# Patient Record
Sex: Female | Born: 1955 | Race: Black or African American | Hispanic: No | Marital: Single | Smoking: Former smoker
Health system: Southern US, Community
[De-identification: ages and names within clinical notes are randomized; demographics above are authoritative.]

## PROBLEM LIST (undated history)

## (undated) DIAGNOSIS — Z21 Asymptomatic human immunodeficiency virus [HIV] infection status: Secondary | ICD-10-CM

## (undated) DIAGNOSIS — F1911 Other psychoactive substance abuse, in remission: Secondary | ICD-10-CM

## (undated) DIAGNOSIS — F32A Depression, unspecified: Secondary | ICD-10-CM

## (undated) DIAGNOSIS — K759 Inflammatory liver disease, unspecified: Secondary | ICD-10-CM

## (undated) DIAGNOSIS — F419 Anxiety disorder, unspecified: Secondary | ICD-10-CM

## (undated) DIAGNOSIS — A599 Trichomoniasis, unspecified: Secondary | ICD-10-CM

## (undated) DIAGNOSIS — G629 Polyneuropathy, unspecified: Secondary | ICD-10-CM

## (undated) DIAGNOSIS — Z8659 Personal history of other mental and behavioral disorders: Secondary | ICD-10-CM

## (undated) DIAGNOSIS — H6123 Impacted cerumen, bilateral: Secondary | ICD-10-CM

## (undated) DIAGNOSIS — F329 Major depressive disorder, single episode, unspecified: Secondary | ICD-10-CM

## (undated) DIAGNOSIS — J40 Bronchitis, not specified as acute or chronic: Secondary | ICD-10-CM

## (undated) DIAGNOSIS — G479 Sleep disorder, unspecified: Secondary | ICD-10-CM

## (undated) DIAGNOSIS — I10 Essential (primary) hypertension: Secondary | ICD-10-CM

## (undated) DIAGNOSIS — B20 Human immunodeficiency virus [HIV] disease: Secondary | ICD-10-CM

## (undated) HISTORY — DX: Human immunodeficiency virus (HIV) disease: B20

## (undated) HISTORY — DX: Asymptomatic human immunodeficiency virus (hiv) infection status: Z21

## (undated) HISTORY — DX: Depression, unspecified: F32.A

## (undated) HISTORY — DX: Bronchitis, not specified as acute or chronic: J40

## (undated) HISTORY — DX: Sleep disorder, unspecified: G47.9

## (undated) HISTORY — DX: Essential (primary) hypertension: I10

## (undated) HISTORY — DX: Anxiety disorder, unspecified: F41.9

## (undated) HISTORY — DX: Other psychoactive substance abuse, in remission: F19.11

## (undated) HISTORY — DX: Major depressive disorder, single episode, unspecified: F32.9

## (undated) HISTORY — DX: Personal history of other mental and behavioral disorders: Z86.59

## (undated) HISTORY — DX: Trichomoniasis, unspecified: A59.9

## (undated) HISTORY — DX: Polyneuropathy, unspecified: G62.9

---
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
## 2001-02-14 ENCOUNTER — Emergency Department (HOSPITAL_COMMUNITY): Admission: EM | Admit: 2001-02-14 | Discharge: 2001-02-14 | Payer: Self-pay | Admitting: Emergency Medicine

## 2003-08-11 ENCOUNTER — Inpatient Hospital Stay (HOSPITAL_COMMUNITY): Admission: AD | Admit: 2003-08-11 | Discharge: 2003-08-15 | Payer: Self-pay | Admitting: *Deleted

## 2003-08-23 ENCOUNTER — Encounter: Admission: RE | Admit: 2003-08-23 | Discharge: 2003-08-23 | Payer: Self-pay | Admitting: Internal Medicine

## 2003-08-24 ENCOUNTER — Encounter: Admission: RE | Admit: 2003-08-24 | Discharge: 2003-08-24 | Payer: Self-pay | Admitting: Internal Medicine

## 2003-10-24 ENCOUNTER — Ambulatory Visit: Payer: Self-pay

## 2003-11-16 ENCOUNTER — Ambulatory Visit: Payer: Self-pay | Admitting: Infectious Diseases

## 2003-12-27 ENCOUNTER — Ambulatory Visit (HOSPITAL_COMMUNITY): Admission: RE | Admit: 2003-12-27 | Discharge: 2003-12-27 | Payer: Self-pay | Admitting: Infectious Diseases

## 2003-12-27 ENCOUNTER — Ambulatory Visit: Payer: Self-pay | Admitting: Infectious Diseases

## 2004-05-31 ENCOUNTER — Emergency Department (HOSPITAL_COMMUNITY): Admission: EM | Admit: 2004-05-31 | Discharge: 2004-05-31 | Payer: Self-pay | Admitting: Emergency Medicine

## 2004-06-23 ENCOUNTER — Ambulatory Visit (HOSPITAL_COMMUNITY): Admission: RE | Admit: 2004-06-23 | Discharge: 2004-06-23 | Payer: Self-pay | Admitting: Infectious Diseases

## 2004-06-23 ENCOUNTER — Encounter (INDEPENDENT_AMBULATORY_CARE_PROVIDER_SITE_OTHER): Payer: Self-pay | Admitting: *Deleted

## 2004-06-23 ENCOUNTER — Ambulatory Visit: Payer: Self-pay | Admitting: Infectious Diseases

## 2004-06-23 LAB — CONVERTED CEMR LAB
CD4 Count: 70 microliters
CD4 T Cell Abs: 70

## 2004-07-17 ENCOUNTER — Emergency Department (HOSPITAL_COMMUNITY): Admission: EM | Admit: 2004-07-17 | Discharge: 2004-07-17 | Payer: Self-pay | Admitting: Family Medicine

## 2004-09-20 ENCOUNTER — Emergency Department (HOSPITAL_COMMUNITY): Admission: EM | Admit: 2004-09-20 | Discharge: 2004-09-20 | Payer: Self-pay | Admitting: Family Medicine

## 2005-01-12 ENCOUNTER — Ambulatory Visit: Payer: Self-pay | Admitting: Infectious Diseases

## 2005-01-12 ENCOUNTER — Encounter (INDEPENDENT_AMBULATORY_CARE_PROVIDER_SITE_OTHER): Payer: Self-pay | Admitting: *Deleted

## 2005-01-12 ENCOUNTER — Ambulatory Visit (HOSPITAL_COMMUNITY): Admission: RE | Admit: 2005-01-12 | Discharge: 2005-01-12 | Payer: Self-pay | Admitting: Infectious Diseases

## 2005-01-12 LAB — CONVERTED CEMR LAB
CD4 Count: 170 microliters
HIV 1 RNA Quant: 399 copies/mL

## 2005-10-09 ENCOUNTER — Emergency Department (HOSPITAL_COMMUNITY): Admission: EM | Admit: 2005-10-09 | Discharge: 2005-10-09 | Payer: Self-pay | Admitting: Family Medicine

## 2005-12-23 DIAGNOSIS — I10 Essential (primary) hypertension: Secondary | ICD-10-CM

## 2005-12-23 DIAGNOSIS — Z8709 Personal history of other diseases of the respiratory system: Secondary | ICD-10-CM | POA: Insufficient documentation

## 2005-12-23 DIAGNOSIS — B20 Human immunodeficiency virus [HIV] disease: Secondary | ICD-10-CM | POA: Insufficient documentation

## 2005-12-23 DIAGNOSIS — B182 Chronic viral hepatitis C: Secondary | ICD-10-CM | POA: Insufficient documentation

## 2005-12-28 ENCOUNTER — Emergency Department (HOSPITAL_COMMUNITY): Admission: EM | Admit: 2005-12-28 | Discharge: 2005-12-29 | Payer: Self-pay | Admitting: Emergency Medicine

## 2006-04-12 ENCOUNTER — Encounter (INDEPENDENT_AMBULATORY_CARE_PROVIDER_SITE_OTHER): Payer: Self-pay | Admitting: *Deleted

## 2006-04-12 LAB — CONVERTED CEMR LAB

## 2006-04-25 ENCOUNTER — Encounter (INDEPENDENT_AMBULATORY_CARE_PROVIDER_SITE_OTHER): Payer: Self-pay | Admitting: *Deleted

## 2006-12-31 ENCOUNTER — Ambulatory Visit: Payer: Self-pay | Admitting: Internal Medicine

## 2006-12-31 ENCOUNTER — Inpatient Hospital Stay (HOSPITAL_COMMUNITY): Admission: EM | Admit: 2006-12-31 | Discharge: 2007-01-02 | Payer: Self-pay | Admitting: Emergency Medicine

## 2006-12-31 ENCOUNTER — Ambulatory Visit: Payer: Self-pay | Admitting: Emergency Medicine

## 2007-01-03 ENCOUNTER — Ambulatory Visit: Payer: Self-pay | Admitting: Infectious Diseases

## 2007-01-03 ENCOUNTER — Encounter: Admission: RE | Admit: 2007-01-03 | Discharge: 2007-01-03 | Payer: Self-pay | Admitting: Infectious Diseases

## 2007-01-03 LAB — CONVERTED CEMR LAB
BUN: 27 mg/dL — ABNORMAL HIGH (ref 6–23)
Basophils Absolute: 0 10*3/uL (ref 0.0–0.1)
Basophils Relative: 1 % (ref 0–1)
CO2: 14 meq/L — ABNORMAL LOW (ref 19–32)
Calcium: 8.9 mg/dL (ref 8.4–10.5)
Chloride: 115 meq/L — ABNORMAL HIGH (ref 96–112)
Creatinine, Ser: 1.11 mg/dL (ref 0.40–1.20)
Eosinophils Absolute: 0.1 10*3/uL — ABNORMAL LOW (ref 0.2–0.7)
Eosinophils Relative: 3 % (ref 0–5)
Glucose, Bld: 85 mg/dL (ref 70–99)
HCT: 25.3 % — ABNORMAL LOW (ref 36.0–46.0)
HCV Ab: POSITIVE
Hemoglobin: 8.5 g/dL — ABNORMAL LOW (ref 12.0–15.0)
Lymphocytes Relative: 23 % (ref 12–46)
Lymphs Abs: 1 10*3/uL (ref 0.7–4.0)
MCHC: 33.6 g/dL (ref 30.0–36.0)
MCV: 95.5 fL (ref 78.0–100.0)
Monocytes Absolute: 0.7 10*3/uL (ref 0.1–1.0)
Monocytes Relative: 16 % — ABNORMAL HIGH (ref 3–12)
Neutro Abs: 2.4 10*3/uL (ref 1.7–7.7)
Neutrophils Relative %: 58 % (ref 43–77)
Platelets: 284 10*3/uL (ref 150–400)
Potassium: 4.3 meq/L (ref 3.5–5.3)
RBC: 2.65 M/uL — ABNORMAL LOW (ref 3.87–5.11)
RDW: 22 % — ABNORMAL HIGH (ref 11.5–15.5)
Sodium: 140 meq/L (ref 135–145)
WBC: 4.2 10*3/uL (ref 4.0–10.5)

## 2007-01-04 ENCOUNTER — Encounter (INDEPENDENT_AMBULATORY_CARE_PROVIDER_SITE_OTHER): Payer: Self-pay | Admitting: *Deleted

## 2007-01-04 ENCOUNTER — Telehealth: Payer: Self-pay | Admitting: Infectious Diseases

## 2007-01-04 DIAGNOSIS — D649 Anemia, unspecified: Secondary | ICD-10-CM | POA: Insufficient documentation

## 2007-01-31 ENCOUNTER — Encounter (INDEPENDENT_AMBULATORY_CARE_PROVIDER_SITE_OTHER): Payer: Self-pay | Admitting: *Deleted

## 2007-02-02 ENCOUNTER — Ambulatory Visit: Payer: Self-pay | Admitting: Infectious Diseases

## 2007-02-14 ENCOUNTER — Encounter (INDEPENDENT_AMBULATORY_CARE_PROVIDER_SITE_OTHER): Payer: Self-pay | Admitting: *Deleted

## 2007-02-15 ENCOUNTER — Encounter (INDEPENDENT_AMBULATORY_CARE_PROVIDER_SITE_OTHER): Payer: Self-pay | Admitting: *Deleted

## 2007-02-16 ENCOUNTER — Telehealth: Payer: Self-pay | Admitting: Infectious Diseases

## 2007-02-16 ENCOUNTER — Ambulatory Visit: Payer: Self-pay | Admitting: Internal Medicine

## 2007-02-16 DIAGNOSIS — L659 Nonscarring hair loss, unspecified: Secondary | ICD-10-CM | POA: Insufficient documentation

## 2007-02-16 DIAGNOSIS — L299 Pruritus, unspecified: Secondary | ICD-10-CM | POA: Insufficient documentation

## 2007-02-21 ENCOUNTER — Encounter: Payer: Self-pay | Admitting: Internal Medicine

## 2007-02-21 LAB — CONVERTED CEMR LAB
Basophils Absolute: 0 10*3/uL (ref 0.0–0.1)
Basophils Relative: 1 % (ref 0–1)
Eosinophils Absolute: 0.1 10*3/uL (ref 0.0–0.7)
Eosinophils Relative: 2 % (ref 0–5)
HCT: 32.9 % — ABNORMAL LOW (ref 36.0–46.0)
Hemoglobin: 11 g/dL — ABNORMAL LOW (ref 12.0–15.0)
Lymphocytes Relative: 24 % (ref 12–46)
Lymphs Abs: 1.3 10*3/uL (ref 0.7–4.0)
MCHC: 33.4 g/dL (ref 30.0–36.0)
MCV: 95.6 fL (ref 78.0–100.0)
Monocytes Absolute: 0.7 10*3/uL (ref 0.1–1.0)
Monocytes Relative: 13 % — ABNORMAL HIGH (ref 3–12)
Neutro Abs: 3.3 10*3/uL (ref 1.7–7.7)
Neutrophils Relative %: 61 % (ref 43–77)
Platelets: 202 10*3/uL (ref 150–400)
RBC: 3.44 M/uL — ABNORMAL LOW (ref 3.87–5.11)
RDW: 14.3 % (ref 11.5–15.5)
TSH: 2.103 microintl units/mL (ref 0.350–5.50)
WBC: 5.4 10*3/uL (ref 4.0–10.5)

## 2007-03-21 ENCOUNTER — Ambulatory Visit: Payer: Self-pay | Admitting: Infectious Diseases

## 2007-03-21 ENCOUNTER — Encounter: Admission: RE | Admit: 2007-03-21 | Discharge: 2007-03-21 | Payer: Self-pay | Admitting: Infectious Diseases

## 2007-03-21 LAB — CONVERTED CEMR LAB
ALT: 48 units/L — ABNORMAL HIGH (ref 0–35)
AST: 47 units/L — ABNORMAL HIGH (ref 0–37)
Albumin: 3.7 g/dL (ref 3.5–5.2)
Alkaline Phosphatase: 141 units/L — ABNORMAL HIGH (ref 39–117)
BUN: 38 mg/dL — ABNORMAL HIGH (ref 6–23)
CO2: 16 meq/L — ABNORMAL LOW (ref 19–32)
Calcium: 9.1 mg/dL (ref 8.4–10.5)
Chloride: 111 meq/L (ref 96–112)
Cholesterol: 183 mg/dL (ref 0–200)
Creatinine, Ser: 1.4 mg/dL — ABNORMAL HIGH (ref 0.40–1.20)
Glucose, Bld: 105 mg/dL — ABNORMAL HIGH (ref 70–99)
HCT: 28.9 % — ABNORMAL LOW (ref 36.0–46.0)
HDL: 61 mg/dL (ref >39)
HIV 1 RNA Quant: 430 copies/mL — ABNORMAL HIGH (ref <50)
HIV-1 RNA Quant, Log: 2.63 — ABNORMAL HIGH (ref <1.70)
Hemoglobin: 9.3 g/dL — ABNORMAL LOW (ref 12.0–15.0)
Hep A Total Ab: POSITIVE — AB
LDL Cholesterol: 75 mg/dL (ref 0–99)
MCHC: 32.2 g/dL (ref 30.0–36.0)
MCV: 92.9 fL (ref 78.0–100.0)
Platelets: 238 10*3/uL (ref 150–400)
Potassium: 4.6 meq/L (ref 3.5–5.3)
RBC: 3.11 M/uL — ABNORMAL LOW (ref 3.87–5.11)
RDW: 14.2 % (ref 11.5–15.5)
Sodium: 137 meq/L (ref 135–145)
Total Bilirubin: 0.5 mg/dL (ref 0.3–1.2)
Total CHOL/HDL Ratio: 3
Total Protein: 7.5 g/dL (ref 6.0–8.3)
Triglycerides: 234 mg/dL — ABNORMAL HIGH (ref <150)
VLDL: 47 mg/dL — ABNORMAL HIGH (ref 0–40)
WBC: 4.4 10*3/uL (ref 4.0–10.5)

## 2007-04-08 ENCOUNTER — Encounter (INDEPENDENT_AMBULATORY_CARE_PROVIDER_SITE_OTHER): Payer: Self-pay | Admitting: *Deleted

## 2007-06-06 ENCOUNTER — Ambulatory Visit: Payer: Self-pay | Admitting: Infectious Diseases

## 2007-06-06 ENCOUNTER — Encounter: Admission: RE | Admit: 2007-06-06 | Discharge: 2007-06-06 | Payer: Self-pay | Admitting: Infectious Diseases

## 2007-06-06 LAB — CONVERTED CEMR LAB
ALT: 52 units/L — ABNORMAL HIGH (ref 0–35)
AST: 61 units/L — ABNORMAL HIGH (ref 0–37)
Albumin: 3.9 g/dL (ref 3.5–5.2)
Alkaline Phosphatase: 156 units/L — ABNORMAL HIGH (ref 39–117)
BUN: 41 mg/dL — ABNORMAL HIGH (ref 6–23)
Basophils Absolute: 0 10*3/uL (ref 0.0–0.1)
Basophils Relative: 1 % (ref 0–1)
CO2: 12 meq/L — ABNORMAL LOW (ref 19–32)
Calcium: 8.7 mg/dL (ref 8.4–10.5)
Chloride: 111 meq/L (ref 96–112)
Creatinine, Ser: 1.62 mg/dL — ABNORMAL HIGH (ref 0.40–1.20)
Eosinophils Absolute: 0.1 10*3/uL (ref 0.0–0.7)
Eosinophils Relative: 1 % (ref 0–5)
Glucose, Bld: 139 mg/dL — ABNORMAL HIGH (ref 70–99)
HCT: 34.3 % — ABNORMAL LOW (ref 36.0–46.0)
HIV 1 RNA Quant: <50 copies/mL (ref <50)
HIV-1 RNA Quant, Log: <1.7 (ref <1.70)
Hemoglobin: 11.3 g/dL — ABNORMAL LOW (ref 12.0–15.0)
Lymphocytes Relative: 21 % (ref 12–46)
Lymphs Abs: 1.3 10*3/uL (ref 0.7–4.0)
MCHC: 32.9 g/dL (ref 30.0–36.0)
MCV: 93 fL (ref 78.0–100.0)
Monocytes Absolute: 0.5 10*3/uL (ref 0.1–1.0)
Monocytes Relative: 9 % (ref 3–12)
Neutro Abs: 4.1 10*3/uL (ref 1.7–7.7)
Neutrophils Relative %: 68 % (ref 43–77)
Platelets: 266 10*3/uL (ref 150–400)
Potassium: 4.3 meq/L (ref 3.5–5.3)
RBC: 3.69 M/uL — ABNORMAL LOW (ref 3.87–5.11)
RDW: 15.6 % — ABNORMAL HIGH (ref 11.5–15.5)
Sodium: 135 meq/L (ref 135–145)
Total Bilirubin: 0.4 mg/dL (ref 0.3–1.2)
Total Protein: 7.6 g/dL (ref 6.0–8.3)
WBC: 6 10*3/uL (ref 4.0–10.5)

## 2007-06-21 ENCOUNTER — Ambulatory Visit: Payer: Self-pay | Admitting: Infectious Diseases

## 2007-06-21 DIAGNOSIS — N898 Other specified noninflammatory disorders of vagina: Secondary | ICD-10-CM | POA: Insufficient documentation

## 2007-07-05 ENCOUNTER — Encounter: Payer: Self-pay | Admitting: Infectious Diseases

## 2007-07-18 ENCOUNTER — Encounter: Payer: Self-pay | Admitting: Infectious Diseases

## 2007-09-26 ENCOUNTER — Encounter: Admission: RE | Admit: 2007-09-26 | Discharge: 2007-09-26 | Payer: Self-pay | Admitting: Infectious Diseases

## 2007-09-26 ENCOUNTER — Ambulatory Visit: Payer: Self-pay | Admitting: Infectious Diseases

## 2007-09-26 LAB — CONVERTED CEMR LAB
ALT: 45 units/L — ABNORMAL HIGH (ref 0–35)
AST: 58 units/L — ABNORMAL HIGH (ref 0–37)
Albumin: 4 g/dL (ref 3.5–5.2)
Alkaline Phosphatase: 197 units/L — ABNORMAL HIGH (ref 39–117)
BUN: 19 mg/dL (ref 6–23)
CO2: 14 meq/L — ABNORMAL LOW (ref 19–32)
Calcium: 9.5 mg/dL (ref 8.4–10.5)
Chloride: 109 meq/L (ref 96–112)
Creatinine, Ser: 1.16 mg/dL (ref 0.40–1.20)
Glucose, Bld: 85 mg/dL (ref 70–99)
HCT: 38.9 % (ref 36.0–46.0)
HIV 1 RNA Quant: 151 {copies}/mL — ABNORMAL HIGH
HIV-1 RNA Quant, Log: 2.18 — ABNORMAL HIGH
Hemoglobin: 13 g/dL (ref 12.0–15.0)
MCHC: 33.4 g/dL (ref 30.0–36.0)
MCV: 100 fL (ref 78.0–100.0)
Platelets: 248 10*3/uL (ref 150–400)
Potassium: 3.9 meq/L (ref 3.5–5.3)
RBC: 3.89 M/uL (ref 3.87–5.11)
RDW: 12.7 % (ref 11.5–15.5)
Sodium: 137 meq/L (ref 135–145)
Total Bilirubin: 0.6 mg/dL (ref 0.3–1.2)
Total Protein: 8.1 g/dL (ref 6.0–8.3)
WBC: 6.8 10*3/uL (ref 4.0–10.5)

## 2007-11-28 ENCOUNTER — Telehealth: Payer: Self-pay | Admitting: Internal Medicine

## 2007-11-30 ENCOUNTER — Ambulatory Visit: Payer: Self-pay | Admitting: Infectious Diseases

## 2007-11-30 LAB — CONVERTED CEMR LAB
ALT: 40 units/L — ABNORMAL HIGH (ref 0–35)
AST: 50 units/L — ABNORMAL HIGH (ref 0–37)
Albumin: 4.2 g/dL (ref 3.5–5.2)
Alkaline Phosphatase: 177 units/L — ABNORMAL HIGH (ref 39–117)
BUN: 23 mg/dL (ref 6–23)
CO2: 16 meq/L — ABNORMAL LOW (ref 19–32)
Calcium: 9.4 mg/dL (ref 8.4–10.5)
Chloride: 106 meq/L (ref 96–112)
Creatinine, Ser: 1.14 mg/dL (ref 0.40–1.20)
Glucose, Bld: 104 mg/dL — ABNORMAL HIGH (ref 70–99)
HIV 1 RNA Quant: 182 copies/mL — ABNORMAL HIGH (ref <50)
HIV-1 RNA Quant, Log: 2.26 — ABNORMAL HIGH (ref <1.70)
Potassium: 4.3 meq/L (ref 3.5–5.3)
Sodium: 138 meq/L (ref 135–145)
Total Bilirubin: 0.5 mg/dL (ref 0.3–1.2)
Total Protein: 8 g/dL (ref 6.0–8.3)

## 2007-12-12 ENCOUNTER — Ambulatory Visit: Payer: Self-pay | Admitting: Infectious Diseases

## 2007-12-12 DIAGNOSIS — M549 Dorsalgia, unspecified: Secondary | ICD-10-CM

## 2007-12-12 DIAGNOSIS — G8929 Other chronic pain: Secondary | ICD-10-CM

## 2007-12-12 LAB — CONVERTED CEMR LAB
Bilirubin Urine: NEGATIVE
Candida species: NEGATIVE
Chlamydia, Swab/Urine, PCR: NEGATIVE
GC Probe Amp, Urine: NEGATIVE
Gardnerella vaginalis: NEGATIVE
Hemoglobin, Urine: NEGATIVE
Ketones, ur: NEGATIVE mg/dL
Nitrite: NEGATIVE
Protein, ur: 30 mg/dL — AB
RBC / HPF: NONE SEEN (ref <3)
Specific Gravity, Urine: 1.023 (ref 1.005–1.03)
Trichomonal Vaginitis: POSITIVE — AB
Urine Glucose: NEGATIVE mg/dL
Urobilinogen, UA: 1 (ref 0.0–1.0)
pH: 6 (ref 5.0–8.0)

## 2007-12-14 ENCOUNTER — Telehealth: Payer: Self-pay | Admitting: Infectious Diseases

## 2007-12-15 ENCOUNTER — Telehealth: Payer: Self-pay | Admitting: Infectious Diseases

## 2007-12-20 ENCOUNTER — Ambulatory Visit (HOSPITAL_COMMUNITY): Admission: RE | Admit: 2007-12-20 | Discharge: 2007-12-20 | Payer: Self-pay | Admitting: Infectious Diseases

## 2007-12-29 ENCOUNTER — Telehealth (INDEPENDENT_AMBULATORY_CARE_PROVIDER_SITE_OTHER): Payer: Self-pay | Admitting: *Deleted

## 2007-12-30 ENCOUNTER — Ambulatory Visit: Payer: Self-pay | Admitting: Internal Medicine

## 2007-12-30 DIAGNOSIS — J209 Acute bronchitis, unspecified: Secondary | ICD-10-CM

## 2008-01-16 ENCOUNTER — Encounter: Payer: Self-pay | Admitting: Licensed Clinical Social Worker

## 2008-01-16 ENCOUNTER — Ambulatory Visit: Payer: Self-pay | Admitting: Infectious Diseases

## 2008-02-17 HISTORY — PX: COLPOSCOPY: SHX161

## 2008-02-21 IMAGING — CT CT HEAD W/O CM
1 of 2 series · 13 of 30 positions shown, 17 images · IV contrast (agent unspecified)
Comparison: None.

CLINICAL DATA: 50-year-old with headache and nosebleeds.
 HEAD CT WITHOUT CONTRAST:
TECHNIQUE: Contiguous axial images were obtained from the base of the skull through the vertex according to standard protocol without contrast.

[Series 2: brain · axial · 0.47mm/px · z∈[+136,+258]mm · 13 of 28 slices shown, 17 images]
[im 2/28  brain]
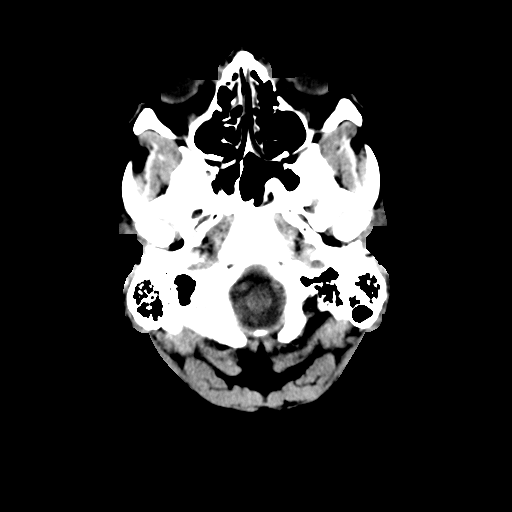
[im 2/28  bone]
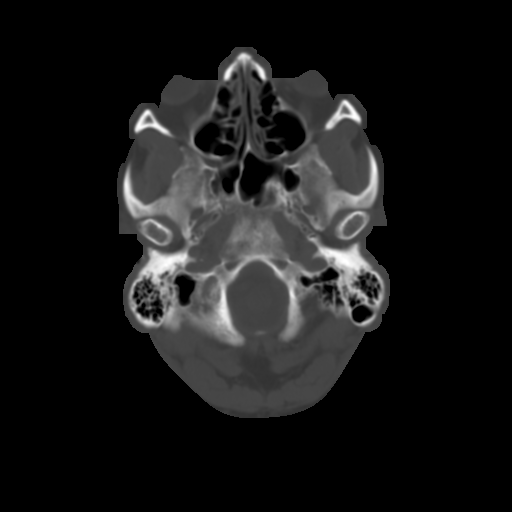
[im 4/28  brain]
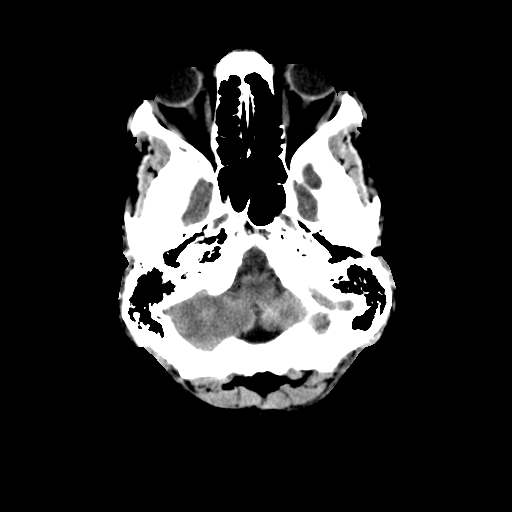
[im 6/28  brain]
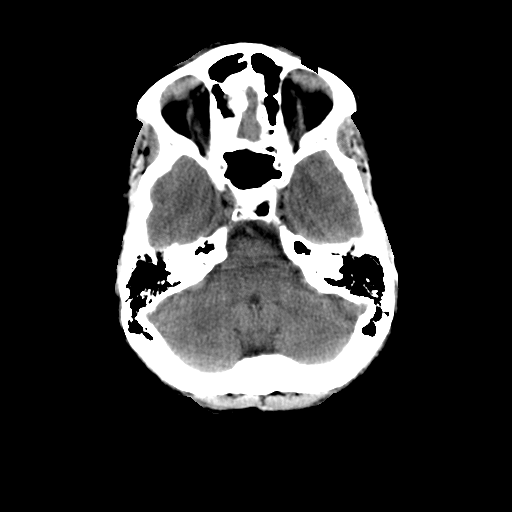
[im 8/28  brain]
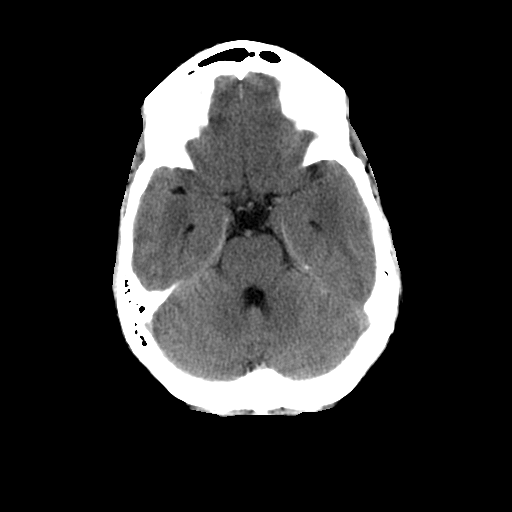
[im 10/28  brain]
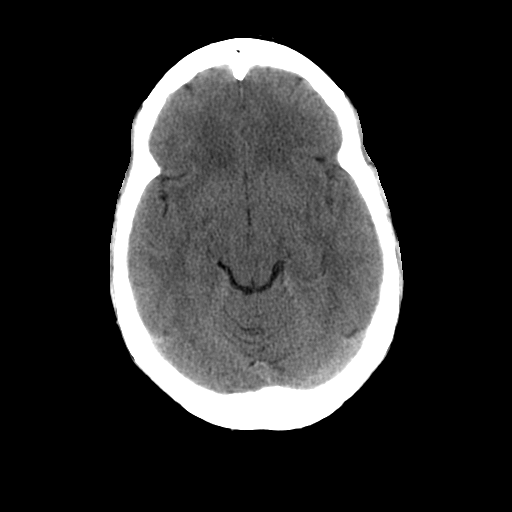
[im 10/28  bone]
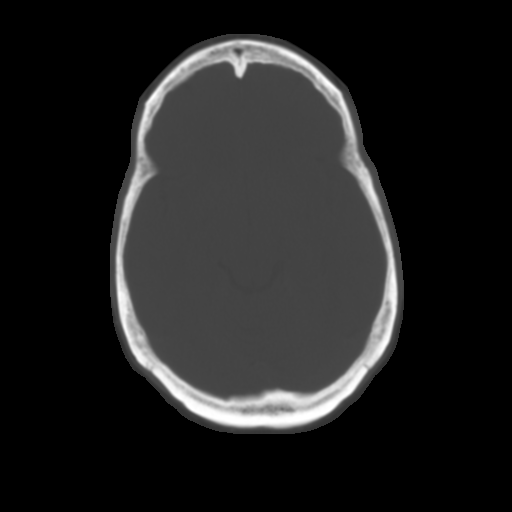
[im 12/28  brain]
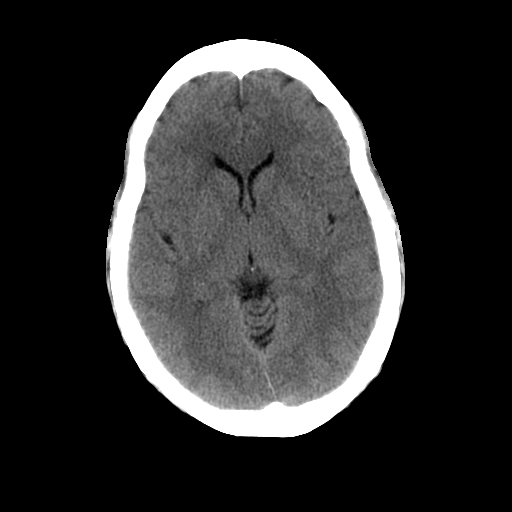
[im 14/28  brain]
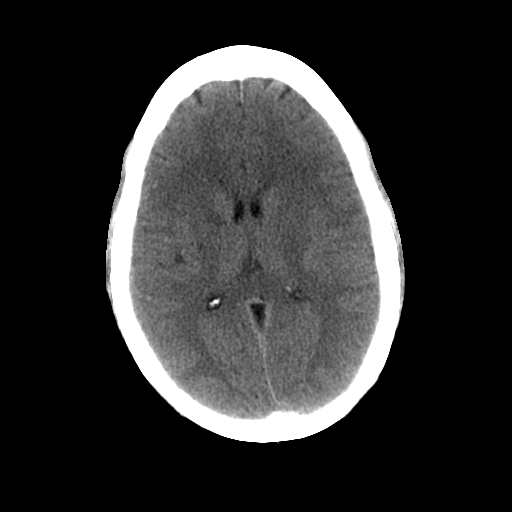
[im 16/28  brain]
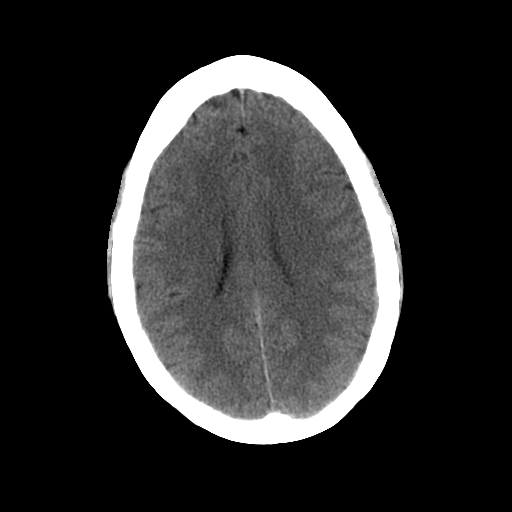
[im 18/28  brain]
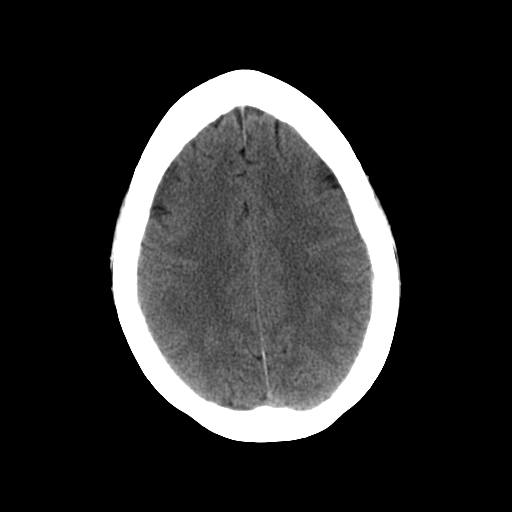
[im 18/28  bone]
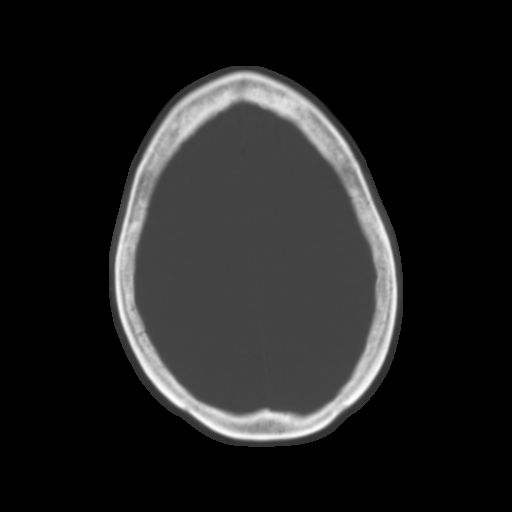
[im 20/28  brain]
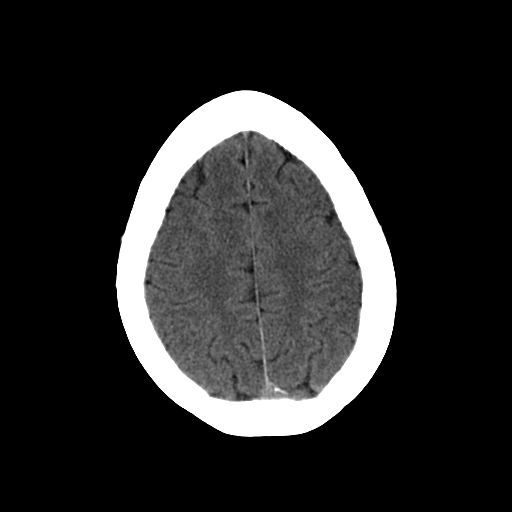
[im 22/28  brain]
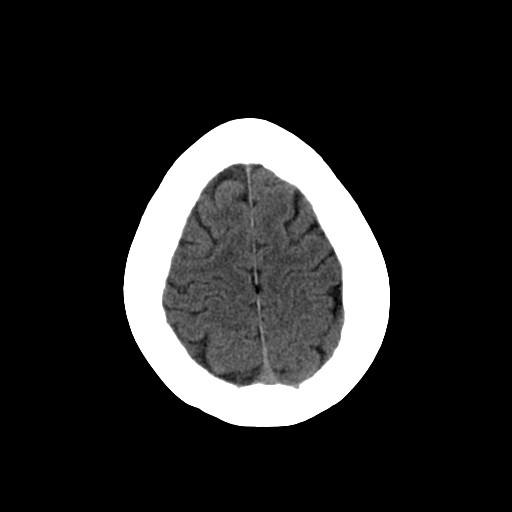
[im 24/28  brain]
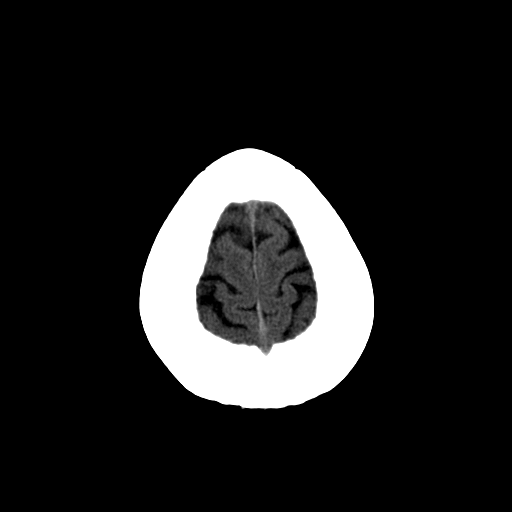
[im 26/28  brain]
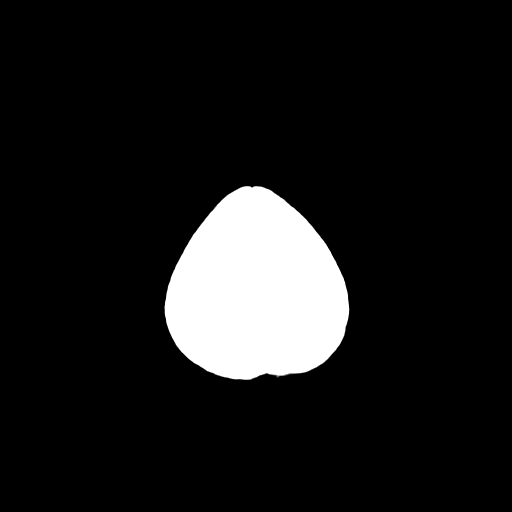
[im 26/28  bone]
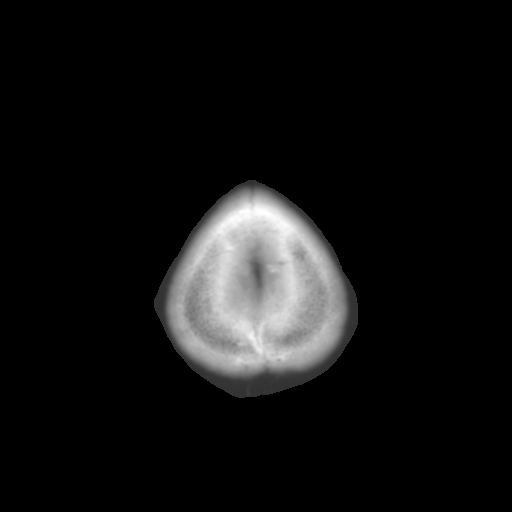

[13 of 30 positions shown; findings below may reference images not displayed]

FINDINGS: There is no evidence of intracranial hemorrhage, brain edema, acute infarct, mass lesion, or mass effect.  No other intra-axial abnormalities are seen, and the ventricles are within normal limits.  No abnormal extra-axial fluid collections or masses are identified.  No skull abnormalities are noted.
IMPRESSION: Negative non-contrast head CT.

## 2008-04-10 ENCOUNTER — Telehealth: Payer: Self-pay | Admitting: Infectious Diseases

## 2008-04-10 ENCOUNTER — Ambulatory Visit: Payer: Self-pay | Admitting: Infectious Diseases

## 2008-04-16 ENCOUNTER — Encounter (INDEPENDENT_AMBULATORY_CARE_PROVIDER_SITE_OTHER): Payer: Self-pay | Admitting: *Deleted

## 2008-04-16 ENCOUNTER — Ambulatory Visit: Payer: Self-pay | Admitting: Infectious Diseases

## 2008-04-16 DIAGNOSIS — K029 Dental caries, unspecified: Secondary | ICD-10-CM | POA: Insufficient documentation

## 2008-05-09 ENCOUNTER — Encounter (INDEPENDENT_AMBULATORY_CARE_PROVIDER_SITE_OTHER): Payer: Self-pay | Admitting: *Deleted

## 2008-11-30 ENCOUNTER — Ambulatory Visit: Payer: Self-pay | Admitting: Internal Medicine

## 2008-11-30 ENCOUNTER — Encounter: Payer: Self-pay | Admitting: Infectious Diseases

## 2008-12-10 ENCOUNTER — Telehealth: Payer: Self-pay | Admitting: Infectious Diseases

## 2008-12-10 ENCOUNTER — Ambulatory Visit: Payer: Self-pay | Admitting: Infectious Diseases

## 2008-12-10 LAB — CONVERTED CEMR LAB
ALT: 58 units/L — ABNORMAL HIGH (ref 0–35)
AST: 87 units/L — ABNORMAL HIGH (ref 0–37)
Albumin: 3.6 g/dL (ref 3.5–5.2)
Alkaline Phosphatase: 140 units/L — ABNORMAL HIGH (ref 39–117)
BUN: 24 mg/dL — ABNORMAL HIGH (ref 6–23)
Basophils Absolute: 0 10*3/uL (ref 0.0–0.1)
Basophils Relative: 1 % (ref 0–1)
CO2: 18 meq/L — ABNORMAL LOW (ref 19–32)
Calcium: 9.2 mg/dL (ref 8.4–10.5)
Chloride: 113 meq/L — ABNORMAL HIGH (ref 96–112)
Cholesterol: 156 mg/dL (ref 0–200)
Creatinine, Ser: 1.07 mg/dL (ref 0.40–1.20)
Eosinophils Absolute: 0.1 10*3/uL (ref 0.0–0.7)
Eosinophils Relative: 2 % (ref 0–5)
Glucose, Bld: 116 mg/dL — ABNORMAL HIGH (ref 70–99)
HCT: 36.8 % (ref 36.0–46.0)
HDL: 50 mg/dL (ref >39)
HIV 1 RNA Quant: 480 copies/mL — ABNORMAL HIGH (ref <48)
HIV-1 RNA Quant, Log: 2.68 — ABNORMAL HIGH (ref <1.68)
Hemoglobin: 12.2 g/dL (ref 12.0–15.0)
LDL Cholesterol: 49 mg/dL (ref 0–99)
Lymphocytes Relative: 24 % (ref 12–46)
Lymphs Abs: 1.4 10*3/uL (ref 0.7–4.0)
MCHC: 33.2 g/dL (ref 30.0–36.0)
MCV: 100.8 fL — ABNORMAL HIGH (ref >=78.0)
Monocytes Absolute: 0.6 10*3/uL (ref 0.1–1.0)
Monocytes Relative: 10 % (ref 3–12)
Neutro Abs: 3.7 10*3/uL (ref 1.7–7.7)
Neutrophils Relative %: 64 % (ref 43–77)
Platelets: 216 10*3/uL (ref 150–400)
Potassium: 4.4 meq/L (ref 3.5–5.3)
RBC: 3.65 M/uL — ABNORMAL LOW (ref 3.87–5.11)
RDW: 12.6 % (ref 11.5–15.5)
Sodium: 141 meq/L (ref 135–145)
Total Bilirubin: 0.4 mg/dL (ref 0.3–1.2)
Total CHOL/HDL Ratio: 3.1
Total Protein: 6.9 g/dL (ref 6.0–8.3)
Triglycerides: 287 mg/dL — ABNORMAL HIGH (ref <150)
VLDL: 57 mg/dL — ABNORMAL HIGH (ref 0–40)
WBC: 5.8 10*3/uL (ref 4.0–10.5)

## 2008-12-14 ENCOUNTER — Encounter: Payer: Self-pay | Admitting: Infectious Diseases

## 2009-01-03 ENCOUNTER — Telehealth (INDEPENDENT_AMBULATORY_CARE_PROVIDER_SITE_OTHER): Payer: Self-pay | Admitting: *Deleted

## 2009-01-15 ENCOUNTER — Ambulatory Visit: Payer: Self-pay | Admitting: Infectious Diseases

## 2009-01-15 DIAGNOSIS — R87619 Unspecified abnormal cytological findings in specimens from cervix uteri: Secondary | ICD-10-CM | POA: Insufficient documentation

## 2009-01-15 DIAGNOSIS — G4700 Insomnia, unspecified: Secondary | ICD-10-CM | POA: Insufficient documentation

## 2009-01-17 ENCOUNTER — Encounter (INDEPENDENT_AMBULATORY_CARE_PROVIDER_SITE_OTHER): Payer: Self-pay | Admitting: *Deleted

## 2009-01-17 ENCOUNTER — Encounter: Payer: Self-pay | Admitting: Infectious Diseases

## 2009-01-17 ENCOUNTER — Ambulatory Visit: Payer: Self-pay | Admitting: Obstetrics & Gynecology

## 2009-01-17 ENCOUNTER — Other Ambulatory Visit: Admission: RE | Admit: 2009-01-17 | Discharge: 2009-01-17 | Payer: Self-pay | Admitting: Obstetrics & Gynecology

## 2009-01-17 LAB — CONVERTED CEMR LAB

## 2009-01-24 ENCOUNTER — Encounter: Payer: Self-pay | Admitting: Infectious Diseases

## 2009-01-30 ENCOUNTER — Telehealth (INDEPENDENT_AMBULATORY_CARE_PROVIDER_SITE_OTHER): Payer: Self-pay | Admitting: *Deleted

## 2009-02-20 ENCOUNTER — Telehealth: Payer: Self-pay | Admitting: Infectious Diseases

## 2009-03-07 ENCOUNTER — Ambulatory Visit: Payer: Self-pay | Admitting: Infectious Diseases

## 2009-03-07 LAB — CONVERTED CEMR LAB
ALT: 42 units/L — ABNORMAL HIGH (ref 0–35)
AST: 53 units/L — ABNORMAL HIGH (ref 0–37)
Albumin: 4.2 g/dL (ref 3.5–5.2)
Alkaline Phosphatase: 157 units/L — ABNORMAL HIGH (ref 39–117)
BUN: 23 mg/dL (ref 6–23)
Basophils Absolute: 0.1 10*3/uL (ref 0.0–0.1)
Basophils Relative: 1 % (ref 0–1)
CO2: 18 meq/L — ABNORMAL LOW (ref 19–32)
Calcium: 9 mg/dL (ref 8.4–10.5)
Chloride: 106 meq/L (ref 96–112)
Creatinine, Ser: 1.14 mg/dL (ref 0.40–1.20)
Eosinophils Absolute: 0 10*3/uL (ref 0.0–0.7)
Eosinophils Relative: 0 % (ref 0–5)
Glucose, Bld: 117 mg/dL — ABNORMAL HIGH (ref 70–99)
HCT: 40.5 % (ref 36.0–46.0)
HIV 1 RNA Quant: 239 copies/mL — ABNORMAL HIGH (ref <48)
HIV-1 RNA Quant, Log: 2.38 — ABNORMAL HIGH (ref <1.68)
Hemoglobin: 13.7 g/dL (ref 12.0–15.0)
Lymphocytes Relative: 16 % (ref 12–46)
Lymphs Abs: 1.4 10*3/uL (ref 0.7–4.0)
MCHC: 33.8 g/dL (ref 30.0–36.0)
MCV: 94.2 fL (ref >=78.0)
Monocytes Absolute: 0.6 10*3/uL (ref 0.1–1.0)
Monocytes Relative: 7 % (ref 3–12)
Neutro Abs: 6.3 10*3/uL (ref 1.7–7.7)
Neutrophils Relative %: 76 % (ref 43–77)
Platelets: 221 10*3/uL (ref 150–400)
Potassium: 4.1 meq/L (ref 3.5–5.3)
RBC: 4.3 M/uL (ref 3.87–5.11)
RDW: 13 % (ref 11.5–15.5)
Sodium: 138 meq/L (ref 135–145)
Total Bilirubin: 0.7 mg/dL (ref 0.3–1.2)
Total Protein: 8.8 g/dL — ABNORMAL HIGH (ref 6.0–8.3)
WBC: 8.3 10*3/uL (ref 4.0–10.5)

## 2009-03-29 ENCOUNTER — Telehealth (INDEPENDENT_AMBULATORY_CARE_PROVIDER_SITE_OTHER): Payer: Self-pay | Admitting: *Deleted

## 2009-04-02 ENCOUNTER — Ambulatory Visit: Payer: Self-pay | Admitting: Infectious Diseases

## 2009-04-02 DIAGNOSIS — G629 Polyneuropathy, unspecified: Secondary | ICD-10-CM

## 2009-04-02 LAB — CONVERTED CEMR LAB
HCT: 37.1 % (ref 36.0–46.0)
Hemoglobin: 12.8 g/dL (ref 12.0–15.0)
MCHC: 34.5 g/dL (ref 30.0–36.0)
MCV: 96.1 fL (ref >=78.0)
Platelets: 188 10*3/uL (ref 150–400)
RBC Folate: 311 ng/mL (ref 180–600)
RBC: 3.86 M/uL — ABNORMAL LOW (ref 3.87–5.11)
RDW: 14.5 % (ref 11.5–15.5)
TSH: 1.423 microintl units/mL (ref 0.350–4.5)
Vitamin B-12: 663 pg/mL (ref 211–911)
WBC: 6.6 10*3/uL (ref 4.0–10.5)

## 2009-04-23 ENCOUNTER — Encounter (INDEPENDENT_AMBULATORY_CARE_PROVIDER_SITE_OTHER): Payer: Self-pay | Admitting: *Deleted

## 2009-07-09 ENCOUNTER — Ambulatory Visit: Payer: Self-pay | Admitting: Infectious Diseases

## 2009-07-09 LAB — CONVERTED CEMR LAB
ALT: 78 units/L — ABNORMAL HIGH (ref 0–35)
AST: 162 units/L — ABNORMAL HIGH (ref 0–37)
Albumin: 4 g/dL (ref 3.5–5.2)
Alkaline Phosphatase: 142 units/L — ABNORMAL HIGH (ref 39–117)
BUN: 22 mg/dL (ref 6–23)
Basophils Absolute: 0 10*3/uL (ref 0.0–0.1)
Basophils Relative: 0 % (ref 0–1)
CO2: 20 meq/L (ref 19–32)
Calcium: 9 mg/dL (ref 8.4–10.5)
Chloride: 108 meq/L (ref 96–112)
Creatinine, Ser: 1.48 mg/dL — ABNORMAL HIGH (ref 0.40–1.20)
Eosinophils Absolute: 0.1 10*3/uL (ref 0.0–0.7)
Eosinophils Relative: 1 % (ref 0–5)
Glucose, Bld: 118 mg/dL — ABNORMAL HIGH (ref 70–99)
HCT: 36.4 % (ref 36.0–46.0)
HIV 1 RNA Quant: <48 copies/mL (ref <48)
HIV-1 RNA Quant, Log: <1.68 (ref <1.68)
Hemoglobin: 12.1 g/dL (ref 12.0–15.0)
Lymphocytes Relative: 24 % (ref 12–46)
Lymphs Abs: 1.8 10*3/uL (ref 0.7–4.0)
MCHC: 33.2 g/dL (ref 30.0–36.0)
MCV: 100.6 fL — ABNORMAL HIGH (ref 78.0–100.0)
Monocytes Absolute: 0.4 10*3/uL (ref 0.1–1.0)
Monocytes Relative: 5 % (ref 3–12)
Neutro Abs: 5.1 10*3/uL (ref 1.7–7.7)
Neutrophils Relative %: 69 % (ref 43–77)
Platelets: 257 10*3/uL (ref 150–400)
Potassium: 4.3 meq/L (ref 3.5–5.3)
RBC: 3.62 M/uL — ABNORMAL LOW (ref 3.87–5.11)
RDW: 14.3 % (ref 11.5–15.5)
Sodium: 140 meq/L (ref 135–145)
Total Bilirubin: 0.4 mg/dL (ref 0.3–1.2)
Total Protein: 7.2 g/dL (ref 6.0–8.3)
WBC: 7.5 10*3/uL (ref 4.0–10.5)

## 2009-07-10 ENCOUNTER — Telehealth (INDEPENDENT_AMBULATORY_CARE_PROVIDER_SITE_OTHER): Payer: Self-pay | Admitting: *Deleted

## 2009-07-30 ENCOUNTER — Encounter (INDEPENDENT_AMBULATORY_CARE_PROVIDER_SITE_OTHER): Payer: Self-pay | Admitting: *Deleted

## 2009-07-30 ENCOUNTER — Ambulatory Visit: Payer: Self-pay | Admitting: Infectious Diseases

## 2009-08-14 ENCOUNTER — Encounter (INDEPENDENT_AMBULATORY_CARE_PROVIDER_SITE_OTHER): Payer: Self-pay | Admitting: *Deleted

## 2009-10-02 ENCOUNTER — Ambulatory Visit: Payer: Self-pay | Admitting: Infectious Diseases

## 2009-10-18 ENCOUNTER — Telehealth: Payer: Self-pay | Admitting: Infectious Diseases

## 2009-10-28 ENCOUNTER — Ambulatory Visit: Payer: Self-pay | Admitting: Infectious Diseases

## 2009-10-28 DIAGNOSIS — IMO0001 Reserved for inherently not codable concepts without codable children: Secondary | ICD-10-CM | POA: Insufficient documentation

## 2009-10-28 LAB — CONVERTED CEMR LAB
ALT: 51 units/L — ABNORMAL HIGH (ref 0–35)
AST: 54 units/L — ABNORMAL HIGH (ref 0–37)
Anti Nuclear Antibody(ANA): NEGATIVE
Basophils Absolute: 0 10*3/uL (ref 0.0–0.1)
CO2: 15 meq/L — ABNORMAL LOW (ref 19–32)
CRP: 0 mg/dL (ref <0.6)
Eosinophils Absolute: 0.1 10*3/uL (ref 0.0–0.7)
HCV Quantitative: 1060000 intl units/mL — ABNORMAL HIGH (ref <43)
HIV 1 RNA Quant: <20 copies/mL (ref <20)
HIV-1 RNA Quant, Log: <1.3 (ref <1.30)
Hep B S Ab: NEGATIVE
Lymphocytes Relative: 32 % (ref 12–46)
Lymphs Abs: 2.3 10*3/uL (ref 0.7–4.0)
Neutrophils Relative %: 59 % (ref 43–77)
Platelets: 223 10*3/uL (ref 150–400)
RDW: 13.8 % (ref 11.5–15.5)
Sed Rate: 48 mm/hr — ABNORMAL HIGH (ref 0–22)
Sodium: 141 meq/L (ref 135–145)
Total Bilirubin: 0.5 mg/dL (ref 0.3–1.2)
Total CK: 96 units/L (ref 7–177)
Total Protein: 8 g/dL (ref 6.0–8.3)
WBC: 7.4 10*3/uL (ref 4.0–10.5)

## 2009-12-27 ENCOUNTER — Encounter: Payer: Self-pay | Admitting: Infectious Diseases

## 2009-12-30 ENCOUNTER — Ambulatory Visit: Payer: Self-pay | Admitting: Infectious Diseases

## 2010-01-06 ENCOUNTER — Encounter: Payer: Self-pay | Admitting: Infectious Diseases

## 2010-03-18 NOTE — Miscellaneous (Signed)
Summary: 01/17/2010 Colpo results, ASCUS   Clinical Lists Changes  Observations: Added new observation of PAP SMEAR: ASCUS biopsy (01/17/2009 16:36) Added new observation of LAST PAP DAT: 01/17/2009 (01/17/2009 16:36)

## 2010-03-18 NOTE — Assessment & Plan Note (Signed)
Summary: 51MONTH F/U/VS   CC:  2 month follow  / patient feeling lumps under skin on both arms.  History of Present Illness: 55 yo F with hx of HIV+ since 1994. Was previously using IVDA and was previously incarcerated (d/c 12-09-06).  CD4 230,  VL <48 (07-09-09).   AST 53 -> 162 ALT 42 ->78 ALk P 157 -> 142 Cr 1.14 -> 1.48 Currently taking EFV/EPZ.  Had PAP abn in November- had [facility] 01-17-09. had f/u June 2011: ASCUS. No plan for colpo that she knows of.  Here with DA form. No problems with medicine. Wants refill for sleeping med.   Preventive Screening-Counseling & Management  Alcohol-Tobacco     Alcohol drinks/day: 0     Alcohol type: occas., beer     Smoking Status: quit     Packs/Day: occ     Year Quit: 2005     Passive Smoke Exposure: yes  Caffeine-Diet-Exercise     Caffeine use/day: 0     Does Patient Exercise: no     Type of exercise: walking     Times/week: 7  Safety-Violence-Falls     Seat Belt Use: yes   Updated Prior Medication List: HYDROCHLOROTHIAZIDE 25 MG TABS (HYDROCHLOROTHIAZIDE) Take 1/2 tablet by mouth once a day CLONIDINE HCL 0.1 MG  TABS (CLONIDINE HCL) Take 1 tablet by mouth two times a day LISINOPRIL 30 MG  TABS (LISINOPRIL) Take 1 tablet by mouth once a day EPZICOM 600-300 MG  TABS (ABACAVIR SULFATE-LAMIVUDINE) Take 1 tablet by mouth once a day SUSTIVA 600 MG  TABS (EFAVIRENZ) Take 1 tablet by mouth at bedtime ZOLOFT 50 MG TABS (SERTRALINE HCL) 1 qd AMBIEN 5 MG TABS (ZOLPIDEM TARTRATE) Take 1 tablet by mouth at bedtime as needed insomnia  Current Allergies (reviewed today): No known allergies  Past History:  Past medical, surgical, family and social histories (including risk factors) reviewed, and no changes noted (except as noted below).  Past Medical History: Reviewed history from 04/02/2009 and no changes required. HIV disease Neuropathy Insomnia Hepatitis C Hypertension Pneumonia, hx of, non-PCP  Family History: Reviewed  history from 06/21/2007 and no changes required. denies  Social History: Reviewed history from 01/15/2009 and no changes required. Single Former Smoker Alcohol use-no Drug use-yes  Review of Systems       wt down 4#, teeth cracking, falling out, cont to have neuropathy.  Vital Signs:  Patient profile:   55 year old female Menstrual status:  postmenopausal Height:      66 inches (167.64 cm) Weight:      161.0 pounds (73.18 kg) BMI:     26.08 Temp:     98.1 degrees F oral Pulse rate:   76 / minute BP sitting:   106 / 75  (left arm)  Vitals Entered By: Rocky Morel) (October 02, 2009 9:14 AM) CC: 2 month follow  / patient feeling lumps under skin on both arms Pain Assessment Patient in pain? yes     Location: body joints Intensity: 6 Type: aching Onset of pain  Constant Nutritional Status BMI of 25 - 29 = overweight Nutritional Status Detail appetite is so-so per patient  Does patient need assistance? Functional Status Self care Ambulation Normal   Physical Exam  General:  well-developed, well-nourished, and well-hydrated.   Eyes:  pupils equal, pupils round, and pupils reactive to light.   Mouth:  pharynx pink and moist, poor dentition, and teeth missing.   Neck:  no masses.   Lungs:  normal respiratory effort and normal breath sounds.   Heart:  normal rate, regular rhythm, and no murmur.   Abdomen:  soft, non-tender, and normal bowel sounds.   Neurologic:  alert & oriented X3 and cranial nerves II-XII intact. strength (grip and UE resistance) is 5/5. lower extrem is 5-/5          Medication Adherence: 10/02/2009   Adherence to medications reviewed with patient. Counseling to provide adequate adherence provided   Prevention For Positives: 10/02/2009   Safe sex practices discussed with patient. Condoms offered.                             Impression & Recommendations:  Problem # 1:  HIV DISEASE (ICD-042) she is dong well with her HIV. she  needs dental f/u. will link her to [facility]. needs to start Hep B series. given condoms today. no change in meds.  return to clinic 3 months.   Problem # 2:  DENTAL CARIES (ICD-521.00) try to get into dental  Problem # 3:  ABNORMAL GLANDULAR PAPANICOLAOU SMEAR OF CERVIX (ICD-795.00) will cont to f/u with GYN  Problem # 4:  INSOMNIA (ICD-780.52) ambien refilled Her updated medication list for this problem includes:    Ambien 5 Mg Tabs (Zolpidem tartrate) .Marland Kitchen... Take 1 tablet by mouth at bedtime as needed insomnia  Problem # 5:  PERIPHERAL NEUROPATHY (ICD-356.9) will fill out her DA form.   Other Orders: Est. Patient Level IV [id number]) Future Orders: T-CD4SP ([facility]) (CD4SP) ... 12/31/2009 T-HIV Viral Load [id number]) ... 12/31/2009 T-Comprehensive Metabolic Panel ([id number]) ... 12/31/2009 T-CBC w/Diff [id number]) ... 12/31/2009  Prescriptions: AMBIEN 5 MG TABS (ZOLPIDEM TARTRATE) Take 1 tablet by mouth at bedtime as needed insomnia  #20 x 1   Entered and Authorized by:   Bobby Rumpf MD   Signed by:   Bobby Rumpf MD on 10/02/2009   Method used:   Print then Give to Patient   RxID:   [id number]

## 2010-03-18 NOTE — Progress Notes (Signed)
  04/24/09-Bridge Counselor closed client's bridge counseling file. client kept scheduled appts while working with [employer]  Bridge Counselor tried Engineer, maintenance (IT) nummerous times in order to reconnect to [employer] for case management but was unable to reach due to phone being disconnected. sign.

## 2010-03-18 NOTE — Letter (Signed)
Summary: Sharon Norris: Income Verification  Sharon Norris: Income Verification   Imported By: Bonner Puna 03/14/2009 10:55:27  _____________________________________________________________________  External Attachment:    Type:   Image     Comment:   External Document

## 2010-03-18 NOTE — Progress Notes (Signed)
Summary: Disability Determination Form completion request   Phone Note Call from Patient   Caller: Patient Summary of Call: Patient calling for  disability forms that were left 3 weeks ago.  She left the first request in May 2011. She will be going for her eligibility hearing soon and need this as part of the determaination.   Please complete and mail form ASAP.  Initial call taken by: Orland Mustard RN,  October 18, 2009 10:23 AM  Follow-up for Phone Call        done!

## 2010-03-18 NOTE — Progress Notes (Signed)
Summary: Pt. needing less expensive rx for mirtazapine  Phone Note Outgoing Call   Call placed by: Lorne Skeens RN,  February 20, 2009 12:24 PM Call placed to: Patient Action Taken: Appt scheduled Summary of Call: RN called to remind pt. of GYN appt. 02/21/09 @ 1600.  RN also called to make lab appt. for pt. Appt. made for 03/06/09 @ 1530.  Pt. wanted to let Dr. Johnnye Sima know that the rx for Mirtazapine 15 mg tablets was TOO expensive for her to purchase.  Pt. was wondering whether there was another rx that could be prescribed which would be less expensive.  MD please advise.  Lorne Skeens RN  February 20, 2009 12:27 PM   Follow-up for Phone Call        change to ambien 5mg  at bedtime as needed     New/Updated Medications: AMBIEN 5 MG TABS (ZOLPIDEM TARTRATE) Take 1 tablet by mouth at bedtime as needed insomnia Prescriptions: AMBIEN 5 MG TABS (ZOLPIDEM TARTRATE) Take 1 tablet by mouth at bedtime as needed insomnia  #20 x 1   Entered and Authorized by:   Bobby Rumpf MD   Signed by:   Bobby Rumpf MD on 02/20/2009   Method used:   Print then Give to Patient   RxID:   [id number]   Process Orders Check Orders Results:     [facility]: [id number] not required for this insurance Tests Sent for requisitioning (February 20, 2009 2:34 PM):     03/06/2009: [facility] -- T-CBC w/Diff [id number] (signed)     03/06/2009: [facility] -- T-Comprehensive Metabolic Panel [id number] (signed)     03/06/2009: [facility] -- T-HIV Viral Load [id number] (signed)

## 2010-03-18 NOTE — Assessment & Plan Note (Signed)
Summary: F/U OV/VS   CC:  follow-up visit.  History of Present Illness: 55 yo F with hx of HIV+ since 1994. Was previously using IVDA and was previously incarcerated (d/c 12-09-06).  CD4 230,  VL <48 (07-09-09).   AST 53 -> 162 ALT 42 ->78 ALk P 157 -> 142 Cr 1.14 -> 1.48 Currently taking EFV/EPZ.  Had PAP abn in November- had [facility] 01-17-09. has f/u August 10, 2009. for pap q6 months.  Cont pain in her back and numbness in her feet. Pain/swelling/numbness in her hands. Also c/o back spasms.  is trying to have disabilty form filled out.   Preventive Screening-Counseling & Management  Alcohol-Tobacco     Alcohol drinks/day: 0     Alcohol type: occas., beer     Smoking Status: quit     Packs/Day: occ     Year Quit: 2005     Passive Smoke Exposure: yes  Caffeine-Diet-Exercise     Caffeine use/day: 0     Does Patient Exercise: no     Type of exercise: walking     Times/week: 7  Safety-Violence-Falls     Seat Belt Use: yes   Updated Prior Medication List: HYDROCHLOROTHIAZIDE 25 MG TABS (HYDROCHLOROTHIAZIDE) Take 1/2 tablet by mouth once a day CLONIDINE HCL 0.1 MG  TABS (CLONIDINE HCL) Take 1 tablet by mouth two times a day LISINOPRIL 30 MG  TABS (LISINOPRIL) Take 1 tablet by mouth once a day EPZICOM 600-300 MG  TABS (ABACAVIR SULFATE-LAMIVUDINE) Take 1 tablet by mouth once a day SUSTIVA 600 MG  TABS (EFAVIRENZ) Take 1 tablet by mouth at bedtime ZOLOFT 50 MG TABS (SERTRALINE HCL) 1 qd AMBIEN 5 MG TABS (ZOLPIDEM TARTRATE) Take 1 tablet by mouth at bedtime as needed insomnia  Current Allergies (reviewed today): No known allergies  Current Medications (verified): 1)  Hydrochlorothiazide 25 Mg Tabs (Hydrochlorothiazide) .... Take 1/2 Tablet By Mouth Once A Day 2)  Clonidine Hcl 0.1 Mg  Tabs (Clonidine Hcl) .... Take 1 Tablet By Mouth Two Times A Day 3)  Lisinopril 30 Mg  Tabs (Lisinopril) .... Take 1 Tablet By Mouth Once A Day 4)  Epzicom 600-300 Mg  Tabs (Abacavir  Sulfate-Lamivudine) .... Take 1 Tablet By Mouth Once A Day 5)  Sustiva 600 Mg  Tabs (Efavirenz) .... Take 1 Tablet By Mouth At Bedtime 6)  Zoloft 50 Mg Tabs (Sertraline Hcl) .Marland Kitchen.. 1 Qd 7)  Ambien 5 Mg Tabs (Zolpidem Tartrate) .... Take 1 Tablet By Mouth At Bedtime As Needed Insomnia  Allergies (verified): No Known Drug Allergies   Review of Systems       lost 13#  Vital Signs:  Patient profile:   55 year old Norris Menstrual status:  postmenopausal Height:      66 inches (167.64 cm) Weight:      165.0 pounds (75 kg) BMI:     26.73 Temp:     98.1 degrees F (36.72 degrees C) oral Pulse rate:   91 / minute BP sitting:   120 / 88  (right arm)  Vitals Entered By: Rocky Morel) (July 30, 2009 3:43 PM) CC: follow-up visit Pain Assessment Patient in pain? yes     Location: lower back Intensity: 6 Type: aching Onset of pain  Constant Nutritional Status BMI of 25 - 29 = overweight Nutritional Status Detail appetite is up and down per patient  Have you ever been in a relationship where you felt threatened, hurt or afraid?No   Does patient need assistance?  Functional Status Self care Ambulation Normal        Medication Adherence: 07/30/2009   Adherence to medications reviewed with patient. Counseling to provide adequate adherence provided   Prevention For Positives: 07/30/2009   Safe sex practices discussed with patient. Condoms offered.                             Physical Exam  General:  well-developed, well-nourished, and well-hydrated.   Eyes:  pupils equal, pupils round, and pupils reactive to light.   Mouth:  pharynx pink and moist, no exudates, poor dentition, and teeth missing.   Neck:  no masses.   Lungs:  normal respiratory effort and normal breath sounds.   Heart:  normal rate, regular rhythm, and no murmur.   Abdomen:  soft, non-tender, and normal bowel sounds.     Impression & Recommendations:  Problem # 1:  HIV DISEASE (ICD-042)  she is  doing well. will give her condoms. refer for repeat PAP. return to clinic 1-2 months.   Orders: Neurology Referral (Neuro)  Problem # 2:  HEPATITIS C (ICD-070.Sharon) mild increase in LFTs today. will cont to watch.   Problem # 3:  DENTAL CARIES (ICD-521.00) will try to get her into dental  Problem # 4:  ABNORMAL GLANDULAR PAPANICOLAOU SMEAR OF CERVIX (ICD-795.00) will refer her for repeat PAP  Problem # 5:  PERIPHERAL NEUROPATHY (ICD-356.9)  will try to have her eval by neuro.   Orders: Neurology Referral (Neuro)  Other Orders: Est. Patient Level IV [id number])  Prescriptions: ZOLOFT 50 MG TABS (SERTRALINE HCL) 1 qd  #90 x 1   Entered and Authorized by:   Bobby Rumpf MD   Signed by:   Bobby Rumpf MD on 07/30/2009   Method used:   Electronically to        [facility] (retail)       [address]       [place], [place]  [address]       Ph: [phone]       Fax: [phone]   RxID:   [id number] LISINOPRIL 30 MG  TABS (LISINOPRIL) Take 1 tablet by mouth once a day  #30 x 6   Entered and Authorized by:   Bobby Rumpf MD   Signed by:   Bobby Rumpf MD on 07/30/2009   Method used:   Electronically to        [facility] (retail)       [address]       [place], [place]  [address]       Ph: [phone]       Fax: [phone]   RxID:   [id number] CLONIDINE HCL 0.1 MG  TABS (CLONIDINE HCL) Take 1 tablet by mouth two times a day  #60 x 6   Entered and Authorized by:   Bobby Rumpf MD   Signed by:   Bobby Rumpf MD on 07/30/2009   Method used:   Electronically to        [facility] (retail)       [address]       [place], [place]  [address]       Ph: [phone]       Fax: [phone]   RxID:   [id number]  HYDROCHLOROTHIAZIDE 25 MG TABS (HYDROCHLOROTHIAZIDE) Take 1/2 tablet by mouth once a day  #30 x 6   Entered and Authorized by:   Bobby Rumpf MD   Signed by:   Bobby Rumpf MD on 07/30/2009   Method used:   Electronically to        [facility] (retail)       [address]       [place], [place]  [address]       Ph: [phone]       Fax: [phone]   RxID:   [id number]

## 2010-03-18 NOTE — Assessment & Plan Note (Signed)
Summary: 97MONTH F/U/VS   CC:  50month fu/ numbness in both feet and hands and fatigue.  History of Present Illness: 55 yo F with hx of HIV+ since 1994. Was previously using IVDA and was previously incarcerated (d/c 12-09-06).  CD4 140,  VL 249 (1-20-1).   Currently taking EFV/EPZ.  Had PAP abn in November- had [facility] 01-17-09. has f/u August 10, 2009. for pap q6 months.  has numbness in feet and fingers. tingling sensation only and has to tap constantly. still trying to get to dentist.  feels tired all the time. never feels rested despite taking ambien.   Preventive Screening-Counseling & Management  Alcohol-Tobacco     Alcohol drinks/day: 0     Alcohol type: occas., beer     Smoking Status: quit     Packs/Day: occ     Year Quit: 2005     Passive Smoke Exposure: yes  Caffeine-Diet-Exercise     Caffeine use/day: sodas     Does Patient Exercise: no     Type of exercise: walking     Times/week: 7  Safety-Violence-Falls     Seat Belt Use: yes   Updated Prior Medication List: HYDROCHLOROTHIAZIDE 25 MG TABS (HYDROCHLOROTHIAZIDE) Take 1/2 tablet by mouth once a day CLONIDINE HCL 0.1 MG  TABS (CLONIDINE HCL) Take 1 tablet by mouth two times a day LISINOPRIL 30 MG  TABS (LISINOPRIL) Take 1 tablet by mouth once a day EPZICOM 600-300 MG  TABS (ABACAVIR SULFATE-LAMIVUDINE) Take 1 tablet by mouth once a day SUSTIVA 600 MG  TABS (EFAVIRENZ) Take 1 tablet by mouth at bedtime ZOLOFT 50 MG TABS (SERTRALINE HCL) 1 qd AMBIEN 5 MG TABS (ZOLPIDEM TARTRATE) Take 1 tablet by mouth at bedtime as needed insomnia  Current Allergies (reviewed today): No known allergies  Past History:  Past Medical History: HIV disease Neuropathy Insomnia Hepatitis C Hypertension Pneumonia, hx of, non-PCP  Vital Signs:  Patient profile:   55 year old female Menstrual status:  postmenopausal Height:      66 inches (167.64 cm) Weight:      178.6 pounds (81.18 kg) BMI:     28.93 Temp:     97.7 degrees F  (36.50 degrees C) oral Pulse rate:   90 / minute BP sitting:   150 / 96  (left arm)  Vitals Entered By: Earleen Reaper (April 02, 2009 2:40 PM) CC: 41month fu/ numbness in both feet and hands and fatigue Pain Assessment Patient in pain? yes     Location: feet and hands Intensity: 5 Type: numbness/ tingling Onset of pain  Constant Nutritional Status BMI of 25 - 29 = overweight Nutritional Status Detail appetite "so so" per pt  Does patient need assistance? Functional Status Self care Ambulation Normal   Physical Exam  General:  well-developed, well-nourished, and well-hydrated.   Eyes:  pupils equal, pupils round, and pupils reactive to light.   Mouth:  pharynx pink and moist, no exudates, and poor dentition.   Neck:  no masses.   Lungs:  normal respiratory effort and normal breath sounds.   Heart:  normal rate, regular rhythm, and no murmur.   Abdomen:  soft, non-tender, and normal bowel sounds.   Neurologic:  decreased light touch in hands        Medication Adherence: 04/02/2009   Adherence to medications reviewed with patient. Counseling to provide adequate adherence provided   Prevention For Positives: 04/02/2009   Safe sex practices discussed with patient. Condoms offered.  Impression & Recommendations:  Problem # 1:  HIV DISEASE (ICD-042)  her CD4 has dropped back to baseline. given condoms. needs hep B series.   Orders: T-CBC No Diff [id number]) T-TSH [id number]) T-Vitamin B12 (99991111) T-Folic Acid; RBC (Q000111Q) T-Syphilis Test (RPR) [id number])  Problem # 2:  INSOMNIA (ICD-780.52) will cont ambien. may need to increase dose.  Her updated medication list for this problem includes:    Ambien 5 Mg Tabs (Zolpidem tartrate) .Marland Kitchen... Take 1 tablet by mouth at bedtime as needed insomnia  Problem # 3:  ABNORMAL GLANDULAR PAPANICOLAOU SMEAR OF CERVIX (ICD-795.00) she will f/u with gyn for q 6 month  pap.  Problem # 4:  DENTAL CARIES (ICD-521.00) she is in process of f/u with dental.   Problem # 5:  PERIPHERAL NEUROPATHY (ICD-356.9)  will check tsh, lead, rpr, b12, folate. suspect HIV neuropathy or HTN. if blood tests negative will refer to neuro.   Orders: T-CBC No Diff [id number]) T-TSH [id number]) T-Vitamin B12 (99991111) T-Folic Acid; RBC (Q000111Q) T-Syphilis Test (RPR) [id number])  Other Orders: Est. Patient Level IV [id number]) Future Orders: T-CD4SP ([facility]) (CD4SP) ... 07/01/2009 T-HIV Viral Load [id number]) ... 07/01/2009 T-Comprehensive Metabolic Panel ([id number]) ... 07/01/2009 T-CBC w/Diff [id number]) ... 07/01/2009   Appended Document: Orders Update    Clinical Lists Changes  Orders: Added new Test order of T-Lead Level [id number]) - Signed

## 2010-03-18 NOTE — Miscellaneous (Signed)
Summary: clinical update/ryan white [facility] appr til 05/17/10  Clinical Lists Changes  Observations: Added new observation of AIDSDAP: Yes 2011 (04/23/2009 8:52)

## 2010-03-18 NOTE — Miscellaneous (Signed)
Summary: clinical update/ryan white  Clinical Lists Changes  Observations: Added new observation of FINASSESSDT: 07/30/2009 (07/30/2009 16:31)

## 2010-03-18 NOTE — Miscellaneous (Signed)
Summary: [place] [facility]  [facility]   Imported By: Bonner Puna 12/30/2009 15:32:34  _____________________________________________________________________  External Attachment:    Type:   Image     Comment:   External Document

## 2010-03-18 NOTE — Letter (Signed)
Summary: [facility]: Rx  [facility]: Rx   Imported By: Bonner Puna 02/27/2009 15:58:00  _____________________________________________________________________  External Attachment:    Type:   Image     Comment:   External Document

## 2010-03-18 NOTE — Assessment & Plan Note (Signed)
Summary: 87MONTH F/U/VS   CC:  3 month follow up.  History of Present Illness: 55 yo F with hx of HIV+ since 1994. Was previously using IVDA and was previously incarcerated (d/c 12-09-06).  CD4 230,  VL <48 (07-09-09).   AST 53 -> 162 ALT 42 ->78 ALk P 157 -> 142 Cr 1.14 -> 1.48 Currently taking EFV/EPZ.  Had PAP abn in November- had [facility] 01-17-09. had f/u June 2011: ASCUS.  c/o pain in feet, legs, arms, feet. having twisting, cramping. is every day. having difficulty walking. has cramping so bad that she is unable to stand then she is scared to move.   Preventive Screening-Counseling & Management  Alcohol-Tobacco     Alcohol drinks/day: 0     Alcohol type: occas., beer     Smoking Status: quit     Packs/Day: occ     Year Quit: 2005     Passive Smoke Exposure: yes  Caffeine-Diet-Exercise     Caffeine use/day: 0     Does Patient Exercise: no  Safety-Violence-Falls     Seat Belt Use: yes  Current Medications (verified): 1)  Hydrochlorothiazide 25 Mg Tabs (Hydrochlorothiazide) .... Take 1/2 Tablet By Mouth Once A Day 2)  Clonidine Hcl 0.1 Mg  Tabs (Clonidine Hcl) .... Take 1 Tablet By Mouth Two Times A Day 3)  Lisinopril 30 Mg  Tabs (Lisinopril) .... Take 1 Tablet By Mouth Once A Day 4)  Epzicom 600-300 Mg  Tabs (Abacavir Sulfate-Lamivudine) .... Take 1 Tablet By Mouth Once A Day 5)  Sustiva 600 Mg  Tabs (Efavirenz) .... Take 1 Tablet By Mouth At Bedtime 6)  Zoloft 50 Mg Tabs (Sertraline Hcl) .Marland Kitchen.. 1 Qd 7)  Ambien 5 Mg Tabs (Zolpidem Tartrate) .... Take 1 Tablet By Mouth At Bedtime As Needed Insomnia  Allergies (verified): No Known Drug Allergies    Updated Prior Medication List: HYDROCHLOROTHIAZIDE 25 MG TABS (HYDROCHLOROTHIAZIDE) Take 1/2 tablet by mouth once a day CLONIDINE HCL 0.1 MG  TABS (CLONIDINE HCL) Take 1 tablet by mouth two times a day LISINOPRIL 30 MG  TABS (LISINOPRIL) Take 1 tablet by mouth once a day EPZICOM 600-300 MG  TABS (ABACAVIR SULFATE-LAMIVUDINE)  Take 1 tablet by mouth once a day SUSTIVA 600 MG  TABS (EFAVIRENZ) Take 1 tablet by mouth at bedtime ZOLOFT 50 MG TABS (SERTRALINE HCL) 1 qd AMBIEN 5 MG TABS (ZOLPIDEM TARTRATE) Take 1 tablet by mouth at bedtime as needed insomnia  Current Allergies (reviewed today): No known allergies  Past History:  Past medical, surgical, family and social histories (including risk factors) reviewed, and no changes noted (except as noted below).  Past Medical History: Reviewed history from 04/02/2009 and no changes required. HIV disease Neuropathy Insomnia Hepatitis C Hypertension Pneumonia, hx of, non-PCP  Family History: Reviewed history from 06/21/2007 and no changes required. denies  Social History: Reviewed history from 01/15/2009 and no changes required. Single Former Smoker Alcohol use-no Drug use-yes  Review of Systems       wt steady, last PAP June 2011.   Vital Signs:  Patient profile:   55 year old female Menstrual status:  postmenopausal Height:      66 inches (167.64 cm) Weight:      160.4 pounds (72.91 kg) BMI:     25.98 Temp:     98.0 degrees F (36.67 degrees C) oral Pulse rate:   89 / minute BP sitting:   143 / 84  (left arm)  Vitals Entered By: Sherrie George CMA(AAMA) (  October 28, 2009 9:11 AM) CC: 3 month follow up Pain Assessment Patient in pain? yes     Location: body aches Intensity: 6 Type: aching Onset of pain  Constant Nutritional Status BMI of 25 - 29 = overweight Nutritional Status Detail appetite is so-so per patient  Does patient need assistance? Functional Status Self care Ambulation Normal        Medication Adherence: 10/28/2009   Adherence to medications reviewed with patient. Counseling to provide adequate adherence provided   Prevention For Positives: 10/28/2009   Safe sex practices discussed with patient. Condoms offered.                             Physical Exam  General:  well-developed, well-nourished, and  well-hydrated.   Eyes:  pupils equal, pupils round, and pupils reactive to light.   Mouth:  pharynx pink and moist, no exudates, poor dentition, and teeth missing.   Neck:  no masses.   Lungs:  normal respiratory effort and normal breath sounds.   Heart:  normal rate, regular rhythm, and no murmur.   Abdomen:  soft, non-tender, and normal bowel sounds.   Extremities:  mild edema in hands.  Neurologic:  alert & oriented X3 and cranial nerves II-XII intact.  mild decrease in strength LE.    Impression & Recommendations:  Problem # 1:  HIV DISEASE (ICD-042) will check her labs, offered condons. will work on Hydrographic surveyor out her DA form. return to clinic 2-3 months.  Orders: T-Hepatitis C Viral Load [id number]) T-CK Total [id number]) T-Antinuclear Antib (ANA) [id number]) T-Hepatitis B Surface Antigen [id number]) T-Hepatitis B Surface Antibody [id number]) T-C-Reactive Protein [id number]) T-Sed Rate (Automated) [id number]) T-TSH [id number]) T- * Misc. Laboratory test [id number])  Problem # 2:  HYPERTENSION (ICD-401.9) fairly controlled today.  Her updated medication list for this problem includes:    Hydrochlorothiazide 25 Mg Tabs (Hydrochlorothiazide) .Marland Kitchen... Take 1/2 tablet by mouth once a day    Clonidine Hcl 0.1 Mg Tabs (Clonidine hcl) .Marland Kitchen... Take 1 tablet by mouth two times a day    Lisinopril 30 Mg Tabs (Lisinopril) .Marland Kitchen... Take 1 tablet by mouth once a day  Problem # 3:  DENTAL CARIES (ICD-521.00) will try to get her into dental.   Problem # 4:  ABNORMAL GLANDULAR PAPANICOLAOU SMEAR OF CERVIX (ICD-795.00) she will f/u with GYN  Problem # 5:  HEPATITIS C (ICD-070.51) check VL and genotype  Problem # 6:  MYALGIA (ICD-729.1)  will check Ca, ESR, CRP, ANA, ANCA and TSH. she may need rheum eval.   Orders: T- * Misc. Laboratory test [id number])  Other Orders: Est. Patient Level IV [id number]) T-CD4SP [facility]) (CD4SP) T-HIV Viral Load [id number]) T-Comprehensive Metabolic  Panel ([id number]) T-CBC w/Diff [phone])  Appended Document: Orders Update    Clinical Lists Changes  Orders: Added new Service order of Influenza Vaccine NON MCR [id number]) - Signed Observations: Added new observation of FLU VAX#1VIS: 09/10/09 version given October 28, 2009. (10/28/2009 14:26) Added new observation of FLU VAXLOT: [id number] (10/28/2009 14:26) Added new observation of FLU VAX EXP: 05/18/2010 (10/28/2009 14:26) Added new observation of FLU VAXBY: Sherrie George [facility]) (10/28/2009 14:26) Added new observation of FLU VAXRTE: IM (10/28/2009 14:26) Added new observation of FLU VAX DSE: 0.5 ml (10/28/2009 14:26) Added new observation of FLU VAXMFR: Novartis (10/28/2009 14:26) Added new observation of FLU VAX SITE: right deltoid (10/28/2009 14:26) Added new observation of FLU VAX: Fluvax Non-MCR (10/28/2009 14:26)  Influenza Vaccine    Vaccine Type: Fluvax Non-MCR    Site: right deltoid    Mfr: Novartis    Dose: 0.5 ml    Route: IM    Given by: Sherrie George CMA(AAMA)    Exp. Date: 05/18/2010    Lot #: [id number]    VIS given: 09/10/09 version given October 28, 2009.  Flu Vaccine Consent Questions    Do you have a history of severe allergic reactions to this vaccine? no    Any prior history of allergic reactions to egg and/or gelatin? no    Do you have a sensitivity to the preservative Thimersol? no    Do you have a past history of Guillan-Barre Syndrome? no    Do you currently have an acute febrile illness? no    Have you ever had a severe reaction to latex? no    Vaccine information given and explained to patient? yes    Are you currently pregnant? no

## 2010-03-18 NOTE — Progress Notes (Signed)
Summary: Restaurant manager, fast food Note Call from Patient   Summary of Call: 01/02/09-connected with [employer] 01/03/09-Bridge Counselor transported client to medical appt 01/15/09-Bridge Counselor transported client to medical appt 01/17/09-Bridge Counselor transported client to medical appt .sign Initial call taken by: Everlene Other,  Jul 10, 2009 2:24 PM

## 2010-03-18 NOTE — Assessment & Plan Note (Signed)
Summary: 75MONTH F/U/VS   CC:  numbness in feet and cramping in legs and hands.  History of Present Illness: 55 yo F with hx of HIV+ since 1994 and Hep C (1a VL 1,060,000). Was previously using IVDA and was previously incarcerated (d/c 12-09-06).  CD4 270,  VL <20 (10-28-09).   AST 53 -> 162 -> 54 ALT 42 ->78 -> 51 Cr 1.14 -> 1.48 -> 1.37 Currently taking EFV/EPZ.  Had PAP abn in November- had [facility] 01-17-09. had f/u June 2011: ASCUS.    Today c/o cramps in feet and legs, and back pain. At last visit had CPK negative, ANA and ANCA negative.  Wsa given Lorrin Mais previously but continues to have difficulty sleeping.   Preventive Screening-Counseling & Management  Alcohol-Tobacco     Alcohol drinks/day: 0     Alcohol type: occas., beer     Smoking Status: quit     Packs/Day: occ     Year Quit: 2005     Passive Smoke Exposure: yes   Updated Prior Medication List: HYDROCHLOROTHIAZIDE 25 MG TABS (HYDROCHLOROTHIAZIDE) Take 1/2 tablet by mouth once a day CLONIDINE HCL 0.1 MG  TABS (CLONIDINE HCL) Take 1 tablet by mouth two times a day LISINOPRIL 30 MG  TABS (LISINOPRIL) Take 1 tablet by mouth once a day EPZICOM 600-300 MG  TABS (ABACAVIR SULFATE-LAMIVUDINE) Take 1 tablet by mouth once a day SUSTIVA 600 MG  TABS (EFAVIRENZ) Take 1 tablet by mouth at bedtime ZOLOFT 50 MG TABS (SERTRALINE HCL) 1 qd AMBIEN 5 MG TABS (ZOLPIDEM TARTRATE) Take 1-2  tablet by mouth at bedtime as needed insomnia  Current Allergies (reviewed today): No known allergies  Past History:  Past medical, surgical, family and social histories (including risk factors) reviewed, and no changes noted (except as noted below).  Past Medical History: Reviewed history from 04/02/2009 and no changes required. HIV disease Neuropathy Insomnia Hepatitis C Hypertension Pneumonia, hx of, non-PCP  Family History: Reviewed history from 06/21/2007 and no changes required. denies  Social History: Reviewed history from  01/15/2009 and no changes required. Single Former Smoker Alcohol use-no Drug use-yes  Review of Systems       c/o neuropathy in feet. went [facility] and was unable to be seen. c/o pain and fears she has mandibular abscess.   Vital Signs:  Patient profile:   55 year old female Menstrual status:  postmenopausal Height:      66 inches (167.64 cm) Weight:      161.25 pounds (73.30 kg) BMI:     26.12 Temp:     97.7 degrees F (36.50 degrees C) oral Pulse rate:   77 / minute BP sitting:   136 / 86  (left arm)  Vitals Entered By: Jarrett Ables CMA (December 30, 2009 8:55 AM) CC: numbness in feet, cramping in legs and hands Is Patient Diabetic? No Pain Assessment Patient in pain? yes     Location: back Intensity: 6 Type: sharp Nutritional Status BMI of 25 - 29 = overweight  Does patient need assistance? Functional Status Self care Ambulation Normal   Physical Exam  General:  well-developed, well-nourished, and well-hydrated.   Eyes:  pupils equal, pupils round, and pupils reactive to light.   Ears:  impacted B Mouth:  pharynx pink and moist, poor dentition, excessive plaque, and halitosis.   Neck:  no masses.   Lungs:  normal respiratory effort and normal breath sounds.   Heart:  normal rate, regular rhythm, and no murmur.  Abdomen:  soft, non-tender, and normal bowel sounds.          Medication Adherence: 12/30/2009   Adherence to medications reviewed with patient. Counseling to provide adequate adherence provided   Prevention For Positives: 12/30/2009   Safe sex practices discussed with patient. Condoms offered.                             Impression & Recommendations:  Problem # 1:  HIV DISEASE (ICD-042) CD4 is stable and VL undetectable.  her Cr has been relatively stable. she needs dental eval. offered condoms. return to clinic 3-4 months.   Orders: Rheumatology Referral (Rheumatology)  Diagnostics Reviewed:  HIV: CDC-defined  AIDS (04/16/2008)   CD4: 270 (10/29/2009)   WBC: 7.4 (10/28/2009)   Hgb: 13.0 (10/28/2009)   HCT: 39.7 (10/28/2009)   Platelets: 223 (10/28/2009) HIV-1 RNA: <20 copies/mL (10/28/2009)   HBSAg: NEG (10/28/2009)  Problem # 2:  HEPATITIS C (ICD-070.51) will cont to watch her  her LFTs. start Hep B series.   Problem # 3:  MYALGIA (ICD-729.1)  considering Rheum eval.   Orders: Rheumatology Referral (Rheumatology)  Problem # 4:  INSOMNIA (ICD-780.52) increase ambien to 10mg  once daily  Her updated medication list for this problem includes:    Ambien 5 Mg Tabs (Zolpidem tartrate) .Marland Kitchen... Take 1-2  tablet by mouth at bedtime as needed insomnia  Problem # 5:  DENTAL CARIES (ICD-521.00)  Medications Added to Medication List This Visit: 1)  Ambien 5 Mg Tabs (Zolpidem tartrate) .... Take 1-2  tablet by mouth at bedtime as needed insomnia    Appended Document: Orders Update    Clinical Lists Changes  Orders: Added new Service order of Cerumen Impaction Removal [id number]) - Signed

## 2010-03-20 NOTE — Miscellaneous (Signed)
Summary: [facility]  [facility]   Imported By: Garlan Fillers 01/13/2010 09:41:24  _____________________________________________________________________  External Attachment:    Type:   Image     Comment:   External Document

## 2010-04-03 ENCOUNTER — Encounter (INDEPENDENT_AMBULATORY_CARE_PROVIDER_SITE_OTHER): Payer: Self-pay | Admitting: *Deleted

## 2010-04-05 ENCOUNTER — Encounter (INDEPENDENT_AMBULATORY_CARE_PROVIDER_SITE_OTHER): Payer: Self-pay | Admitting: *Deleted

## 2010-04-09 ENCOUNTER — Other Ambulatory Visit: Payer: Self-pay

## 2010-04-09 NOTE — Miscellaneous (Signed)
  Clinical Lists Changes 

## 2010-04-09 NOTE — Miscellaneous (Signed)
  Clinical Lists Changes  Observations: Added new observation of INCOMESOURCE: UNEMPLOYED (04/03/2010 16:18)

## 2010-04-23 ENCOUNTER — Ambulatory Visit: Payer: Self-pay | Admitting: Infectious Diseases

## 2010-05-01 LAB — T-HELPER CELL (CD4) - (RCID CLINIC ONLY)
CD4 % Helper T Cell: 12 % — ABNORMAL LOW (ref 33–55)
CD4 T Cell Abs: 270 uL — ABNORMAL LOW (ref 400–2700)

## 2010-05-04 LAB — T-HELPER CELL (CD4) - (RCID CLINIC ONLY): CD4 T Cell Abs: 140 uL — ABNORMAL LOW (ref 400–2700)

## 2010-05-05 LAB — T-HELPER CELL (CD4) - (RCID CLINIC ONLY)
CD4 % Helper T Cell: 13 % — ABNORMAL LOW (ref 33–55)
CD4 T Cell Abs: 230 uL — ABNORMAL LOW (ref 400–2700)

## 2010-05-21 ENCOUNTER — Other Ambulatory Visit: Payer: Self-pay

## 2010-05-21 DIAGNOSIS — Z113 Encounter for screening for infections with a predominantly sexual mode of transmission: Secondary | ICD-10-CM

## 2010-05-21 DIAGNOSIS — B20 Human immunodeficiency virus [HIV] disease: Secondary | ICD-10-CM

## 2010-05-22 LAB — T-HELPER CELL (CD4) - (RCID CLINIC ONLY): CD4 % Helper T Cell: 16 % — ABNORMAL LOW (ref 33–55)

## 2010-06-03 LAB — DIFFERENTIAL
Basophils Relative: 1 % (ref 0–1)
Eosinophils Absolute: 0 10*3/uL (ref 0.0–0.7)
Monocytes Absolute: 0.3 10*3/uL (ref 0.1–1.0)
Monocytes Relative: 5 % (ref 3–12)
Neutrophils Relative %: 73 % (ref 43–77)

## 2010-06-03 LAB — CBC
Platelets: 183 10*3/uL (ref 150–400)
RDW: 13.4 % (ref 11.5–15.5)
WBC: 6.1 10*3/uL (ref 4.0–10.5)

## 2010-06-04 ENCOUNTER — Ambulatory Visit: Payer: Self-pay | Admitting: Infectious Diseases

## 2010-07-01 NOTE — Discharge Summary (Signed)
NAMESHIZUYE, Sharon Norris              ACCOUNT NO.:  [id number]   MEDICAL RECORD NO.:  [id number]          PATIENT TYPE:  INP   LOCATION:  6709                         FACILITY:  [facility]   PHYSICIAN:  C. Milta Deiters, M.D.DATE OF BIRTH:  May 07, 1955   DATE OF ADMISSION:  12/31/2006  DATE OF DISCHARGE:  01/01/2007                               DISCHARGE SUMMARY   DISCHARGE DIAGNOSES:  1. Profound anemia, likely secondary to AZT usage.  2. Human immunodeficiency virus.  3. Hepatitis C.  4. Hypertension.  5. History of polysubstance abuse.  6. Pyuria.   DISCHARGE MEDICATIONS:  1. Zithromax 1200 mg p.o. twice a week.  2. Dapsone 100 mg p.o. daily.  3. Hydrochlorothiazide 12.5 mg p.o. daily.  4. Clonidine 0.1 mg p.o. b.i.d.  5. Lisinopril 30 mg p.o. daily.  6. Omeprazole 20 mg b.i.d. daily.  7. Hydroxyzine 50 mg p.o. q.6 h p.r.n. itching.  8. Platinol 1 drop in each eye b.i.d.  9. Multivitamin daily.  10.Artificial tears 1 drop in each eye four times daily.  11.The patient is to stop taking the Epivir, Retrovir, and Sustiva      until followup appointment with Infectious Disease.   DISPOSITION AND FOLLOWUP:  The patient is to go to the outpatient clinic  on Monday, November 17, per recommendations from Dr. Stann Mainland to speak  with an infectious disease doctor about restarting her HAART regimen  secondary to stopping all medications because of the profound anemia  which likely was secondary to AZT.  The patient will need a new regimen  to go on.  The patient should probably also have a CBC done on Monday to  make sure her hemoglobin is stabilized.  The patient has no active signs  of bleeding currently.   PROCEDURES:  The patient had a chest x-ray done on December 31, 2006,  which showed impression of slightly more basilar lung markings than seen  in 2005.  This could represent progressive scarring or it could indicate  mild atelectasis and/or pneumonia.   There were no consultations  made.  A brief H&P is as follows.   CHIEF COMPLAINT:  Shortness of breath, dizziness, cough, congestion.   PRIMARY CARE PHYSICIAN:  Dr. Orene Desanctis in the outpatient clinic.   HISTORY OF PRESENT ILLNESS:  The patient is a 55 year old African-  American female with past medical history significant for 042 with a  last CD-4 count of 170 in late 2006, history of IV drug abuse,  hypertension, hepatitis C who presented to the ED secondary to 3-4 days  history of increased shortness of breath on mild exertion and associated  dizziness.  The patient denies chest pain, but endorses rapid heart  rate.  She was released from prison on December 09, 2006, after 6 months  incarceration.  Said she felt normal until early this week when she  noted shortness of breath on moderate exertion that severely limited her  activity.  She denies cough, sputum production, hemoptysis, hematemesis,  hematochezia, melena, diarrhea, or constipation.  No recent trauma,  hematuria, headache. No vision changes aside from the dizziness.  She  denies urinary symptoms but endorsed chronic itching which she  attributes to HCV.  No diaphoresis or syncope.   ALLERGIES, PAST MEDICAL HISTORY, PAST SURGICAL HISTORY, MEDICATIONS,  SUBSTANCE HISTORY, SOCIAL HISTORY, FAMILY HISTORY:  Please see hospital  chart.   PHYSICAL EXAMINATION:  VITAL SIGNS:  Temperature equals 97.9, blood  pressure equals 93/59, pulse equals 99, respiratory rate equals 80, O2  saturation equals 95% on 2 liters.  GENERAL:  She is awake, alert and oriented x3, lying in bed, scratching  secondary to itching, in no acute distress.  EYES:  PERRLA.  EOMI.  Mild scattered melanocytosis without frank  icterus.  Early arcus senilis.  ENT: Oropharynx is dry.  Mucosa is pale, no plaque to suggest thrush.  NECK:  Supple without lymphadenopathy or thyromegaly.  RESPIRATORY:  Clear to auscultation bilaterally.  Good air movement.  CARDIOVASCULAR:  Tachycardic, normal  S1-S2, no audible murmurs, rubs or  gallops.  GI:  Soft, nontender, nondistended. Positive bowel sounds.  EXTREMITIES:  Bilateral xerosis of the hands.  Similar skin changes on  the bilateral legs.  LYMPHADENOPATHY:  No lymphadenopathy.  MUSCULOSKELETAL:  The patient moves all four extremities spontaneously.  NEUROLOGIC:  Cranial nerves II-XII grossly intact.  Motor 5/5 in all  groups.  Sensation intact in bilateral upper extremities and lower  extremities.  Appropriate and cooperative.   ADMISSION LABORATORY DATA:  WBC equals 3.5. Hemoglobin equals 5.6.  Hematocrit equals 16.5. Platelets were clumped. ANC is 2.4, MCV 115.5,  RDW 23.3.  BNP of 79.   Chest x-ray showed increased basilar markings versus 2005 x-ray,  questionable progressive scarring, atelectasis or early pneumonia.   HOSPITAL COURSE BY PROBLEM:  #1.  PROFOUND ANEMIA, LIKELY SECONDARY TO THE PATIENT'S AZT USAGE:  Anemia panel was drawn but was after the transfusions, so results were  not going to be helpful.  The patient was admitted to telemetry bed and  was given 2 units of packed red blood cells her profound anemia.  Her  AZT will be discontinued at this time.  Prior to the patient's  discharge, she will have a CBC checked post transfusion to make sure  hemoglobin is stable.  Upon discussing the case with the attending  physician, Dr. Stann Mainland, it was decided if her hemoglobin is stable, she  does not need to stay in the hospital.  The patient will come into the  outpatient clinic on Monday to speak with an infectious disease  physician to see about reinitiating her HAART treatment.  At this time,  we will hold all three of her medications and not start any prior to her  speaking with an infectious disease doctor.  Of note, the patient was  fecal occult blood test positive which may be secondary to hemorrhoids.  It hemoglobin does decrease again, the patient would likely benefit from  a GI workup which would include a  colonoscopy secondary to the patient  never having a colonoscopy done in the past.   #2.  HUMAN IMMUNODEFICIENCY VIRUS:  CD-4 and viral load were pending.  We will also order genotype at this time.  As mentioned above, all three  of her HIV medications will be held until she sees infectious disease in  the outpatient clinic.   #3.  HEPATITIS C VIRUS:  Hepatitis C virus and the hepatitis C viral  load is pending. This will need to be followed up in the outpatient  setting as well.   #4.  HYPERTENSION:  The patient is on clonidine.  May be able to restart  her home medications as a later time when her hemoglobin is more stable.   #5.  HISTORY OF POLYSUBSTANCE ABUSE:  The patient's urine drug screen  was negative.   #6.  PYURIA:  The patient is asymptomatic, but, given her  immunocompromised status, we will treat with ciprofloxacin.  A urine C&S  is pending at this time.   DISCHARGE LABORATORY AND VITAL SIGNS:  Include anemia panel which is  probably useless secondary to the patient receiving the blood and shows  RBC 0.64 and low, reticulocytes absolute 2.6 and low, iron 361. TIBC and  percent saturation were not calculated secondary to UIBC less than 55.  Vitamin B12 was 368.  The patient may want to have a methylmalonic acid  checked at outpatient followup to make sure she is not B12 deficient.  Serum folate was normal at 11.1.  Ferritin was elevated at 3285.  Sodium  136, potassium 3.8, chloride 108, bicarb 19, glucose 118, BUN 28,  creatinine 1.26. Calcium equals 8.74. Total bilirubin equals 1.9.  Alkaline phosphatase equals 179. AST equals 63. ALT equals 51. Total  protein equals 7.1. Albumin equals 3.1. WBC equals 4.7. Hemoglobin after  1 unit is 8.4, hematocrit 24.0, platelets 323.  The patient had an LDH  which was 251, mildly elevated.  Urine drug screen was negative as  mentioned earlier.   Discharge vital signs included temperature of 99.9, pulse 97,  respiratory rate  20, blood pressure 124/77, O2 saturation 96% on 2  liters.      Katha Cabal, M.D.  Electronically Signed      C. Milta Deiters, M.D.  Electronically Signed    JY/MEDQ  D:  01/01/2007  T:  01/02/2007  Job:  [id number]

## 2010-07-04 NOTE — Discharge Summary (Signed)
NAMEMarland Kitchen  JOHNETTE, ORLOSKY NO.:  [id number]   MEDICAL RECORD NO.:  [id number]                   PATIENT TYPE:  INP   LOCATION:  5509                                 FACILITY:  [place]   PHYSICIAN:  Adella Hare, M.D.                   DATE OF BIRTH:  Nov 12, 1955   DATE OF ADMISSION:  08/11/2003  DATE OF DISCHARGE:  08/15/2003                                 DISCHARGE SUMMARY   PRIMARY CARE PHYSICIAN:  [facility].   DISCHARGE DIAGNOSES:  1. Bronchitis/pneumonia.  2. Human immunodeficiency virus diagnosed in 1994, with a CD4 count of less     than 10 on August 11, 2003.  3. Hepatitis C.  4. Hypertension.   DISCHARGE MEDICATIONS:  1. Levaquin 750 mg p.o. every day for seven days.  2. Bactrim DS p.o. every day.  3. Hydrochlorothiazide 12.5 mg p.o. every day.  4. Clotrimazole topical cream apply on the neck b.i.d.  5. Humibid long-acting one tablet b.i.d. for two days with plenty of fluids.   DISPOSITION AND FOLLOWUP:  1. The patient is to follow up at [facility] on August 16, 2003 at 8 a.m.  2. She is also to follow up with Dr. Sharlet Salina on August 23, 2003 at 2 p.m. at     the outpatient clinic, during that visit the following will be done:  1.     Check for blood pressure and titrate medicines if needed.  2. Check for     the fungal infection on the neck.  The patient already using topical     clotrimazole.  3. Set up the patient with Baldo Ash for the ADAP ([place]) since the patient will need medication     assistance.  4. Set up a followup appointment with Dr. Orene Desanctis in one month.     5.  Set up the patient with the hepatitis C Clinic.  3. The patient will be given prescriptions for Truvada and Sustiva on     hospitalization visit.   PROCEDURES DONE:  Chest x-ray performed, on August 11, 2003 showed  peribronchial thickening without focal air space disease, and then chest x-  ray done on August 13, 2003, showed a more acute component of  infiltrate  suspected in the right middle lobe/right lower lobe, also possible that  infiltrate is present the left lower lobe.   ADMISSION HISTORY AND PHYSICAL:  Ms. Royann Goth is a 55 year old black  female with HIV that was diagnosed, in 1994, who came to the ED with  complaints of fever, cough productive of white thick sputum, shortness of  breath, and headaches.  She also described some neck stiffness, myalgias,  arthralgias, also patient was diagnosed with HIV, in 1994, and lived a very  rough lifestyle with heavy use of IV drugs.  She is unaware of her CD4 count  and did not follow  up with any doctor until now.  Her daughter is not aware  of her HIV status.   ALLERGIES:  No known drug allergies.   SUBSTANCE HISTORY:  The patient smokes half a pack per day for 30 years.  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  The  patient was a heavy cocaine user and other IV drugs like heroin and quit  four years ago.   SOCIAL HISTORY:  The patient is single and has finished 1-1/2 years of  college and works in [employer].  She is self-pay and lives with her  daughter and moved from [place] in 2001, with her daughter.   FAMILY MEDICAL HISTORY:  Mother is healthy at 25 years of age.  Father died  at age 62 from liver cirrhosis.   PHYSICAL EXAMINATION:  VITAL SIGNS:  On admission, pulse 130, blood pressure  226/120, temperature 103.9, respirations 20, O2 sat is 97% on room air.  HEENT:  No thrush.  RESPIRATORY:  Indicated bibasilar crackles but good air movement.  CARDIOVASCULAR:  The patient was tachycardic but there were no murmurs.  GI:  Abdomen was soft but diffusely tender to palpation.  SKIN:  Hyperkeratotic multiple papular lesions observed on the neck and the  left forearm.  And, the patient has several scars from IV drugs all over.   ADMISSION LABORATORY DATA:  Sodium 133, potassium 3.8, chloride 110, bicarb  25, BUN 16, creatinine 1.2, glucose  103.  Hemoglobin 12.1 with MCV 92.3,  white blood count 5.9 with ANC 4.6, platelets 236.  Bilirubin less than 0.1.  Alk phos 112.  SGOT 54, SGPT 24.  Protein 6.2.  Albumin 1.8.  Calcium 7.8.  Urinalysis showed many bacteria and Trichomonas.  UDS was negative.   Chest x-ray with peribronchial thickening as indicated above.   HOSPITAL COURSE:  1. Bronchitis/pneumonia.  The patient was started on Avelox for presumed     pneumonia.  She did not receive any IV antibiotics because IV access     could not be established on the patient.  An attempt was also made to put     in a PICC line, which was placed by radiology, but went up only to the     axillary vein and thus had to be discontinued.  Sputum cultures were sent     and grew normal oral flora.  Two sets of blood cultures were obtained and     were negative.  Influenza A and B titers were obtained and the titers     were negative for ITA, but EP workup for influenza B IgG.  Also, all     cryptococcal antigen tests was obtained since the patient did complain of     neck stiffness and were negative.  Finally, sputum was also sent for AFB,     and the AFB smear was negative x2 and the tuberculin test was also     negative.  Finally, blood cultures were also negative for MAC.  The     patient received four days of Avelox and is to continue treatment at home     with seven days of Levaquin for a total of 11 days of antibiotics.  2. HIV.  The patient did not have any prior CD4 count.  CD4 count obtained     during this hospitalization was 10, and was repeated and came back to be     less than 10.  Viral  load is pending.  Because of her low CD4 count, the     patient was started on prophylactic treatment with Bactrim for PCP and     toxoplasmosis and azithromycin for MAC.  Basic screening tests were     obtained since the patient has not had any workup in the past and were as    follows:  RPR for syphilis is negative.  Hepatitis panel was positive  for     hepatitis C.  Toxoplasmosis IgM and IgG were negative.  AFB smear was     negative x 2 and tuberculin test was placed and was negative since the     patient did not have any induration, and finally, cryptococcal antigen     test was negative.  The patient currently does not have any resources for     medication.  She has been given information about the [place] and is going to follow up with that.  She will also receive     prescriptions of  Truvada and Sustiva at hospital follow up and will be     setup with Idelle Crouch into the AIDs Drug Assistance Program from     where she can get her HIV medications.  The patient is very motivated to     stay compliant with the HIV medications.  She will also, during the     hospital followup visit, she will also be every set up to be followed up     by Dr. Orene Desanctis in the Infectious Disease Clinic at [facility].  3. Hepatitis C.  The patient was diagnosed with hepatitis C during this     hospitalization, and she will be setup at the Hepatitis C Clinic during     hospital followup.  4. Hypertension.  Blood pressure was found to be consistently elevated     during her hospitalization and thus the patient was started on 12.5 mg of     hydrochlorothiazide and blood pressure will be monitored as an outpatient     and medicines will be titrated as needed.   DISCHARGE LABORATORY DATA:  Sodium 136, potassium 3.7, chloride 112, bicarb  15, glucose 115, BUN 11, creatinine 1.4, calcium 8.1.  Hemoglobin 11.1 with  MCV 92.3, white blood cell count 5.5, and platelet count 296.   DISCHARGE VITAL SIGNS:  Temperature 98.4, pulse 87, respirations 20, blood  pressure 147/87, and O2 sats were 91% on room air.                                                Adella Hare, M.D.    PB/MEDQ  D:  08/17/2003  T:  08/18/2003  Job:  [id number]   cc:   [facility]

## 2010-08-14 ENCOUNTER — Other Ambulatory Visit (INDEPENDENT_AMBULATORY_CARE_PROVIDER_SITE_OTHER): Payer: Self-pay

## 2010-08-14 ENCOUNTER — Other Ambulatory Visit: Payer: Self-pay | Admitting: Infectious Diseases

## 2010-08-14 ENCOUNTER — Other Ambulatory Visit: Payer: Self-pay

## 2010-08-14 DIAGNOSIS — Z79899 Other long term (current) drug therapy: Secondary | ICD-10-CM

## 2010-08-14 DIAGNOSIS — Z113 Encounter for screening for infections with a predominantly sexual mode of transmission: Secondary | ICD-10-CM

## 2010-08-14 DIAGNOSIS — B20 Human immunodeficiency virus [HIV] disease: Secondary | ICD-10-CM

## 2010-08-14 LAB — CBC WITH DIFFERENTIAL/PLATELET
Basophils Absolute: 0 K/uL (ref 0.0–0.1)
Basophils Relative: 1 % (ref 0–1)
Eosinophils Absolute: 0.1 K/uL (ref 0.0–0.7)
Eosinophils Relative: 1 % (ref 0–5)
HCT: 36.3 % (ref 36.0–46.0)
Hemoglobin: 12.1 g/dL (ref 12.0–15.0)
Lymphocytes Relative: 23 % (ref 12–46)
Lymphs Abs: 1.3 K/uL (ref 0.7–4.0)
MCH: 33.6 pg (ref 26.0–34.0)
MCHC: 33.3 g/dL (ref 30.0–36.0)
MCV: 100.8 fL — ABNORMAL HIGH (ref 78.0–100.0)
Monocytes Absolute: 0.4 K/uL (ref 0.1–1.0)
Monocytes Relative: 8 % (ref 3–12)
Neutro Abs: 3.7 K/uL (ref 1.7–7.7)
Neutrophils Relative %: 67 % (ref 43–77)
Platelets: 232 K/uL (ref 150–400)
RBC: 3.6 MIL/uL — ABNORMAL LOW (ref 3.87–5.11)
RDW: 13.4 % (ref 11.5–15.5)
WBC: 5.6 K/uL (ref 4.0–10.5)

## 2010-08-14 LAB — LIPID PANEL
LDL Cholesterol: 80 mg/dL (ref 0–99)
Triglycerides: 108 mg/dL (ref <150)

## 2010-08-15 LAB — URINALYSIS, ROUTINE W REFLEX MICROSCOPIC
Ketones, ur: NEGATIVE mg/dL
Nitrite: NEGATIVE
Specific Gravity, Urine: 1.019 (ref 1.005–1.030)
Urobilinogen, UA: 0.2 mg/dL (ref 0.0–1.0)

## 2010-08-15 LAB — COMPLETE METABOLIC PANEL WITH GFR
ALT: 70 U/L — ABNORMAL HIGH (ref 0–35)
AST: 87 U/L — ABNORMAL HIGH (ref 0–37)
Alkaline Phosphatase: 129 U/L — ABNORMAL HIGH (ref 39–117)
Creat: 1.29 mg/dL — ABNORMAL HIGH (ref 0.50–1.10)
GFR, Est African American: 52 mL/min — ABNORMAL LOW (ref >60)
Total Bilirubin: 0.5 mg/dL (ref 0.3–1.2)

## 2010-08-15 LAB — RPR

## 2010-08-15 LAB — URINALYSIS, MICROSCOPIC ONLY
Casts: NONE SEEN
Crystals: NONE SEEN

## 2010-08-15 LAB — T-HELPER CELL (CD4) - (RCID CLINIC ONLY)
CD4 % Helper T Cell: 16 % — ABNORMAL LOW (ref 33–55)
CD4 T Cell Abs: 220 uL — ABNORMAL LOW (ref 400–2700)

## 2010-08-15 LAB — HIV-1 RNA QUANT-NO REFLEX-BLD: HIV-1 RNA Quant, Log: <1.3 {Log} (ref <1.30)

## 2010-08-18 ENCOUNTER — Other Ambulatory Visit: Payer: Self-pay | Admitting: Infectious Diseases

## 2010-08-18 DIAGNOSIS — B20 Human immunodeficiency virus [HIV] disease: Secondary | ICD-10-CM

## 2010-08-19 ENCOUNTER — Other Ambulatory Visit (INDEPENDENT_AMBULATORY_CARE_PROVIDER_SITE_OTHER): Payer: Self-pay

## 2010-08-19 DIAGNOSIS — B20 Human immunodeficiency virus [HIV] disease: Secondary | ICD-10-CM

## 2010-08-19 LAB — BASIC METABOLIC PANEL
BUN: 30 mg/dL — ABNORMAL HIGH (ref 6–23)
Calcium: 9.2 mg/dL (ref 8.4–10.5)
Glucose, Bld: 95 mg/dL (ref 70–99)

## 2010-08-29 ENCOUNTER — Other Ambulatory Visit: Payer: Self-pay | Admitting: Infectious Diseases

## 2010-08-29 DIAGNOSIS — G4700 Insomnia, unspecified: Secondary | ICD-10-CM

## 2010-08-29 DIAGNOSIS — I10 Essential (primary) hypertension: Secondary | ICD-10-CM

## 2010-09-17 ENCOUNTER — Emergency Department (HOSPITAL_COMMUNITY)
Admission: EM | Admit: 2010-09-17 | Discharge: 2010-09-17 | Discharge status: Against Medical Advice | Payer: Self-pay | Attending: Cardiology | Admitting: Cardiology

## 2010-09-17 ENCOUNTER — Encounter: Payer: Self-pay | Admitting: Infectious Diseases

## 2010-09-17 ENCOUNTER — Telehealth: Payer: Self-pay | Admitting: *Deleted

## 2010-09-17 ENCOUNTER — Ambulatory Visit: Payer: Self-pay

## 2010-09-17 ENCOUNTER — Ambulatory Visit (INDEPENDENT_AMBULATORY_CARE_PROVIDER_SITE_OTHER): Payer: Self-pay | Admitting: Infectious Diseases

## 2010-09-17 VITALS — BP 164/89 | HR 71 | Temp 98.2°F | Ht 63.0 in | Wt 169.0 lb

## 2010-09-17 DIAGNOSIS — B20 Human immunodeficiency virus [HIV] disease: Secondary | ICD-10-CM

## 2010-09-17 DIAGNOSIS — Z23 Encounter for immunization: Secondary | ICD-10-CM

## 2010-09-17 DIAGNOSIS — K029 Dental caries, unspecified: Secondary | ICD-10-CM

## 2010-09-17 DIAGNOSIS — F329 Major depressive disorder, single episode, unspecified: Secondary | ICD-10-CM

## 2010-09-17 DIAGNOSIS — M549 Dorsalgia, unspecified: Secondary | ICD-10-CM

## 2010-09-17 DIAGNOSIS — B1710 Acute hepatitis C without hepatic coma: Secondary | ICD-10-CM

## 2010-09-17 DIAGNOSIS — F99 Mental disorder, not otherwise specified: Secondary | ICD-10-CM | POA: Insufficient documentation

## 2010-09-17 DIAGNOSIS — R87619 Unspecified abnormal cytological findings in specimens from cervix uteri: Secondary | ICD-10-CM

## 2010-09-17 NOTE — Telephone Encounter (Signed)
Patient transferred by [facility] to [facility] for psych evaluation.  Once she is evaluated if they feel a pain RX is appropriate Dr. Johnnye Sima has agreed to prescribe Robaxin.  [name] was notified to call the clinic if they feel this is appropriate. [name] CMA

## 2010-09-17 NOTE — Assessment & Plan Note (Signed)
Will have her seen by ACT team today. Alyssa is not in.

## 2010-09-17 NOTE — Assessment & Plan Note (Signed)
Re-refer to GYN

## 2010-09-17 NOTE — Assessment & Plan Note (Signed)
Will get her into dental clinic.  

## 2010-09-17 NOTE — Assessment & Plan Note (Signed)
Her LFTs are stable at last blood draw, Hep A immune

## 2010-09-17 NOTE — Assessment & Plan Note (Signed)
She appears to be doing well. Will have her back in 3-4 months (other issues predominate now).

## 2010-09-17 NOTE — Assessment & Plan Note (Signed)
Will write her a Rx for robaxin if ok with act team

## 2010-09-17 NOTE — Progress Notes (Signed)
  Subjective:    Patient ID: Sharon Norris, female    DOB: [birth year], 55 y.o.   MRN: [id number]  HPI 55 yo F with hx of HIV+ since 1994 and Hep C (1a with prev VL 1,060,000). Was previously using IVDA and was previously incarcerated (d/c 12-09-06).  CD4 220, VL <20 (08-19-10).  AST 87, ALT 70, Cr  1.30 Currently taking EFV/EPZ.  Had PAP abn in November- had [facility] Bx 01-17-09. had f/u June 2011: ASCUS. Has not had f/u since.  Today c/o back spasms. Has not had any rx for this. Have had intermittent episodes.  States zoloft is not working, Microbiologist either (has been taking 3 at hs). Does not feel safe by herself anymore. Has had "some crazy thoughts" "regularly".    Review of Systems     Objective:   Physical Exam  Constitutional: She appears well-developed and well-nourished.  Eyes: EOM are normal. Pupils are equal, round, and reactive to light.  Neck: Neck supple.  Cardiovascular: Normal rate, regular rhythm and normal heart sounds.   Pulmonary/Chest: Effort normal and breath sounds normal.  Abdominal: Soft. Bowel sounds are normal. There is tenderness.  Musculoskeletal:       Arms: Lymphadenopathy:    She has no cervical adenopathy.  Psychiatric: Her mood appears anxious. She is agitated. She expresses suicidal ideation.          Assessment & Plan:

## 2010-09-25 ENCOUNTER — Telehealth: Payer: Self-pay | Admitting: *Deleted

## 2010-09-25 NOTE — Telephone Encounter (Signed)
Called patient to notify of appointment at [facility] clinic for 10/29/10 at 1:30 pm.  Left patient voicemail to call us back and verify she got this appointment information. [name] CMA

## 2010-10-29 ENCOUNTER — Encounter: Payer: Self-pay | Admitting: Advanced Practice Midwife

## 2010-10-29 ENCOUNTER — Telehealth: Payer: Self-pay | Admitting: *Deleted

## 2010-10-29 NOTE — Telephone Encounter (Signed)
Called patient to let her know she missed the appointment at [facility] for 10/29/10 and left the phone number [phone] for her to call and reschedule this appointment. [name] CMA

## 2010-11-07 LAB — T-HELPER CELL (CD4) - (RCID CLINIC ONLY): CD4 T Cell Abs: 80 — ABNORMAL LOW

## 2010-11-14 LAB — T-HELPER CELL (CD4) - (RCID CLINIC ONLY)
CD4 % Helper T Cell: 10 — ABNORMAL LOW
CD4 T Cell Abs: 140 — ABNORMAL LOW

## 2010-11-17 LAB — DIFFERENTIAL
Basophils Absolute: 0
Eosinophils Absolute: 0.1
Eosinophils Relative: 1
Lymphocytes Relative: 22
Lymphs Abs: 1.4
Neutrophils Relative %: 68

## 2010-11-17 LAB — CBC
Hemoglobin: 13.4
MCHC: 34
RBC: 3.98

## 2010-11-17 LAB — T-HELPER CELL (CD4) - (RCID CLINIC ONLY)
CD4 % Helper T Cell: 10 — ABNORMAL LOW
CD4 T Cell Abs: 130 — ABNORMAL LOW

## 2010-11-25 LAB — APTT: aPTT: 22 — ABNORMAL LOW

## 2010-11-25 LAB — I-STAT EC8
Acid-base deficit: 5 — ABNORMAL HIGH
Chloride: 112
HCT: 14 — ABNORMAL LOW
Hemoglobin: 4.8 — CL
Operator id: 272551
Potassium: 4.2
Sodium: 135
TCO2: 16
pH, Arterial: 7.651

## 2010-11-25 LAB — DIFFERENTIAL
Basophils Absolute: 0
Basophils Absolute: 0
Basophils Relative: 1
Eosinophils Absolute: 0 — ABNORMAL LOW
Eosinophils Absolute: 0 — ABNORMAL LOW
Lymphocytes Relative: 17
Lymphocytes Relative: 22
Monocytes Relative: 8
Neutro Abs: 3.4
Neutro Abs: 2.4
Neutrophils Relative %: 68

## 2010-11-25 LAB — CROSSMATCH: ABO/RH(D): A POS

## 2010-11-25 LAB — RAPID URINE DRUG SCREEN, HOSP PERFORMED
Barbiturates: NOT DETECTED
Cocaine: NOT DETECTED
Opiates: NOT DETECTED

## 2010-11-25 LAB — URINALYSIS, ROUTINE W REFLEX MICROSCOPIC
Bilirubin Urine: NEGATIVE
Ketones, ur: NEGATIVE
Specific Gravity, Urine: 1.008
pH: 6.5

## 2010-11-25 LAB — BASIC METABOLIC PANEL
BUN: 24 — ABNORMAL HIGH
Creatinine, Ser: 1.1
GFR calc non Af Amer: 52 — ABNORMAL LOW

## 2010-11-25 LAB — CULTURE, BLOOD (ROUTINE X 2): Culture: NO GROWTH

## 2010-11-25 LAB — CBC
HCT: 24 — ABNORMAL LOW
Hemoglobin: 8.4 — ABNORMAL LOW
MCHC: 34.9
MCV: 100.9 — ABNORMAL HIGH
MCV: 101.3 — ABNORMAL HIGH
MCV: 115.5 — ABNORMAL HIGH
Platelets: 280
Platelets: 291
Platelets: 323
Platelets: UNDETERMINED
RBC: 2.37 — ABNORMAL LOW
WBC: 2.6 — ABNORMAL LOW
WBC: 2.6 — ABNORMAL LOW
WBC: 4.7
WBC: 3.5 — ABNORMAL LOW

## 2010-11-25 LAB — IRON AND TIBC

## 2010-11-25 LAB — PROTIME-INR: Prothrombin Time: 13.1

## 2010-11-25 LAB — URINE MICROSCOPIC-ADD ON

## 2010-11-25 LAB — TSH: TSH: 2.339

## 2010-11-25 LAB — COMPREHENSIVE METABOLIC PANEL
Albumin: 3.1 — ABNORMAL LOW
Albumin: 3.2 — ABNORMAL LOW
Alkaline Phosphatase: 179 — ABNORMAL HIGH
Alkaline Phosphatase: 176 — ABNORMAL HIGH
BUN: 28 — ABNORMAL HIGH
BUN: 28 — ABNORMAL HIGH
CO2: 19
Chloride: 108
Creatinine, Ser: 1.17
GFR calc non Af Amer: 44 — ABNORMAL LOW
Glucose, Bld: 118 — ABNORMAL HIGH
Glucose, Bld: 124 — ABNORMAL HIGH
Potassium: 3.8
Potassium: 3.9
Total Bilirubin: 1.9 — ABNORMAL HIGH
Total Protein: 6.7

## 2010-11-25 LAB — RETICULOCYTES
RBC.: 0.64 — ABNORMAL LOW
Retic Ct Pct: 0.4

## 2010-11-25 LAB — VITAMIN B12: Vitamin B-12: 368 (ref 211–911)

## 2010-11-25 LAB — POCT I-STAT CREATININE
Creatinine, Ser: 1.4 — ABNORMAL HIGH
Operator id: 272551

## 2010-11-25 LAB — URINE CULTURE: Special Requests: NEGATIVE

## 2010-11-25 LAB — ABO/RH: ABO/RH(D): A POS

## 2010-11-25 LAB — B-NATRIURETIC PEPTIDE (CONVERTED LAB): Pro B Natriuretic peptide (BNP): 79

## 2010-11-25 LAB — T-HELPER CELLS (CD4) COUNT (NOT AT ARMC)
CD4 % Helper T Cell: 8 — ABNORMAL LOW
CD4 T Cell Abs: 60 — ABNORMAL LOW

## 2010-11-25 LAB — T-HELPER CELL (CD4) - (RCID CLINIC ONLY): CD4 T Cell Abs: 60 — ABNORMAL LOW

## 2010-11-25 LAB — LACTATE DEHYDROGENASE: LDH: 251 — ABNORMAL HIGH

## 2010-11-25 LAB — HIV-1 GENOTYPR PLUS

## 2010-11-25 LAB — PREPARE RBC (CROSSMATCH)

## 2010-11-25 LAB — FOLATE: Folate: 11.1

## 2011-01-05 ENCOUNTER — Other Ambulatory Visit: Payer: Self-pay | Admitting: *Deleted

## 2011-01-05 DIAGNOSIS — B20 Human immunodeficiency virus [HIV] disease: Secondary | ICD-10-CM

## 2011-01-05 MED ORDER — EFAVIRENZ 600 MG PO TABS: 600.0000 mg | ORAL_TABLET | Freq: Every day | ORAL | Status: DC

## 2011-01-05 MED ORDER — ABACAVIR SULFATE-LAMIVUDINE 600-300 MG PO TABS: 1.0000 | ORAL_TABLET | Freq: Every day | ORAL | Status: DC

## 2011-01-06 ENCOUNTER — Other Ambulatory Visit: Payer: Self-pay

## 2011-01-06 ENCOUNTER — Telehealth: Payer: Self-pay | Admitting: *Deleted

## 2011-01-06 NOTE — Telephone Encounter (Signed)
I left a message asking her to call so we can schedule appts. Needs labs-missed today's & needs a pap

## 2011-01-21 ENCOUNTER — Ambulatory Visit: Payer: Self-pay | Admitting: Infectious Diseases

## 2011-01-21 ENCOUNTER — Telehealth: Payer: Self-pay | Admitting: *Deleted

## 2011-01-21 NOTE — Telephone Encounter (Signed)
I left her a message asking her to call back & reschedule as she had missed her appt

## 2011-02-25 ENCOUNTER — Other Ambulatory Visit: Payer: Self-pay | Admitting: Infectious Diseases

## 2011-02-25 ENCOUNTER — Other Ambulatory Visit (INDEPENDENT_AMBULATORY_CARE_PROVIDER_SITE_OTHER): Payer: Self-pay

## 2011-02-25 DIAGNOSIS — B20 Human immunodeficiency virus [HIV] disease: Secondary | ICD-10-CM

## 2011-02-25 DIAGNOSIS — Z79899 Other long term (current) drug therapy: Secondary | ICD-10-CM

## 2011-02-25 DIAGNOSIS — Z113 Encounter for screening for infections with a predominantly sexual mode of transmission: Secondary | ICD-10-CM

## 2011-02-26 LAB — GC/CHLAMYDIA PROBE AMP, URINE
Chlamydia, Swab/Urine, PCR: NEGATIVE
GC Probe Amp, Urine: NEGATIVE

## 2011-02-26 LAB — COMPLETE METABOLIC PANEL WITH GFR
BUN: 31 mg/dL — ABNORMAL HIGH (ref 6–23)
CO2: 14 mEq/L — ABNORMAL LOW (ref 19–32)
Calcium: 9.3 mg/dL (ref 8.4–10.5)
Chloride: 109 mEq/L (ref 96–112)
Creat: 1.23 mg/dL — ABNORMAL HIGH (ref 0.50–1.10)
GFR, Est African American: 56 mL/min — ABNORMAL LOW
GFR, Est Non African American: 49 mL/min — ABNORMAL LOW
Glucose, Bld: 97 mg/dL (ref 70–99)

## 2011-02-26 LAB — CBC WITH DIFFERENTIAL/PLATELET
Eosinophils Relative: 1 % (ref 0–5)
HCT: 37.8 % (ref 36.0–46.0)
Lymphocytes Relative: 22 % (ref 12–46)
Lymphs Abs: 1.6 10*3/uL (ref 0.7–4.0)
MCH: 34.1 pg — ABNORMAL HIGH (ref 26.0–34.0)
MCV: 100.8 fL — ABNORMAL HIGH (ref 78.0–100.0)
Monocytes Absolute: 0.7 10*3/uL (ref 0.1–1.0)
RBC: 3.75 MIL/uL — ABNORMAL LOW (ref 3.87–5.11)
WBC: 7.4 10*3/uL (ref 4.0–10.5)

## 2011-02-26 LAB — URINALYSIS, ROUTINE W REFLEX MICROSCOPIC
Bilirubin Urine: NEGATIVE
Glucose, UA: NEGATIVE mg/dL
Ketones, ur: NEGATIVE mg/dL
Protein, ur: NEGATIVE mg/dL
Specific Gravity, Urine: 1.019 (ref 1.005–1.030)
Urobilinogen, UA: 1 mg/dL (ref 0.0–1.0)

## 2011-02-26 LAB — T-HELPER CELL (CD4) - (RCID CLINIC ONLY): CD4 % Helper T Cell: 15 % — ABNORMAL LOW (ref 33–55)

## 2011-02-26 LAB — LIPID PANEL: HDL: 78 mg/dL (ref >39)

## 2011-02-26 LAB — URINALYSIS, MICROSCOPIC ONLY
Crystals: NONE SEEN
Squamous Epithelial / LPF: NONE SEEN

## 2011-02-27 LAB — HIV-1 RNA QUANT-NO REFLEX-BLD
HIV 1 RNA Quant: 37 copies/mL — ABNORMAL HIGH (ref <20)
HIV-1 RNA Quant, Log: 1.57 {Log} — ABNORMAL HIGH (ref <1.30)

## 2011-03-11 ENCOUNTER — Ambulatory Visit: Payer: Self-pay

## 2011-03-11 ENCOUNTER — Encounter: Payer: Self-pay | Admitting: Infectious Diseases

## 2011-03-11 ENCOUNTER — Ambulatory Visit (INDEPENDENT_AMBULATORY_CARE_PROVIDER_SITE_OTHER): Payer: Self-pay | Admitting: Infectious Diseases

## 2011-03-11 VITALS — BP 142/91 | HR 67 | Temp 98.0°F | Ht 63.0 in | Wt 179.8 lb

## 2011-03-11 DIAGNOSIS — M549 Dorsalgia, unspecified: Secondary | ICD-10-CM

## 2011-03-11 DIAGNOSIS — N289 Disorder of kidney and ureter, unspecified: Secondary | ICD-10-CM | POA: Insufficient documentation

## 2011-03-11 DIAGNOSIS — B1710 Acute hepatitis C without hepatic coma: Secondary | ICD-10-CM

## 2011-03-11 DIAGNOSIS — F329 Major depressive disorder, single episode, unspecified: Secondary | ICD-10-CM

## 2011-03-11 DIAGNOSIS — R87619 Unspecified abnormal cytological findings in specimens from cervix uteri: Secondary | ICD-10-CM

## 2011-03-11 DIAGNOSIS — B20 Human immunodeficiency virus [HIV] disease: Secondary | ICD-10-CM

## 2011-03-11 DIAGNOSIS — Z0000 Encounter for general adult medical examination without abnormal findings: Secondary | ICD-10-CM

## 2011-03-11 DIAGNOSIS — Z23 Encounter for immunization: Secondary | ICD-10-CM

## 2011-03-11 DIAGNOSIS — K029 Dental caries, unspecified: Secondary | ICD-10-CM

## 2011-03-11 MED ORDER — DIVALPROEX SODIUM ER 500 MG PO TB24: 500.0000 mg | ORAL_TABLET | Freq: Every day | ORAL | Status: DC

## 2011-03-11 MED ORDER — GABAPENTIN 100 MG PO CAPS: 100.0000 mg | ORAL_CAPSULE | Freq: Every day | ORAL | Status: DC

## 2011-03-11 MED ORDER — DIVALPROEX SODIUM 250 MG PO DR TAB: 250.0000 mg | DELAYED_RELEASE_TABLET | Freq: Every day | ORAL | Status: DC

## 2011-03-11 MED ORDER — TRAZODONE HCL 100 MG PO TABS: 100.0000 mg | ORAL_TABLET | Freq: Every day | ORAL | Status: DC

## 2011-03-11 MED ORDER — GABAPENTIN 300 MG PO CAPS: 300.0000 mg | ORAL_CAPSULE | Freq: Every day | ORAL | Status: DC

## 2011-03-11 NOTE — Progress Notes (Signed)
  Subjective:    Patient ID: Sharon Norris, female    DOB: 06/01/1955, 56 y.o.   MRN: [id number]  HPI 56 yo F with hx of HIV+ since 1994 and Hep C (1a with prev VL 1,060,000). Was previously using IVDA and was previously incarcerated (d/c 12-09-06).  Last CD4 260, VL 37 (02-25-11).  AST 73, ALT 66, Cr 1.23 CO2 14. Currently taking EFV/EPZ.  Had PAP abn previously- had [facility] Bx 01-17-09. had f/u June 2011: ASCUS. Has not had repeat.  Since last visit has been seeing mental health weekly, started on depakote, trazadone, off zoloft. Has some overwhelming, anxious feelings still. Still on ambien. Has been having excruciating back pain. Using a neighbors cane. Having tingling feeling in feet, as well as hands now. MRI LS spine (2009): Ordinary lower lumbar degenerative facet disease and minor disc bulges. The findings could certainly relate to back pain, but there is no sign of neural compression. Her teeth are cracking and falling out.     Review of Systems See HPI    Objective:   Physical Exam  Constitutional: She appears well-developed and well-nourished.  HENT:  Head: Normocephalic.  Mouth/Throat: Abnormal dentition. Dental caries present. No oropharyngeal exudate.  Eyes: EOM are normal. Pupils are equal, round, and reactive to light.  Neck: Neck supple.  Cardiovascular: Normal rate, regular rhythm and normal heart sounds.   Pulmonary/Chest: Effort normal and breath sounds normal.  Abdominal: Soft. Bowel sounds are normal. There is no tenderness.  Lymphadenopathy:    She has no cervical adenopathy.  Neurological:       Decreased light touch BLE. Decreased extensor strength (quads), BLE.   Psychiatric: She has a normal mood and affect.          Assessment & Plan:

## 2011-03-11 NOTE — Assessment & Plan Note (Signed)
She is Hep A immune. Will recheck her Hep B S Ab.

## 2011-03-11 NOTE — Assessment & Plan Note (Signed)
Not sure why she is acidotic. Will ask renal to help.

## 2011-03-11 NOTE — Assessment & Plan Note (Signed)
I am not sure how much of this is her neuropathy or possible spinal disease. Will repeat her MRI of LS spine.

## 2011-03-11 NOTE — Assessment & Plan Note (Signed)
She is doing well. Will get her into dental clinic. She is given condoms, not sexually active. She has no problems with her ART. Will see her back in 6 months with labs.

## 2011-03-11 NOTE — Assessment & Plan Note (Signed)
Have her seen by dental.

## 2011-03-11 NOTE — Assessment & Plan Note (Signed)
Has been seen by mental health . Appreciate their partnering with [place].

## 2011-03-11 NOTE — Assessment & Plan Note (Addendum)
Will have her seen by GYN. Also needs mammo

## 2011-03-12 ENCOUNTER — Telehealth: Payer: Self-pay | Admitting: *Deleted

## 2011-03-12 ENCOUNTER — Ambulatory Visit: Payer: Self-pay

## 2011-03-12 DIAGNOSIS — F329 Major depressive disorder, single episode, unspecified: Secondary | ICD-10-CM

## 2011-03-12 DIAGNOSIS — G629 Polyneuropathy, unspecified: Secondary | ICD-10-CM

## 2011-03-12 MED ORDER — GABAPENTIN 300 MG PO CAPS: 300.0000 mg | ORAL_CAPSULE | Freq: Every day | ORAL | Status: DC

## 2011-03-12 MED ORDER — GABAPENTIN 100 MG PO CAPS: 100.0000 mg | ORAL_CAPSULE | Freq: Every day | ORAL | Status: DC

## 2011-03-12 MED ORDER — DIVALPROEX SODIUM 250 MG PO DR TAB: 250.0000 mg | DELAYED_RELEASE_TABLET | Freq: Every day | ORAL | Status: DC

## 2011-03-12 MED ORDER — DIVALPROEX SODIUM ER 500 MG PO TB24: 500.0000 mg | ORAL_TABLET | Freq: Every day | ORAL | Status: DC

## 2011-03-12 NOTE — Telephone Encounter (Signed)
I gave her the appts in writing & told her when to expect call from the different places she has been referred to. States she has found a ride to [employer]. I will start the referral process to get her into a nephrologist office & call her when I have a date & time.

## 2011-03-12 NOTE — Telephone Encounter (Signed)
Mailed application to [employer] to Care for Gabapentin today.

## 2011-03-12 NOTE — Telephone Encounter (Signed)
I called the trazadone in to the health dept/MAP

## 2011-03-12 NOTE — Telephone Encounter (Signed)
She completed the mammogram scholarship form & it was mailed to [facility] She completed the dental form & it was given to the people who handle appts. I called [facility] about an appt for f/u on PAP. The order is in the system & I was told they will call the pt when they make the appt. There is nothing before March. I discussed the nephrology appt with the pt. She does not have any insurance or money. No ride to [facility] to go to [facility]. Discussed with md.  Sent email to Susette Racer with internal medicine to make an appt for this pt   She has an appt today with Pamala Hurry to see if she qualifies for an orange card She will see Jasmine December after that to get the PAP forms done for depakote,trazadone & neurontin. Forms are printed & on Pam's desk  Her appt for the mri is next wed 03/18/11 at [facility] at 1:45pm. Pt is aware of this.  I will speak with her today about the progress on the above referrals

## 2011-03-12 NOTE — Telephone Encounter (Signed)
rxs printed for pt assistance programs & given to Dr. Tommy Medal to sign

## 2011-03-18 ENCOUNTER — Ambulatory Visit (HOSPITAL_COMMUNITY)
Admission: RE | Admit: 2011-03-18 | Discharge: 2011-03-18 | Disposition: A | Discharge status: Home / Self Care | Payer: Self-pay | Source: Ambulatory Visit | Attending: Infectious Diseases | Admitting: Infectious Diseases

## 2011-03-18 DIAGNOSIS — M5137 Other intervertebral disc degeneration, lumbosacral region: Secondary | ICD-10-CM | POA: Insufficient documentation

## 2011-03-18 DIAGNOSIS — M51379 Other intervertebral disc degeneration, lumbosacral region without mention of lumbar back pain or lower extremity pain: Secondary | ICD-10-CM | POA: Insufficient documentation

## 2011-03-18 DIAGNOSIS — M549 Dorsalgia, unspecified: Secondary | ICD-10-CM

## 2011-03-19 ENCOUNTER — Telehealth: Payer: Self-pay | Admitting: *Deleted

## 2011-03-19 NOTE — Telephone Encounter (Signed)
Pt lost her phone.  Please send appt information to her home.  Thinks that she has upcoming appts for mammogram and "kidney" MD.

## 2011-03-25 ENCOUNTER — Other Ambulatory Visit: Payer: Self-pay | Admitting: *Deleted

## 2011-03-25 DIAGNOSIS — F329 Major depressive disorder, single episode, unspecified: Secondary | ICD-10-CM

## 2011-03-25 DIAGNOSIS — B20 Human immunodeficiency virus [HIV] disease: Secondary | ICD-10-CM

## 2011-03-25 MED ORDER — DIVALPROEX SODIUM ER 500 MG PO TB24: 500.0000 mg | ORAL_TABLET | Freq: Every day | ORAL | Status: DC

## 2011-03-25 MED ORDER — DIVALPROEX SODIUM 250 MG PO DR TAB: 250.0000 mg | DELAYED_RELEASE_TABLET | Freq: Every day | ORAL | Status: DC

## 2011-03-25 NOTE — Telephone Encounter (Signed)
Lm for pt to plz call us. When she does, will tell her meds are in

## 2011-03-26 ENCOUNTER — Encounter: Payer: Self-pay | Admitting: *Deleted

## 2011-03-26 ENCOUNTER — Telehealth: Payer: Self-pay | Admitting: *Deleted

## 2011-03-26 NOTE — Telephone Encounter (Signed)
I rec'd an email; back from Adria Dill at internal medicine that the pt's initial appt will be on 04/08/11 at 3:15. Her cell has been d/c. The work number would not put me thru or leave her a message. I sent a letter to the pt

## 2011-04-01 ENCOUNTER — Other Ambulatory Visit: Payer: Self-pay | Admitting: Infectious Diseases

## 2011-04-01 ENCOUNTER — Telehealth: Payer: Self-pay | Admitting: *Deleted

## 2011-04-01 DIAGNOSIS — R87619 Unspecified abnormal cytological findings in specimens from cervix uteri: Secondary | ICD-10-CM

## 2011-04-01 NOTE — Telephone Encounter (Signed)
Spoke with [facility] [phone]) to find out if pt has appointment. They had not made appointment due to the referral notes stating she did not have a ride to [place] spoke with patient on 03/12/11 and patient stated she had a ride to [facility]. I notified [facility] of this and they stated they would call back with an appointment. I gave them my number [phone]. If I do not hear back today will call tomorrow. Eliezer Champagne RN

## 2011-04-08 ENCOUNTER — Telehealth: Payer: Self-pay | Admitting: *Deleted

## 2011-04-08 ENCOUNTER — Encounter: Payer: Self-pay | Admitting: Internal Medicine

## 2011-04-15 ENCOUNTER — Encounter: Payer: Self-pay | Admitting: *Deleted

## 2011-04-15 ENCOUNTER — Other Ambulatory Visit: Payer: Self-pay | Admitting: *Deleted

## 2011-04-15 DIAGNOSIS — G629 Polyneuropathy, unspecified: Secondary | ICD-10-CM

## 2011-04-15 MED ORDER — GABAPENTIN 300 MG PO CAPS: 300.0000 mg | ORAL_CAPSULE | Freq: Every day | ORAL | Status: DC

## 2011-04-15 MED ORDER — GABAPENTIN 100 MG PO CAPS: 100.0000 mg | ORAL_CAPSULE | Freq: Every day | ORAL | Status: DC

## 2011-04-20 ENCOUNTER — Encounter: Payer: Self-pay | Admitting: *Deleted

## 2011-04-20 NOTE — Progress Notes (Signed)
Patient ID: Sharon Norris, female   DOB: Dec 10, 1955, 56 y.o.   MRN: [id number]  Pt picked up Depakote and Neurontin Patient Assistance medications.

## 2011-04-29 ENCOUNTER — Other Ambulatory Visit: Payer: Self-pay | Admitting: Infectious Diseases

## 2011-04-29 DIAGNOSIS — Z1231 Encounter for screening mammogram for malignant neoplasm of breast: Secondary | ICD-10-CM

## 2011-05-04 ENCOUNTER — Encounter: Payer: Self-pay | Admitting: Physician Assistant

## 2011-05-05 ENCOUNTER — Telehealth: Payer: Self-pay | Admitting: *Deleted

## 2011-05-05 ENCOUNTER — Ambulatory Visit (HOSPITAL_COMMUNITY): Payer: Self-pay

## 2011-05-05 ENCOUNTER — Encounter: Payer: Self-pay | Admitting: *Deleted

## 2011-05-05 NOTE — Telephone Encounter (Signed)
States she will be here Friday & wants a copy of her med list. I told her it will be at the front

## 2011-05-07 ENCOUNTER — Encounter: Payer: Self-pay | Admitting: Obstetrics & Gynecology

## 2011-05-18 ENCOUNTER — Other Ambulatory Visit: Payer: Self-pay | Admitting: *Deleted

## 2011-05-18 DIAGNOSIS — B20 Human immunodeficiency virus [HIV] disease: Secondary | ICD-10-CM

## 2011-05-18 MED ORDER — ABACAVIR SULFATE-LAMIVUDINE 600-300 MG PO TABS: 1.0000 | ORAL_TABLET | Freq: Every day | ORAL | Status: DC

## 2011-05-18 MED ORDER — EFAVIRENZ 600 MG PO TABS: 600.0000 mg | ORAL_TABLET | Freq: Every day | ORAL | Status: DC

## 2011-05-27 ENCOUNTER — Other Ambulatory Visit: Payer: Self-pay | Admitting: Licensed Clinical Social Worker

## 2011-05-27 DIAGNOSIS — I10 Essential (primary) hypertension: Secondary | ICD-10-CM

## 2011-05-27 MED ORDER — LISINOPRIL 30 MG PO TABS: 30.0000 mg | ORAL_TABLET | Freq: Every day | ORAL | Status: DC

## 2011-05-27 MED ORDER — CLONIDINE HCL 0.1 MG PO TABS: 0.1000 mg | ORAL_TABLET | Freq: Two times a day (BID) | ORAL | Status: DC

## 2011-06-11 ENCOUNTER — Ambulatory Visit (INDEPENDENT_AMBULATORY_CARE_PROVIDER_SITE_OTHER): Payer: Self-pay | Admitting: Physician Assistant

## 2011-06-11 ENCOUNTER — Ambulatory Visit (HOSPITAL_COMMUNITY)
Admission: RE | Admit: 2011-06-11 | Discharge: 2011-06-11 | Disposition: A | Discharge status: Home / Self Care | Payer: Self-pay | Source: Ambulatory Visit | Attending: Infectious Diseases | Admitting: Infectious Diseases

## 2011-06-11 ENCOUNTER — Encounter: Payer: Self-pay | Admitting: Physician Assistant

## 2011-06-11 VITALS — BP 169/99 | HR 83 | Temp 97.1°F | Ht 64.0 in | Wt 180.9 lb

## 2011-06-11 DIAGNOSIS — Z8742 Personal history of other diseases of the female genital tract: Secondary | ICD-10-CM

## 2011-06-11 DIAGNOSIS — Z1231 Encounter for screening mammogram for malignant neoplasm of breast: Secondary | ICD-10-CM | POA: Insufficient documentation

## 2011-06-11 DIAGNOSIS — Z87898 Personal history of other specified conditions: Secondary | ICD-10-CM

## 2011-06-11 DIAGNOSIS — F1991 Other psychoactive substance use, unspecified, in remission: Secondary | ICD-10-CM | POA: Insufficient documentation

## 2011-06-11 DIAGNOSIS — Z01419 Encounter for gynecological examination (general) (routine) without abnormal findings: Secondary | ICD-10-CM

## 2011-06-11 DIAGNOSIS — N951 Menopausal and female climacteric states: Secondary | ICD-10-CM

## 2011-06-11 NOTE — Progress Notes (Signed)
Referred here for history of abnormal pap smear in 2010 , had colposcopy 2010. Discussed patient elevated bp today, patient states has missed a few doses, needs to get her refill. We discussed importance of taking bp meds and risks of not taking.

## 2011-06-11 NOTE — Progress Notes (Signed)
Chief Complaint:  Abnormal Pap Smear and Vaginal dryness   Sharon Norris is  56 y.o. G3P2010.  No LMP recorded. Patient is postmenopausal..  She presents complaining of Abnormal Pap Smear and Vaginal dryness  Pt presents for pap smear after hx of abnormal in 2010. PCP: Dr. Johnnye Sima in [place]. Next appt 1 week. Reports vaginal dryness, hot flashes, and mood swings. Desires s/s management. Pt reports she has not take BP meds in several days due to being out. States current Rx at pharmacy, needs to refill and pick up.  Obstetrical/Gynecological History: OB History    Grav Para Term Preterm Abortions TAB SAB Ect Mult Living   3 2 2  1 1           Past Medical History: Past Medical History  Diagnosis Date  . HIV infection   . Hypertension   . Neuropathy   . Anxiety   . Depression   . Sleep difficulties   . H/O drug abuse     IV drugs, drug free 12 years    Past Surgical History: Past Surgical History  Procedure Date  . Colposcopy 2010    Family History: Family History  Problem Relation Age of Onset  . Alcohol abuse Father   . Cancer Maternal Aunt     endometrial  . Cancer Sister     endometrial  . Cancer Sister     breast    Social History: History  Substance Use Topics  . Smoking status: Former Smoker    Quit date: 02/17/2000  . Smokeless tobacco: Never Used  . Alcohol Use: 0.5 oz/week    1 drink(s) per week     beer    Allergies: No Known Allergies   (Not in a hospital admission)  Review of Systems - Negative except what has been reviewed in the HPI  Physical Exam   Blood pressure 169/99, pulse 83, temperature 97.1 F (36.2 C), temperature source Oral, height 5\' 4"  (1.626 m), weight 180 lb 14.4 oz (82.056 kg).  General: General appearance - alert, well appearing, and in no distress and oriented to person, place, and time Mental status - alert, oriented to person, place, and time, normal mood, behavior, speech, dress, motor activity, and thought processes,  affect appropriate to mood Abdomen - soft, nontender, nondistended, no masses or organomegaly Breasts - breasts appear normal, no suspicious masses, no skin or nipple changes or axillary nodes Extremities - peripheral pulses normal, no pedal edema, no clubbing or cyanosis Focused Gynecological Exam: VULVA: normal appearing vulva with no masses, tenderness or lesions, ?urethral diverticulum, slight fullness of urethra with bimanual exam, non tender VAGINA: normal appearing vagina with normal color and discharge, no lesions, CERVIX: normal appearing cervix without discharge or lesions, UTERUS: uterus is normal size, shape, consistency and nontender, ADNEXA: normal adnexa in size, nontender and no masses.  Assessment: 1. Hx of abnormal cells from cervix  Cytology - PAP  2. Menopausal symptoms    Hypertension    Plan: Pap obtained and sent per routine Mammogram today Reviewed treatment options for menopausal s/s. Given hypertension today, HRT therapy not option. Reviewed comfort measures and recommend Replense OTC q 2-3 days for vaginal dryness as this is main concern. Pt encouraged to fill Rx for BP meds and take as directed.  RTC in 8 weeks for re-check of s/s and BP. Will consider HRT with good BP control. Pt verbalizes understanding.  Roland. 06/11/2011,1:54 PM

## 2011-06-11 NOTE — Patient Instructions (Signed)
Replense: use 2-3 times wkly as directed on the box instructions    Hormone Therapy At menopause, your body begins making less estrogen and progesterone hormones. This causes the body to stop having menstrual periods. This is because estrogen and progesterone hormones control your periods and menstrual cycle. A lack of estrogen may cause symptoms such as:  Hot flushes (or hot flashes).   Vaginal dryness.   Dry skin.   Loss of sex drive.   Risk of bone loss (osteoporosis).  When this happens, you may choose to take hormone therapy to get back the estrogen lost during menopause. When the hormone estrogen is given alone, it is usually referred to as ET (Estrogen Therapy). When the hormone progestin is combined with estrogen, it is generally called HT (Hormone Therapy). This was formerly known as hormone replacement therapy (HRT). Your caregiver can help you make a decision on what will be best for you. The decision to use HT seems to change often as new studies are done. Many studies do not agree on the benefits of hormone replacement therapy. LIKELY BENEFITS OF HT INCLUDE PROTECTION FROM:  Hot Flushes (also called hot flashes) - A hot flush is a sudden feeling of heat that spreads over the face and body. The skin may redden like a blush. It is connected with sweats and sleep disturbance. Women going through menopause may have hot flushes a few times a month or several times per day depending on the woman.   Osteoporosis (bone loss)- Estrogen helps guard against bone loss. After menopause, a woman's bones slowly lose calcium and become weak and brittle. As a result, bones are more likely to break. The hip, wrist, and spine are affected most often. Hormone therapy can help slow bone loss after menopause. Weight bearing exercise and taking calcium with vitamin D also can help prevent bone loss. There are also medications that your caregiver can prescribe that can help prevent osteoporosis.    Vaginal Dryness - Loss of estrogen causes changes in the vagina. Its lining may become thin and dry. These changes can cause pain and bleeding during sexual intercourse. Dryness can also lead to infections. This can cause burning and itching. (Vaginal estrogen treatment can help relieve pain, itching, and dryness.)   Urinary Tract Infections are more common after menopause because of lack of estrogen. Some women also develop urinary incontinence because of low estrogen levels in the vagina and bladder.   Possible other benefits of estrogen include a positive effect on mood and short-term memory in women.  RISKS AND COMPLICATIONS  Using estrogen alone without progesterone causes the lining of the uterus to grow. This increases the risk of lining of the uterus (endometrial) cancer. Your caregiver should give another hormone called progestin if you have a uterus.   Women who take combined (estrogen and progestin) HT appear to have an increased risk of breast cancer. The risk appears to be small, but increases throughout the time that HT is taken.   Combined therapy also makes the breast tissue slightly denser which makes it harder to read mammograms (breast X-rays).   Combined, estrogen and progesterone therapy can be taken together every day, in which case there may be spotting of blood. HT therapy can be taken cyclically in which case you will have menstrual periods. Cyclically means HT is taken for a set amount of days, then not taken, then this process is repeated.   HT may increase the risk of stroke, heart attack, breast cancer and  forming blood clots in your leg.   Transdermal estrogen (estrogen that is absorbed through the skin with a patch or a cream) may have more positive results with:   Cholesterol.   Blood pressure.   Blood clots.  Having the following conditions may indicate you should not have HT:  Endometrial cancer.   Liver disease.   Breast cancer.   Heart disease.    History of blood clots.   Stroke.  TREATMENT   If you choose to take HT and have a uterus, usually estrogen and progestin are prescribed.   Your caregiver will help you decide the best way to take the medications.   Possible ways to take estrogen include:   Pills.   Patches.   Gels.   Sprays.   Vaginal estrogen cream, rings and tablets.   It is best to take the lowest dose possible that will help your symptoms and take them for the shortest period of time that you can.   Hormone therapy can help relieve some of the problems (symptoms) that affect women at menopause. Before making a decision about HT, talk to your caregiver about what is best for you. Be well informed and comfortable with your decisions.  HOME CARE INSTRUCTIONS   Follow your caregivers advice when taking the medications.   A Pap test is done to screen for cervical cancer.   The first Pap test should be done at age 11.   Between ages 84 and 99, Pap tests are repeated every 2 years.   Beginning at age 48, you are advised to have a Pap test every 3 years as long as your past 3 Pap tests have been normal.   Some women have medical problems that increase the chance of getting cervical cancer. Talk to your caregiver about these problems. It is especially important to talk to your caregiver if a new problem develops soon after your last Pap test. In these cases, your caregiver may recommend more frequent screening and Pap tests.   The above recommendations are the same for women who have or have not gotten the vaccine for HPV (Human Papillomavirus).   If you had a hysterectomy for a problem that was not a cancer or a condition that could lead to cancer, then you no longer need Pap tests. However, even if you no longer need a Pap test, a regular exam is a good idea to make sure no other problems are starting.    If you are between ages 36 and 36, and you have had normal Pap tests going back 10 years, you no  longer need Pap tests. However, even if you no longer need a Pap test, a regular exam is a good idea to make sure no other problems are starting.    If you have had past treatment for cervical cancer or a condition that could lead to cancer, you need Pap tests and screening for cancer for at least 20 years after your treatment.   If Pap tests have been discontinued, risk factors (such as a new sexual partner) need to be re-assessed to determine if screening should be resumed.   Some women may need screenings more often if they are at high risk for cervical cancer.   Get mammograms done as per the advice of your caregiver.  SEEK IMMEDIATE MEDICAL CARE IF:  You develop abnormal vaginal bleeding.   You have pain or swelling in your legs, shortness of breath, or chest pain.   You develop  dizziness or headaches.   You have lumps or changes in your breasts or armpits.   You have slurred speech.   You develop weakness or numbness of your arms or legs.   You have pain, burning, or bleeding when urinating.   You develop abdominal pain.  Document Released: 11/01/2002 Document Revised: 01/22/2011 Document Reviewed: 02/19/2010 [facility] Patient Information 2012 [place].Pap Test A Pap test is a sampling of cells from a woman's cervix. The cervix is the opening between the vagina (birth canal) and the uterus (the bottom part of the womb). The cells are scraped from the cervix during a pelvic exam. These cells are then looked at under a microscope to see if the cells are normal or to see if a cancer is developing or there are changes that suggest a cancer will develop. Cervical dysplasia is a condition in which a woman has abnormal changes in the top layer of cells of her cervix. These changes are an early sign that cervical cancer may develop. Pap tests also look for the human papilloma virus (HPV) because it has 4 types that are responsible for 70% of cervical cancer. Infections can also be  found during a Pap test such as bacteria, fungus, protozoa and viruses.  Cervical cancer is harder to treat and less likely to have a good outcome if left untreated. Catching the disease at an early stage leads to a better outcome. Since the Pap test was introduced 60 years ago, deaths from cervical cancer have decreased by 70%. Every woman should keep up to date with Pap tests. RISK FACTORS FOR CERVICAL CANCER INCLUDE:   Becoming sexually active before age 101.   Being the daughter of a woman who took diethylstilbestrol (DES) during pregnancy.   Having a sexual partner who has or has had cancer of the penis.   Having a sexual partner whose past partner had cervical cancer or cervical dysplasia (early cell changes which suggest a cancer may develop).   Having a weakened immune system. An example would be HIV or other immunodeficiency disorder.   Having had a sexually transmitted infection such as chlamydia, gonorrhea or HPV.   Having had an abnormal Pap or cancer of the vagina or vulva.   Having had more than one sexual partner.   A history of cervical cancer in a woman's sister or mother.   Not using condoms with new sexual partners.   Smoking.  WHO SHOULD HAVE PAP TESTS  A Pap test is done to screen for cervical cancer.   The first Pap test should be done at age 38.   Between ages 28 and 67, Pap tests are repeated every 2 years.   Beginning at age 18, you are advised to have a Pap test every 3 years as long as your past 3 Pap tests have been normal.   Some women have medical problems that increase the chance of getting cervical cancer. Talk to your caregiver about these problems. It is especially important to talk to your caregiver if a new problem develops soon after your last Pap test. In these cases, your caregiver may recommend more frequent screening and Pap tests.   The above recommendations are the same for women who have or have not gotten the vaccine for HPV (Human  Papillomavirus).   If you had a hysterectomy for a problem that was not a cancer or a condition that could lead to cancer, then you no longer need Pap tests. However, even if you no  longer need a Pap test, a regular exam is a good idea to make sure no other problems are starting.    If you are between ages 33 and 17, and you have had normal Pap tests going back 10 years, you no longer need Pap tests. However, even if you no longer need a Pap test, a regular exam is a good idea to make sure no other problems are starting.    If you have had past treatment for cervical cancer or a condition that could lead to cancer, you need Pap tests and screening for cancer for at least 20 years after your treatment.   If Pap tests have been discontinued, risk factors (such as a new sexual partner) need to be re-assessed to determine if screening should be resumed.   Some women may need screenings more often if they are at high risk for cervical cancer.  PREPARATION FOR A PAP TEST A Pap test should be performed during the weeks before the start of menstruation. Women should not douche or have sexual intercourse for 24 hours before the test. No vaginal creams, diaphragms, or tampons should be used for 24 hours before the test. To minimize discomfort, a woman should empty her bladder just before the exam. TAKING THE PAP TEST The caregiver will perform a pelvic exam. A metal or plastic instrument (speculum) is placed in the vagina. This is done before your caregiver does a bimanual exam of your internal female organs. This instrument allows your caregiver to see the inside of the vagina and look at the cervix. A small, sterile brush is used to take a sample of cells from the internal opening of the cervix. A small wooden spatula is used to scrape the outside of the cervix. Neither of these two methods to collect cells will cause you pain. These two scrapings are placed on a glass slide or in a small bottle filled  with a special liquid. The cells are looked at later under a microscope in a lab. A specialist will look at these cells and determine if the cells are normal. RESULTS OF YOUR PAP TEST  A healthy Pap test shows no abnormal cells or evidence of inflammation.   The presence of abnormally growing cells on the surface of the cervix may be reported as an abnormal Pap test. Different categories of findings are used to describe your Pap test. Your caregiver will go over the importance of these findings with you. The caregiver will then determine what follow-up is needed or when you should have your next pap test.   If you have had two or more abnormal Pap tests:   You may be asked to have a colposcopy. This is a test in which the cervix is viewed with a special lighted microscope.   A cervical tissue sample (biopsy) may also be needed. This involves taking a small tissue sample from the cervix. The sample is looked at under a microscope to find the cause of the abnormal cells. Make sure you find out the results of the Pap test. If you have not received the results within two weeks, contact your caregiver's office for the results. Do not assume everything is normal if you have not heard from your caregiver or medical facility. It is important to follow up on all of your test results.  Document Released: 04/25/2002 Document Revised: 01/22/2011 Document Reviewed: 01/27/2011 [facility] Patient Information 2012 [place].

## 2011-07-08 ENCOUNTER — Other Ambulatory Visit: Payer: Self-pay | Admitting: *Deleted

## 2011-07-08 ENCOUNTER — Encounter: Payer: Self-pay | Admitting: *Deleted

## 2011-07-08 DIAGNOSIS — B20 Human immunodeficiency virus [HIV] disease: Secondary | ICD-10-CM

## 2011-07-08 MED ORDER — ABACAVIR SULFATE-LAMIVUDINE 600-300 MG PO TABS: 1.0000 | ORAL_TABLET | Freq: Every day | ORAL | Status: DC

## 2011-07-08 MED ORDER — EFAVIRENZ 600 MG PO TABS: 600.0000 mg | ORAL_TABLET | Freq: Every day | ORAL | Status: DC

## 2011-07-08 NOTE — Telephone Encounter (Signed)
error 

## 2011-07-23 ENCOUNTER — Encounter: Payer: Self-pay | Admitting: *Deleted

## 2011-07-23 NOTE — Progress Notes (Signed)
Patient ID: Sharon Norris, female   DOB: 1955/06/24, 56 y.o.   MRN: [id number]  Pt had appt with [facility] [phone]) on 07/14/11 but recently rescheduled for September 01, 2011. Will follow up. Eliezer Champagne RN

## 2011-08-14 ENCOUNTER — Other Ambulatory Visit: Payer: Self-pay | Admitting: Infectious Diseases

## 2011-08-14 ENCOUNTER — Other Ambulatory Visit: Payer: Self-pay | Admitting: *Deleted

## 2011-08-14 DIAGNOSIS — Z79899 Other long term (current) drug therapy: Secondary | ICD-10-CM

## 2011-08-14 DIAGNOSIS — B20 Human immunodeficiency virus [HIV] disease: Secondary | ICD-10-CM

## 2011-08-14 DIAGNOSIS — Z113 Encounter for screening for infections with a predominantly sexual mode of transmission: Secondary | ICD-10-CM

## 2011-08-14 MED ORDER — ABACAVIR SULFATE-LAMIVUDINE 600-300 MG PO TABS: 1.0000 | ORAL_TABLET | Freq: Every day | ORAL | Status: DC

## 2011-08-14 MED ORDER — EFAVIRENZ 600 MG PO TABS: 600.0000 mg | ORAL_TABLET | Freq: Every day | ORAL | Status: DC

## 2011-08-24 ENCOUNTER — Other Ambulatory Visit: Payer: Self-pay

## 2011-08-24 DIAGNOSIS — B20 Human immunodeficiency virus [HIV] disease: Secondary | ICD-10-CM

## 2011-08-24 LAB — COMPREHENSIVE METABOLIC PANEL
AST: 160 U/L — ABNORMAL HIGH (ref 0–37)
Albumin: 3.9 g/dL (ref 3.5–5.2)
BUN: 22 mg/dL (ref 6–23)
Calcium: 9.4 mg/dL (ref 8.4–10.5)
Chloride: 111 mEq/L (ref 96–112)
Glucose, Bld: 89 mg/dL (ref 70–99)
Potassium: 4.2 mEq/L (ref 3.5–5.3)

## 2011-08-24 LAB — CBC
HCT: 38.4 % (ref 36.0–46.0)
Hemoglobin: 13 g/dL (ref 12.0–15.0)
RBC: 3.85 MIL/uL — ABNORMAL LOW (ref 3.87–5.11)
WBC: 5.9 10*3/uL (ref 4.0–10.5)

## 2011-08-25 LAB — HIV-1 RNA QUANT-NO REFLEX-BLD: HIV-1 RNA Quant, Log: <1.3 {Log} (ref <1.30)

## 2011-08-25 LAB — T-HELPER CELL (CD4) - (RCID CLINIC ONLY): CD4 % Helper T Cell: 17 % — ABNORMAL LOW (ref 33–55)

## 2011-09-09 ENCOUNTER — Encounter (HOSPITAL_COMMUNITY): Payer: Self-pay | Admitting: *Deleted

## 2011-09-09 ENCOUNTER — Emergency Department (HOSPITAL_COMMUNITY)
Admission: EM | Admit: 2011-09-09 | Discharge: 2011-09-09 | Disposition: A | Discharge status: Home / Self Care | Payer: Self-pay | Attending: Emergency Medicine | Admitting: Emergency Medicine

## 2011-09-09 ENCOUNTER — Emergency Department (HOSPITAL_COMMUNITY): Payer: Self-pay

## 2011-09-09 DIAGNOSIS — M25552 Pain in left hip: Secondary | ICD-10-CM

## 2011-09-09 DIAGNOSIS — S8992XA Unspecified injury of left lower leg, initial encounter: Secondary | ICD-10-CM

## 2011-09-09 DIAGNOSIS — M25559 Pain in unspecified hip: Secondary | ICD-10-CM | POA: Insufficient documentation

## 2011-09-09 DIAGNOSIS — S93409A Sprain of unspecified ligament of unspecified ankle, initial encounter: Secondary | ICD-10-CM | POA: Insufficient documentation

## 2011-09-09 DIAGNOSIS — W108XXA Fall (on) (from) other stairs and steps, initial encounter: Secondary | ICD-10-CM | POA: Insufficient documentation

## 2011-09-09 DIAGNOSIS — Z21 Asymptomatic human immunodeficiency virus [HIV] infection status: Secondary | ICD-10-CM | POA: Insufficient documentation

## 2011-09-09 DIAGNOSIS — S39012A Strain of muscle, fascia and tendon of lower back, initial encounter: Secondary | ICD-10-CM

## 2011-09-09 DIAGNOSIS — I10 Essential (primary) hypertension: Secondary | ICD-10-CM | POA: Insufficient documentation

## 2011-09-09 DIAGNOSIS — IMO0002 Reserved for concepts with insufficient information to code with codable children: Secondary | ICD-10-CM | POA: Insufficient documentation

## 2011-09-09 DIAGNOSIS — S8990XA Unspecified injury of unspecified lower leg, initial encounter: Secondary | ICD-10-CM | POA: Insufficient documentation

## 2011-09-09 MED ORDER — HYDROCODONE-ACETAMINOPHEN 5-325 MG PO TABS: 2.0000 | ORAL_TABLET | ORAL | Status: AC | PRN

## 2011-09-09 MED ORDER — HYDROMORPHONE HCL PF 1 MG/ML IJ SOLN
1.0000 mg | Freq: Once | INTRAMUSCULAR | Status: AC
  Administered 2011-09-09: 1 mg via INTRAMUSCULAR
  Filled 2011-09-09: qty 1

## 2011-09-09 NOTE — ED Notes (Signed)
Pt reports left ankle "numb and painful". Hx of neuropathy. Pedal pulses present

## 2011-09-09 NOTE — Progress Notes (Signed)
CM spoke with pt who confirms self pay [facility] resident with no have a pcp county. Discussed the importance of a pcp for f/u. Reviewed Health connect number to assist with finding self pay provider close to pt's residence. Reviewed resources for [employer], general medial clinics, medications-needymeds.com, housing, DSS, health Department and other resources in [employer] including crisis programs Pt voiced understanding and appreciation of resources provided [place] liaison spoke with her Pt offered [facility] services to assist with finding a [place] self pay provider Pt has an active orange card from [facility] infectious disease program

## 2011-09-09 NOTE — ED Provider Notes (Signed)
History     CSN: [id number]  Arrival date & time 09/09/11  1414   First MD Initiated Contact with Patient 09/09/11 1427      Chief Complaint  Patient presents with  . Fall  . Knee Injury    (Consider location/radiation/quality/duration/timing/severity/associated sxs/prior treatment) HPI Comments: Sharon Norris 56 y.o. female   The chief complaint is: Patient presents with:   Fall   Knee Injury   The patient has medical history significant for:   Past Medical History:   HIV infection                                                Hypertension                                                 Neuropathy                                                   Anxiety                                                      Depression                                                   Sleep difficulties                                           H/O drug abuse                                                 Comment:IV drugs, 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     Sharon Norris is a 56 y.o. Female who presents to the emergency room after a fall. She was at her granddaughter's house walking down the stairs in sock feet when she slipped and fell. She fell down approximately 7 stairs and landed on her left knee.  She states she did not hit her head and did not lose consciousness. She denies pain in her neck or upper back and in her upper extremities. She denies pain in her right hip and lower extremity.      Patient is a 56 y.o. female presenting with fall. The  history is provided by the patient and medical records.  Fall The accident occurred 1 to 2 hours ago. The fall occurred while walking  (walking down the stairs). Distance fallen: down 7 stairs. She landed on a hard floor. The point of impact was the left knee. The pain is present in the left hip and left knee. The pain is at a severity of 10/10. The pain is moderate. She was not ambulatory at the scene. There was no entrapment after the fall. Pertinent negatives include no fever, no abdominal pain and no headaches.    Past Medical History  Diagnosis Date  . HIV infection   . Hypertension   . Neuropathy   . Anxiety   . Depression   . Sleep difficulties   . H/O drug abuse     IV drugs, drug free 12 years    Past Surgical History  Procedure Date  . Colposcopy 2010    Family History  Problem Relation Age of Onset  . Alcohol abuse Father   . Cancer Maternal Aunt     endometrial  . Cancer Sister     endometrial  . Cancer Sister     breast    History  Substance Use Topics  . Smoking status: Former Smoker    Quit date: 02/17/2000  . Smokeless tobacco: Never Used  . Alcohol Use: 0.5 oz/week    1 drink(s) per week     beer    OB History    Grav Para Term Preterm Abortions TAB SAB Ect Mult Living   3 2 2  1 1           Review of Systems  Constitutional: Negative for fever and fatigue.  HENT: Negative for facial swelling, neck pain, neck stiffness and tinnitus.   Respiratory: Negative for shortness of breath.   Cardiovascular: Negative for chest pain.  Gastrointestinal: Negative for abdominal pain.  Musculoskeletal: Positive for back pain and joint swelling.  Skin: Negative for color change, rash and wound.  Neurological: Negative for syncope, light-headedness and headaches.    Allergies  Review of patient's allergies indicates no known allergies.  Home Medications   Current Outpatient Rx  Name Route Sig Dispense Refill  . ABACAVIR SULFATE-LAMIVUDINE 600-300 MG PO TABS Oral Take 1 tablet by mouth at bedtime.    Marland Kitchen CLONIDINE HCL 0.1 MG PO TABS Oral Take 1 tablet (0.1 mg total) by mouth 2 (two)  times daily. 60 tablet 6  . DIVALPROEX SODIUM ER 500 MG PO TB24 Oral Take 1 tablet (500 mg total) by mouth daily. In am. Per mental heath 100 tablet 3    rec'd 1 bottle of 100 lot [id number] & exp 11/01/12  . DIVALPROEX SODIUM 250 MG PO TBEC Oral Take 1 tablet (250 mg total) by mouth at bedtime. 100 tablet 3    rec'd 1 bottle of 100 lot [id number] & exp 10/30/13  . EFAVIRENZ 600 MG PO TABS Oral Take 1 tablet (600 mg total) by mouth at bedtime. 30 tablet 6    Pt needs to keep upcoming appointments.  Marland Kitchen GABAPENTIN 100 MG PO CAPS Oral Take 1 capsule (100 mg total) by mouth at bedtime. 100 capsule 3    rec'd a bottle of 100 04/15/11. Lot [id number] & exp 1 ...  . GABAPENTIN 300 MG PO CAPS Oral Take 1 capsule (300 mg total) by mouth daily. From mental health 100 capsule 3    04/15/11 rec'd a bottle of #100 300mg . Exp 09/2013.  ...  Marland Kitchen  HYDROCHLOROTHIAZIDE 25 MG PO TABS Oral Take 25 mg by mouth daily.      Marland Kitchen LISINOPRIL 30 MG PO TABS Oral Take 1 tablet (30 mg total) by mouth daily. 30 tablet 6  . TRAZODONE HCL 100 MG PO TABS Oral Take 1 tablet (100 mg total) by mouth at bedtime. Originally rx's by mental health 90 tablet 3  . ZOLPIDEM TARTRATE 5 MG PO TABS  TAKE ONE TABLET BY MOUTH AT BEDTIME AS NEEDED FOR  INSOMNIA 30 tablet 1    BP 193/73  Pulse 72  Temp 98.3 F (36.8 C) (Oral)  Resp 18  SpO2 99%  Physical Exam  Nursing note and vitals reviewed. Constitutional: She appears well-developed and well-nourished. No distress.  HENT:  Head: Normocephalic and atraumatic.  Mouth/Throat: Oropharynx is clear and moist. No oropharyngeal exudate.  Eyes: Conjunctivae are normal. No scleral icterus.  Neck: Normal range of motion. Neck supple.       Full ROM Neg pain with ROM Neg pain with palpation  Cardiovascular: Normal rate, regular rhythm, normal heart sounds and intact distal pulses.  Exam reveals no decreased pulses.   Pulses:      Popliteal pulses are 2+ on the right side, and 2+ on the left side.        Dorsalis pedis pulses are 2+ on the right side, and 2+ on the left side.       Posterior tibial pulses are 2+ on the right side, and 2+ on the left side.       Capillary refill <2 sec  Pulmonary/Chest: Effort normal and breath sounds normal. No respiratory distress. She has no wheezes.  Abdominal: Soft. Bowel sounds are normal. She exhibits no mass. There is no tenderness. There is no rebound and no guarding.  Musculoskeletal: Normal range of motion. She exhibits no edema.       Limited ROM on LLE 2/2 pain Swelling of L ankle and L knee, mild No deformity or ecchymosis of LLE Unwilling to move L hip for evaluation 2/2 pain in leg  Pain to palpation of paraspinal muscles on L in L-spine region Full ROM in upper extremities bilaterally without pain  Neurological: She is alert. No cranial nerve deficit. She exhibits normal muscle tone. Coordination normal.       Speech is clear and goal oriented Moves extremities without ataxia Normal strength and sensation at baseline bilaterally in upper extremities Normal strength and sensation at baseline in RLE Strength testing in L limited by pain, sensation at baseline in LLE  Skin: Skin is warm and dry. She is not diaphoretic.       No open wounds noted, no ecchymosis   Psychiatric: She has a normal mood and affect.    ED Course  Procedures (including critical care time)  Labs Reviewed - No data to display  I have personally reviewed the images and reports.   Dg Lumbar Spine Complete  09/09/2011  *RADIOLOGY REPORT*  Clinical Data: Fall  LUMBAR SPINE - COMPLETE 4+ VIEW  Comparison: None Correlation:  MRI lumbar spine 03/18/2011  Findings: Osseous demineralization. Five non-rib bearing lumbar vertebrae. Facet degenerative changes L4-L5 and L5-S1. Vertebral body heights maintained without fracture or subluxation. No gross evidence of bone destruction or spondylolysis. SI joints symmetric.  IMPRESSION: Osseous demineralization. Facet degenerative  changes lower lumbar spine. No acute abnormalities.  Original Report Authenticated By: Burnetta Sabin, M.D.   Dg Hip Complete Left  09/09/2011  *RADIOLOGY REPORT*  Clinical Data: Fall down steps  landing on left side, hip pain  LEFT HIP - COMPLETE 2+ VIEW  Comparison: None.  Findings: Osseous demineralization. Symmetric hip and SI joints. Few pelvic phleboliths. No acute fracture, dislocation or bone destruction.  IMPRESSION: No acute osseous abnormalities.  Original Report Authenticated By: Burnetta Sabin, M.D.   Dg Ankle Complete Left  09/09/2011  *RADIOLOGY REPORT*  Clinical Data: Fall  LEFT ANKLE COMPLETE - 3+ VIEW  Comparison: None.  Findings: Osseous demineralization. Minimal soft tissue swelling. Ankle mortise intact. No acute fracture, dislocation, or bone destruction. Small plantar calcaneal spur.  IMPRESSION: Plantar calcaneal spur. Osseous demineralization. No acute abnormalities.  Original Report Authenticated By: Burnetta Sabin, M.D.   Dg Knee Complete 4 Views Left  09/09/2011  *RADIOLOGY REPORT*  Clinical Data: Fall, knee injury  LEFT KNEE - COMPLETE 4+ VIEW  Comparison: None  Findings: Osseous demineralization. Joint spaces preserved. No acute fracture, dislocation, or bone destruction. Question minimal knee joint effusion.  IMPRESSION: Question minimal knee joint effusion. Osseous demineralization without acute bony abnormality.  Original Report Authenticated By: Burnetta Sabin, M.D.   Re-evaluation after x-rays and pain management:  Pt has better ROM in L knee, ankle and hip.  Though the exam continues to be some limited by pain, she has good strength in the L ankle, knee and hip.  Her patella tendon is intact and she has good quad strength.   1. Ankle sprain   2. Left knee injury   3. Back strain   4. Left hip pain       MDM  Jari Pigg presented with pain down the LLE after a fall this afternoon.  She has a Hx of neuropathy, but her strength and sensation are intact to  baseline.  Her x-rays are negative for any acute bony injury.  On re-exam she has better ROM and movement.  I will support her knee and ankle with an ASO and knee immobilizer and get her a set of crutches.  She is to f/u with an orthopedic is pain persists.  I have discusses  1. Medications: usual home medications, norco PRN pain 2. Treatment: knee immobilizer, ankle brace and crutches.  RICE for knee and ankle.   3. Follow Up: with PCP or orthopedics if pain persists         Abigail Butts, PA-C 09/09/11 1728

## 2011-09-09 NOTE — ED Notes (Signed)
Per EMS: Pt at granddaughter's house, fell down 7 steps. Reports pain on left knee and left ankle. Swelling noted on knee and ankle. Denies loc

## 2011-09-09 NOTE — ED Notes (Signed)
[id number] Expected date:09/09/11<BR> Expected time: 1:57 PM<BR> Means of arrival:Ambulance<BR> Comments:<BR> EMS

## 2011-09-11 ENCOUNTER — Ambulatory Visit: Payer: Self-pay | Admitting: Infectious Diseases

## 2011-09-11 ENCOUNTER — Ambulatory Visit: Payer: Self-pay

## 2011-09-12 NOTE — ED Provider Notes (Signed)
Medical screening examination/treatment/procedure(s) were performed by non-physician practitioner and as supervising physician I was immediately available for consultation/collaboration.  Varney Biles, MD 09/12/11 1446

## 2011-09-30 ENCOUNTER — Ambulatory Visit (INDEPENDENT_AMBULATORY_CARE_PROVIDER_SITE_OTHER): Payer: Self-pay | Admitting: Infectious Diseases

## 2011-09-30 ENCOUNTER — Ambulatory Visit: Payer: Self-pay

## 2011-09-30 ENCOUNTER — Encounter: Payer: Self-pay | Admitting: Infectious Diseases

## 2011-09-30 VITALS — BP 138/87 | HR 78 | Temp 97.8°F | Ht 63.0 in | Wt 187.0 lb

## 2011-09-30 DIAGNOSIS — Z113 Encounter for screening for infections with a predominantly sexual mode of transmission: Secondary | ICD-10-CM

## 2011-09-30 DIAGNOSIS — G4700 Insomnia, unspecified: Secondary | ICD-10-CM

## 2011-09-30 DIAGNOSIS — Z79899 Other long term (current) drug therapy: Secondary | ICD-10-CM

## 2011-09-30 DIAGNOSIS — K029 Dental caries, unspecified: Secondary | ICD-10-CM

## 2011-09-30 DIAGNOSIS — B20 Human immunodeficiency virus [HIV] disease: Secondary | ICD-10-CM

## 2011-09-30 DIAGNOSIS — N289 Disorder of kidney and ureter, unspecified: Secondary | ICD-10-CM

## 2011-09-30 MED ORDER — ZOLPIDEM TARTRATE 10 MG PO TABS: 10.0000 mg | ORAL_TABLET | Freq: Every evening | ORAL | Status: DC | PRN

## 2011-09-30 NOTE — Assessment & Plan Note (Signed)
Has worsened slightly since last visit (0.1). She has nephrology appt September 2013 at [facility].

## 2011-09-30 NOTE — Assessment & Plan Note (Signed)
Will try to get her back into dental clinic.

## 2011-09-30 NOTE — Assessment & Plan Note (Signed)
She is doing very well. Not sexually active but given condoms (for my grandkids.Marland Kitchen). Will continue her current rx until [facility] is available. vax up to date. Repeat Mammo/pap next year.  Will see her back in 6 months.

## 2011-09-30 NOTE — Assessment & Plan Note (Signed)
Will increase her ambien to 10mg  qhs prn

## 2011-09-30 NOTE — Progress Notes (Signed)
  Subjective:    Patient ID: Sharon Norris, female    DOB: 1955-09-21, 56 y.o.   MRN: [id number]  HPI 56 yo F with hx of HIV+ since 1994 and Hep C (1a with prev VL 1,060,000). Was previously using IVDA and was previously incarcerated (d/c 12-09-06).  Currently taking EFV/EPZ. No problems with this.  Had PAP abn previously- had [facility] 01-17-09. had f/u June 2011: ASCUS. Had repeat at GYN 05-2011- negative. Also had normal Mammo.    Since last visit has been seeing mental health weekly, started on depakote, trazadone.  Currently, has been taking 2x ambien and running out, trazadone not working.   Has hx of back pain- repeat MRI- Negative for central canal or foraminal stenosis with only mild, scattered disc bulging as described above.  Facet degenerative disease L4-5 and L5-S1 without marked interval change. Fell down the stairs 1 week ago, has been feeling better slowly.   Taking neurontin bid but still having numbness in her R foot and somewhat in her hand.   Her teeth are cracking and falling out. Has not gotten into dentist- had appt but missed as her grandchild broke their leg.   HIV 1 RNA Quant (copies/mL)  Date Value  08/24/2011 <20   02/25/2011 37*  08/14/2010 <20      CD4 T Cell Abs (cmm)  Date Value  08/24/2011 260*  02/25/2011 260*  08/14/2010 220*      Review of Systems  Constitutional: Negative for appetite change and unexpected weight change.  HENT: Positive for dental problem.   Gastrointestinal: Negative for diarrhea and constipation.  Genitourinary: Negative for dysuria.       Objective:   Physical Exam  Constitutional: She appears well-developed and well-nourished.  HENT:  Mouth/Throat: Abnormal dentition. Dental caries present. No oropharyngeal exudate.  Eyes: EOM are normal. Pupils are equal, round, and reactive to light.  Neck: Neck supple.  Cardiovascular: Normal rate, regular rhythm and normal heart sounds.   Pulmonary/Chest: Effort normal and breath  sounds normal.  Abdominal: Soft. Bowel sounds are normal. She exhibits no distension. There is no tenderness.  Lymphadenopathy:    She has no cervical adenopathy.          Assessment & Plan:

## 2012-02-04 ENCOUNTER — Telehealth: Payer: Self-pay | Admitting: Licensed Clinical Social Worker

## 2012-02-04 NOTE — Telephone Encounter (Signed)
Patient wants a copy of her medication list faxed to [facility] to attention Huey Bienenstock. Fax number is [phone]. I called and left Huey Bienenstock a voicemail to fax [place] a copy of release that the patient signed then I can fax her medication list.

## 2012-03-02 ENCOUNTER — Other Ambulatory Visit: Payer: Self-pay | Admitting: Infectious Diseases

## 2012-03-21 ENCOUNTER — Other Ambulatory Visit: Payer: Self-pay

## 2012-04-04 ENCOUNTER — Telehealth: Payer: Self-pay | Admitting: *Deleted

## 2012-04-04 ENCOUNTER — Ambulatory Visit: Payer: Self-pay | Admitting: Infectious Diseases

## 2012-04-04 NOTE — Telephone Encounter (Signed)
Called patient and rescheduled for lab appointment. Rescheduled her lab appointment and advised we will schedule with Dr. Johnnye Sima when she comes for her labs. Sharon Norris

## 2012-04-11 ENCOUNTER — Other Ambulatory Visit: Payer: Self-pay | Admitting: *Deleted

## 2012-04-11 ENCOUNTER — Other Ambulatory Visit: Payer: Self-pay

## 2012-04-11 DIAGNOSIS — F329 Major depressive disorder, single episode, unspecified: Secondary | ICD-10-CM

## 2012-04-11 DIAGNOSIS — I10 Essential (primary) hypertension: Secondary | ICD-10-CM

## 2012-04-11 DIAGNOSIS — G629 Polyneuropathy, unspecified: Secondary | ICD-10-CM

## 2012-04-11 MED ORDER — LISINOPRIL 30 MG PO TABS: ORAL_TABLET | ORAL | Status: DC

## 2012-04-11 MED ORDER — DIVALPROEX SODIUM 250 MG PO DR TAB: 250.0000 mg | DELAYED_RELEASE_TABLET | Freq: Every day | ORAL | Status: DC

## 2012-04-11 MED ORDER — CLONIDINE HCL 0.1 MG PO TABS: ORAL_TABLET | ORAL | Status: DC

## 2012-04-11 MED ORDER — TRAZODONE HCL 100 MG PO TABS: 100.0000 mg | ORAL_TABLET | Freq: Every day | ORAL | Status: DC

## 2012-04-11 MED ORDER — DIVALPROEX SODIUM ER 500 MG PO TB24: 500.0000 mg | ORAL_TABLET | Freq: Every day | ORAL | Status: DC

## 2012-04-11 MED ORDER — GABAPENTIN 300 MG PO CAPS: 300.0000 mg | ORAL_CAPSULE | Freq: Every day | ORAL | Status: DC

## 2012-04-11 MED ORDER — GABAPENTIN 100 MG PO CAPS: 100.0000 mg | ORAL_CAPSULE | Freq: Every day | ORAL | Status: DC

## 2012-04-12 ENCOUNTER — Other Ambulatory Visit (INDEPENDENT_AMBULATORY_CARE_PROVIDER_SITE_OTHER): Payer: Self-pay

## 2012-04-12 ENCOUNTER — Other Ambulatory Visit: Payer: Self-pay | Admitting: Infectious Diseases

## 2012-04-12 DIAGNOSIS — B20 Human immunodeficiency virus [HIV] disease: Secondary | ICD-10-CM

## 2012-04-12 DIAGNOSIS — Z113 Encounter for screening for infections with a predominantly sexual mode of transmission: Secondary | ICD-10-CM

## 2012-04-12 DIAGNOSIS — Z79899 Other long term (current) drug therapy: Secondary | ICD-10-CM

## 2012-04-13 ENCOUNTER — Other Ambulatory Visit: Payer: Self-pay | Admitting: *Deleted

## 2012-04-13 ENCOUNTER — Telehealth: Payer: Self-pay | Admitting: *Deleted

## 2012-04-13 DIAGNOSIS — G629 Polyneuropathy, unspecified: Secondary | ICD-10-CM

## 2012-04-13 LAB — LIPID PANEL
HDL: 58 mg/dL (ref >39)
LDL Cholesterol: 57 mg/dL (ref 0–99)
Total CHOL/HDL Ratio: 3.1 Ratio
Triglycerides: 331 mg/dL — ABNORMAL HIGH (ref <150)

## 2012-04-13 LAB — CBC
HCT: 34.7 % — ABNORMAL LOW (ref 36.0–46.0)
MCH: 33.9 pg (ref 26.0–34.0)
MCV: 96.4 fL (ref 78.0–100.0)
RBC: 3.6 MIL/uL — ABNORMAL LOW (ref 3.87–5.11)
WBC: 6.1 10*3/uL (ref 4.0–10.5)

## 2012-04-13 LAB — COMPREHENSIVE METABOLIC PANEL
Alkaline Phosphatase: 152 U/L — ABNORMAL HIGH (ref 39–117)
CO2: 20 mEq/L (ref 19–32)
Creat: 1.24 mg/dL — ABNORMAL HIGH (ref 0.50–1.10)
Glucose, Bld: 151 mg/dL — ABNORMAL HIGH (ref 70–99)
Total Bilirubin: 0.5 mg/dL (ref 0.3–1.2)

## 2012-04-13 LAB — RPR

## 2012-04-13 MED ORDER — GABAPENTIN 300 MG PO CAPS: 300.0000 mg | ORAL_CAPSULE | Freq: Every day | ORAL | Status: DC

## 2012-04-13 NOTE — Telephone Encounter (Signed)
Pharmacy request for Depakote denied as per Dr. Algis Downs note from 09/2011 it was noted that she was having this filled by mental health. Called patient and requested she return my call. [name]

## 2012-04-14 ENCOUNTER — Other Ambulatory Visit: Payer: Self-pay | Admitting: Infectious Diseases

## 2012-04-14 LAB — T-HELPER CELL (CD4) - (RCID CLINIC ONLY)
CD4 % Helper T Cell: 21 % — ABNORMAL LOW (ref 33–55)
CD4 T Cell Abs: 310 uL — ABNORMAL LOW (ref 400–2700)

## 2012-04-27 ENCOUNTER — Ambulatory Visit: Payer: Self-pay

## 2012-04-27 ENCOUNTER — Ambulatory Visit: Payer: Self-pay | Admitting: Infectious Diseases

## 2012-05-09 ENCOUNTER — Other Ambulatory Visit: Payer: Self-pay

## 2012-05-09 ENCOUNTER — Encounter (HOSPITAL_COMMUNITY): Payer: Self-pay | Admitting: *Deleted

## 2012-05-09 ENCOUNTER — Emergency Department (HOSPITAL_COMMUNITY)
Admission: EM | Admit: 2012-05-09 | Discharge: 2012-05-09 | Disposition: A | Discharge status: Home / Self Care | Payer: Self-pay | Attending: Emergency Medicine | Admitting: Emergency Medicine

## 2012-05-09 ENCOUNTER — Ambulatory Visit (INDEPENDENT_AMBULATORY_CARE_PROVIDER_SITE_OTHER): Payer: Self-pay | Admitting: Infectious Diseases

## 2012-05-09 ENCOUNTER — Ambulatory Visit: Payer: Self-pay

## 2012-05-09 ENCOUNTER — Encounter: Payer: Self-pay | Admitting: Infectious Diseases

## 2012-05-09 ENCOUNTER — Emergency Department (HOSPITAL_COMMUNITY): Payer: Self-pay

## 2012-05-09 VITALS — BP 182/108 | HR 74 | Temp 97.2°F | Ht 63.0 in | Wt 181.0 lb

## 2012-05-09 DIAGNOSIS — I10 Essential (primary) hypertension: Secondary | ICD-10-CM | POA: Insufficient documentation

## 2012-05-09 DIAGNOSIS — S20222A Contusion of left back wall of thorax, initial encounter: Secondary | ICD-10-CM

## 2012-05-09 DIAGNOSIS — Y92009 Unspecified place in unspecified non-institutional (private) residence as the place of occurrence of the external cause: Secondary | ICD-10-CM | POA: Insufficient documentation

## 2012-05-09 DIAGNOSIS — K029 Dental caries, unspecified: Secondary | ICD-10-CM

## 2012-05-09 DIAGNOSIS — W19XXXA Unspecified fall, initial encounter: Secondary | ICD-10-CM | POA: Insufficient documentation

## 2012-05-09 DIAGNOSIS — Y9301 Activity, walking, marching and hiking: Secondary | ICD-10-CM | POA: Insufficient documentation

## 2012-05-09 DIAGNOSIS — F411 Generalized anxiety disorder: Secondary | ICD-10-CM | POA: Insufficient documentation

## 2012-05-09 DIAGNOSIS — Z79899 Other long term (current) drug therapy: Secondary | ICD-10-CM | POA: Insufficient documentation

## 2012-05-09 DIAGNOSIS — F3289 Other specified depressive episodes: Secondary | ICD-10-CM | POA: Insufficient documentation

## 2012-05-09 DIAGNOSIS — F329 Major depressive disorder, single episode, unspecified: Secondary | ICD-10-CM | POA: Insufficient documentation

## 2012-05-09 DIAGNOSIS — S20229A Contusion of unspecified back wall of thorax, initial encounter: Secondary | ICD-10-CM | POA: Insufficient documentation

## 2012-05-09 DIAGNOSIS — B20 Human immunodeficiency virus [HIV] disease: Secondary | ICD-10-CM

## 2012-05-09 DIAGNOSIS — Z87891 Personal history of nicotine dependence: Secondary | ICD-10-CM | POA: Insufficient documentation

## 2012-05-09 DIAGNOSIS — F1910 Other psychoactive substance abuse, uncomplicated: Secondary | ICD-10-CM | POA: Insufficient documentation

## 2012-05-09 DIAGNOSIS — R079 Chest pain, unspecified: Secondary | ICD-10-CM

## 2012-05-09 DIAGNOSIS — Z8669 Personal history of other diseases of the nervous system and sense organs: Secondary | ICD-10-CM | POA: Insufficient documentation

## 2012-05-09 DIAGNOSIS — R0789 Other chest pain: Secondary | ICD-10-CM | POA: Insufficient documentation

## 2012-05-09 LAB — COMPREHENSIVE METABOLIC PANEL
CO2: 15 mEq/L — ABNORMAL LOW (ref 19–32)
Calcium: 9.4 mg/dL (ref 8.4–10.5)
Creatinine, Ser: 1.06 mg/dL (ref 0.50–1.10)
GFR calc Af Amer: 66 mL/min — ABNORMAL LOW (ref >90)
GFR calc non Af Amer: 57 mL/min — ABNORMAL LOW (ref >90)
Glucose, Bld: 99 mg/dL (ref 70–99)
Sodium: 137 mEq/L (ref 135–145)
Total Protein: 7.9 g/dL (ref 6.0–8.3)

## 2012-05-09 LAB — CBC WITH DIFFERENTIAL/PLATELET
Eosinophils Absolute: 0.1 10*3/uL (ref 0.0–0.7)
Eosinophils Relative: 1 % (ref 0–5)
HCT: 36.6 % (ref 36.0–46.0)
Lymphocytes Relative: 18 % (ref 12–46)
Lymphs Abs: 1.5 10*3/uL (ref 0.7–4.0)
MCH: 35 pg — ABNORMAL HIGH (ref 26.0–34.0)
MCV: 100 fL (ref 78.0–100.0)
Monocytes Absolute: 0.4 10*3/uL (ref 0.1–1.0)
Platelets: 212 10*3/uL (ref 150–400)
RDW: 13.1 % (ref 11.5–15.5)
WBC: 8.4 10*3/uL (ref 4.0–10.5)

## 2012-05-09 LAB — TROPONIN I: Troponin I: <0.3 ng/mL (ref <0.30)

## 2012-05-09 MED ORDER — HYDROMORPHONE HCL PF 1 MG/ML IJ SOLN
1.0000 mg | Freq: Once | INTRAMUSCULAR | Status: AC
  Administered 2012-05-09: 1 mg via INTRAMUSCULAR
  Filled 2012-05-09: qty 1

## 2012-05-09 MED ORDER — OXYCODONE-ACETAMINOPHEN 5-325 MG PO TABS: 2.0000 | ORAL_TABLET | ORAL | Status: DC | PRN

## 2012-05-09 NOTE — Assessment & Plan Note (Signed)
Appears to be doing ok. Will continue to monitor.  See CP and HTN

## 2012-05-09 NOTE — Assessment & Plan Note (Signed)
Will have her seen in ED See CP

## 2012-05-09 NOTE — Assessment & Plan Note (Addendum)
Will have her eval in ED for admit, stabilization of BP and CP. Her ECG is NSR 72, no ST-T. Will get her into PCP

## 2012-05-09 NOTE — ED Notes (Signed)
Pt here complains of back pain since this am, chest pain and left arm pain x 1 week.  Pt advises she tripped and fell down her steps this am and hit her back.  Pt was seen at her PCP and sent here after advising them of having chest pain x 1 week.

## 2012-05-09 NOTE — ED Notes (Signed)
Pt resting quietly at this time.  St's pain has subsided.  No complaints voiced.

## 2012-05-09 NOTE — Assessment & Plan Note (Signed)
She's on list and waiting. Will f/u.

## 2012-05-09 NOTE — Progress Notes (Signed)
  Subjective:    Patient ID: Sharon Norris, female    DOB: 1955-08-17, 57 y.o.   MRN: [id number]  HPI 57 yo F with hx of HIV+ since 1994 and Hep C (1a with prev VL 1,060,000). Was previous IVDA and was previously incarcerated. Currently taking EFV/EPZ. No problems with this.   Had PAP abn previously- had [facility] 01-17-09. had f/u June 2011: ASCUS. Had repeat at GYN 06-11-2011- negative. Also had normal Mammo.   Since last visit has been seeing mental health weekly  C/o pains in her L arm. Lumps in her L arm also.  Fell down the stairs after falling on one of her grand-kids toys. Bumped her head and thinks she was knocked out for less than a second. Now back is sore and swollen.   BP stays high, all the time. Has BP cuff at home. Takes BP medicine, goes to sleep. When she wakes up, she still sees double. Has been having pain down her L arm, across her chest for the last month. Occas SOB. Gets diaphoresis. No nausea. Relieved with rest, relaxation. Episodes last 2-3 minutes.   HIV 1 RNA Quant (copies/mL)  Date Value  04/12/2012 31*  08/24/2011 <20   02/25/2011 37*     CD4 T Cell Abs (cmm)  Date Value  04/12/2012 310*  08/24/2011 260*  02/25/2011 260*      Review of Systems  Constitutional: Negative for appetite change and unexpected weight change.  Gastrointestinal: Positive for constipation. Negative for diarrhea.  Genitourinary: Negative for difficulty urinating.       Objective:   Physical Exam  Constitutional: She appears well-developed and well-nourished.  HENT:  Mouth/Throat: No oropharyngeal exudate.  Eyes: EOM are normal. Pupils are equal, round, and reactive to light.  Neck: Neck supple.  Cardiovascular: Normal rate, regular rhythm and normal heart sounds.   Pulses:      Radial pulses are 3+ on the right side, and 3+ on the left side.  Pulmonary/Chest: Effort normal and breath sounds normal.  Abdominal: Soft. Bowel sounds are normal. There is no tenderness.    Musculoskeletal:       Arms: Lymphadenopathy:    She has no cervical adenopathy.          Assessment & Plan:

## 2012-05-09 NOTE — ED Provider Notes (Signed)
  Medical screening examination/treatment/procedure(s) were performed by non-physician practitioner and as supervising physician I was immediately available for consultation/collaboration.  On my exam the patient was uncomfortable appearing, but in no distress.  The patient was neurologically intact.  Given her history of chronic chest pain, there is low suspicion for acute coronary syndrome.     Carmin Muskrat, MD 05/09/12 [phone]

## 2012-05-09 NOTE — ED Provider Notes (Signed)
History     CSN: [id number]  Arrival date & time 05/09/12  1112   None     Chief Complaint  Patient presents with  . Fall  . Chest Pain    (Consider location/radiation/quality/duration/timing/severity/associated sxs/prior treatment) Patient is a 57 y.o. female presenting with chest pain. The history is provided by the patient. No language interpreter was used.  Chest Pain Pain location:  L chest (back  ) Pain quality: aching   Pain radiates to:  Upper back and mid back Pain radiates to the back: no   Pain severity:  Severe Onset quality:  Gradual Timing:  Constant Progression:  Worsening Relieved by:  Nothing Worsened by:  Nothing tried Ineffective treatments:  None tried Associated symptoms: back pain   Risk factors: hypertension   Pt complains of pain in her chest for the past week.   Pt reports she fell this am while walking out of her house and hit her back.  Pt went to see her MD who told her to come here because of chest pain and fall  Past Medical History  Diagnosis Date  . HIV infection   . Hypertension   . Neuropathy   . Anxiety   . Depression   . Sleep difficulties   . H/O drug abuse     IV drugs, drug free 12 years    Past Surgical History  Procedure Laterality Date  . Colposcopy  2010    Family History  Problem Relation Age of Onset  . Alcohol abuse Father   . Cancer Maternal Aunt     endometrial  . Cancer Sister     endometrial  . Cancer Sister     breast    History  Substance Use Topics  . Smoking status: Former Smoker    Quit date: 02/17/2000  . Smokeless tobacco: Never Used  . Alcohol Use: 0.5 oz/week    1 drink(s) per week     Comment: beer    OB History   Grav Para Term Preterm Abortions TAB SAB Ect Mult Living   3 2 2  1 1           Review of Systems  Cardiovascular: Positive for chest pain.  Musculoskeletal: Positive for myalgias and back pain.  All other systems reviewed and are negative.    Allergies  Review of  patient's allergies indicates no known allergies.  Home Medications   Current Outpatient Rx  Name  Route  Sig  Dispense  Refill  . abacavir-lamiVUDine (EPZICOM) 600-300 MG per tablet   Oral   Take 1 tablet by mouth at bedtime.          . cloNIDine (CATAPRES) 0.1 MG tablet      TAKE 1 TABLET BY MOUTH TWICE DAILY   60 tablet   0     Last refill. Patient needs to make/keep appointmen ...   . divalproex (DEPAKOTE ER) 500 MG 24 hr tablet   Oral   Take 1 tablet (500 mg total) by mouth daily. In am. Per mental heath   30 tablet   0     Last refill. Patient needs to make/keep appointmen ...   . divalproex (DEPAKOTE) 250 MG DR tablet   Oral   Take 1 tablet (250 mg total) by mouth at bedtime.   30 tablet   0     Last refill. Patient needs to make/keep appointmen ...   . efavirenz (SUSTIVA) 600 MG tablet   Oral   Take  1 tablet (600 mg total) by mouth at bedtime.   30 tablet   6     Pt needs to keep upcoming appointments.   . gabapentin (NEURONTIN) 100 MG capsule   Oral   Take 1 capsule (100 mg total) by mouth at bedtime.   30 capsule   0     Last refill. Patient needs to make/keep appointmen ...   . gabapentin (NEURONTIN) 300 MG capsule   Oral   Take 1 capsule (300 mg total) by mouth daily. From mental health   90 capsule   3   . hydrochlorothiazide 25 MG tablet   Oral   Take 25 mg by mouth daily.           Marland Kitchen lisinopril (PRINIVIL,ZESTRIL) 30 MG tablet      TAKE 1 TABLET BY MOUTH DAILY   30 tablet   0     Last refill. Patient needs to make/keep appointmen ...   . traZODone (DESYREL) 100 MG tablet   Oral   Take 1 tablet (100 mg total) by mouth at bedtime. Originally rx's by mental health   30 tablet   0     Last refill. Patient needs to make/keep appointmen ...   . EXPIRED: zolpidem (AMBIEN) 10 MG tablet   Oral   Take 1 tablet (10 mg total) by mouth at bedtime as needed for sleep.   30 tablet   0     There were no vitals taken for this  visit.  Physical Exam  Nursing note and vitals reviewed. Constitutional: She is oriented to person, place, and time. She appears well-developed and well-nourished.  HENT:  Head: Normocephalic.  Right Ear: External ear normal.  Left Ear: External ear normal.  Nose: Nose normal.  Mouth/Throat: Oropharynx is clear and moist.  Eyes: Conjunctivae are normal. Pupils are equal, round, and reactive to light.  Neck: Normal range of motion. Neck supple.  Cardiovascular: Normal rate and normal heart sounds.   Pulmonary/Chest: Effort normal.  Abdominal: Soft.  Musculoskeletal:  Tender left thoracic and left lumbar spine,     Neurological: She is alert and oriented to person, place, and time. She has normal reflexes.  Skin: Skin is warm.  Psychiatric: She has a normal mood and affect.    ED Course  Procedures (including critical care time)  Labs Reviewed - No data to display No results found.   No diagnosis found.    MDM   xrays returned,  No fracture.  Pt reports relief with pain medications.       [place], PA-C 05/09/12 1513

## 2012-05-31 ENCOUNTER — Ambulatory Visit: Payer: Self-pay | Admitting: Infectious Diseases

## 2012-05-31 ENCOUNTER — Telehealth: Payer: Self-pay | Admitting: *Deleted

## 2012-05-31 NOTE — Telephone Encounter (Signed)
Left message on patient's home  Phone notifying her of her missed appointment today. Landis Gandy, RN

## 2012-06-01 ENCOUNTER — Other Ambulatory Visit: Payer: Self-pay | Admitting: *Deleted

## 2012-06-01 DIAGNOSIS — B20 Human immunodeficiency virus [HIV] disease: Secondary | ICD-10-CM

## 2012-06-01 DIAGNOSIS — G629 Polyneuropathy, unspecified: Secondary | ICD-10-CM

## 2012-06-01 DIAGNOSIS — I10 Essential (primary) hypertension: Secondary | ICD-10-CM

## 2012-06-01 MED ORDER — GABAPENTIN 100 MG PO CAPS: 100.0000 mg | ORAL_CAPSULE | Freq: Every day | ORAL | Status: DC

## 2012-06-01 MED ORDER — LISINOPRIL 30 MG PO TABS: 30.0000 mg | ORAL_TABLET | Freq: Every day | ORAL | Status: DC

## 2012-06-01 MED ORDER — EFAVIRENZ 600 MG PO TABS: 600.0000 mg | ORAL_TABLET | Freq: Every day | ORAL | Status: DC

## 2012-06-01 MED ORDER — ABACAVIR SULFATE-LAMIVUDINE 600-300 MG PO TABS: 1.0000 | ORAL_TABLET | Freq: Every day | ORAL | Status: DC

## 2012-06-01 MED ORDER — CLONIDINE HCL 0.1 MG PO TABS
0.1000 mg | ORAL_TABLET | Freq: Two times a day (BID) | ORAL | Status: DC
Start: 2012-06-01 — End: 2012-12-26

## 2012-06-01 NOTE — Telephone Encounter (Signed)
error 

## 2012-06-08 ENCOUNTER — Other Ambulatory Visit: Payer: Self-pay | Admitting: *Deleted

## 2012-06-08 DIAGNOSIS — F329 Major depressive disorder, single episode, unspecified: Secondary | ICD-10-CM

## 2012-06-08 MED ORDER — TRAZODONE HCL 100 MG PO TABS: 100.0000 mg | ORAL_TABLET | Freq: Every day | ORAL | Status: DC

## 2012-06-09 ENCOUNTER — Other Ambulatory Visit: Payer: Self-pay | Admitting: *Deleted

## 2012-06-09 DIAGNOSIS — F329 Major depressive disorder, single episode, unspecified: Secondary | ICD-10-CM

## 2012-06-09 MED ORDER — DIVALPROEX SODIUM 250 MG PO DR TAB: 250.0000 mg | DELAYED_RELEASE_TABLET | Freq: Every day | ORAL | Status: DC

## 2012-06-09 MED ORDER — DIVALPROEX SODIUM ER 500 MG PO TB24: 500.0000 mg | ORAL_TABLET | Freq: Every day | ORAL | Status: DC

## 2012-06-15 ENCOUNTER — Telehealth: Payer: Self-pay | Admitting: *Deleted

## 2012-06-15 DIAGNOSIS — F329 Major depressive disorder, single episode, unspecified: Secondary | ICD-10-CM

## 2012-06-15 MED ORDER — DIVALPROEX SODIUM ER 500 MG PO TB24: 500.0000 mg | ORAL_TABLET | Freq: Every day | ORAL | Status: DC

## 2012-06-15 NOTE — Telephone Encounter (Signed)
Pt needs to sign Release of Information for Mental Health provider at next [place].  Depakote is covered by [employer].  Pt does not have other insurance.  Noted on next appt information to have the pt sign a ROI for her Mental Health Provider.

## 2012-06-17 ENCOUNTER — Other Ambulatory Visit: Payer: Self-pay | Admitting: Infectious Diseases

## 2012-06-17 DIAGNOSIS — F329 Major depressive disorder, single episode, unspecified: Secondary | ICD-10-CM

## 2012-06-17 DIAGNOSIS — B20 Human immunodeficiency virus [HIV] disease: Secondary | ICD-10-CM

## 2012-06-17 MED ORDER — DIVALPROEX SODIUM 250 MG PO DR TAB: 250.0000 mg | DELAYED_RELEASE_TABLET | Freq: Every day | ORAL | Status: DC

## 2012-06-17 MED ORDER — DIVALPROEX SODIUM ER 500 MG PO TB24: 500.0000 mg | ORAL_TABLET | Freq: Every day | ORAL | Status: DC

## 2012-06-17 NOTE — Telephone Encounter (Signed)
rxs signed

## 2012-07-06 ENCOUNTER — Other Ambulatory Visit: Payer: Self-pay | Admitting: *Deleted

## 2012-07-06 DIAGNOSIS — B20 Human immunodeficiency virus [HIV] disease: Secondary | ICD-10-CM

## 2012-07-06 DIAGNOSIS — F329 Major depressive disorder, single episode, unspecified: Secondary | ICD-10-CM

## 2012-07-06 MED ORDER — EFAVIRENZ 600 MG PO TABS: 600.0000 mg | ORAL_TABLET | Freq: Every day | ORAL | Status: DC

## 2012-07-06 MED ORDER — ABACAVIR SULFATE-LAMIVUDINE 600-300 MG PO TABS: 1.0000 | ORAL_TABLET | Freq: Every day | ORAL | Status: DC

## 2012-07-06 MED ORDER — TRAZODONE HCL 100 MG PO TABS: 100.0000 mg | ORAL_TABLET | Freq: Every day | ORAL | Status: DC

## 2012-07-19 ENCOUNTER — Ambulatory Visit: Payer: Self-pay | Admitting: Infectious Diseases

## 2012-07-27 ENCOUNTER — Encounter: Payer: Self-pay | Admitting: Infectious Diseases

## 2012-07-27 ENCOUNTER — Ambulatory Visit: Payer: Self-pay

## 2012-07-27 ENCOUNTER — Ambulatory Visit (INDEPENDENT_AMBULATORY_CARE_PROVIDER_SITE_OTHER): Payer: No Typology Code available for payment source | Admitting: Infectious Diseases

## 2012-07-27 ENCOUNTER — Telehealth: Payer: Self-pay | Admitting: *Deleted

## 2012-07-27 VITALS — BP 152/84 | HR 81 | Temp 98.2°F | Ht 63.0 in | Wt 180.0 lb

## 2012-07-27 DIAGNOSIS — B20 Human immunodeficiency virus [HIV] disease: Secondary | ICD-10-CM

## 2012-07-27 DIAGNOSIS — G629 Polyneuropathy, unspecified: Secondary | ICD-10-CM

## 2012-07-27 DIAGNOSIS — I10 Essential (primary) hypertension: Secondary | ICD-10-CM

## 2012-07-27 DIAGNOSIS — G609 Hereditary and idiopathic neuropathy, unspecified: Secondary | ICD-10-CM

## 2012-07-27 DIAGNOSIS — R079 Chest pain, unspecified: Secondary | ICD-10-CM

## 2012-07-27 DIAGNOSIS — G4700 Insomnia, unspecified: Secondary | ICD-10-CM

## 2012-07-27 DIAGNOSIS — R87619 Unspecified abnormal cytological findings in specimens from cervix uteri: Secondary | ICD-10-CM

## 2012-07-27 LAB — CBC
Hemoglobin: 12.8 g/dL (ref 12.0–15.0)
Platelets: 243 10*3/uL (ref 150–400)
RBC: 3.87 MIL/uL (ref 3.87–5.11)
WBC: 7.3 10*3/uL (ref 4.0–10.5)

## 2012-07-27 LAB — COMPREHENSIVE METABOLIC PANEL
ALT: 46 U/L — ABNORMAL HIGH (ref 0–35)
Albumin: 4 g/dL (ref 3.5–5.2)
CO2: 18 mEq/L — ABNORMAL LOW (ref 19–32)
Calcium: 10.1 mg/dL (ref 8.4–10.5)
Chloride: 108 mEq/L (ref 96–112)
Glucose, Bld: 143 mg/dL — ABNORMAL HIGH (ref 70–99)
Potassium: 3.7 mEq/L (ref 3.5–5.3)
Sodium: 136 mEq/L (ref 135–145)
Total Bilirubin: 0.7 mg/dL (ref 0.3–1.2)
Total Protein: 8.2 g/dL (ref 6.0–8.3)

## 2012-07-27 MED ORDER — GABAPENTIN 300 MG PO CAPS: 300.0000 mg | ORAL_CAPSULE | Freq: Three times a day (TID) | ORAL | Status: DC

## 2012-07-27 MED ORDER — LISINOPRIL 40 MG PO TABS: 40.0000 mg | ORAL_TABLET | Freq: Every day | ORAL | Status: DC

## 2012-07-27 MED ORDER — TEMAZEPAM 15 MG PO CAPS: 15.0000 mg | ORAL_CAPSULE | Freq: Every evening | ORAL | Status: DC | PRN

## 2012-07-27 NOTE — Assessment & Plan Note (Signed)
Will increase her neurontin dose.

## 2012-07-27 NOTE — Addendum Note (Signed)
Addended by: Dolan Amen D on: 07/27/2012 02:39 PM   Modules accepted: Orders

## 2012-07-27 NOTE — Assessment & Plan Note (Signed)
Her PAP 05-2011 was NL. Will send her to [facility] for repeat.

## 2012-07-27 NOTE — Progress Notes (Signed)
  Subjective:    Patient ID: Sharon Norris, female    DOB: Jan 02, 1956, 57 y.o.   MRN: [id number]  HPI 57 yo F with hx of HIV+ since 1994 and Hep C (1a with prev VL 1,060,000). Was previous IVDA and was previously incarcerated.  Currently taking EFV/EPZ. Has had difficulty with sleep- unable to afford ambien, trazadone makes her agitated.  Has been having pain across her back. Has been taking ACE-I, clonidine but BP remains high. Continues to have headaches, dull pain down L arm and chest pain (heaviness/some one sitting on). Worse with laying flat. Has sweats, has bad taste in her mouth. Having pain and numbness in her feet and her hands. Awaiting dental eval. Awaiting renal eval.   HIV 1 RNA Quant (copies/mL)  Date Value  04/12/2012 31*  08/24/2011 <20   02/25/2011 37*     CD4 T Cell Abs (cmm)  Date Value  04/12/2012 310*  08/24/2011 260*  02/25/2011 260*    Review of Systems  Constitutional: Negative for appetite change.  Cardiovascular: Positive for chest pain.  Neurological: Positive for headaches.  Psychiatric/Behavioral: Positive for sleep disturbance.       Objective:   Physical Exam  Constitutional: She appears well-developed and well-nourished.  HENT:  Mouth/Throat: Abnormal dentition. Dental caries present. No oropharyngeal exudate.  Eyes: EOM are normal. Pupils are equal, round, and reactive to light.  Neck: Neck supple.  Cardiovascular: Normal rate, regular rhythm and normal heart sounds.   Pulmonary/Chest: Effort normal and breath sounds normal. She exhibits tenderness.  Abdominal: Soft. Bowel sounds are normal. There is no tenderness.  Lymphadenopathy:    She has no cervical adenopathy.          Assessment & Plan:

## 2012-07-27 NOTE — Assessment & Plan Note (Signed)
Will refer her for GXT

## 2012-07-27 NOTE — Telephone Encounter (Signed)
Faxed application to [facility] for Restoril today.

## 2012-07-27 NOTE — Assessment & Plan Note (Signed)
Will increase her ACE-I. See her back in 3-4 months.

## 2012-07-27 NOTE — Assessment & Plan Note (Signed)
She appears to be doing well. Will send her back to lab today. Offered/refused condoms. rtc 3-4 months.

## 2012-07-28 ENCOUNTER — Ambulatory Visit: Payer: Self-pay | Admitting: *Deleted

## 2012-07-28 LAB — T-HELPER CELL (CD4) - (RCID CLINIC ONLY)
CD4 % Helper T Cell: 16 % — ABNORMAL LOW (ref 33–55)
CD4 T Cell Abs: 300 uL — ABNORMAL LOW (ref 400–2700)

## 2012-07-28 LAB — TROPONIN I: Troponin I: 0.01 ng/mL (ref <0.06)

## 2012-07-29 ENCOUNTER — Other Ambulatory Visit: Payer: Self-pay | Admitting: Licensed Clinical Social Worker

## 2012-07-29 DIAGNOSIS — F329 Major depressive disorder, single episode, unspecified: Secondary | ICD-10-CM

## 2012-08-01 ENCOUNTER — Telehealth: Payer: Self-pay | Admitting: *Deleted

## 2012-08-01 NOTE — Telephone Encounter (Signed)
Called [name] to check status of Ms Kolek's application.  It has not been received.  Re-faxed.

## 2012-08-02 ENCOUNTER — Other Ambulatory Visit: Payer: Self-pay | Admitting: *Deleted

## 2012-08-02 ENCOUNTER — Ambulatory Visit: Payer: Self-pay | Admitting: *Deleted

## 2012-08-02 ENCOUNTER — Telehealth: Payer: Self-pay | Admitting: *Deleted

## 2012-08-02 DIAGNOSIS — F329 Major depressive disorder, single episode, unspecified: Secondary | ICD-10-CM

## 2012-08-02 DIAGNOSIS — R079 Chest pain, unspecified: Secondary | ICD-10-CM

## 2012-08-02 MED ORDER — TRAZODONE HCL 100 MG PO TABS: 100.0000 mg | ORAL_TABLET | Freq: Every day | ORAL | Status: DC

## 2012-08-02 NOTE — Telephone Encounter (Signed)
Received call from [facility] in Infectious Disease at [facility]. Dr. Johnnye Sima would like pt to have GXT. Pt has been having headaches, dull pain in left arm and chest tightness when lying flat. I checked with Emeline Darling, RN (office nurse manager) and this can be done in our office by NP or PA. I will place order and send to schedulers to contact pt with appt time. I left message with this information on triage voicemail in ID.

## 2012-08-02 NOTE — Telephone Encounter (Signed)
Referral request made to [facility] at [facility].  Per Fraser Din, order is in the system, [place] scheduler will contact patient and schedule an appointment. Landis Gandy, RN

## 2012-08-04 ENCOUNTER — Telehealth: Payer: Self-pay | Admitting: *Deleted

## 2012-08-04 NOTE — Telephone Encounter (Signed)
Signed Release of Information forms with incomplete information.  Pt agreed to come to [facility] to complete.  Forms placed at front desk for pt to complete.

## 2012-08-16 ENCOUNTER — Encounter: Payer: No Typology Code available for payment source | Admitting: Nurse Practitioner

## 2012-08-22 ENCOUNTER — Ambulatory Visit: Payer: Self-pay

## 2012-08-24 ENCOUNTER — Encounter: Payer: No Typology Code available for payment source | Admitting: Physician Assistant

## 2012-08-29 ENCOUNTER — Ambulatory Visit: Payer: Self-pay

## 2012-08-29 ENCOUNTER — Telehealth: Payer: Self-pay | Admitting: *Deleted

## 2012-08-29 NOTE — Telephone Encounter (Signed)
Requested pt call for new PAP smear appt.

## 2012-09-09 ENCOUNTER — Encounter: Payer: No Typology Code available for payment source | Admitting: Physician Assistant

## 2012-09-14 ENCOUNTER — Telehealth: Payer: Self-pay | Admitting: *Deleted

## 2012-09-14 NOTE — Telephone Encounter (Signed)
Received a call from Sharon Norris wanting to know about her Restoril.  Said she had returned the form from [place], but had not heard from them.  I called [name].  She has been approved.  She should receive her pharmacy card in the mail within the next 7 days.  She says she does not have transportation to go to the pharmacy with the card to get her medication.  I will call [name] back and see if there is any way the medication can be sent directly to Sharon Norris.

## 2012-09-15 ENCOUNTER — Telehealth: Payer: Self-pay | Admitting: *Deleted

## 2012-09-15 NOTE — Telephone Encounter (Signed)
Called [name] to see if the medication can be sent to [name].  There is only a pharmacy card. There is also a $20.00 per month co-pay.  I called [name] and informed her of what [name] said.  She still wants the medication.  Card should arrive within the next 7 days.

## 2012-10-12 ENCOUNTER — Other Ambulatory Visit: Payer: Self-pay | Admitting: *Deleted

## 2012-10-12 DIAGNOSIS — F329 Major depressive disorder, single episode, unspecified: Secondary | ICD-10-CM

## 2012-10-12 MED ORDER — DIVALPROEX SODIUM 250 MG PO DR TAB: 250.0000 mg | DELAYED_RELEASE_TABLET | Freq: Every day | ORAL | Status: DC

## 2012-10-12 MED ORDER — DIVALPROEX SODIUM ER 500 MG PO TB24: 500.0000 mg | ORAL_TABLET | Freq: Every day | ORAL | Status: DC

## 2012-10-26 ENCOUNTER — Other Ambulatory Visit: Payer: Self-pay

## 2012-10-27 ENCOUNTER — Other Ambulatory Visit (INDEPENDENT_AMBULATORY_CARE_PROVIDER_SITE_OTHER): Payer: No Typology Code available for payment source

## 2012-10-27 DIAGNOSIS — B20 Human immunodeficiency virus [HIV] disease: Secondary | ICD-10-CM

## 2012-10-27 LAB — CBC
Hemoglobin: 12.4 g/dL (ref 12.0–15.0)
MCV: 97.8 fL (ref 78.0–100.0)
Platelets: 264 10*3/uL (ref 150–400)
RBC: 3.65 MIL/uL — ABNORMAL LOW (ref 3.87–5.11)
WBC: 7 10*3/uL (ref 4.0–10.5)

## 2012-10-27 LAB — COMPREHENSIVE METABOLIC PANEL
ALT: 37 U/L — ABNORMAL HIGH (ref 0–35)
CO2: 19 mEq/L (ref 19–32)
Calcium: 9.1 mg/dL (ref 8.4–10.5)
Chloride: 111 mEq/L (ref 96–112)
Creat: 1.15 mg/dL — ABNORMAL HIGH (ref 0.50–1.10)
Glucose, Bld: 115 mg/dL — ABNORMAL HIGH (ref 70–99)
Total Protein: 7.5 g/dL (ref 6.0–8.3)

## 2012-10-28 LAB — HEPATITIS B SURFACE ANTIBODY,QUALITATIVE: Hep B S Ab: NEGATIVE

## 2012-11-09 ENCOUNTER — Ambulatory Visit: Payer: Self-pay | Admitting: Infectious Diseases

## 2012-11-21 ENCOUNTER — Encounter: Payer: Self-pay | Admitting: Infectious Diseases

## 2012-11-21 ENCOUNTER — Ambulatory Visit (INDEPENDENT_AMBULATORY_CARE_PROVIDER_SITE_OTHER): Payer: No Typology Code available for payment source | Admitting: Infectious Diseases

## 2012-11-21 VITALS — BP 152/87 | HR 58 | Temp 98.1°F | Ht 63.0 in | Wt 173.0 lb

## 2012-11-21 DIAGNOSIS — G4700 Insomnia, unspecified: Secondary | ICD-10-CM

## 2012-11-21 DIAGNOSIS — B1710 Acute hepatitis C without hepatic coma: Secondary | ICD-10-CM

## 2012-11-21 DIAGNOSIS — Z23 Encounter for immunization: Secondary | ICD-10-CM

## 2012-11-21 DIAGNOSIS — B20 Human immunodeficiency virus [HIV] disease: Secondary | ICD-10-CM

## 2012-11-21 MED ORDER — TEMAZEPAM 15 MG PO CAPS: 15.0000 mg | ORAL_CAPSULE | Freq: Every evening | ORAL | Status: DC | PRN

## 2012-11-21 NOTE — Assessment & Plan Note (Signed)
Will refill restoril

## 2012-11-21 NOTE — Assessment & Plan Note (Signed)
She will restart Hep B series. She is hep A immune. Watch LFTs

## 2012-11-21 NOTE — Addendum Note (Signed)
Addended by: [name] A on: 11/21/2012 05:18 PM   Modules accepted: Orders

## 2012-11-21 NOTE — Assessment & Plan Note (Signed)
She is doing well. Will get her set up for dental, PAP, (she had mammo by her report), get her re-established with [facility]. Restart hep B series. She gets flu shot today. She is given condoms (states she gives them to her kids). See her back in 6 months.

## 2012-11-21 NOTE — Progress Notes (Signed)
  Subjective:    Patient ID: Sharon Norris, female    DOB: 02-Mar-1955, 57 y.o.   MRN: [id number]  HPI 57 yo F with hx of HIV+ since 1994 and Hep C (1a with prev VL 1,060,000). Was previous IVDA and was previously incarcerated.  Currently taking EFV/EPZ. No missed.  Still has had difficulty with sleep. Unable to afford restoril.  Has been feeling depressed a lot- family, trying to get disability. Tammy at [facility].  HIV 1 RNA Quant (copies/mL)  Date Value  10/27/2012 <20   07/27/2012 67*  04/12/2012 31*     CD4 T Cell Abs (/uL)  Date Value  10/27/2012 400   07/27/2012 300*  04/12/2012 310*      Review of Systems  Constitutional: Negative for appetite change and unexpected weight change.  Gastrointestinal: Positive for constipation. Negative for diarrhea.  Psychiatric/Behavioral: Positive for sleep disturbance.       Objective:   Physical Exam  Constitutional: She appears well-developed and well-nourished.  HENT:  Mouth/Throat: Dental caries present. No oropharyngeal exudate.  Eyes: EOM are normal. Pupils are equal, round, and reactive to light.  Neck: Neck supple.  Cardiovascular: Normal rate, regular rhythm and normal heart sounds.   Pulmonary/Chest: Effort normal and breath sounds normal.  Abdominal: Soft. Bowel sounds are normal. She exhibits no distension. There is no tenderness.  Lymphadenopathy:    She has no cervical adenopathy.          Assessment & Plan:

## 2012-12-26 ENCOUNTER — Other Ambulatory Visit: Payer: Self-pay | Admitting: *Deleted

## 2012-12-26 DIAGNOSIS — I10 Essential (primary) hypertension: Secondary | ICD-10-CM

## 2012-12-26 MED ORDER — CLONIDINE HCL 0.1 MG PO TABS: 0.1000 mg | ORAL_TABLET | Freq: Two times a day (BID) | ORAL | Status: DC

## 2013-01-02 ENCOUNTER — Telehealth: Payer: Self-pay | Admitting: *Deleted

## 2013-01-02 NOTE — Telephone Encounter (Signed)
Appointment scheduled for 01/30/13.  Pt requested that a bus pass be mailed to her to come to the appt.  Bus pass being mailed with note attached that it is to be used to come for PAP smear appt.  Pt informed that she will receive another bus pass to go home when she comes for her PAP appointment.  Pt verbalized understanding.

## 2013-01-20 ENCOUNTER — Other Ambulatory Visit: Payer: Self-pay | Admitting: *Deleted

## 2013-01-20 DIAGNOSIS — G629 Polyneuropathy, unspecified: Secondary | ICD-10-CM

## 2013-01-20 MED ORDER — GABAPENTIN 300 MG PO CAPS: 300.0000 mg | ORAL_CAPSULE | Freq: Three times a day (TID) | ORAL | Status: DC

## 2013-01-30 ENCOUNTER — Ambulatory Visit: Payer: Self-pay

## 2013-02-06 ENCOUNTER — Ambulatory Visit: Payer: Self-pay

## 2013-03-02 ENCOUNTER — Ambulatory Visit: Payer: Medicaid Other

## 2013-03-02 ENCOUNTER — Ambulatory Visit: Payer: Self-pay

## 2013-03-02 DIAGNOSIS — F3162 Bipolar disorder, current episode mixed, moderate: Secondary | ICD-10-CM

## 2013-03-02 DIAGNOSIS — F4310 Post-traumatic stress disorder, unspecified: Secondary | ICD-10-CM

## 2013-03-02 NOTE — Progress Notes (Signed)
I met with Sharon Norris for the first time today and she reported that she has a diagnosis of Bipolar Disorder and used to see Leeanne Mannan, both here and at [facility].  After we talked some it became clear that she has a history of trauma and meets criteria for PTSD.  She said Alyssa told her this as well.  I explained to her some basics of the trauma therapy, EMDR, and told her that I have training in this.  She seemed very encouraged by this and said she has flashbacks almost daily.  She also stays in her apartment most of the time due to her high anxiety and history of panic attacks.  She also talked about her depression.  I went over the basics of cognitive behavior therapy, explaining how thoughts affect feelings and giving her a list of Quay Burow' Cognitive Distortions.  Plan to meet in 5 weeks or sooner if a slot opens in the schedule.

## 2013-03-03 ENCOUNTER — Other Ambulatory Visit: Payer: Self-pay | Admitting: Licensed Clinical Social Worker

## 2013-03-03 DIAGNOSIS — B20 Human immunodeficiency virus [HIV] disease: Secondary | ICD-10-CM

## 2013-03-03 MED ORDER — ABACAVIR SULFATE-LAMIVUDINE 600-300 MG PO TABS: 1.0000 | ORAL_TABLET | Freq: Every day | ORAL | Status: DC

## 2013-03-03 MED ORDER — EFAVIRENZ 600 MG PO TABS: 600.0000 mg | ORAL_TABLET | Freq: Every day | ORAL | Status: DC

## 2013-03-08 ENCOUNTER — Other Ambulatory Visit: Payer: Self-pay | Admitting: *Deleted

## 2013-03-08 DIAGNOSIS — B20 Human immunodeficiency virus [HIV] disease: Secondary | ICD-10-CM

## 2013-03-08 MED ORDER — ABACAVIR SULFATE-LAMIVUDINE 600-300 MG PO TABS: 1.0000 | ORAL_TABLET | Freq: Every day | ORAL | Status: DC

## 2013-03-08 MED ORDER — EFAVIRENZ 600 MG PO TABS: 600.0000 mg | ORAL_TABLET | Freq: Every day | ORAL | Status: DC

## 2013-03-20 ENCOUNTER — Other Ambulatory Visit: Payer: Self-pay | Admitting: Licensed Clinical Social Worker

## 2013-03-20 DIAGNOSIS — B20 Human immunodeficiency virus [HIV] disease: Secondary | ICD-10-CM

## 2013-03-20 MED ORDER — EFAVIRENZ 600 MG PO TABS: 600.0000 mg | ORAL_TABLET | Freq: Every day | ORAL | Status: DC

## 2013-03-20 MED ORDER — ABACAVIR SULFATE-LAMIVUDINE 600-300 MG PO TABS: 1.0000 | ORAL_TABLET | Freq: Every day | ORAL | Status: DC

## 2013-04-10 ENCOUNTER — Ambulatory Visit: Payer: Self-pay

## 2013-04-17 ENCOUNTER — Ambulatory Visit: Payer: Self-pay

## 2013-04-24 ENCOUNTER — Ambulatory Visit: Payer: Self-pay

## 2013-05-01 ENCOUNTER — Ambulatory Visit: Payer: Self-pay

## 2013-05-02 ENCOUNTER — Other Ambulatory Visit: Payer: Self-pay | Admitting: Infectious Diseases

## 2013-05-03 NOTE — Telephone Encounter (Signed)
Discuss at follow up appt with Dr. Johnnye Sima

## 2013-05-22 ENCOUNTER — Other Ambulatory Visit: Payer: Self-pay

## 2013-05-25 ENCOUNTER — Observation Stay (HOSPITAL_COMMUNITY)
Admission: EM | Admit: 2013-05-25 | Discharge: 2013-05-27 | Disposition: A | Discharge status: Home / Self Care | Payer: Medicaid Other | Attending: Internal Medicine | Admitting: Internal Medicine

## 2013-05-25 ENCOUNTER — Emergency Department (HOSPITAL_COMMUNITY): Payer: Medicaid Other

## 2013-05-25 ENCOUNTER — Encounter (HOSPITAL_COMMUNITY): Payer: Self-pay | Admitting: Emergency Medicine

## 2013-05-25 DIAGNOSIS — G609 Hereditary and idiopathic neuropathy, unspecified: Secondary | ICD-10-CM

## 2013-05-25 DIAGNOSIS — I129 Hypertensive chronic kidney disease with stage 1 through stage 4 chronic kidney disease, or unspecified chronic kidney disease: Secondary | ICD-10-CM | POA: Insufficient documentation

## 2013-05-25 DIAGNOSIS — B1920 Unspecified viral hepatitis C without hepatic coma: Secondary | ICD-10-CM | POA: Insufficient documentation

## 2013-05-25 DIAGNOSIS — N289 Disorder of kidney and ureter, unspecified: Secondary | ICD-10-CM

## 2013-05-25 DIAGNOSIS — R42 Dizziness and giddiness: Secondary | ICD-10-CM | POA: Insufficient documentation

## 2013-05-25 DIAGNOSIS — N183 Chronic kidney disease, stage 3 unspecified: Secondary | ICD-10-CM

## 2013-05-25 DIAGNOSIS — F3289 Other specified depressive episodes: Secondary | ICD-10-CM | POA: Insufficient documentation

## 2013-05-25 DIAGNOSIS — I10 Essential (primary) hypertension: Secondary | ICD-10-CM

## 2013-05-25 DIAGNOSIS — Z21 Asymptomatic human immunodeficiency virus [HIV] infection status: Secondary | ICD-10-CM | POA: Insufficient documentation

## 2013-05-25 DIAGNOSIS — N182 Chronic kidney disease, stage 2 (mild): Secondary | ICD-10-CM | POA: Diagnosis present

## 2013-05-25 DIAGNOSIS — I160 Hypertensive urgency: Secondary | ICD-10-CM | POA: Diagnosis present

## 2013-05-25 DIAGNOSIS — G629 Polyneuropathy, unspecified: Secondary | ICD-10-CM | POA: Diagnosis present

## 2013-05-25 DIAGNOSIS — R079 Chest pain, unspecified: Principal | ICD-10-CM | POA: Diagnosis present

## 2013-05-25 DIAGNOSIS — Z87891 Personal history of nicotine dependence: Secondary | ICD-10-CM | POA: Insufficient documentation

## 2013-05-25 DIAGNOSIS — B20 Human immunodeficiency virus [HIV] disease: Secondary | ICD-10-CM | POA: Diagnosis present

## 2013-05-25 DIAGNOSIS — F329 Major depressive disorder, single episode, unspecified: Secondary | ICD-10-CM

## 2013-05-25 DIAGNOSIS — H532 Diplopia: Secondary | ICD-10-CM | POA: Insufficient documentation

## 2013-05-25 DIAGNOSIS — F411 Generalized anxiety disorder: Secondary | ICD-10-CM | POA: Insufficient documentation

## 2013-05-25 DIAGNOSIS — B182 Chronic viral hepatitis C: Secondary | ICD-10-CM | POA: Diagnosis present

## 2013-05-25 HISTORY — DX: Inflammatory liver disease, unspecified: K75.9

## 2013-05-25 LAB — URINE MICROSCOPIC-ADD ON

## 2013-05-25 LAB — COMPREHENSIVE METABOLIC PANEL
ALBUMIN: 3.4 g/dL — AB (ref 3.5–5.2)
ALT: 59 U/L — ABNORMAL HIGH (ref 0–35)
AST: 71 U/L — AB (ref 0–37)
Alkaline Phosphatase: 165 U/L — ABNORMAL HIGH (ref 39–117)
BUN: 18 mg/dL (ref 6–23)
CO2: 18 mEq/L — ABNORMAL LOW (ref 19–32)
CREATININE: 1.03 mg/dL (ref 0.50–1.10)
Calcium: 9.7 mg/dL (ref 8.4–10.5)
Chloride: 105 mEq/L (ref 96–112)
GFR calc Af Amer: 68 mL/min — ABNORMAL LOW (ref >90)
GFR calc non Af Amer: 58 mL/min — ABNORMAL LOW (ref >90)
Glucose, Bld: 100 mg/dL — ABNORMAL HIGH (ref 70–99)
Potassium: 4.9 mEq/L (ref 3.7–5.3)
Sodium: 140 mEq/L (ref 137–147)
TOTAL PROTEIN: 8.4 g/dL — AB (ref 6.0–8.3)
Total Bilirubin: 0.4 mg/dL (ref 0.3–1.2)

## 2013-05-25 LAB — URINALYSIS, ROUTINE W REFLEX MICROSCOPIC
Bilirubin Urine: NEGATIVE
Glucose, UA: NEGATIVE mg/dL
Hgb urine dipstick: NEGATIVE
Ketones, ur: NEGATIVE mg/dL
NITRITE: NEGATIVE
Protein, ur: 30 mg/dL — AB
SPECIFIC GRAVITY, URINE: 1.012 (ref 1.005–1.030)
UROBILINOGEN UA: 1 mg/dL (ref 0.0–1.0)
pH: 6.5 (ref 5.0–8.0)

## 2013-05-25 LAB — I-STAT TROPONIN, ED
TROPONIN I, POC: 0.01 ng/mL (ref 0.00–0.08)
Troponin i, poc: 0.01 ng/mL (ref 0.00–0.08)

## 2013-05-25 LAB — CBC
HEMATOCRIT: 35.8 % — AB (ref 36.0–46.0)
Hemoglobin: 12.4 g/dL (ref 12.0–15.0)
MCH: 35.2 pg — ABNORMAL HIGH (ref 26.0–34.0)
MCHC: 34.6 g/dL (ref 30.0–36.0)
MCV: 101.7 fL — ABNORMAL HIGH (ref 78.0–100.0)
PLATELETS: 210 10*3/uL (ref 150–400)
RBC: 3.52 MIL/uL — ABNORMAL LOW (ref 3.87–5.11)
RDW: 13.2 % (ref 11.5–15.5)
WBC: 8.1 10*3/uL (ref 4.0–10.5)

## 2013-05-25 MED ORDER — NITROGLYCERIN IN D5W 200-5 MCG/ML-% IV SOLN: 5.0000 ug/min | INTRAVENOUS | Status: DC

## 2013-05-25 MED ORDER — CLONIDINE HCL 0.1 MG PO TABS
0.1000 mg | ORAL_TABLET | Freq: Once | ORAL | Status: AC
  Administered 2013-05-25: 0.1 mg via ORAL
  Filled 2013-05-25: qty 1

## 2013-05-25 MED ORDER — AMLODIPINE BESYLATE 5 MG PO TABS
5.0000 mg | ORAL_TABLET | Freq: Once | ORAL | Status: DC
  Filled 2013-05-25: qty 1

## 2013-05-25 NOTE — ED Notes (Signed)
Unsuccessful IV attempt x2, Dr. Wyvonnia Dusky tried to assess for possible EJ with no success. IV team paged.

## 2013-05-25 NOTE — ED Notes (Signed)
Pt arrives from home for eval of cp that started last night, pt reports nausea last night but denies any nausea today. EMS reports that pt has been having intermittent pressure in her chest since last night, and has reported left arm numbness that started today as well. Pt given 324 mg of aspirin and 1 nitro with relief. Pt denies sob, EMS reports unremarkable EKG. Axo, nad noted, skin warm and dry.

## 2013-05-25 NOTE — ED Provider Notes (Signed)
CSN: [id number]     Arrival date & time 05/25/13  1836 History   First MD Initiated Contact with Patient 05/25/13 1837     Chief Complaint  Patient presents with  . Chest Pain     (Consider location/radiation/quality/duration/timing/severity/associated sxs/prior Treatment) Patient is a 58 y.o. female presenting with chest pain. The history is provided by the patient and the EMS personnel. No language interpreter was used.  Chest Pain Pain location:  Substernal area Pain quality: pressure   Pain radiates to the back: no   Pain severity:  Mild (after nitro/asa by EMS) Onset quality:  Gradual Duration:  18 hours Progression:  Worsening Chronicity:  New Context: breathing and lifting   Relieved by:  Aspirin and nitroglycerin Worsened by:  Exertion Associated symptoms: nausea and numbness   Associated symptoms: no shortness of breath and not vomiting   Patient states she developed a pressure like substernal chest pain last evening that was preceded by left lateral neck pain and left arm numbness originally onset after lifting her 50 lb grandchild. Mother with late adult onset cardiac history, living, age 65.  Past Medical History  Diagnosis Date  . HIV infection   . Hypertension   . Neuropathy   . Anxiety   . Depression   . Sleep difficulties   . H/O drug abuse     IV drugs, drug free 12 years   Past Surgical History  Procedure Laterality Date  . Colposcopy  2010   Family History  Problem Relation Age of Onset  . Alcohol abuse Father   . Cancer Maternal Aunt     endometrial  . Cancer Sister     endometrial  . Cancer Sister     breast   History  Substance Use Topics  . Smoking status: Former Smoker    Quit date: 02/17/2000  . Smokeless tobacco: Never Used  . Alcohol Use: No     Comment: beer   OB History   Grav Para Term Preterm Abortions TAB SAB Ect Mult Living   3 2 2  1 1          Review of Systems  Respiratory: Negative for shortness of breath.     Cardiovascular: Positive for chest pain.  Gastrointestinal: Positive for nausea. Negative for vomiting.  Musculoskeletal: Positive for neck pain.  Neurological: Positive for numbness.  All other systems reviewed and are negative.     Allergies  Review of patient's allergies indicates no known allergies.  Home Medications   Current Outpatient Rx  Name  Route  Sig  Dispense  Refill  . abacavir-lamiVUDine (EPZICOM) 600-300 MG per tablet   Oral   Take 1 tablet by mouth at bedtime.   30 tablet   5   . cloNIDine (CATAPRES) 0.1 MG tablet   Oral   Take 1 tablet (0.1 mg total) by mouth 2 (two) times daily.   60 tablet   5   . divalproex (DEPAKOTE ER) 500 MG 24 hr tablet      TAKE 1 TABLET BY MOUTH EVERY MORNING   30 tablet   0   . divalproex (DEPAKOTE) 250 MG DR tablet      TAKE 1 TABLET BY MOUTH EVERY NIGHT AT BEDTIME   30 tablet   0   . efavirenz (SUSTIVA) 600 MG tablet   Oral   Take 1 tablet (600 mg total) by mouth at bedtime.   30 tablet   5   . gabapentin (NEURONTIN) 300 MG capsule  Oral   Take 1 capsule (300 mg total) by mouth 3 (three) times daily.   90 capsule   3   . hydrochlorothiazide 25 MG tablet   Oral   Take 25 mg by mouth daily.           Marland Kitchen lisinopril (PRINIVIL,ZESTRIL) 40 MG tablet   Oral   Take 1 tablet (40 mg total) by mouth daily.   90 tablet   5   . temazepam (RESTORIL) 15 MG capsule   Oral   Take 1 capsule (15 mg total) by mouth at bedtime as needed for sleep.   30 capsule   3    SpO2 100% Physical Exam  Nursing note and vitals reviewed. Constitutional: She is oriented to person, place, and time. She appears well-developed and well-nourished.  HENT:  Head: Normocephalic and atraumatic.  Eyes: Pupils are equal, round, and reactive to light.  Neck: Normal range of motion. Tracheal tenderness present.    Cardiovascular: Normal rate and regular rhythm.   Pulmonary/Chest: Effort normal and breath sounds normal. No respiratory  distress. She has no wheezes. She exhibits tenderness.  Abdominal: Soft. Bowel sounds are normal.  Musculoskeletal: She exhibits no edema.  Lymphadenopathy:    She has no cervical adenopathy.  Neurological: She is alert and oriented to person, place, and time.  Skin: Skin is warm and dry.  Psychiatric: She has a normal mood and affect. Her behavior is normal. Judgment and thought content normal.    ED Course  Procedures (including critical care time) Labs Review Labs Reviewed - No data to display Imaging Review No results found.   EKG Interpretation None      Date: 05/25/2013  Rate: 60  Rhythm: normal sinus rhythm  QRS Axis: normal  Intervals: normal  ST/T Wave abnormalities: normal  Conduction Disutrbances:none  Narrative Interpretation: NSR, LVH  Old EKG Reviewed: unchanged   Highly suspicious history in this 58 year old female with HIV, HTN, heart score of 4.  Will request admission for ACS work-up. MDM   Final diagnoses:  None    Chest pain.    Norman Herrlich, NP 05/25/13 [phone]

## 2013-05-25 NOTE — ED Notes (Signed)
Dr. Patel at bedside 

## 2013-05-25 NOTE — ED Notes (Signed)
IV team returned page, states they will come try to start IV.

## 2013-05-26 ENCOUNTER — Encounter (HOSPITAL_COMMUNITY): Payer: Self-pay | Admitting: *Deleted

## 2013-05-26 ENCOUNTER — Observation Stay (HOSPITAL_COMMUNITY): Payer: Medicaid Other

## 2013-05-26 DIAGNOSIS — R072 Precordial pain: Secondary | ICD-10-CM

## 2013-05-26 DIAGNOSIS — R079 Chest pain, unspecified: Secondary | ICD-10-CM

## 2013-05-26 DIAGNOSIS — B20 Human immunodeficiency virus [HIV] disease: Secondary | ICD-10-CM

## 2013-05-26 DIAGNOSIS — I10 Essential (primary) hypertension: Secondary | ICD-10-CM

## 2013-05-26 DIAGNOSIS — N182 Chronic kidney disease, stage 2 (mild): Secondary | ICD-10-CM | POA: Diagnosis present

## 2013-05-26 DIAGNOSIS — I160 Hypertensive urgency: Secondary | ICD-10-CM | POA: Diagnosis present

## 2013-05-26 DIAGNOSIS — F329 Major depressive disorder, single episode, unspecified: Secondary | ICD-10-CM

## 2013-05-26 LAB — CBC WITH DIFFERENTIAL/PLATELET
BASOS ABS: 0 10*3/uL (ref 0.0–0.1)
Basophils Relative: 0 % (ref 0–1)
Eosinophils Absolute: 0.1 10*3/uL (ref 0.0–0.7)
Eosinophils Relative: 1 % (ref 0–5)
HEMATOCRIT: 31.9 % — AB (ref 36.0–46.0)
HEMOGLOBIN: 11 g/dL — AB (ref 12.0–15.0)
LYMPHS ABS: 2 10*3/uL (ref 0.7–4.0)
Lymphocytes Relative: 29 % (ref 12–46)
MCH: 35.3 pg — ABNORMAL HIGH (ref 26.0–34.0)
MCHC: 34.5 g/dL (ref 30.0–36.0)
MCV: 102.2 fL — AB (ref 78.0–100.0)
MONO ABS: 0.6 10*3/uL (ref 0.1–1.0)
MONOS PCT: 8 % (ref 3–12)
NEUTROS ABS: 4.2 10*3/uL (ref 1.7–7.7)
Neutrophils Relative %: 62 % (ref 43–77)
Platelets: 200 10*3/uL (ref 150–400)
RBC: 3.12 MIL/uL — AB (ref 3.87–5.11)
RDW: 13.2 % (ref 11.5–15.5)
WBC: 6.9 10*3/uL (ref 4.0–10.5)

## 2013-05-26 LAB — COMPREHENSIVE METABOLIC PANEL
ALT: 48 U/L — ABNORMAL HIGH (ref 0–35)
AST: 60 U/L — AB (ref 0–37)
Albumin: 2.9 g/dL — ABNORMAL LOW (ref 3.5–5.2)
Alkaline Phosphatase: 146 U/L — ABNORMAL HIGH (ref 39–117)
BILIRUBIN TOTAL: 0.5 mg/dL (ref 0.3–1.2)
BUN: 18 mg/dL (ref 6–23)
CHLORIDE: 107 meq/L (ref 96–112)
CO2: 16 mEq/L — ABNORMAL LOW (ref 19–32)
Calcium: 9.2 mg/dL (ref 8.4–10.5)
Creatinine, Ser: 1.07 mg/dL (ref 0.50–1.10)
GFR, EST AFRICAN AMERICAN: 65 mL/min — AB (ref >90)
GFR, EST NON AFRICAN AMERICAN: 56 mL/min — AB (ref >90)
Glucose, Bld: 86 mg/dL (ref 70–99)
Potassium: 4.2 mEq/L (ref 3.7–5.3)
Sodium: 139 mEq/L (ref 137–147)
Total Protein: 7.3 g/dL (ref 6.0–8.3)

## 2013-05-26 LAB — TROPONIN I
Troponin I: <0.3 ng/mL (ref <0.30)
Troponin I: <0.3 ng/mL (ref <0.30)

## 2013-05-26 LAB — MRSA PCR SCREENING: MRSA BY PCR: NEGATIVE

## 2013-05-26 MED ORDER — LISINOPRIL 40 MG PO TABS
40.0000 mg | ORAL_TABLET | Freq: Every day | ORAL | Status: DC
  Administered 2013-05-26 – 2013-05-27 (×2): 40 mg via ORAL
  Filled 2013-05-26 (×2): qty 1

## 2013-05-26 MED ORDER — KETOROLAC TROMETHAMINE 15 MG/ML IJ SOLN
15.0000 mg | Freq: Once | INTRAMUSCULAR | Status: AC
  Administered 2013-05-26: 15 mg via INTRAVENOUS
  Filled 2013-05-26: qty 1

## 2013-05-26 MED ORDER — DIVALPROEX SODIUM ER 500 MG PO TB24
500.0000 mg | ORAL_TABLET | Freq: Every morning | ORAL | Status: DC
  Administered 2013-05-26 – 2013-05-27 (×2): 500 mg via ORAL
  Filled 2013-05-26 (×2): qty 1

## 2013-05-26 MED ORDER — ENOXAPARIN SODIUM 40 MG/0.4ML ~~LOC~~ SOLN
40.0000 mg | SUBCUTANEOUS | Status: DC
  Administered 2013-05-26 – 2013-05-27 (×2): 40 mg via SUBCUTANEOUS
  Filled 2013-05-26 (×2): qty 0.4

## 2013-05-26 MED ORDER — MORPHINE SULFATE 2 MG/ML IJ SOLN
2.0000 mg | INTRAMUSCULAR | Status: DC | PRN
  Administered 2013-05-26 (×2): 2 mg via INTRAVENOUS
  Filled 2013-05-26: qty 1

## 2013-05-26 MED ORDER — DIPHENHYDRAMINE HCL 25 MG PO CAPS: 25.0000 mg | ORAL_CAPSULE | Freq: Every evening | ORAL | Status: DC | PRN

## 2013-05-26 MED ORDER — ACETAMINOPHEN 500 MG PO TABS: 500.0000 mg | ORAL_TABLET | Freq: Every evening | ORAL | Status: DC | PRN

## 2013-05-26 MED ORDER — LAMIVUDINE 150 MG PO TABS
300.0000 mg | ORAL_TABLET | Freq: Every day | ORAL | Status: DC
  Administered 2013-05-26 (×2): 300 mg via ORAL
  Filled 2013-05-26 (×3): qty 2

## 2013-05-26 MED ORDER — ABACAVIR SULFATE-LAMIVUDINE 600-300 MG PO TABS
1.0000 | ORAL_TABLET | Freq: Every day | ORAL | Status: DC
  Filled 2013-05-26: qty 1

## 2013-05-26 MED ORDER — GABAPENTIN 300 MG PO CAPS
300.0000 mg | ORAL_CAPSULE | Freq: Three times a day (TID) | ORAL | Status: DC
  Administered 2013-05-26 – 2013-05-27 (×4): 300 mg via ORAL
  Filled 2013-05-26 (×6): qty 1

## 2013-05-26 MED ORDER — ABACAVIR SULFATE 300 MG PO TABS
600.0000 mg | ORAL_TABLET | Freq: Every day | ORAL | Status: DC
  Administered 2013-05-26 (×2): 600 mg via ORAL
  Filled 2013-05-26 (×3): qty 2

## 2013-05-26 MED ORDER — EFAVIRENZ 600 MG PO TABS
600.0000 mg | ORAL_TABLET | Freq: Every day | ORAL | Status: DC
  Administered 2013-05-26 (×2): 600 mg via ORAL
  Filled 2013-05-26 (×3): qty 1

## 2013-05-26 MED ORDER — HYDROCHLOROTHIAZIDE 25 MG PO TABS
25.0000 mg | ORAL_TABLET | Freq: Every day | ORAL | Status: DC
  Administered 2013-05-26 – 2013-05-27 (×2): 25 mg via ORAL
  Filled 2013-05-26 (×2): qty 1

## 2013-05-26 MED ORDER — GI COCKTAIL ~~LOC~~: 30.0000 mL | Freq: Four times a day (QID) | ORAL | Status: DC | PRN

## 2013-05-26 MED ORDER — DIPHENHYDRAMINE-APAP (SLEEP) 25-500 MG PO TABS: 1.0000 | ORAL_TABLET | Freq: Every evening | ORAL | Status: DC | PRN

## 2013-05-26 MED ORDER — ASPIRIN EC 81 MG PO TBEC
81.0000 mg | DELAYED_RELEASE_TABLET | Freq: Every day | ORAL | Status: DC
  Administered 2013-05-26 – 2013-05-27 (×2): 81 mg via ORAL
  Filled 2013-05-26 (×2): qty 1

## 2013-05-26 MED ORDER — IBUPROFEN 200 MG PO CAPS: 2.0000 | ORAL_CAPSULE | Freq: Three times a day (TID) | ORAL | Status: DC | PRN

## 2013-05-26 MED ORDER — CLONIDINE HCL 0.1 MG PO TABS
0.1000 mg | ORAL_TABLET | Freq: Two times a day (BID) | ORAL | Status: DC
  Administered 2013-05-26 – 2013-05-27 (×3): 0.1 mg via ORAL
  Filled 2013-05-26 (×4): qty 1

## 2013-05-26 MED ORDER — DIVALPROEX SODIUM 250 MG PO DR TAB
250.0000 mg | DELAYED_RELEASE_TABLET | Freq: Every evening | ORAL | Status: DC
  Administered 2013-05-26: 250 mg via ORAL
  Filled 2013-05-26 (×2): qty 1

## 2013-05-26 MED ORDER — NITROGLYCERIN 2 % TD OINT
0.5000 [in_us] | TOPICAL_OINTMENT | Freq: Four times a day (QID) | TRANSDERMAL | Status: DC
  Administered 2013-05-26 – 2013-05-27 (×7): 0.5 [in_us] via TOPICAL
  Filled 2013-05-26: qty 30

## 2013-05-26 NOTE — Progress Notes (Signed)
Pt. Chest pain was relieved, pt. Stated she's feeling better.

## 2013-05-26 NOTE — ED Provider Notes (Signed)
Medical screening examination/treatment/procedure(s) were conducted as a shared visit with non-physician practitioner(s) and myself.  I personally evaluated the patient during the encounter.  Hx HIV and previous IVDA with L sided chest pain radiating to L arm intermittent over past week. Improved with NTG.  LVH on EKG without acute changes. Hypertensive to 200 initially.  Chest pain free on arrival. Moderate suspicion for ACS.  PE and dissection less likely.  BP 128/71  Pulse 66  Temp(Src) 98.6 F (37 C) (Oral)  Resp 16  Ht 5\' 3"  (1.6 m)  Wt 167 lb 5.3 oz (75.9 kg)  BMI 29.65 kg/m2  SpO2 100%   EKG Interpretation   Date/Time:  Thursday May 25 2013 18:42:25 EDT Ventricular Rate:  60 PR Interval:  168 QRS Duration: 78 QT Interval:  476 QTC Calculation: 476 R Axis:   52 Text Interpretation:  Sinus rhythm Probable left atrial enlargement  Probable left ventricular hypertrophy No significant change was found  Confirmed by Wyvonnia Dusky  MD, Shaquana Buel [phone]) on 05/25/2013 6:46:56 PM       Ezequiel Essex, MD 05/26/13 1051

## 2013-05-26 NOTE — H&P (Signed)
[facility] History and Physical  Patient: Sharon Norris  [id number]  DOB: [birth year]  DOS: the patient was seen and examined on 05/25/2013 PCP: Pcp Not In System  Chief Complaint: Chest pain  HPI: Sharon Norris is a 58 y.o. female with Past medical history of HIV, hypertension, hepatitis C, drug abuse, anxiety, neuropathy. The patient is presenting with complaints of chest pain and chest tightness. She mentions that since last 2 days she has been having on and off chest tightness located substernally. This is also associated with left arm numbness. It has been at present throughout the day. She has bilateral lower extremity numbness secondary to her neuropathy this is associated with paresthesia tingling and numbness. She denies any acute change in that. She denies any recent fall, injury headache. She complains of horizontal diplopia. She complains of vertigo which is positional. At the time of my evaluation Pt denies any fever, chills, headache, cough, chest pain, palpitation, shortness of breath, orthopnea, PND, nausea, vomiting, abdominal pain, diarrhea, constipation, active bleeding, burning urination, dizziness, pedal edema,  focal neurological deficit.   The patient is coming from home. And at her baseline Independent for most of her  ADL.  Review of Systems: as mentioned in the history of present illness.  A Comprehensive review of the other systems is negative.  Past Medical History  Diagnosis Date  . HIV infection   . Hypertension   . Neuropathy   . Anxiety   . Depression   . Sleep difficulties   . H/O drug abuse     IV drugs, drug free 12 years   Past Surgical History  Procedure Laterality Date  . Colposcopy  2010   Social History:  reports that she quit smoking about 13 years ago. She has never used smokeless tobacco. She reports that she does not drink alcohol or use illicit drugs.  No Known Allergies  Family History  Problem Relation Age of Onset  .  Alcohol abuse Father   . Cancer Maternal Aunt     endometrial  . Cancer Sister     endometrial  . Cancer Sister     breast    Prior to Admission medications   Medication Sig Start Date End Date Taking? Authorizing Provider  abacavir-lamiVUDine (EPZICOM) 600-300 MG per tablet Take 1 tablet by mouth at bedtime. 03/20/13  Yes Campbell Riches, MD  cloNIDine (CATAPRES) 0.1 MG tablet Take 0.2 mg by mouth daily. 12/26/12  Yes Campbell Riches, MD  diphenhydramine-acetaminophen (TYLENOL PM EXTRA STRENGTH) 25-500 MG TABS Take 1 tablet by mouth at bedtime as needed.   Yes Historical Provider, MD  divalproex (DEPAKOTE ER) 500 MG 24 hr tablet TAKE 1 TABLET BY MOUTH EVERY MORNING   Yes Campbell Riches, MD  divalproex (DEPAKOTE) 250 MG DR tablet TAKE 1 TABLET BY MOUTH EVERY NIGHT AT BEDTIME   Yes Campbell Riches, MD  efavirenz (SUSTIVA) 600 MG tablet Take 1 tablet (600 mg total) by mouth at bedtime. 03/20/13  Yes Campbell Riches, MD  gabapentin (NEURONTIN) 300 MG capsule Take 1 capsule (300 mg total) by mouth 3 (three) times daily. 01/20/13  Yes Campbell Riches, MD  hydrochlorothiazide 25 MG tablet Take 25 mg by mouth daily.     Yes Historical Provider, MD  lisinopril (PRINIVIL,ZESTRIL) 40 MG tablet Take 1 tablet (40 mg total) by mouth daily. 07/27/12  Yes Campbell Riches, MD    Physical Exam: Filed Vitals:   05/25/13 2315 05/25/13 2330 05/25/13 2345 05/26/13  0000  BP: 230/95 192/74 173/71 159/67  Pulse: 63 57 61 57  Temp:      TempSrc:      Resp: 15 16 19 13   Height:      Weight:      SpO2: 99% 100% 99% 99%    General: Alert, Awake and Oriented to Time, Place and Person. Appear in mild distress Eyes: PERRL ENT: Oral Mucosa clear moist. Neck: no JVD Cardiovascular: S1 and S2 Present, no Murmur, Peripheral Pulses Present Respiratory: Bilateral Air entry equal and Decreased, Clear to Auscultation,  no Crackles,no wheezes Abdomen: Bowel Sound Present, Soft and Non tender Skin: no  Rash Extremities: no Pedal edema,  calf tenderness Neurologic: Grossly Unremarkable, bilateral lower extremity numbness.  Labs on Admission:  CBC:  Recent Labs Lab 05/25/13 1854  WBC 8.1  HGB 12.4  HCT 35.8*  MCV 101.7*  PLT 210    CMP     Component Value Date/Time   NA 140 05/25/2013 1854   K 4.9 05/25/2013 1854   CL 105 05/25/2013 1854   CO2 18* 05/25/2013 1854   GLUCOSE 100* 05/25/2013 1854   BUN 18 05/25/2013 1854   CREATININE 1.03 05/25/2013 1854   CREATININE 1.15* 10/27/2012 1701   CALCIUM 9.7 05/25/2013 1854   PROT 8.4* 05/25/2013 1854   ALBUMIN 3.4* 05/25/2013 1854   AST 71* 05/25/2013 1854   ALT 59* 05/25/2013 1854   ALKPHOS 165* 05/25/2013 1854   BILITOT 0.4 05/25/2013 1854   GFRNONAA 58* 05/25/2013 1854   GFRNONAA 49* 02/25/2011 1429   GFRAA 68* 05/25/2013 1854   GFRAA 56* 02/25/2011 1429    No results found for this basename: LIPASE, AMYLASE,  in the last 168 hours No results found for this basename: AMMONIA,  in the last 168 hours  No results found for this basename: CKTOTAL, CKMB, CKMBINDEX, TROPONINI,  in the last 168 hours BNP (last 3 results) No results found for this basename: PROBNP,  in the last 8760 hours  Radiological Exams on Admission: Dg Chest 2 View  05/25/2013   CLINICAL DATA:  Chest pain  EXAM: CHEST  2 VIEW  COMPARISON:  May 09, 2012  FINDINGS: There is no edema or consolidation. The heart size and pulmonary vascularity are normal. No adenopathy. No pneumothorax. No bone lesions.  IMPRESSION: No edema or consolidation.   Electronically Signed   By: Lowella Grip M.D.   On: 05/25/2013 21:04    EKG: Independently reviewed. normal EKG, normal sinus rhythm, unchanged from previous tracings.  Assessment/Plan Principal Problem:   Hypertensive urgency Active Problems:   HIV DISEASE   HEPATITIS C   PERIPHERAL NEUROPATHY   HYPERTENSION   Chest pain   1. Hypertensive urgency The patient presented with complaints of chest pain, diplopia, vertigo present since last  night more than 24 hours. At present her symptoms to resolve. Initially when she presented to the ED she was hypertensive with blood pressure about [id number] systolic [id number] diastolic. She was given one dose of clonidine. She claims she is compliant with her medication and has not missed any doses. She doesn't have flank pain, hematuria, EKG changes. Her troponins are negative. Her serum creatinine is stable. This the patient will be admitted for observation under stepdown unit. Serial troponin, telemetry monitoring, echocardiogram in the morning and nitroglycerin ointment for chest pain. Serial neuro checks, CT scan of the head, and PTOT consultation for diplopia and left-sided weakness. Continue clonidine, hydrochlorothiazide, lisinopril for blood pressure Added nitroglycerin ointment, and add  Norvasc but her blood pressure has dropped from 200-150 after one dose of clonidine therefore holding admission of further medications to avoid rapid fluctuations.  2. HIV disease Continue current medications. Patient follows up with Dr. Johnnye Sima and is in the process of finding a PCP for her.  3. Hepatitis C Elevated LFTs Patient has elevated LFTs but she has history of hepatitis C, further management outpatient per infectious disease monitor LFT  4. Peripheral neuropathy Stable continue gabapentin  DVT Prophylaxis: subcutaneous Heparin Nutrition: N.p.o. after midnight  Code Status: Full  Disposition: Admitted to observation in stepdown unit.  Author: Berle Mull, MD [facility] Pager: [phone] 05/26/2013, 12:14 AM    If 7PM-7AM, please contact night-coverage [email or web address] Password [email or web address]

## 2013-05-26 NOTE — Progress Notes (Signed)
Pt. Has episode of chest pain upon arrival to the floor from ED. With excruciating pain rt. Chest radiates to the left. Tender to touch rt. Chest and with slight SOB. BP is high. 12 lead EKG done, placed on O2 2l [name], morphine 2mg . IV given. Tylene Fantasia made aware with orders made. Started on nitro patch. Will cont. To assessed pain and BP.

## 2013-05-26 NOTE — Progress Notes (Signed)
Utilization Review Completed.Neoma Laming T Dowell4/11/2013

## 2013-05-26 NOTE — Discharge Summary (Signed)
Physician Discharge Summary  Sharon Norris [id number] DOB: [birth year] DOA: 05/25/2013  PCP: Pcp Not In System  Admit date: 05/25/2013 Discharge date: 05/27/2013  Time spent: 30 minutes  Recommendations for Outpatient Follow-up:  1. HIV; Follow up with [facility] as previously scheduled 2. Noncardiac chest pain; Use OTC Ibuprofen as directed and for no longer than 7 days 3. HTN; not within [facility] guidelines but fairly well-controlled will allow PCP to titrate medication to better effect. 4. CKD stage III ; stable continue to follow with PCP. Continue to avoid nephrotoxic medication     Discharge Diagnoses:  Principal Problem:   Hypertensive urgency-resolved Active Problems:   Chest pain-more c/w musculoskeletal etiology   HIV DISEASE   HYPERTENSION   CKD (chronic kidney disease), stage III   HEPATITIS C   PERIPHERAL NEUROPATHY   Discharge Condition: stable  Diet recommendation: Regular  Filed Weights   05/25/13 1939 05/26/13 0051  Weight: 78.881 kg (173 lb 14.4 oz) 75.9 kg (167 lb 5.3 oz)    History of present illness:  58 y.o. BF PMHx HIV, hypertension, hepatitis C, drug abuse, anxiety, neuropathy. She presented with complaints of chest pain and chest tightness. She mentioned that over the last 2 days she had been having on and off chest tightness located substernally. This was also associated with left arm numbness. It has been at present throughout the day. She had bilateral lower extremity numbness secondary to her neuropathy this is associated with paresthesia tingling and numbness. She denies any acute change in that.   She denied any recent fall, injury headache. She complains of horizontal diplopia. She complains of vertigo which is positional. 60 /11 patient sitting in bed eating lunch. States negative CP, negative SOB. Ready for discharge   Hospital Course:  Chest pain,atypical -exam more c/w musculoskeletal etiology: pain constant and reproducible with  palpation over right chest wall-she did endorse acute onset of the symptoms within several hours of physically picking up her grandchild prior to admission -enzymes and EKG were normal and not c/w ischemia -ECHO was -pt did endorse issues with chronic ongoing DOE and was instructed to make PCP aware if this continues - persistence of these symptoms may indicate need for OP stress testing -continue OTC Ibuprofen 400-600 (or 2-3 tabs) every 8 hrs prn for no longer than 7 days duration -no hypoxia or risk factors to suggest PE as etiology  Hypertensive urgency -patient adamantly denied non adherence to meds -discussed with her that if misses dose of Clonidine can experience rebound HTN c/w BP readings at presentation this admission -suspect pain and anxiety contributed to high BP readings  HIV -resume follow up with Johnnye Sima with ID  CKD  -Patient counseled to avoid nephrotoxic medication -Counseled to followup closely with PCP.  Peripheral Neuropathy -Continue home medication       Procedures: Echocardiogram 05/26/2013 -Left ventricle: The cavity size was normal.  -LVEF= 65% to 70%. Wall motion was normal; - there were no regional wall motion abnormalities.    Consultations:  None  Discharge Exam: Filed Vitals:   05/27/13 1230  BP: 138/74  Pulse: 54  Temp: 98.4 F (36.9 C)  Resp: 16   General: A./O. x4, NAD  Lungs: Clear to auscultation bilaterally without wheezes or crackles, RA-right anterior chest wall tenderness resolved  Cardiovascular: Regular rate and rhythm without murmur gallop or rub normal S1 and S2, no peripheral edema or JVD Abdomen: Nontender, nondistended, soft, bowel sounds positive, no rebound, no ascites, no appreciable mass  Musculoskeletal: No significant cyanosis, clubbing of bilateral lower extremities Neurological: Alert and oriented x 4, moves all extremities x 4 without focal neurological deficits, CN 2-12 intact   Discharge  Instructions You were cared for by a hospitalist during your hospital stay. If you have any questions about your discharge medications or the care you received while you were in the hospital after you are discharged, you can call the unit and asked to speak with the hospitalist on call if the hospitalist that took care of you is not available. Once you are discharged, your primary care physician will handle any further medical issues. Please note that NO REFILLS for any discharge medications will be authorized once you are discharged, as it is imperative that you return to your primary care physician (or establish a relationship with a primary care physician if you do not have one) for your aftercare needs so that they can reassess your need for medications and monitor your lab values.       Future Appointments Provider Department Dept Phone   06/05/2013 2:30 PM Merita Norton, [facility] for Infectious Disease [phone]   06/05/2013 4:15 PM Campbell Riches, MD [facility] for Infectious Disease [phone]       Medication List         abacavir-lamiVUDine 600-300 MG per tablet  Commonly known as:  EPZICOM  Take 1 tablet by mouth at bedtime.     cloNIDine 0.1 MG tablet  Commonly known as:  CATAPRES  Take 0.2 mg by mouth daily.     divalproex 500 MG 24 hr tablet  Commonly known as:  DEPAKOTE ER  TAKE 1 TABLET BY MOUTH EVERY MORNING     divalproex 250 MG DR tablet  Commonly known as:  DEPAKOTE  TAKE 1 TABLET BY MOUTH EVERY NIGHT AT BEDTIME     efavirenz 600 MG tablet  Commonly known as:  SUSTIVA  Take 1 tablet (600 mg total) by mouth at bedtime.     gabapentin 300 MG capsule  Commonly known as:  NEURONTIN  Take 1 capsule (300 mg total) by mouth 3 (three) times daily.     hydrochlorothiazide 25 MG tablet  Commonly known as:  HYDRODIURIL  Take 25 mg by mouth daily.     Ibuprofen 200 MG Caps  Take 2-3 capsules (400-600 mg total) by mouth 3  (three) times daily as needed (for chest wall pain- DO NOT TAKE FOR MORE THAN 7 DAYS IN A ROW).     lisinopril 40 MG tablet  Commonly known as:  PRINIVIL,ZESTRIL  Take 1 tablet (40 mg total) by mouth daily.     TYLENOL PM EXTRA STRENGTH 25-500 MG Tabs  Generic drug:  diphenhydramine-acetaminophen  Take 1 tablet by mouth at bedtime as needed.       No Known Allergies Follow-up Information   Follow up with Bobby Rumpf, MD. (Keep previously scheduled appointment 4/20)    Specialty:  Infectious Diseases   Contact information:   [address]  [address] [place] [address] [phone]        The results of significant diagnostics from this hospitalization (including imaging, microbiology, ancillary and laboratory) are listed below for reference.    Significant Diagnostic Studies: Dg Chest 2 View  05/25/2013   CLINICAL DATA:  Chest pain  EXAM: CHEST  2 VIEW  COMPARISON:  May 09, 2012  FINDINGS: There is no edema or consolidation. The heart size  and pulmonary vascularity are normal. No adenopathy. No pneumothorax. No bone lesions.  IMPRESSION: No edema or consolidation.   Electronically Signed   By: Lowella Grip M.D.   On: 05/25/2013 21:04   Ct Head Wo Contrast  05/26/2013   CLINICAL DATA:  Hypertension and diplopia. Intermittent chest pressure and nausea.  EXAM: CT HEAD WITHOUT CONTRAST  TECHNIQUE: Contiguous axial images were obtained from the base of the skull through the vertex without intravenous contrast.  COMPARISON:  12/29/2005  FINDINGS: Ventricles, cisterns and other CSF spaces are within normal. There is no mass, mass effect, shift of midline structures or acute hemorrhage. There is no evidence of acute infarction. Minimal left basal ganglia calcification unchanged. Remaining bones soft tissues are within normal.  IMPRESSION: No acute intracranial findings.   Electronically Signed   By: Marin Olp M.D.   On: 05/26/2013 01:21     Microbiology: Recent Results (from the past 240 hour(s))  MRSA PCR SCREENING     Status: None   Collection Time    05/26/13 12:58 AM      Result Value Ref Range Status   MRSA by PCR NEGATIVE  NEGATIVE Final   Comment:            The GeneXpert MRSA Assay (FDA     approved for NASAL specimens     only), is one component of a     comprehensive MRSA colonization     surveillance program. It is not     intended to diagnose MRSA     infection nor to guide or     monitor treatment for     MRSA infections.     Labs: Basic Metabolic Panel:  Recent Labs Lab 05/25/13 1854 05/26/13 0800  NA 140 139  K 4.9 4.2  CL 105 107  CO2 18* 16*  GLUCOSE 100* 86  BUN 18 18  CREATININE 1.03 1.07  CALCIUM 9.7 9.2   Liver Function Tests:  Recent Labs Lab 05/25/13 1854 05/26/13 0800  AST 71* 60*  ALT 59* 48*  ALKPHOS 165* 146*  BILITOT 0.4 0.5  PROT 8.4* 7.3  ALBUMIN 3.4* 2.9*   No results found for this basename: LIPASE, AMYLASE,  in the last 168 hours No results found for this basename: AMMONIA,  in the last 168 hours CBC:  Recent Labs Lab 05/25/13 1854 05/26/13 0800  WBC 8.1 6.9  NEUTROABS  --  4.2  HGB 12.4 11.0*  HCT 35.8* 31.9*  MCV 101.7* 102.2*  PLT 210 200   Cardiac Enzymes:  Recent Labs Lab 05/26/13 0325 05/26/13 0645  TROPONINI <0.30 <0.30   BNP: BNP (last 3 results) No results found for this basename: PROBNP,  in the last 8760 hours CBG: No results found for this basename: GLUCAP,  in the last 168 hours     Signed:  Allie Bossier ANP [facility] 05/27/2013, 2:25 PM

## 2013-05-26 NOTE — Discharge Instructions (Signed)
Ibuprofen tablets and capsules What is this medicine? IBUPROFEN (eye BYOO proe fen) is a non-steroidal anti-inflammatory drug (NSAID). It is used for dental pain, fever, headaches or migraines, osteoarthritis, rheumatoid arthritis, or painful monthly periods. It can also relieve minor aches and pains caused by a cold, flu, or sore throat. This medicine may be used for other purposes; ask your health care provider or pharmacist if you have questions. COMMON BRAND NAME(S): Advil Junior Strength, Advil Migraine, Advil, Genpril , Ibren , IBU, Midol Cramps and Body Aches, Midol, Motrin IB, Motrin Junior Strength, Motrin Migraine Pain, Motrin, Samson-8 What should I tell my health care provider before I take this medicine? They need to know if you have any of these conditions: -asthma -cigarette smoker -drink more than 3 alcohol containing drinks a day -heart disease or circulation problems such as heart failure or leg edema (fluid retention) -high blood pressure -kidney disease -liver disease -stomach bleeding or ulcers -an unusual or allergic reaction to ibuprofen, aspirin, other NSAIDS, other medicines, foods, dyes, or preservatives -pregnant or trying to get pregnant -breast-feeding How should I use this medicine? Take this medicine by mouth with a glass of water. Follow the directions on the prescription label. Take this medicine with food if your stomach gets upset. Try to not lie down for at least 10 minutes after you take the medicine. Take your medicine at regular intervals. Do not take your medicine more often than directed. A special MedGuide will be given to you by the pharmacist with each prescription and refill. Be sure to read this information carefully each time. Talk to your pediatrician regarding the use of this medicine in children. Special care may be needed. Overdosage: If you think you have taken too much of this medicine contact a poison control center or emergency room at  once. NOTE: This medicine is only for you. Do not share this medicine with others. What if I miss a dose? If you miss a dose, take it as soon as you can. If it is almost time for your next dose, take only that dose. Do not take double or extra doses. What may interact with this medicine? Do not take this medicine with any of the following medications: -cidofovir -ketorolac -methotrexate -pemetrexed This medicine may also interact with the following medications: -alcohol -aspirin -diuretics -lithium -other drugs for inflammation like prednisone -warfarin This list may not describe all possible interactions. Give your health care provider a list of all the medicines, herbs, non-prescription drugs, or dietary supplements you use. Also tell them if you smoke, drink alcohol, or use illegal drugs. Some items may interact with your medicine. What should I watch for while using this medicine? Tell your doctor or healthcare professional if your symptoms do not start to get better or if they get worse. This medicine does not prevent heart attack or stroke. In fact, this medicine may increase the chance of a heart attack or stroke. The chance may increase with longer use of this medicine and in people who have heart disease. If you take aspirin to prevent heart attack or stroke, talk with your doctor or health care professional. Do not take other medicines that contain aspirin, ibuprofen, or naproxen with this medicine. Side effects such as stomach upset, nausea, or ulcers may be more likely to occur. Many medicines available without a prescription should not be taken with this medicine. This medicine can cause ulcers and bleeding in the stomach and intestines at any time during treatment. Ulcers and  bleeding can happen without warning symptoms and can cause death. To reduce your risk, do not smoke cigarettes or drink alcohol while you are taking this medicine. You may get drowsy or dizzy. Do not drive,  use machinery, or do anything that needs mental alertness until you know how this medicine affects you. Do not stand or sit up quickly, especially if you are an older patient. This reduces the risk of dizzy or fainting spells. This medicine can cause you to bleed more easily. Try to avoid damage to your teeth and gums when you brush or floss your teeth. This medicine may be used to treat migraines. If you take migraine medicines for 10 or more days a month, your migraines may get worse. Keep a diary of headache days and medicine use. Contact your healthcare professional if your migraine attacks occur more frequently. What side effects may I notice from receiving this medicine? Side effects that you should report to your doctor or health care professional as soon as possible: -allergic reactions like skin rash, itching or hives, swelling of the face, lips, or tongue -severe stomach pain -signs and symptoms of bleeding such as bloody or black, tarry stools; red or dark-brown urine; spitting up blood or brown material that looks like coffee grounds; red spots on the skin; unusual bruising or bleeding from the eye, gums, or nose -signs and symptoms of a blood clot such as changes in vision; chest pain; severe, sudden headache; trouble speaking; sudden numbness or weakness of the face, arm, or leg -unexplained weight gain or swelling -unusually weak or tired -yellowing of eyes or skin  Side effects that usually do not require medical attention (report to your doctor or health care professional if they continue or are bothersome): -bruising -diarrhea -dizziness, drowsiness -headache -nausea, vomiting This list may not describe all possible side effects. Call your doctor for medical advice about side effects. You may report side effects to FDA at 1-800-FDA-1088. Where should I keep my medicine? Keep out of the reach of children. Store at room temperature between 15 and 30 degrees C (59 and 86 degrees  F). Keep container tightly closed. Throw away any unused medicine after the expiration date. NOTE: This sheet is a summary. It may not cover all possible information. If you have questions about this medicine, talk to your doctor, pharmacist, or health care provider.  2014, Elsevier/Gold Standard. (2012-10-04 10:48:02) Hypertension Hypertension is another name for high blood pressure. High blood pressure may mean that your heart needs to work harder to pump blood. Blood pressure consists of two numbers, which includes a higher number over a lower number (example: 110/72). HOME CARE   Make lifestyle changes as told by your doctor. This may include weight loss and exercise.  Take your blood pressure medicine every day.  Limit how much salt you use.  Stop smoking if you smoke.  Do not use drugs.  Talk to your doctor if you are using decongestants or birth control pills. These medicines might make blood pressure higher.  Females should not drink more than 1 alcoholic drink per day. Males should not drink more than 2 alcoholic drinks per day.  See your doctor as told. GET HELP RIGHT AWAY IF:   You have a blood pressure reading with a top number of 180 or higher.  You get a very bad headache.  You get blurred or changing vision.  You feel confused.  You feel weak, numb, or faint.  You get chest or belly (  abdominal) pain.  You throw up (vomit).  You cannot breathe very well. MAKE SURE YOU:   Understand these instructions.  Will watch your condition.  Will get help right away if you are not doing well or get worse. Document Released: 07/22/2007 Document Revised: 04/27/2011 Document Reviewed: 07/22/2007 [facility] Patient Information 2014 [place], [place]. Chest Wall Pain Chest wall pain is pain felt in or around the chest bones and muscles. It may take up to 6 weeks to get better. It may take longer if you are active. Chest wall pain can happen on its own. Other times, things  like germs, injury, coughing, or exercise can cause the pain. HOME CARE   Avoid activities that make you tired or cause pain. Try not to use your chest, belly (abdominal), or side muscles. Do not use heavy weights.  Put ice on the sore area.  Put ice in a plastic bag.  Place a towel between your skin and the bag.  Leave the ice on for 15-20 minutes for the first 2 days.  Only take medicine as told by your doctor. GET HELP RIGHT AWAY IF:   You have more pain or are very uncomfortable.  You have a fever.  Your chest pain gets worse.  You have new problems.  You feel sick to your stomach (nauseous) or throw up (vomit).  You start to sweat or feel lightheaded.  You have a cough with mucus (phlegm).  You cough up blood. MAKE SURE YOU:   Understand these instructions.  Will watch your condition.  Will get help right away if you are not doing well or get worse. Document Released: 07/22/2007 Document Revised: 04/27/2011 Document Reviewed: 09/29/2010 [facility] Patient Information 2014 [place], [place].

## 2013-05-26 NOTE — Progress Notes (Addendum)
[facility] PROGRESS NOTE  Sharon Norris [id number] DOB: 08-28-1955 DOA: 05/25/2013 PCP: Pcp Not In System  Assessment/Plan: Principal Problem:  Hypertensive urgency-resolved  Active Problems:  Chest pain-more c/w musculoskeletal etiology  HIV DISEASE  HYPERTENSION  CKD (chronic kidney disease), stage III  HEPATITIS C  PERIPHERAL NEUROPATHY  Brief summary  59 y.o. female with Past medical history of HIV, hypertension, hepatitis C, drug abuse, anxiety, neuropathy presented with chest pain and chest tightness.   Assessment   1. Chest pain,likely atypical   -exam more c/w musculoskeletal etiology: pain constant and reproducible with palpation over right chest wall-she did endorse acute onset of the symptoms within several hours of physically picking up her grandchild prior to admission  -enzymes and EKG were normal and not c/w ischemia; CXR: No edema or consolidation  -ECHO pend   -pt did endorse issues with chronic ongoing DOE and was instructed to make PCP aware if this continues - persistence of these symptoms may indicate need for OP stress testing  -continue OTC Ibuprofen 400-600 (or 2-3 tabs) every 8 hrs prn for no longer than 7 days duration  -no hypoxia or risk factors to suggest PE as etiology  2. Hypertensive urgency  -patient adamantly denied non adherence to meds  -discussed with her that if misses dose of Clonidine can experience rebound HTN c/w BP readings at presentation this admission  -suspect pain and anxiety contributed to high BP readings  3. HIV  -resume follow up with Johnnye Sima with ID  Plan awaiting echo results; possible d/c home today if echo WNL  Code Status: full Family Communication: d/w patient (indicate person spoken with, relationship, and if by phone, the number) Disposition Plan: home 24-48 hours    Consultants:  None   Procedures:  Pend echo   Antibiotics:  None  (indicate start date, and stop date if  known)  HPI/Subjective: alert  Objective: Filed Vitals:   05/26/13 1600  BP: 106/61  Pulse:   Temp: 98.4 F (36.9 C)  Resp:     Intake/Output Summary (Last 24 hours) at 05/26/13 1714 Last data filed at 05/26/13 0600  Gross per 24 hour  Intake      0 ml  Output    750 ml  Net   -750 ml   Filed Weights   05/25/13 1939 05/26/13 0051  Weight: 78.881 kg (173 lb 14.4 oz) 75.9 kg (167 lb 5.3 oz)    Exam:   General:  alert  Cardiovascular: s1,s2 rrr  Respiratory: CTA BL  Abdomen: soft,ntmnd   Musculoskeletal: no LE edema   Data Reviewed: Basic Metabolic Panel:  Recent Labs Lab 05/25/13 1854 05/26/13 0800  NA 140 139  K 4.9 4.2  CL 105 107  CO2 18* 16*  GLUCOSE 100* 86  BUN 18 18  CREATININE 1.03 1.07  CALCIUM 9.7 9.2   Liver Function Tests:  Recent Labs Lab 05/25/13 1854 05/26/13 0800  AST 71* 60*  ALT 59* 48*  ALKPHOS 165* 146*  BILITOT 0.4 0.5  PROT 8.4* 7.3  ALBUMIN 3.4* 2.9*   No results found for this basename: LIPASE, AMYLASE,  in the last 168 hours No results found for this basename: AMMONIA,  in the last 168 hours CBC:  Recent Labs Lab 05/25/13 1854 05/26/13 0800  WBC 8.1 6.9  NEUTROABS  --  4.2  HGB 12.4 11.0*  HCT 35.8* 31.9*  MCV 101.7* 102.2*  PLT 210 200   Cardiac Enzymes:  Recent Labs Lab 05/26/13 0325 05/26/13 0645  TROPONINI <0.30 <0.30   BNP (last 3 results) No results found for this basename: PROBNP,  in the last 8760 hours CBG: No results found for this basename: GLUCAP,  in the last 168 hours  Recent Results (from the past 240 hour(s))  MRSA PCR SCREENING     Status: None   Collection Time    05/26/13 12:58 AM      Result Value Ref Range Status   MRSA by PCR NEGATIVE  NEGATIVE Final   Comment:            The GeneXpert MRSA Assay (FDA     approved for NASAL specimens     only), is one component of a     comprehensive MRSA colonization     surveillance program. It is not     intended to diagnose  MRSA     infection nor to guide or     monitor treatment for     MRSA infections.     Studies: Dg Chest 2 View  05/25/2013   CLINICAL DATA:  Chest pain  EXAM: CHEST  2 VIEW  COMPARISON:  May 09, 2012  FINDINGS: There is no edema or consolidation. The heart size and pulmonary vascularity are normal. No adenopathy. No pneumothorax. No bone lesions.  IMPRESSION: No edema or consolidation.   Electronically Signed   By: Lowella Grip M.D.   On: 05/25/2013 21:04   Ct Head Wo Contrast  05/26/2013   CLINICAL DATA:  Hypertension and diplopia. Intermittent chest pressure and nausea.  EXAM: CT HEAD WITHOUT CONTRAST  TECHNIQUE: Contiguous axial images were obtained from the base of the skull through the vertex without intravenous contrast.  COMPARISON:  12/29/2005  FINDINGS: Ventricles, cisterns and other CSF spaces are within normal. There is no mass, mass effect, shift of midline structures or acute hemorrhage. There is no evidence of acute infarction. Minimal left basal ganglia calcification unchanged. Remaining bones soft tissues are within normal.  IMPRESSION: No acute intracranial findings.   Electronically Signed   By: Marin Olp M.D.   On: 05/26/2013 01:21    Scheduled Meds: . abacavir  600 mg Oral QHS   And  . lamiVUDine  300 mg Oral QHS  . aspirin EC  81 mg Oral Daily  . cloNIDine  0.1 mg Oral BID  . divalproex  500 mg Oral q morning - 10a  . divalproex  250 mg Oral QPM  . efavirenz  600 mg Oral QHS  . enoxaparin (LOVENOX) injection  40 mg Subcutaneous Q24H  . gabapentin  300 mg Oral TID  . hydrochlorothiazide  25 mg Oral Daily  . lisinopril  40 mg Oral Daily  . nitroGLYCERIN  0.5 inch Topical 4 times per day   Continuous Infusions:   Principal Problem:   Hypertensive urgency Active Problems:   HIV DISEASE   HEPATITIS C   PERIPHERAL NEUROPATHY   HYPERTENSION   Chest pain   CKD (chronic kidney disease), stage III    Time spent: >35 minutes     Kinnie Feil  [facility] Pager [phone]. If 7PM-7AM, please contact night-coverage at [email or web address], password [facility] 05/26/2013, 5:14 PM  LOS: 1 day

## 2013-05-26 NOTE — Care Management Note (Signed)
    Page 1 of 1   05/26/2013     10:18:56 AM   CARE MANAGEMENT NOTE 05/26/2013  Patient:  Sharon Norris, Sharon Norris   Account Number:  [id number]  Date Initiated:  05/26/2013  Documentation initiated by:  Elissa Hefty  Subjective/Objective Assessment:   adm w htn     Action/Plan:   pt lives w [facility]. her pcp is dr hatcher w [facility].   Anticipated DC Date:     Anticipated DC Plan:  HOME/SELF CARE      DC Planning Services  CM consult      Choice offered to / List presented to:             Status of service:   Medicare Important Message given?   (If response is "NO", the following Medicare IM given date fields will be blank) Date Medicare IM given:   Date Additional Medicare IM given:    Discharge Disposition:  HOME/SELF CARE  Per UR Regulation:  Reviewed for med. necessity/level of care/duration of stay  If discussed at [place] of Stay Meetings, dates discussed:    Comments:  4/10 1017 debbie Tevis Dunavan rn,bsn spoke w pt. she goes to dr hatcher at [facility]. that is her prim. she gets meds from [facility]. did give pt 2 prescripton discount cards and [employer].

## 2013-05-26 NOTE — Progress Notes (Signed)
  Echocardiogram 2D Echocardiogram has been performed.  Sharon Norris 05/26/2013, 5:24 PM

## 2013-05-27 DIAGNOSIS — N289 Disorder of kidney and ureter, unspecified: Secondary | ICD-10-CM

## 2013-05-27 DIAGNOSIS — G609 Hereditary and idiopathic neuropathy, unspecified: Secondary | ICD-10-CM

## 2013-05-27 DIAGNOSIS — N183 Chronic kidney disease, stage 3 unspecified: Secondary | ICD-10-CM

## 2013-05-27 NOTE — Progress Notes (Signed)
D/c instructions reviewed w/Pt . Pt voiced understanding.

## 2013-05-27 NOTE — Progress Notes (Signed)
Utilization Review Completed.  

## 2013-05-31 ENCOUNTER — Other Ambulatory Visit: Payer: Self-pay | Admitting: Infectious Diseases

## 2013-06-05 ENCOUNTER — Ambulatory Visit: Payer: Self-pay

## 2013-06-05 ENCOUNTER — Ambulatory Visit: Payer: Self-pay | Admitting: Infectious Diseases

## 2013-06-13 ENCOUNTER — Telehealth: Payer: Self-pay | Admitting: *Deleted

## 2013-06-13 NOTE — Telephone Encounter (Signed)
Patient notified of he appt at IM clinic for 06/26/13 at 10:45 AM. Advised her to please keep appt or notify the clinic at least the day before if she needs to reschedule. Sharon Norris

## 2013-06-26 ENCOUNTER — Ambulatory Visit: Payer: Self-pay | Admitting: Internal Medicine

## 2013-06-26 ENCOUNTER — Telehealth: Payer: Self-pay | Admitting: *Deleted

## 2013-06-26 NOTE — Telephone Encounter (Signed)
Notified by Tamela Oddi at Internal Medicine that patient did not keep her appt to establish with PCP  Today. Sharon Norris

## 2013-06-27 ENCOUNTER — Telehealth: Payer: Self-pay | Admitting: *Deleted

## 2013-06-27 ENCOUNTER — Other Ambulatory Visit: Payer: Self-pay | Admitting: Infectious Diseases

## 2013-06-27 NOTE — Telephone Encounter (Signed)
thanks

## 2013-06-27 NOTE — Telephone Encounter (Signed)
Attempted to call patient for an appointment, phone is not in service at this time.  Pt requesting refills, allowed 1 month, stating that she needs to be seen for additional refills. Landis Gandy, RN

## 2013-06-29 ENCOUNTER — Other Ambulatory Visit: Payer: Self-pay | Admitting: Licensed Clinical Social Worker

## 2013-06-29 DIAGNOSIS — I10 Essential (primary) hypertension: Secondary | ICD-10-CM

## 2013-06-29 MED ORDER — CLONIDINE HCL 0.1 MG PO TABS: 0.2000 mg | ORAL_TABLET | Freq: Every day | ORAL | Status: DC

## 2013-08-01 ENCOUNTER — Other Ambulatory Visit: Payer: Self-pay | Admitting: Infectious Diseases

## 2013-09-05 ENCOUNTER — Ambulatory Visit (INDEPENDENT_AMBULATORY_CARE_PROVIDER_SITE_OTHER): Payer: Medicaid Other | Admitting: Internal Medicine

## 2013-09-05 ENCOUNTER — Encounter: Payer: Self-pay | Admitting: Internal Medicine

## 2013-09-05 ENCOUNTER — Other Ambulatory Visit: Payer: Medicaid Other

## 2013-09-05 ENCOUNTER — Ambulatory Visit (HOSPITAL_COMMUNITY)
Admission: RE | Admit: 2013-09-05 | Discharge: 2013-09-05 | Disposition: A | Discharge status: Home / Self Care | Payer: Medicaid Other | Source: Ambulatory Visit | Attending: Internal Medicine | Admitting: Internal Medicine

## 2013-09-05 VITALS — BP 167/97 | HR 81 | Temp 97.1°F | Ht 63.0 in | Wt 172.6 lb

## 2013-09-05 DIAGNOSIS — F99 Mental disorder, not otherwise specified: Secondary | ICD-10-CM

## 2013-09-05 DIAGNOSIS — Z0000 Encounter for general adult medical examination without abnormal findings: Secondary | ICD-10-CM | POA: Insufficient documentation

## 2013-09-05 DIAGNOSIS — R9431 Abnormal electrocardiogram [ECG] [EKG]: Secondary | ICD-10-CM | POA: Diagnosis not present

## 2013-09-05 DIAGNOSIS — R079 Chest pain, unspecified: Secondary | ICD-10-CM

## 2013-09-05 DIAGNOSIS — G4700 Insomnia, unspecified: Secondary | ICD-10-CM

## 2013-09-05 DIAGNOSIS — K219 Gastro-esophageal reflux disease without esophagitis: Secondary | ICD-10-CM | POA: Insufficient documentation

## 2013-09-05 DIAGNOSIS — I10 Essential (primary) hypertension: Secondary | ICD-10-CM

## 2013-09-05 DIAGNOSIS — B20 Human immunodeficiency virus [HIV] disease: Secondary | ICD-10-CM

## 2013-09-05 DIAGNOSIS — G609 Hereditary and idiopathic neuropathy, unspecified: Secondary | ICD-10-CM

## 2013-09-05 DIAGNOSIS — M549 Dorsalgia, unspecified: Secondary | ICD-10-CM

## 2013-09-05 DIAGNOSIS — R0789 Other chest pain: Secondary | ICD-10-CM

## 2013-09-05 DIAGNOSIS — B1710 Acute hepatitis C without hepatic coma: Secondary | ICD-10-CM

## 2013-09-05 DIAGNOSIS — F489 Nonpsychotic mental disorder, unspecified: Secondary | ICD-10-CM

## 2013-09-05 LAB — CBC WITH DIFFERENTIAL/PLATELET
BASOS ABS: 0.1 10*3/uL (ref 0.0–0.1)
Basophils Relative: 1 % (ref 0–1)
EOS ABS: 0.2 10*3/uL (ref 0.0–0.7)
EOS PCT: 2 % (ref 0–5)
HEMATOCRIT: 38.2 % (ref 36.0–46.0)
Hemoglobin: 13.2 g/dL (ref 12.0–15.0)
LYMPHS PCT: 29 % (ref 12–46)
Lymphs Abs: 2.2 10*3/uL (ref 0.7–4.0)
MCH: 33.6 pg (ref 26.0–34.0)
MCHC: 34.6 g/dL (ref 30.0–36.0)
MCV: 97.2 fL (ref 78.0–100.0)
MONO ABS: 0.5 10*3/uL (ref 0.1–1.0)
Monocytes Relative: 7 % (ref 3–12)
Neutro Abs: 4.7 10*3/uL (ref 1.7–7.7)
Neutrophils Relative %: 61 % (ref 43–77)
PLATELETS: 255 10*3/uL (ref 150–400)
RBC: 3.93 MIL/uL (ref 3.87–5.11)
RDW: 13 % (ref 11.5–15.5)
WBC: 7.7 10*3/uL (ref 4.0–10.5)

## 2013-09-05 MED ORDER — HYDROCHLOROTHIAZIDE 25 MG PO TABS: 25.0000 mg | ORAL_TABLET | Freq: Every day | ORAL | Status: DC

## 2013-09-05 MED ORDER — TRAMADOL HCL 50 MG PO TABS: 50.0000 mg | ORAL_TABLET | Freq: Three times a day (TID) | ORAL | Status: DC | PRN

## 2013-09-05 MED ORDER — CYCLOBENZAPRINE HCL 5 MG PO TABS: 5.0000 mg | ORAL_TABLET | Freq: Three times a day (TID) | ORAL | Status: DC | PRN

## 2013-09-05 MED ORDER — CLONIDINE HCL 0.1 MG PO TABS: 0.2000 mg | ORAL_TABLET | Freq: Every day | ORAL | Status: DC

## 2013-09-05 MED ORDER — TEMAZEPAM 15 MG PO CAPS: 15.0000 mg | ORAL_CAPSULE | Freq: Every evening | ORAL | Status: DC | PRN

## 2013-09-05 MED ORDER — LISINOPRIL 40 MG PO TABS: 40.0000 mg | ORAL_TABLET | Freq: Every day | ORAL | Status: DC

## 2013-09-05 MED ORDER — OMEPRAZOLE 40 MG PO CPDR: 40.0000 mg | DELAYED_RELEASE_CAPSULE | Freq: Every day | ORAL | Status: DC

## 2013-09-05 MED ORDER — GABAPENTIN 300 MG PO CAPS: ORAL_CAPSULE | ORAL | Status: DC

## 2013-09-05 NOTE — Assessment & Plan Note (Signed)
Chest pain seems atypical (i.e MSK due to reproducibility) but w/o r/o ACS EKG today NSR w/o ST/T changes, trop pending Will try Priolec 40 mg qd, will see if tramadol helps, NSAIDS ideal if MSK pain but pt has CKD so avoid in excess  Will likely benefit in the future from referral to cardiology for NST

## 2013-09-05 NOTE — Assessment & Plan Note (Signed)
Referred for mammogram and colonoscopy today

## 2013-09-05 NOTE — Patient Instructions (Addendum)
General Instructions: Please take all medications as instructed and pick up from the pharmacy Follow up with Dr. Johnnye Sima in August 2015 as scheduled  Return to clinic in 1 blood pressure check and to follow up for other problems (back pain, chest pain) Take care    Treatment Goals:  Goals (1 Years of Data) as of 09/05/13         As of Today 05/27/13 05/27/13 05/27/13 05/27/13     Blood Pressure    . Blood Pressure < 140/90  167/97 138/74 169/84 102/77 102/44      Progress Toward Treatment Goals:  Treatment Goal 09/05/2013  Blood pressure deteriorated    Self Care Goals & Plans:  Self Care Goal 09/05/2013  Manage my medications take my medicines as prescribed; bring my medications to every visit; refill my medications on time; follow the sick day instructions if I am sick  Monitor my health keep track of my blood glucose; keep track of my blood pressure; keep track of my weight  Eat healthy foods eat more vegetables; eat fruit for snacks and desserts; eat foods that are low in salt; eat baked foods instead of fried foods; eat smaller portions; drink diet soda or water instead of juice or soda  Be physically active find an activity I enjoy  Meeting treatment goals maintain the current self-care plan    No flowsheet data found.   Care Management & Community Referrals:  Referral 09/05/2013  Referrals made to community resources none     Chest Pain (Nonspecific) It is often hard to give a specific diagnosis for the cause of chest pain. There is always a chance that your pain could be related to something serious, such as a heart attack or a blood clot in the lungs. You need to follow up with your health care provider for further evaluation. CAUSES   Heartburn.  Pneumonia or bronchitis.  Anxiety or stress.  Inflammation around your heart (pericarditis) or lung (pleuritis or pleurisy).  A blood clot in the lung.  A collapsed lung (pneumothorax). It can develop suddenly on its  own (spontaneous pneumothorax) or from trauma to the chest.  Shingles infection (herpes zoster virus). The chest wall is composed of bones, muscles, and cartilage. Any of these can be the source of the pain.  The bones can be bruised by injury.  The muscles or cartilage can be strained by coughing or overwork.  The cartilage can be affected by inflammation and become sore (costochondritis). DIAGNOSIS  Lab tests or other studies may be needed to find the cause of your pain. Your health care provider may have you take a test called an ambulatory electrocardiogram (ECG). An ECG records your heartbeat patterns over a 24-hour period. You may also have other tests, such as:  Transthoracic echocardiogram (TTE). During echocardiography, sound waves are used to evaluate how blood flows through your heart.  Transesophageal echocardiogram (TEE).  Cardiac monitoring. This allows your health care provider to monitor your heart rate and rhythm in real time.  Holter monitor. This is a portable device that records your heartbeat and can help diagnose heart arrhythmias. It allows your health care provider to track your heart activity for several days, if needed.  Stress tests by exercise or by giving medicine that makes the heart beat faster. TREATMENT   Treatment depends on what may be causing your chest pain. Treatment may include:  Acid blockers for heartburn.  Anti-inflammatory medicine.  Pain medicine for inflammatory conditions.  Antibiotics if  an infection is present.  You may be advised to change lifestyle habits. This includes stopping smoking and avoiding alcohol, caffeine, and chocolate.  You may be advised to keep your head raised (elevated) when sleeping. This reduces the chance of acid going backward from your stomach into your esophagus. Most of the time, nonspecific chest pain will improve within 2-3 days with rest and mild pain medicine.  HOME CARE INSTRUCTIONS   If  antibiotics were prescribed, take them as directed. Finish them even if you start to feel better.  For the next few days, avoid physical activities that bring on chest pain. Continue physical activities as directed.  Do not use any tobacco products, including cigarettes, chewing tobacco, or electronic cigarettes.  Avoid drinking alcohol.  Only take medicine as directed by your health care provider.  Follow your health care provider's suggestions for further testing if your chest pain does not go away.  Keep any follow-up appointments you made. If you do not go to an appointment, you could develop lasting (chronic) problems with pain. If there is any problem keeping an appointment, call to reschedule. SEEK MEDICAL CARE IF:   Your chest pain does not go away, even after treatment.  You have a rash with blisters on your chest.  You have a fever. SEEK IMMEDIATE MEDICAL CARE IF:   You have increased chest pain or pain that spreads to your arm, neck, jaw, back, or abdomen.  You have shortness of breath.  You have an increasing cough, or you cough up blood.  You have severe back or abdominal pain.  You feel nauseous or vomit.  You have severe weakness.  You faint.  You have chills. This is an emergency. Do not wait to see if the pain will go away. Get medical help at once. Call your local emergency services (911 in [place] Do not drive yourself to the hospital. MAKE SURE YOU:   Understand these instructions.  Will watch your condition.  Will get help right away if you are not doing well or get worse. Document Released: 11/12/2004 Document Revised: 02/07/2013 Document Reviewed: 09/08/2007 [facility] Patient Information 2015 [place], [place]. This information is not intended to replace advice given to you by your health care provider. Make sure you discuss any questions you have with your health care provider.   Exercise to Lose Weight Exercise and a healthy diet may help you  lose weight. Your doctor may suggest specific exercises. EXERCISE IDEAS AND TIPS  Choose low-cost things you enjoy doing, such as walking, bicycling, or exercising to workout videos.  Take stairs instead of the elevator.  Walk during your lunch break.  Park your car further away from work or school.  Go to a gym or an exercise class.  Start with 5 to 10 minutes of exercise each day. Build up to 30 minutes of exercise 4 to 6 days a week.  Wear shoes with good support and comfortable clothes.  Stretch before and after working out.  Work out until you breathe harder and your heart beats faster.  Drink extra water when you exercise.  Do not do so much that you hurt yourself, feel dizzy, or get very short of breath. Exercises that burn about 150 calories:  Running 1  miles in 15 minutes.  Playing volleyball for 45 to 60 minutes.  Washing and waxing a car for 45 to 60 minutes.  Playing touch football for 45 minutes.  Walking 1  miles in 35 minutes.  Pushing a  stroller 1  miles in 30 minutes.  Playing basketball for 30 minutes.  Raking leaves for 30 minutes.  Bicycling 5 miles in 30 minutes.  Walking 2 miles in 30 minutes.  Dancing for 30 minutes.  Shoveling snow for 15 minutes.  Swimming laps for 20 minutes.  Walking up stairs for 15 minutes.  Bicycling 4 miles in 15 minutes.  Gardening for 30 to 45 minutes.  Jumping rope for 15 minutes.  Washing windows or floors for 45 to 60 minutes. Document Released: 03/07/2010 Document Revised: 04/27/2011 Document Reviewed: 03/07/2010 [facility] Patient Information 2015 [place], [place]. This information is not intended to replace advice given to you by your health care provider. Make sure you discuss any questions you have with your health care provider.  Hypertension Hypertension, commonly called high blood pressure, is when the force of blood pumping through your arteries is too strong. Your arteries are the blood  vessels that carry blood from your heart throughout your body. A blood pressure reading consists of a higher number over a lower number, such as 110/72. The higher number (systolic) is the pressure inside your arteries when your heart pumps. The lower number (diastolic) is the pressure inside your arteries when your heart relaxes. Ideally you want your blood pressure below 120/80. Hypertension forces your heart to work harder to pump blood. Your arteries may become narrow or stiff. Having hypertension puts you at risk for heart disease, stroke, and other problems.  RISK FACTORS Some risk factors for high blood pressure are controllable. Others are not.  Risk factors you cannot control include:   Race. You may be at higher risk if you are African American.  Age. Risk increases with age.  Gender. Men are at higher risk than women before age 60 years. After age 53, women are at higher risk than men. Risk factors you can control include:  Not getting enough exercise or physical activity.  Being overweight.  Getting too much fat, sugar, calories, or salt in your diet.  Drinking too much alcohol. SIGNS AND SYMPTOMS Hypertension does not usually cause signs or symptoms. Extremely high blood pressure (hypertensive crisis) may cause headache, anxiety, shortness of breath, and nosebleed. DIAGNOSIS  To check if you have hypertension, your health care provider will measure your blood pressure while you are seated, with your arm held at the level of your heart. It should be measured at least twice using the same arm. Certain conditions can cause a difference in blood pressure between your right and left arms. A blood pressure reading that is higher than normal on one occasion does not mean that you need treatment. If one blood pressure reading is high, ask your health care provider about having it checked again. TREATMENT  Treating high blood pressure includes making lifestyle changes and possibly taking  medication. Living a healthy lifestyle can help lower high blood pressure. You may need to change some of your habits. Lifestyle changes may include:  Following the DASH diet. This diet is high in fruits, vegetables, and whole grains. It is low in salt, red meat, and added sugars.  Getting at least 2 1/2 hours of brisk physical activity every week.  Losing weight if necessary.  Not smoking.  Limiting alcoholic beverages.  Learning ways to reduce stress. If lifestyle changes are not enough to get your blood pressure under control, your health care provider may prescribe medicine. You may need to take more than one. Work closely with your health care provider to understand the  risks and benefits. HOME CARE INSTRUCTIONS  Have your blood pressure rechecked as directed by your health care provider.   Only take medicine as directed by your health care provider. Follow the directions carefully. Blood pressure medicines must be taken as prescribed. The medicine does not work as well when you skip doses. Skipping doses also puts you at risk for problems.   Do not smoke.   Monitor your blood pressure at home as directed by your health care provider. SEEK MEDICAL CARE IF:   You think you are having a reaction to medicines taken.  You have recurrent headaches or feel dizzy.  You have swelling in your ankles.  You have trouble with your vision. SEEK IMMEDIATE MEDICAL CARE IF:  You develop a severe headache or confusion.  You have unusual weakness, numbness, or feel faint.  You have severe chest or abdominal pain.  You vomit repeatedly.  You have trouble breathing. MAKE SURE YOU:   Understand these instructions.  Will watch your condition.  Will get help right away if you are not doing well or get worse. Document Released: 02/02/2005 Document Revised: 02/07/2013 Document Reviewed: 11/25/2012 [facility] Patient Information 2015 [place], [place]. This information is not intended  to replace advice given to you by your health care provider. Make sure you discuss any questions you have with your health care provider.

## 2013-09-05 NOTE — Assessment & Plan Note (Signed)
Rx refill of Neurontin 300 mg bid

## 2013-09-05 NOTE — Progress Notes (Signed)
Rx for tramadol and generic Restoril called in to pharmacy per Dr Loma Sousa. Normon Pettijohn RN 3:35PM 09/05/13.

## 2013-09-05 NOTE — Assessment & Plan Note (Signed)
Patient interested in treatment Will disc with ID MD Dr. Johnnye Sima to see if [facility] treats or if will need referral (i.e [facility]) Also has never had [place] to look for cirrhosis per epic review

## 2013-09-05 NOTE — Assessment & Plan Note (Signed)
Referred back to [facility] for treatment given info

## 2013-09-05 NOTE — Assessment & Plan Note (Signed)
BP Readings from Last 3 Encounters:  09/05/13 167/97  05/27/13 138/74  11/21/12 152/87    Lab Results  Component Value Date   NA 139 05/26/2013   K 4.2 05/26/2013   CREATININE 1.07 05/26/2013    Assessment: Blood pressure control: moderately elevated Progress toward BP goal:  deteriorated Comments: uncontrolled pt out of medications   Plan: Medications:  continue current medications (clonidine 0.2 mg qd, HCTZ 25 mg qd, Lisinopril 40 mg qd) refilled  Educational resources provided: brochure;handout;video Self management tools provided: other (see comments) Other plans: pending CMET, f/u in 1 month to check BP

## 2013-09-05 NOTE — Assessment & Plan Note (Signed)
Will Rx Prilosec 40 mg qd

## 2013-09-05 NOTE — Assessment & Plan Note (Signed)
Pending CMET today  Disc'ed avoidance of excessive use of NSAIDs
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
## 2013-09-05 NOTE — Assessment & Plan Note (Signed)
Rx refill of Restoril 15 mg qhs

## 2013-09-05 NOTE — Assessment & Plan Note (Signed)
2013 lumbar MRI with degenerative changes L4-S1 likely causing radiculopathy disc'ed in the future referral to PT consider  Will try Flexeril 5 mg tid prn, Tramadol 50 mg tid prn. Avoid excess Ibuprofen with CKD history

## 2013-09-05 NOTE — Addendum Note (Signed)
Addended by: Lorne Skeens D on: 09/05/2013 05:08 PM   Modules accepted: Orders

## 2013-09-05 NOTE — Assessment & Plan Note (Signed)
Controlled as of 10/2012 Pending labs from today for ID Will f/u with ID 09/2013  Needs Rx refills HIV meds will CC Dr. Johnnye Sima

## 2013-09-06 LAB — COMPLETE METABOLIC PANEL WITH GFR
ALT: 38 U/L — ABNORMAL HIGH (ref 0–35)
AST: 55 U/L — ABNORMAL HIGH (ref 0–37)
Albumin: 3.8 g/dL (ref 3.5–5.2)
Alkaline Phosphatase: 170 U/L — ABNORMAL HIGH (ref 39–117)
BILIRUBIN TOTAL: 0.6 mg/dL (ref 0.2–1.2)
BUN: 22 mg/dL (ref 6–23)
CO2: 18 mEq/L — ABNORMAL LOW (ref 19–32)
CREATININE: 1.13 mg/dL — AB (ref 0.50–1.10)
Calcium: 9.5 mg/dL (ref 8.4–10.5)
Chloride: 108 mEq/L (ref 96–112)
GFR, EST AFRICAN AMERICAN: 61 mL/min
GFR, EST NON AFRICAN AMERICAN: 53 mL/min — AB
Glucose, Bld: 114 mg/dL — ABNORMAL HIGH (ref 70–99)
Potassium: 3.9 mEq/L (ref 3.5–5.3)
Sodium: 140 mEq/L (ref 135–145)
Total Protein: 7.9 g/dL (ref 6.0–8.3)

## 2013-09-06 LAB — LIPID PANEL
CHOLESTEROL: 178 mg/dL (ref 0–200)
HDL: 65 mg/dL (ref >39)
LDL Cholesterol: 65 mg/dL (ref 0–99)
Total CHOL/HDL Ratio: 2.7 Ratio
Triglycerides: 238 mg/dL — ABNORMAL HIGH (ref <150)
VLDL: 48 mg/dL — ABNORMAL HIGH (ref 0–40)

## 2013-09-06 LAB — T-HELPER CELL (CD4) - (RCID CLINIC ONLY)
CD4 T CELL ABS: 510 /uL (ref 400–2700)
CD4 T CELL HELPER: 22 % — AB (ref 33–55)

## 2013-09-06 LAB — TROPONIN I: TROPONIN I: 0.02 ng/mL (ref <0.06)

## 2013-09-06 LAB — RPR

## 2013-09-07 ENCOUNTER — Encounter: Payer: Self-pay | Admitting: Internal Medicine

## 2013-09-07 ENCOUNTER — Other Ambulatory Visit: Payer: Self-pay | Admitting: *Deleted

## 2013-09-07 ENCOUNTER — Other Ambulatory Visit: Payer: Self-pay | Admitting: Infectious Diseases

## 2013-09-07 ENCOUNTER — Other Ambulatory Visit: Payer: Self-pay | Admitting: Internal Medicine

## 2013-09-07 DIAGNOSIS — B1920 Unspecified viral hepatitis C without hepatic coma: Secondary | ICD-10-CM

## 2013-09-07 DIAGNOSIS — B20 Human immunodeficiency virus [HIV] disease: Secondary | ICD-10-CM

## 2013-09-07 MED ORDER — EFAVIRENZ 600 MG PO TABS: 600.0000 mg | ORAL_TABLET | Freq: Every day | ORAL | Status: DC

## 2013-09-07 MED ORDER — ABACAVIR SULFATE-LAMIVUDINE 600-300 MG PO TABS: 1.0000 | ORAL_TABLET | Freq: Every day | ORAL | Status: DC

## 2013-09-07 NOTE — Progress Notes (Signed)
Case discussed with Dr. Aundra Dubin at the time of the visit.  We reviewed the resident's history and exam and pertinent patient test results.  I agree with the assessment, diagnosis, and plan of care documented in the resident's note.  No need for referral to Cardiology at this time.  Chest pain is very atypical per report.

## 2013-09-09 LAB — HIV-1 RNA QUANT-NO REFLEX-BLD

## 2013-09-27 ENCOUNTER — Telehealth: Payer: Self-pay | Admitting: *Deleted

## 2013-09-27 NOTE — Telephone Encounter (Signed)
Call to patient message left to call Clinics about appointment for Ultrasound.  Pt is scheduled for 10/11/2013 at 7 AM to arrive by 6:45 AM at [facility].  Sander Nephew, RN 09/27/2013 9:00 AM

## 2013-09-27 NOTE — Addendum Note (Signed)
Addended by: Hulan Fray on: 09/27/2013 05:59 PM   Modules accepted: Orders

## 2013-10-02 ENCOUNTER — Ambulatory Visit: Payer: Self-pay | Admitting: Infectious Diseases

## 2013-10-03 ENCOUNTER — Ambulatory Visit (HOSPITAL_COMMUNITY): Payer: Medicaid Other

## 2013-10-09 ENCOUNTER — Ambulatory Visit (HOSPITAL_COMMUNITY): Payer: Medicaid Other

## 2013-10-11 ENCOUNTER — Ambulatory Visit (HOSPITAL_COMMUNITY): Payer: Medicaid Other

## 2013-10-11 ENCOUNTER — Encounter: Payer: Medicaid Other | Admitting: Internal Medicine

## 2013-10-12 ENCOUNTER — Encounter: Payer: Medicaid Other | Admitting: Internal Medicine

## 2013-10-12 NOTE — Addendum Note (Signed)
Addended by: Truddie Crumble on: 10/12/2013 04:22 PM   Modules accepted: Orders

## 2013-10-17 ENCOUNTER — Encounter: Payer: Self-pay | Admitting: Internal Medicine

## 2013-10-24 ENCOUNTER — Ambulatory Visit (HOSPITAL_COMMUNITY): Payer: Medicaid Other

## 2013-11-01 ENCOUNTER — Telehealth: Payer: Self-pay | Admitting: *Deleted

## 2013-11-01 NOTE — Telephone Encounter (Signed)
Left patient a voice mail to call the clinic to schedule a follow up with Dr. Johnnye Sima. [name]

## 2013-11-09 ENCOUNTER — Encounter: Payer: Self-pay | Admitting: *Deleted

## 2013-12-01 ENCOUNTER — Other Ambulatory Visit: Payer: Self-pay

## 2013-12-18 ENCOUNTER — Encounter: Payer: Self-pay | Admitting: Internal Medicine

## 2014-01-02 ENCOUNTER — Other Ambulatory Visit: Payer: Self-pay | Admitting: Licensed Clinical Social Worker

## 2014-01-02 ENCOUNTER — Other Ambulatory Visit: Payer: Medicaid Other

## 2014-01-02 DIAGNOSIS — B20 Human immunodeficiency virus [HIV] disease: Secondary | ICD-10-CM

## 2014-01-02 LAB — CBC WITH DIFFERENTIAL/PLATELET
BASOS PCT: 0 % (ref 0–1)
Basophils Absolute: 0 10*3/uL (ref 0.0–0.1)
Eosinophils Absolute: 0.1 10*3/uL (ref 0.0–0.7)
Eosinophils Relative: 1 % (ref 0–5)
HCT: 36.7 % (ref 36.0–46.0)
HEMOGLOBIN: 12.9 g/dL (ref 12.0–15.0)
Lymphocytes Relative: 27 % (ref 12–46)
Lymphs Abs: 1.5 10*3/uL (ref 0.7–4.0)
MCH: 31.2 pg (ref 26.0–34.0)
MCHC: 35.1 g/dL (ref 30.0–36.0)
MCV: 88.6 fL (ref 78.0–100.0)
MPV: 10.6 fL (ref 9.4–12.4)
Monocytes Absolute: 0.5 10*3/uL (ref 0.1–1.0)
Monocytes Relative: 10 % (ref 3–12)
NEUTROS ABS: 3.3 10*3/uL (ref 1.7–7.7)
NEUTROS PCT: 62 % (ref 43–77)
Platelets: 230 10*3/uL (ref 150–400)
RBC: 4.14 MIL/uL (ref 3.87–5.11)
RDW: 12.6 % (ref 11.5–15.5)
WBC: 5.4 10*3/uL (ref 4.0–10.5)

## 2014-01-02 LAB — COMPREHENSIVE METABOLIC PANEL
ALBUMIN: 3.5 g/dL (ref 3.5–5.2)
ALK PHOS: 81 U/L (ref 39–117)
ALT: 31 U/L (ref 0–35)
AST: 59 U/L — AB (ref 0–37)
BUN: 14 mg/dL (ref 6–23)
CALCIUM: 8.8 mg/dL (ref 8.4–10.5)
CHLORIDE: 108 meq/L (ref 96–112)
CO2: 20 mEq/L (ref 19–32)
Creat: 1.3 mg/dL — ABNORMAL HIGH (ref 0.50–1.10)
GLUCOSE: 86 mg/dL (ref 70–99)
POTASSIUM: 3.6 meq/L (ref 3.5–5.3)
SODIUM: 138 meq/L (ref 135–145)
TOTAL PROTEIN: 8.3 g/dL (ref 6.0–8.3)
Total Bilirubin: 0.5 mg/dL (ref 0.2–1.2)

## 2014-01-03 LAB — HIV-1 RNA QUANT-NO REFLEX-BLD
HIV 1 RNA Quant: 53002 copies/mL — ABNORMAL HIGH (ref <20)
HIV-1 RNA Quant, Log: 4.72 {Log} — ABNORMAL HIGH (ref <1.30)

## 2014-01-03 LAB — T-HELPER CELL (CD4) - (RCID CLINIC ONLY)
CD4 % Helper T Cell: 16 % — ABNORMAL LOW (ref 33–55)
CD4 T Cell Abs: 230 /uL — ABNORMAL LOW (ref 400–2700)

## 2014-01-15 ENCOUNTER — Ambulatory Visit (INDEPENDENT_AMBULATORY_CARE_PROVIDER_SITE_OTHER): Payer: Medicaid Other | Admitting: *Deleted

## 2014-01-15 ENCOUNTER — Other Ambulatory Visit: Payer: Self-pay | Admitting: *Deleted

## 2014-01-15 ENCOUNTER — Ambulatory Visit (INDEPENDENT_AMBULATORY_CARE_PROVIDER_SITE_OTHER): Payer: Medicaid Other | Admitting: Infectious Diseases

## 2014-01-15 ENCOUNTER — Encounter: Payer: Self-pay | Admitting: Infectious Diseases

## 2014-01-15 VITALS — BP 147/87 | HR 56 | Temp 97.8°F | Wt 166.0 lb

## 2014-01-15 DIAGNOSIS — Z23 Encounter for immunization: Secondary | ICD-10-CM

## 2014-01-15 DIAGNOSIS — B182 Chronic viral hepatitis C: Secondary | ICD-10-CM

## 2014-01-15 DIAGNOSIS — B20 Human immunodeficiency virus [HIV] disease: Secondary | ICD-10-CM

## 2014-01-15 DIAGNOSIS — N289 Disorder of kidney and ureter, unspecified: Secondary | ICD-10-CM

## 2014-01-15 DIAGNOSIS — I10 Essential (primary) hypertension: Secondary | ICD-10-CM

## 2014-01-15 LAB — IRON: Iron: 94 ug/dL (ref 42–145)

## 2014-01-15 MED ORDER — LEDIPASVIR-SOFOSBUVIR 90-400 MG PO TABS: 1.0000 | ORAL_TABLET | Freq: Every day | ORAL | Status: DC

## 2014-01-15 MED ORDER — LISINOPRIL 40 MG PO TABS: 40.0000 mg | ORAL_TABLET | Freq: Every day | ORAL | Status: DC

## 2014-01-15 MED ORDER — ABACAVIR-DOLUTEGRAVIR-LAMIVUD 600-50-300 MG PO TABS: 1.0000 | ORAL_TABLET | Freq: Every day | ORAL | Status: DC

## 2014-01-15 NOTE — Assessment & Plan Note (Signed)
Hep C type 1a, VL > 1 million 2011.  Will repeat all her labs and check elastogram. Send her rx to [place].

## 2014-01-15 NOTE — Assessment & Plan Note (Signed)
Will restart her meds, ask her to hold triumeq until she has HLA documented. Offered/refuses condoms. Gets flu shot.  Will see her back in 6-8 weeks.

## 2014-01-15 NOTE — Assessment & Plan Note (Signed)
Will get her back on ACE, back into IM.

## 2014-01-15 NOTE — Progress Notes (Signed)
   Subjective:    Patient ID: Sharon Norris, female    DOB: [birth year], 58 y.o.   MRN: [id number]  HPI 58 yo F with hx of HIV+, HTN has been off all her meds due to "personal problems". Has taken on great-grand kids as grand kids ae having issues. SI trying to get everyone out of her house. Has been having headaches. Has been losing weight. Has met with bridge counselor. Needs med refills.  Interested in getting Hep C treated.   HIV 1 RNA QUANT (copies/mL)  Date Value  01/02/2014 53002*  09/05/2013 <20  10/27/2012 <20   CD4 T CELL ABS (/uL)  Date Value  01/02/2014 230*  09/05/2013 510  10/27/2012 400   Review of Systems     Objective:   Physical Exam  Constitutional: She appears well-developed and well-nourished.  HENT:  Mouth/Throat: No oropharyngeal exudate.  Eyes: EOM are normal. Pupils are equal, round, and reactive to light.  Neck: Neck supple.  Cardiovascular: Normal rate, regular rhythm and normal heart sounds.   Pulmonary/Chest: Effort normal and breath sounds normal.  Abdominal: Soft. Bowel sounds are normal. She exhibits no distension. There is no tenderness.  Musculoskeletal: She exhibits no edema.  Lymphadenopathy:    She has no cervical adenopathy.          Assessment & Plan:

## 2014-01-15 NOTE — Assessment & Plan Note (Signed)
Not improved off art. Will give her TFV sparing ART.

## 2014-01-16 LAB — ANA: ANA: NEGATIVE

## 2014-01-16 LAB — PROTIME-INR
INR: 0.98 (ref <1.50)
Prothrombin Time: 13 seconds (ref 11.6–15.2)

## 2014-01-16 LAB — HEPATITIS A ANTIBODY, TOTAL: Hep A Total Ab: REACTIVE — AB

## 2014-01-16 LAB — HEPATITIS B SURFACE ANTIBODY,QUALITATIVE: Hep B S Ab: NEGATIVE

## 2014-01-16 LAB — HEPATITIS B CORE ANTIBODY, TOTAL: HEP B C TOTAL AB: REACTIVE — AB

## 2014-01-17 ENCOUNTER — Other Ambulatory Visit: Payer: Self-pay | Admitting: Pharmacist Clinician (PhC)/ Clinical Pharmacy Specialist

## 2014-01-17 ENCOUNTER — Telehealth: Payer: Self-pay | Admitting: *Deleted

## 2014-01-17 LAB — HEPATITIS C RNA QUANTITATIVE
HCV QUANT LOG: 6.21 {Log} — AB (ref <1.18)
HCV Quantitative: 1629806 IU/mL — ABNORMAL HIGH (ref <15)

## 2014-01-17 NOTE — Telephone Encounter (Signed)
Notified case worker, Cassandra of patient's appointment for elastography at [facility] on 01/31/14 at 8:45 AM.  Sharon Norris

## 2014-01-18 ENCOUNTER — Other Ambulatory Visit: Payer: Self-pay | Admitting: *Deleted

## 2014-01-18 DIAGNOSIS — B20 Human immunodeficiency virus [HIV] disease: Secondary | ICD-10-CM

## 2014-01-18 MED ORDER — ABACAVIR-DOLUTEGRAVIR-LAMIVUD 600-50-300 MG PO TABS: 1.0000 | ORAL_TABLET | Freq: Every day | ORAL | Status: DC

## 2014-01-19 LAB — HLA B*5701: HLA-B 5701 W/RFLX HLA-B HIGH: NEGATIVE

## 2014-01-19 LAB — HEPATITIS C GENOTYPE

## 2014-01-22 ENCOUNTER — Encounter: Payer: Self-pay | Admitting: Internal Medicine

## 2014-01-22 ENCOUNTER — Ambulatory Visit (INDEPENDENT_AMBULATORY_CARE_PROVIDER_SITE_OTHER): Payer: Medicaid Other | Admitting: Internal Medicine

## 2014-01-22 VITALS — BP 182/108 | HR 99 | Temp 98.4°F | Ht 63.0 in | Wt 161.4 lb

## 2014-01-22 DIAGNOSIS — R197 Diarrhea, unspecified: Secondary | ICD-10-CM

## 2014-01-22 DIAGNOSIS — I10 Essential (primary) hypertension: Secondary | ICD-10-CM

## 2014-01-22 DIAGNOSIS — B182 Chronic viral hepatitis C: Secondary | ICD-10-CM

## 2014-01-22 MED ORDER — AMLODIPINE BESYLATE 5 MG PO TABS: 5.0000 mg | ORAL_TABLET | Freq: Every day | ORAL | Status: DC

## 2014-01-22 MED ORDER — HYDROCHLOROTHIAZIDE 25 MG PO TABS: 25.0000 mg | ORAL_TABLET | Freq: Every day | ORAL | Status: DC

## 2014-01-22 NOTE — Patient Instructions (Addendum)
General Instructions:  We will do some blood tests today. We will also like you to give [place] a sample of your stool, please send in the sample as soon as you can so we can test it.   We will start you on a new blood pressure medication called Norvasc or amlodipine. Take one tablet once a day. At you rnext clinic visit we will check if this is okay for you and increase the dose if needed. I will prescribe the lisinopril based on your lab work today.  We will be stopping the clonidine. Do not take clonidine any more.   Do not forget to get the Ultrasound done.   Please bring your medicines with you each time you come to clinic.  Medicines may include prescription medications, over-the-counter medications, herbal remedies, eye drops, vitamins, or other pills.  Diarrhea Diarrhea is frequent loose and watery bowel movements. It can cause you to feel weak and dehydrated. Dehydration can cause you to become tired and thirsty, have a dry mouth, and have decreased urination that often is dark yellow. Diarrhea is a sign of another problem, most often an infection that will not last long. In most cases, diarrhea typically lasts 2-3 days. However, it can last longer if it is a sign of something more serious. It is important to treat your diarrhea as directed by your caregiver to lessen or prevent future episodes of diarrhea. CAUSES  Some common causes include:  Gastrointestinal infections caused by viruses, bacteria, or parasites.  Food poisoning or food allergies.  Certain medicines, such as antibiotics, chemotherapy, and laxatives.  Artificial sweeteners and fructose.  Digestive disorders. HOME CARE INSTRUCTIONS  Ensure adequate fluid intake (hydration): Have 1 cup (8 oz) of fluid for each diarrhea episode. Avoid fluids that contain simple sugars or sports drinks, fruit juices, whole milk products, and sodas. Your urine should be clear or pale yellow if you are drinking enough fluids. Hydrate with an  oral rehydration solution that you can purchase at pharmacies, retail stores, and online. You can prepare an oral rehydration solution at home by mixing the following ingredients together:   - tsp table salt.   tsp baking soda.   tsp salt substitute containing potassium chloride.  1  tablespoons sugar.  1 L (34 oz) of water.  Certain foods and beverages may increase the speed at which food moves through the gastrointestinal (GI) tract. These foods and beverages should be avoided and include:  Caffeinated and alcoholic beverages.  High-fiber foods, such as raw fruits and vegetables, nuts, seeds, and whole grain breads and cereals.  Foods and beverages sweetened with sugar alcohols, such as xylitol, sorbitol, and mannitol.  Some foods may be well tolerated and may help thicken stool including:  Starchy foods, such as rice, toast, pasta, low-sugar cereal, oatmeal, grits, baked potatoes, crackers, and bagels.  Bananas.  Applesauce.  Add probiotic-rich foods to help increase healthy bacteria in the GI tract, such as yogurt and fermented milk products.  Wash your hands well after each diarrhea episode.  Only take over-the-counter or prescription medicines as directed by your caregiver.  Take a warm bath to relieve any burning or pain from frequent diarrhea episodes. SEEK IMMEDIATE MEDICAL CARE IF:   You are unable to keep fluids down.  You have persistent vomiting.  You have blood in your stool, or your stools are black and tarry.  You do not urinate in 6-8 hours, or there is only a small amount of very dark urine.  You have abdominal pain that increases or localizes.  You have weakness, dizziness, confusion, or light-headedness.  You have a severe headache.  Your diarrhea gets worse or does not get better.  You have a fever or persistent symptoms for more than 2-3 days.  You have a fever and your symptoms suddenly get worse. MAKE SURE YOU:   Understand these  instructions.  Will watch your condition.  Will get help right away if you are not doing well or get worse.

## 2014-01-22 NOTE — Progress Notes (Signed)
Patient ID: Sharon Norris, female   DOB: 01-16-1956, 58 y.o.   MRN: [id number]   Subjective:   Patient ID: Sharon Norris female   DOB: 1955-11-22 58 y.o.   MRN: [id number]  HPI: Ms.Beverlyn Danis is a 58 y.o. with PMH listed below. Pt started having body pains started about a week ago. Pt says she got the flu shot on the 30th, and then all these body aches, with diarhea, very mild cough, with some nasal congestion started.  Diarrhea started one week- loose stool, 2-3 times a day, after every meal, there is associated Generalized cramps. Pt says she has also lost some weight since all this started- per chart review- 5Lbs since 01/15/14. There is associated, nausea, but no vomouiting. Tried to eat, without much appeitite. Feels thirsty. Feels dizzy with some headache. No SOB. Pt claims complaince with her medications- HIV meds, started taking the combination pill on the 12/1, but before then she was taking the individual pills. No fever, no sick contacts, no sore throat. Several previous episodes of body aches after flu shot but without diarrhea. Pt has tried advil over the counter, which did not help.   Past Medical History  Diagnosis Date  . HIV infection   . Hypertension   . Neuropathy   . Anxiety   . Depression   . Sleep difficulties   . 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 Hepatitis     Hep. C  . Trichomonal infection     2009  . History of suicidal ideation     thought about overdose in the past  . Bronchitis    Current Outpatient Prescriptions  Medication Sig Dispense Refill  . Abacavir-Dolutegravir-Lamivud 600-50-300 MG TABS Take 1 tablet by mouth daily. 30 tablet 11  . cloNIDine (CATAPRES) 0.1 MG tablet Take 2 tablets (0.2 mg total) by mouth daily. (Patient not taking: Reported on 01/15/2014) 90 tablet 0  . cyclobenzaprine (FLEXERIL) 5 MG tablet Take 1 tablet (5 mg total) by mouth 3 (three) times daily as needed for muscle spasms. (Patient not taking: Reported on  01/15/2014) 90 tablet 0  . diphenhydramine-acetaminophen (TYLENOL PM EXTRA STRENGTH) 25-500 MG TABS Take 1 tablet by mouth at bedtime as needed.    . divalproex (DEPAKOTE ER) 500 MG 24 hr tablet TAKE 1 TABLET BY MOUTH EVERY MORNING (Patient not taking: Reported on 01/15/2014) 30 tablet 0  . divalproex (DEPAKOTE) 250 MG DR tablet TAKE 1 TABLET BY MOUTH EVERY NIGHT AT BEDTIME (Patient not taking: Reported on 01/15/2014) 30 tablet 0  . gabapentin (NEURONTIN) 300 MG capsule TAKE ONE CAPSULE BY MOUTH TWO TIMES DAILY (Patient not taking: Reported on 01/15/2014) 120 capsule 0  . hydrochlorothiazide (HYDRODIURIL) 25 MG tablet Take 1 tablet (25 mg total) by mouth daily. (Patient not taking: Reported on 01/15/2014) 90 tablet 0  . Ibuprofen 200 MG CAPS Take 2-3 capsules (400-600 mg total) by mouth 3 (three) times daily as needed (for chest wall pain- DO NOT TAKE FOR MORE THAN 7 DAYS IN A ROW). (Patient not taking: Reported on 01/15/2014) 120 each 0  . Ledipasvir-Sofosbuvir (HARVONI) 90-400 MG TABS Take 1 tablet by mouth daily. 90 tablet 0  . lisinopril (PRINIVIL,ZESTRIL) 40 MG tablet Take 1 tablet (40 mg total) by mouth daily. 90 tablet 0  . temazepam (RESTORIL) 15 MG capsule Take 1 capsule (15 mg total) by mouth at bedtime as needed for sleep. (Patient not taking: Reported on 01/15/2014) 30 capsule 1  .  traMADol (ULTRAM) 50 MG tablet Take 1 tablet (50 mg total) by mouth 3 (three) times daily as needed. (Patient not taking: Reported on 01/15/2014) 90 tablet 0   No current facility-administered medications for this visit.   Family History  Problem Relation Age of Onset  . Alcohol abuse Father   . Cancer Maternal Aunt     endometrial  . Cancer Sister     endometrial  . Cancer Sister     breast   History   Social History  . Marital Status: Single    Spouse Name: N/A    Number of Children: N/A  . Years of Education: N/A   Social History Main Topics  . Smoking status: Former Smoker    Quit date:  02/17/2000  . Smokeless tobacco: Never Used  . Alcohol Use: 0.0 oz/week     Comment: beer, occasional  . Drug Use: No  . Sexual Activity: No     Comment: pt. given condoms   Other Topics Concern  . None   Social History Narrative   Review of Systems: CONSTITUTIONAL- No Fever, weightloss, night sweat - present, or no change in appetite. SKIN- No Rash, colour changes or itching. HEAD- HAs dizziness. Mouth/throat- No Sorethroat, dentures, or bleeding gums. RESPIRATORY- No Cough or SOB. CARDIAC- No Palpitations, DOE, PND or chest pain. URINARY- No Frequency, urgency, straining or dysuria. NEUROLOGIC- No Numbness, syncope, seizures, but has burning pain in her legs bilat. [facility]- Denies depression or anxiety.  Objective:  Physical Exam: Filed Vitals:   01/22/14 1402  BP: 173/97  Pulse: 91  Temp: 98.4 F (36.9 C)  TempSrc: Oral  Height: 5\' 3"  (1.6 m)  Weight: 161 lb 6.4 oz (73.211 kg)  SpO2: 100%   GENERAL- alert, co-operative, appears as stated age, not in any distress. HEENT- Atraumatic, normocephalic, PERRL, EOMI, oral mucosa appears moist, no pharynheal erythema, no cervical LN enlargement, thyroid does not appear enlarged, neck supple. CARDIAC- RRR, no murmurs, rubs or gallops. RESP- Moving equal volumes of air, and clear to auscultation bilaterally, no wheezes or crackles. ABDOMEN- Soft, tenderness- right upper, mid and left upper quadrant, no palpable masses or organomegaly, bowel sounds reduced. BACK- Normal curvature of the spine, No tenderness along the vertebrae, no CVA tenderness. NEURO- No obvious Cr N abnormality, strenght upper and lower extremities-  intact, Gait- Normal. EXTREMITIES- pulse 2+, symmetric, no pedal edema. SKIN- Warm, dry, No rash or lesion. PSYCH- Normal mood and affect, appropriate thought content and speech.  Assessment & Plan:   The patient's case and plan of care was discussed with attending physician, Dr. Dareen Piano.  Please see problem  based charting for assessment and plan.

## 2014-01-23 DIAGNOSIS — R197 Diarrhea, unspecified: Secondary | ICD-10-CM | POA: Insufficient documentation

## 2014-01-23 LAB — COMPLETE METABOLIC PANEL WITH GFR
ALK PHOS: 86 U/L (ref 39–117)
ALT: 35 U/L (ref 0–35)
AST: 50 U/L — AB (ref 0–37)
Albumin: 3.6 g/dL (ref 3.5–5.2)
BUN: 17 mg/dL (ref 6–23)
CO2: 19 mEq/L (ref 19–32)
CREATININE: 1.03 mg/dL (ref 0.50–1.10)
Calcium: 9.3 mg/dL (ref 8.4–10.5)
Chloride: 105 mEq/L (ref 96–112)
GFR, Est African American: 68 mL/min
GFR, Est Non African American: 59 mL/min — ABNORMAL LOW
Glucose, Bld: 94 mg/dL (ref 70–99)
Potassium: 4.4 mEq/L (ref 3.5–5.3)
Sodium: 137 mEq/L (ref 135–145)
Total Bilirubin: 0.4 mg/dL (ref 0.2–1.2)
Total Protein: 8.1 g/dL (ref 6.0–8.3)

## 2014-01-23 LAB — LIPASE: LIPASE: 69 U/L (ref 0–75)

## 2014-01-23 LAB — HEPATITIS B SURFACE ANTIGEN: Hepatitis B Surface Ag: NEGATIVE

## 2014-01-23 NOTE — Assessment & Plan Note (Signed)
BP Readings from Last 3 Encounters:  01/22/14 182/108  01/15/14 147/87  09/05/13 167/97    Lab Results  Component Value Date   NA 138 01/02/2014   K 3.6 01/02/2014   CREATININE 1.3 01/02/2014    Assessment: Blood pressure control:  Uncontrolled Progress toward BP goal:   Not at goal Comments: Has not been taking her medications- Clonidine- 0.1mg , HCTZ- 25mg , Lisinopril- 40mg . Last took her medications- clonidine only 0.1mg  1 week ago- she was getting this medication from her neighbour, otherwise has not been taken any medications for at least 1 month.   Plan: Medications: For now will not refill clonidine- as she has not taken it in at least a week. This would be a good time to get her off it as she has a hx of non compliance. Will call in HCTZ- 25mg , and start Norvasc- 5mg . Will get labs before Restarting Lisinopril, as Last Bmet showed elevated Cr- 1.3, prior to that Cr- 1.13- 4 months prior,  09/05/2013. Educational resources provided:   Self management tools provided:   Other plans: See in 2 weeks, can increase Norvasc to 10mg  and restart lisinopril gradually pending Cr levels.

## 2014-01-23 NOTE — Assessment & Plan Note (Addendum)
Hepatitis panel done- Pt is immune to hep A. Hep B Core Ab- Reactive, also hep Bs Ab- neg.  Plan- Will get hep B surf antigen.

## 2014-01-23 NOTE — Progress Notes (Signed)
INTERNAL MEDICINE TEACHING ATTENDING ADDENDUM - Kiley Torrence, MD: I reviewed and discussed at the time of visit with the resident Dr. Emokpae, the patient's medical history, physical examination, diagnosis and results of pertinent tests and treatment and I agree with the patient's care as documented.  

## 2014-01-23 NOTE — Assessment & Plan Note (Addendum)
Has been having diarrhea for 1 week at least 3 times a day, no sick contacts, or recent use of Antibiotics, except HIV meds. Pt endorses weight gain, per chart she has lost ~5 lbs since 01/15/2014. Broad differential considering pts immunocompromised status- HIV. Differential includes- Colitis, infectious causes- Viral- HIV, Rota, CMV, Bacterial- C. Diff, Protozoal- cryptosporidium, Giardia. Pts last CD4- 230, and Viral load- 53, 003- 11/17.   Pt also with complaints of some dizziness, Orthostatic vital sign done in clinic- Bp stable, Systolic BP actually increased by 10 units on standing from lying,but Pulse increased by 16 points, with complaints of mild dizziness. Likely from dehydration, from diarrhea. Pt encouraged to increase fluid intake- with oral rehydration therapy. Materials given to patient and explained how to take it. Pt voiced understanding.    Plan-  GI pathogen stool panel - Stool for C. Diff - AFB smear for Cryptosporidium - Stool culture - Pt to stay hydrated, avoid sweet drinks like juices, Oral rehydration therapy- instruction given.

## 2014-01-26 ENCOUNTER — Other Ambulatory Visit (INDEPENDENT_AMBULATORY_CARE_PROVIDER_SITE_OTHER): Payer: Medicaid Other

## 2014-01-26 DIAGNOSIS — I10 Essential (primary) hypertension: Secondary | ICD-10-CM

## 2014-01-26 LAB — CBC
HCT: 39.4 % (ref 36.0–46.0)
Hemoglobin: 12.9 g/dL (ref 12.0–15.0)
MCH: 31.3 pg (ref 26.0–34.0)
MCHC: 32.7 g/dL (ref 30.0–36.0)
MCV: 95.6 fL (ref 78.0–100.0)
MPV: 10.8 fL (ref 9.4–12.4)
Platelets: 204 10*3/uL (ref 150–400)
RBC: 4.12 MIL/uL (ref 3.87–5.11)
RDW: 13.2 % (ref 11.5–15.5)
WBC: 4.6 10*3/uL (ref 4.0–10.5)

## 2014-01-26 NOTE — Addendum Note (Signed)
Addended by: Orson Gear on: 01/26/2014 09:40 AM   Modules accepted: Orders

## 2014-01-31 ENCOUNTER — Ambulatory Visit (HOSPITAL_COMMUNITY)
Admission: RE | Admit: 2014-01-31 | Discharge: 2014-01-31 | Disposition: A | Discharge status: Home / Self Care | Payer: Medicaid Other | Source: Ambulatory Visit | Attending: Infectious Diseases | Admitting: Infectious Diseases

## 2014-01-31 DIAGNOSIS — B182 Chronic viral hepatitis C: Secondary | ICD-10-CM | POA: Diagnosis not present

## 2014-01-31 LAB — OVA AND PARASITE EXAMINATION: OP: NONE SEEN

## 2014-02-01 LAB — GIARDIA/CRYPTOSPORIDIUM (EIA)
CRYPTOSPORIDIUM SCREEN (EIA) (SOL): NEGATIVE
GIARDIA SCREEN (EIA): NEGATIVE

## 2014-02-03 LAB — STOOL CULTURE

## 2014-02-05 ENCOUNTER — Ambulatory Visit: Payer: Medicaid Other | Admitting: Internal Medicine

## 2014-02-05 LAB — GASTROINTESTINAL PATHOGEN PANEL PCR

## 2014-02-13 ENCOUNTER — Other Ambulatory Visit: Payer: Self-pay | Admitting: *Deleted

## 2014-02-13 DIAGNOSIS — G609 Hereditary and idiopathic neuropathy, unspecified: Secondary | ICD-10-CM

## 2014-02-13 DIAGNOSIS — G8929 Other chronic pain: Secondary | ICD-10-CM

## 2014-02-13 DIAGNOSIS — M549 Dorsalgia, unspecified: Secondary | ICD-10-CM

## 2014-02-13 DIAGNOSIS — G4700 Insomnia, unspecified: Secondary | ICD-10-CM

## 2014-02-13 NOTE — Telephone Encounter (Signed)
Call from pt stating she needs refills on her cyclobenzaprine, gabapentin, temazepam, and tramadol.  States it was discussed at last visit with Dr Denton Brick.  Pt does not currently have an assigned pcp, will send request to Dr Denton Brick for review.  Of note, pt has an appt with DrHoffman on 02/21/2013. Uses [facility].  Please advise.Despina Hidden Cassady12/29/201510:36 AM

## 2014-02-14 MED ORDER — CYCLOBENZAPRINE HCL 5 MG PO TABS: 5.0000 mg | ORAL_TABLET | Freq: Three times a day (TID) | ORAL | Status: DC | PRN

## 2014-02-14 MED ORDER — GABAPENTIN 300 MG PO CAPS: ORAL_CAPSULE | ORAL | Status: DC

## 2014-02-14 MED ORDER — TRAMADOL HCL 50 MG PO TABS: 50.0000 mg | ORAL_TABLET | Freq: Three times a day (TID) | ORAL | Status: DC | PRN

## 2014-02-14 MED ORDER — TEMAZEPAM 15 MG PO CAPS: 15.0000 mg | ORAL_CAPSULE | Freq: Every evening | ORAL | Status: DC | PRN

## 2014-02-14 NOTE — Telephone Encounter (Signed)
Rx called in Reviewed with Dr Beryle Beams

## 2014-02-14 NOTE — Telephone Encounter (Signed)
Unfortunately I can not remember talking about this. No documentation either. Also she came in for c/o diarrhea. She should keep her appointment next week and these can be discussed. No refills for now. Thanks [employer].   Ejiro.

## 2014-02-14 NOTE — Telephone Encounter (Signed)
Pt # [id number] Pt called again today She states she has been taking all these meds.

## 2014-02-21 ENCOUNTER — Other Ambulatory Visit: Payer: Self-pay | Admitting: Pharmacist Clinician (PhC)/ Clinical Pharmacy Specialist

## 2014-02-21 ENCOUNTER — Encounter: Payer: Self-pay | Admitting: Internal Medicine

## 2014-02-21 ENCOUNTER — Ambulatory Visit (INDEPENDENT_AMBULATORY_CARE_PROVIDER_SITE_OTHER): Payer: Medicaid Other | Admitting: Internal Medicine

## 2014-02-21 VITALS — BP 132/84 | HR 85 | Temp 98.0°F | Ht 63.0 in | Wt 166.7 lb

## 2014-02-21 DIAGNOSIS — I10 Essential (primary) hypertension: Secondary | ICD-10-CM

## 2014-02-21 DIAGNOSIS — Z87891 Personal history of nicotine dependence: Secondary | ICD-10-CM

## 2014-02-21 MED ORDER — RIBAVIRIN 200 MG PO CAPS: 600.0000 mg | ORAL_CAPSULE | Freq: Two times a day (BID) | ORAL | Status: DC

## 2014-02-21 MED ORDER — OMBITAS-PARITAPRE-RITONA-DASAB 12.5-75-50 &250 MG PO TBPK: 1.0000 | ORAL_TABLET | ORAL | Status: DC

## 2014-02-21 MED ORDER — LISINOPRIL 20 MG PO TABS: 20.0000 mg | ORAL_TABLET | Freq: Every day | ORAL | Status: DC

## 2014-02-21 NOTE — Progress Notes (Signed)
INTERNAL MEDICINE CENTER Subjective:   Patient ID: Sharon Norris female   DOB: [birth year] 59 y.o.   MRN: [id number]  HPI: Sharon Norris is a 59 y.o. female with a PMH below who presents for follow up of her HTN,  Please see A&P below for further HPI details.    Past Medical History  Diagnosis Date  . HIV infection   . Hypertension   . Neuropathy   . Anxiety   . Depression   . Sleep difficulties   . 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 Hepatitis     Hep. C  . Trichomonal infection     2009  . History of suicidal ideation     thought about overdose in the past  . Bronchitis    Current Outpatient Prescriptions  Medication Sig Dispense Refill  . Abacavir-Dolutegravir-Lamivud 600-50-300 MG TABS Take 1 tablet by mouth daily. 30 tablet 11  . amLODipine (NORVASC) 5 MG tablet Take 1 tablet (5 mg total) by mouth daily. 30 tablet 1  . cloNIDine (CATAPRES) 0.1 MG tablet Take 2 tablets (0.2 mg total) by mouth daily. (Patient not taking: Reported on 01/15/2014) 90 tablet 0  . cyclobenzaprine (FLEXERIL) 5 MG tablet Take 1 tablet (5 mg total) by mouth 3 (three) times daily as needed for muscle spasms. 90 tablet 0  . diphenhydramine-acetaminophen (TYLENOL PM EXTRA STRENGTH) 25-500 MG TABS Take 1 tablet by mouth at bedtime as needed.    . divalproex (DEPAKOTE ER) 500 MG 24 hr tablet TAKE 1 TABLET BY MOUTH EVERY MORNING (Patient not taking: Reported on 01/15/2014) 30 tablet 0  . divalproex (DEPAKOTE) 250 MG DR tablet TAKE 1 TABLET BY MOUTH EVERY NIGHT AT BEDTIME (Patient not taking: Reported on 01/15/2014) 30 tablet 0  . gabapentin (NEURONTIN) 300 MG capsule TAKE ONE CAPSULE BY MOUTH TWO TIMES DAILY 120 capsule 0  . hydrochlorothiazide (HYDRODIURIL) 25 MG tablet Take 1 tablet (25 mg total) by mouth daily. 90 tablet 1  . Ibuprofen 200 MG CAPS Take 2-3 capsules (400-600 mg total) by mouth 3 (three) times daily as needed (for chest wall pain- DO NOT TAKE FOR MORE THAN 7  DAYS IN A ROW). (Patient not taking: Reported on 01/15/2014) 120 each 0  . Ledipasvir-Sofosbuvir (HARVONI) 90-400 MG TABS Take 1 tablet by mouth daily. 90 tablet 0  . lisinopril (PRINIVIL,ZESTRIL) 40 MG tablet Take 1 tablet (40 mg total) by mouth daily. 90 tablet 0  . temazepam (RESTORIL) 15 MG capsule Take 1 capsule (15 mg total) by mouth at bedtime as needed for sleep. 30 capsule 1  . traMADol (ULTRAM) 50 MG tablet Take 1 tablet (50 mg total) by mouth 3 (three) times daily as needed. 90 tablet 0   No current facility-administered medications for this visit.   Family History  Problem Relation Age of Onset  . Alcohol abuse Father   . Cancer Maternal Aunt     endometrial  . Cancer Sister     endometrial  . Cancer Sister     breast   History   Social History  . Marital Status: Single    Spouse Name: N/A    Number of Children: N/A  . Years of Education: N/A   Social History Main Topics  . Smoking status: Former Smoker    Quit date: 02/17/2000  . Smokeless tobacco: Never Used  . Alcohol Use: 0.0 oz/week    0 Not specified per week  Comment: beer, occasional  . Drug Use: No  . Sexual Activity: No     Comment: pt. given condoms   Other Topics Concern  . None   Social History Narrative   Review of Systems: Review of Systems  Constitutional: Negative for fever, chills, weight loss and malaise/fatigue.  Respiratory: Negative for cough and shortness of breath.   Musculoskeletal: Negative for myalgias.  Neurological: Positive for dizziness.  Psychiatric/Behavioral: Negative for depression.    Objective:  Physical Exam: Filed Vitals:   02/21/14 1012 02/21/14 1028  BP: 150/84 132/84  Pulse: 98 85  Temp: 98 F (36.7 C)   TempSrc: Oral   Height: 5\' 3"  (1.6 m)   Weight: 166 lb 11.2 oz (75.615 kg)   SpO2: 100%   Physical Exam  Constitutional: She is oriented to person, place, and time and well-developed, well-nourished, and in no distress.  Cardiovascular: Normal  rate, regular rhythm, normal heart sounds and intact distal pulses.   No murmur heard. Pulmonary/Chest: Effort normal and breath sounds normal.  Abdominal: Soft. Bowel sounds are normal.  Neurological: She is alert and oriented to person, place, and time.  Psychiatric: Affect and judgment normal.  Nursing note and vitals reviewed.   Assessment & Plan:  Case discussed with Dr. Lynnae January  Essential hypertension BP Readings from Last 3 Encounters:  02/21/14 132/84  01/22/14 182/108  01/15/14 147/87    Lab Results  Component Value Date   NA 137 01/22/2014   K 4.4 01/22/2014   CREATININE 1.03 01/22/2014   HPI: on patient's last visit she was non complaint with clonidine and had some AKI, clonidine was discontinued and linsinopril was held.  She was started on 5mg  of amlodipine and continued on HCTZ 25 mg.  She reports since that time she has been having some dizzy spells and occasionally seeing black dots on standing.  She also notes some cramping and she decreased her HCTZ to 12.5mg .  Assessment: Blood pressure control: controlled Progress toward BP goal:  at goal Comments: Positive orthostatics in office.  Plan: Medications:  D/C amlodipine, Continue HCTZ 25mg  daily, Resume Lisinopril at 20mg  daily Educational resources provided: brochure Self management tools provided: home blood pressure logbook (ewants a cuff to do.) Other plans: patient does not want labwork today, will have back in 1 month to recheck BP and BMP to ensure K+ is wnl (i suspect she may be mildly hypokalemic which will improve with the addition of lisinopril)     Medications Ordered Meds ordered this encounter  Medications  . lisinopril (PRINIVIL,ZESTRIL) 20 MG tablet    Sig: Take 1 tablet (20 mg total) by mouth daily.    Dispense:  90 tablet    Refill:  0    Please discontinue amlodipine.   Other Orders No orders of the defined types were placed in this encounter.

## 2014-02-21 NOTE — Patient Instructions (Signed)
General Instructions: Please stop taking amlodipine.  Start taking Lisinopril 20mg  daily.  Please return in 1 month.  Thank you for bringing your medicines today. This helps [place] keep you safe from mistakes.   Progress Toward Treatment Goals:  Treatment Goal 02/21/2014  Blood pressure at goal  Prevent falls unable to assess    Self Care Goals & Plans:  Self Care Goal 02/21/2014  Manage my medications take my medicines as prescribed; bring my medications to every visit; refill my medications on time  Monitor my health -  Eat healthy foods drink diet soda or water instead of juice or soda; eat more vegetables; eat foods that are low in salt; eat baked foods instead of fried foods; eat fruit for snacks and desserts  Be physically active -  Meeting treatment goals -    No flowsheet data found.   Care Management & Community Referrals:  Referral 02/21/2014  Referrals made for care management support none needed  Referrals made to community resources -

## 2014-02-21 NOTE — Assessment & Plan Note (Signed)
BP Readings from Last 3 Encounters:  02/21/14 132/84  01/22/14 182/108  01/15/14 147/87    Lab Results  Component Value Date   NA 137 01/22/2014   K 4.4 01/22/2014   CREATININE 1.03 01/22/2014   HPI: on patient's last visit she was non complaint with clonidine and had some AKI, clonidine was discontinued and linsinopril was held.  She was started on 5mg  of amlodipine and continued on HCTZ 25 mg.  She reports since that time she has been having some dizzy spells and occasionally seeing black dots on standing.  She also notes some cramping and she decreased her HCTZ to 12.5mg .  Assessment: Blood pressure control: controlled Progress toward BP goal:  at goal Comments: Positive orthostatics in office.  Plan: Medications:  D/C amlodipine, Continue HCTZ 25mg  daily, Resume Lisinopril at 20mg  daily Educational resources provided: brochure Self management tools provided: home blood pressure logbook (ewants a cuff to do.) Other plans: patient does not want labwork today, will have back in 1 month to recheck BP and BMP to ensure K+ is wnl (i suspect she may be mildly hypokalemic which will improve with the addition of lisinopril)

## 2014-02-22 ENCOUNTER — Telehealth: Payer: Self-pay | Admitting: Pharmacist Clinician (PhC)/ Clinical Pharmacy Specialist

## 2014-02-22 NOTE — Telephone Encounter (Signed)
HPI: Sharon Norris is a 59 y.o. female with HIV and hep C  Lab Results  Component Value Date   HCVGENOTYPE 1a 01/15/2014    Allergies: Not on File  Vitals:    Past Medical History: Past Medical History  Diagnosis Date  . HIV infection   . Hypertension   . Neuropathy   . Anxiety   . Depression   . Sleep difficulties   . 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 Hepatitis     Hep. C  . Trichomonal infection     2009  . History of suicidal ideation     thought about overdose in the past  . Bronchitis     Social History: History   Social History  . Marital Status: Single    Spouse Name: N/A    Number of Children: N/A  . Years of Education: N/A   Social History Main Topics  . Smoking status: Former Smoker    Quit date: 02/17/2000  . Smokeless tobacco: Never Used  . Alcohol Use: 0.0 oz/week    0 Not specified per week     Comment: beer, occasional  . Drug Use: No  . Sexual Activity: No     Comment: pt. given condoms   Other Topics Concern  . Not on file   Social History Narrative    Labs: HIV 1 RNA QUANT (copies/mL)  Date Value  01/02/2014 53002*  09/05/2013 <20  10/27/2012 <20   CD4 T CELL ABS (/uL)  Date Value  01/02/2014 230*  09/05/2013 510  10/27/2012 400   HEP B S AB (no units)  Date Value  01/15/2014 NEG   HEPATITIS B SURFACE AG (no units)  Date Value  01/22/2014 NEGATIVE   HCV AB (no units)  Date Value  01/03/2007 Positive    Lab Results  Component Value Date   HCVGENOTYPE 1a 01/15/2014    Hepatitis C RNA quantitative Latest Ref Rng 01/15/2014 10/28/2009  HCV Quantitative <15 IU/mL [id number]) 1060000(H)  HCV Quantitative Log <1.18 log 10 6.21(H) -    AST (U/L)  Date Value  01/22/2014 50*  01/02/2014 59*  09/05/2013 55*   ALT (U/L)  Date Value  01/22/2014 35  01/02/2014 31  09/05/2013 38*   INR (no units)  Date Value  01/15/2014 0.98  12/31/2006 1.0    CrCl: Estimated Creatinine Clearance: 56.6  mL/min (by C-G formula based on Cr of 1.03).  Fibrosis Score: F3-F4 as assessed by [facility]  Child-Pugh Score: Class A  Previous Treatment Regimen: Naive  Assessment: Pt is currently on Triumeq for her HIV. She is also infected with hep C. She has been approved for [employer]/riba. This should be ok with Triumeq. I have re-directed the prescriptions to outpt rx. She has some transportation issue so I called her today to go over the new meds. Will bring her back in 1 month for labs so we can do the extension.   Recommendations:  Rozann Lesches + Ribavirin Labs in 1 month  [facility], [place], [place].D., BCPS, AAHIVP Clinical [facility] 02/22/2014, 9:23 AM

## 2014-02-23 NOTE — Progress Notes (Signed)
Internal Medicine Clinic Attending  Case discussed with Dr. Hoffman soon after the resident saw the patient.  We reviewed the resident's history and exam and pertinent patient test results.  I agree with the assessment, diagnosis, and plan of care documented in the resident's note. 

## 2014-02-28 ENCOUNTER — Encounter: Payer: Self-pay | Admitting: Infectious Diseases

## 2014-02-28 ENCOUNTER — Ambulatory Visit (INDEPENDENT_AMBULATORY_CARE_PROVIDER_SITE_OTHER): Payer: Medicaid Other | Admitting: Infectious Diseases

## 2014-02-28 VITALS — BP 157/99 | HR 105 | Temp 97.8°F | Wt 167.8 lb

## 2014-02-28 DIAGNOSIS — B20 Human immunodeficiency virus [HIV] disease: Secondary | ICD-10-CM

## 2014-02-28 DIAGNOSIS — B182 Chronic viral hepatitis C: Secondary | ICD-10-CM

## 2014-02-28 LAB — LIPID PANEL
CHOL/HDL RATIO: 3.2 ratio
Cholesterol: 148 mg/dL (ref 0–200)
HDL: 46 mg/dL (ref >39)
LDL Cholesterol: 60 mg/dL (ref 0–99)
TRIGLYCERIDES: 212 mg/dL — AB (ref <150)
VLDL: 42 mg/dL — ABNORMAL HIGH (ref 0–40)

## 2014-02-28 MED ORDER — HYDROXYZINE HCL 25 MG PO TABS: 25.0000 mg | ORAL_TABLET | Freq: Three times a day (TID) | ORAL | Status: DC | PRN

## 2014-02-28 NOTE — Assessment & Plan Note (Signed)
Appears to be doing well on triumeq. Will check her CD4 and HIV RNA, lipids, rpr.  rtc 6 weeks.  Has gotten flu shot, refuses condoms.

## 2014-02-28 NOTE — Progress Notes (Signed)
   Subjective:    Patient ID: Sharon Norris, female    DOB: 12-31-1955, 59 y.o.   MRN: [id number]  HPI 59 yo F with hx of HIV+, HTN, and hepatitis C.  Started vikera on Monday and has since had rash and pruritis.  Has been on triumeq since last visit.   HIV 1 RNA QUANT (copies/mL)  Date Value  01/02/2014 53002*  09/05/2013 <20  10/27/2012 <20   CD4 T CELL ABS (/uL)  Date Value  01/02/2014 230*  09/05/2013 510  10/27/2012 400   No dysphagia, occas cough at night. No sob.    Review of Systems  Constitutional: Negative for appetite change and unexpected weight change.  Respiratory: Positive for cough.   Gastrointestinal: Negative for diarrhea and constipation.  Genitourinary: Negative for difficulty urinating.  Skin: Positive for rash.       Objective:   Physical Exam  Constitutional: She appears well-developed and well-nourished.  HENT:  Mouth/Throat: No oropharyngeal exudate.  Eyes: EOM are normal. Pupils are equal, round, and reactive to light.  Neck: Neck supple.  Cardiovascular: Normal rate, regular rhythm and normal heart sounds.   Pulmonary/Chest: Effort normal and breath sounds normal.  Abdominal: Soft. Bowel sounds are normal. She exhibits no distension. There is no tenderness.  Lymphadenopathy:    She has no cervical adenopathy.  Skin: Rash noted.          Assessment & Plan:

## 2014-02-28 NOTE — Assessment & Plan Note (Signed)
Has allergic rxn to vikera pack, riba. Will stop, give her atarax. Will see her back in 6 weeks, consider harvoni them.

## 2014-03-01 LAB — RPR

## 2014-03-02 LAB — T-HELPER CELL (CD4) - (RCID CLINIC ONLY)
CD4 % Helper T Cell: 19 % — ABNORMAL LOW (ref 33–55)
CD4 T CELL ABS: 380 /uL — AB (ref 400–2700)

## 2014-03-02 LAB — HIV-1 RNA ULTRAQUANT REFLEX TO GENTYP+
HIV 1 RNA Quant: <20 copies/mL (ref <20)
HIV-1 RNA Quant, Log: <1.3 {Log} (ref <1.30)

## 2014-03-05 ENCOUNTER — Other Ambulatory Visit: Payer: Self-pay | Admitting: Pharmacist Clinician (PhC)/ Clinical Pharmacy Specialist

## 2014-03-05 MED ORDER — LEDIPASVIR-SOFOSBUVIR 90-400 MG PO TABS: 1.0000 | ORAL_TABLET | Freq: Every day | ORAL | Status: DC

## 2014-03-13 ENCOUNTER — Other Ambulatory Visit: Payer: Self-pay | Admitting: Internal Medicine

## 2014-03-15 ENCOUNTER — Other Ambulatory Visit: Payer: Self-pay | Admitting: Internal Medicine

## 2014-03-16 NOTE — Telephone Encounter (Signed)
Amlodipine was discontinued at last visit.

## 2014-03-16 NOTE — Telephone Encounter (Signed)
Rx x 3 called in to pharmacy.

## 2014-03-16 NOTE — Telephone Encounter (Signed)
Rx faxed in.

## 2014-03-16 NOTE — Telephone Encounter (Signed)
Please address use/need for Tramadol, Restoril, and Flexeril at next appointment.

## 2014-03-19 NOTE — Telephone Encounter (Signed)
Pharmacy notified of discontinuance

## 2014-03-22 ENCOUNTER — Other Ambulatory Visit: Payer: Medicaid Other

## 2014-03-22 ENCOUNTER — Other Ambulatory Visit: Payer: Self-pay | Admitting: Pharmacist Clinician (PhC)/ Clinical Pharmacy Specialist

## 2014-03-22 DIAGNOSIS — B182 Chronic viral hepatitis C: Secondary | ICD-10-CM

## 2014-03-26 ENCOUNTER — Ambulatory Visit: Payer: Medicaid Other | Admitting: Internal Medicine

## 2014-04-11 ENCOUNTER — Other Ambulatory Visit: Payer: Self-pay | Admitting: Internal Medicine

## 2014-04-11 ENCOUNTER — Other Ambulatory Visit: Payer: Self-pay | Admitting: Infectious Diseases

## 2014-04-11 ENCOUNTER — Encounter: Payer: Medicaid Other | Admitting: Infectious Diseases

## 2014-04-12 ENCOUNTER — Other Ambulatory Visit: Payer: Medicaid Other

## 2014-04-12 NOTE — Telephone Encounter (Addendum)
Rx called in Message to front desk to make appointment

## 2014-04-12 NOTE — Telephone Encounter (Signed)
Patient needs clinic appointment to address these meds.

## 2014-04-13 NOTE — Progress Notes (Signed)
Patient ID: Sharon Norris, female   DOB: 1955-10-15, 59 y.o.   MRN: [id number]   This note is an error

## 2014-04-19 ENCOUNTER — Telehealth: Payer: Self-pay | Admitting: Internal Medicine

## 2014-04-19 NOTE — Telephone Encounter (Signed)
Call to patient to confirm appointment for 04/20/14 at 1:15. lmtcb

## 2014-04-20 ENCOUNTER — Encounter: Payer: Self-pay | Admitting: Internal Medicine

## 2014-04-20 ENCOUNTER — Ambulatory Visit: Payer: Medicaid Other | Admitting: Internal Medicine

## 2014-04-23 ENCOUNTER — Other Ambulatory Visit: Payer: Self-pay | Admitting: *Deleted

## 2014-04-23 ENCOUNTER — Encounter: Payer: Self-pay | Admitting: Pharmacist Clinician (PhC)/ Clinical Pharmacy Specialist

## 2014-04-23 DIAGNOSIS — B20 Human immunodeficiency virus [HIV] disease: Secondary | ICD-10-CM

## 2014-04-23 MED ORDER — ABACAVIR-DOLUTEGRAVIR-LAMIVUD 600-50-300 MG PO TABS: 1.0000 | ORAL_TABLET | Freq: Every day | ORAL | Status: DC

## 2014-04-23 NOTE — Progress Notes (Signed)
Patient ID: Sharon Norris, female   DOB: 1955/06/24, 59 y.o.   MRN: [id number] It has been very difficult to get in touch with her for her to come and get her 2nd month supply of Harvoni. She also needs lab to get the extension of the third month. [name] is going to take her 2nd month supply to her today. She finally answer the phone. [name] has the 2nd month supply now.   [name], PharmD Pager: [phone] 04/23/2014 3:19 PM

## 2014-04-25 ENCOUNTER — Other Ambulatory Visit: Payer: Medicaid Other

## 2014-05-08 ENCOUNTER — Other Ambulatory Visit: Payer: Self-pay | Admitting: *Deleted

## 2014-05-08 MED ORDER — TRAMADOL HCL 50 MG PO TABS: 50.0000 mg | ORAL_TABLET | Freq: Three times a day (TID) | ORAL | Status: DC | PRN

## 2014-05-08 MED ORDER — TEMAZEPAM 15 MG PO CAPS: 15.0000 mg | ORAL_CAPSULE | Freq: Every day | ORAL | Status: DC

## 2014-05-08 NOTE — Telephone Encounter (Signed)
Faxed to pharm 

## 2014-05-10 ENCOUNTER — Other Ambulatory Visit (INDEPENDENT_AMBULATORY_CARE_PROVIDER_SITE_OTHER): Payer: Medicaid Other

## 2014-05-10 DIAGNOSIS — B20 Human immunodeficiency virus [HIV] disease: Secondary | ICD-10-CM | POA: Diagnosis not present

## 2014-05-10 DIAGNOSIS — B182 Chronic viral hepatitis C: Secondary | ICD-10-CM | POA: Diagnosis present

## 2014-05-10 LAB — COMPLETE METABOLIC PANEL WITH GFR
ALT: 12 U/L (ref 0–35)
AST: 18 U/L (ref 0–37)
Albumin: 4 g/dL (ref 3.5–5.2)
Alkaline Phosphatase: 67 U/L (ref 39–117)
BUN: 41 mg/dL — AB (ref 6–23)
CALCIUM: 9.4 mg/dL (ref 8.4–10.5)
CO2: 17 meq/L — AB (ref 19–32)
Chloride: 105 mEq/L (ref 96–112)
Creat: 1.79 mg/dL — ABNORMAL HIGH (ref 0.50–1.10)
GFR, EST AFRICAN AMERICAN: 35 mL/min — AB
GFR, Est Non African American: 30 mL/min — ABNORMAL LOW
Glucose, Bld: 95 mg/dL (ref 70–99)
Potassium: 4 mEq/L (ref 3.5–5.3)
Sodium: 136 mEq/L (ref 135–145)
Total Bilirubin: 0.4 mg/dL (ref 0.2–1.2)
Total Protein: 8.4 g/dL — ABNORMAL HIGH (ref 6.0–8.3)

## 2014-05-10 LAB — CBC WITH DIFFERENTIAL/PLATELET
BASOS ABS: 0.1 10*3/uL (ref 0.0–0.1)
Basophils Relative: 1 % (ref 0–1)
EOS ABS: 0.1 10*3/uL (ref 0.0–0.7)
Eosinophils Relative: 2 % (ref 0–5)
HEMATOCRIT: 36.1 % (ref 36.0–46.0)
Hemoglobin: 12.4 g/dL (ref 12.0–15.0)
Lymphocytes Relative: 38 % (ref 12–46)
Lymphs Abs: 2.2 10*3/uL (ref 0.7–4.0)
MCH: 33.2 pg (ref 26.0–34.0)
MCHC: 34.3 g/dL (ref 30.0–36.0)
MCV: 96.8 fL (ref 78.0–100.0)
MONOS PCT: 7 % (ref 3–12)
MPV: 10.6 fL (ref 8.6–12.4)
Monocytes Absolute: 0.4 10*3/uL (ref 0.1–1.0)
NEUTROS PCT: 52 % (ref 43–77)
Neutro Abs: 3 10*3/uL (ref 1.7–7.7)
PLATELETS: 211 10*3/uL (ref 150–400)
RBC: 3.73 MIL/uL — ABNORMAL LOW (ref 3.87–5.11)
RDW: 13.6 % (ref 11.5–15.5)
WBC: 5.8 10*3/uL (ref 4.0–10.5)

## 2014-05-12 LAB — HEPATITIS C RNA QUANTITATIVE
HCV QUANT LOG: 1.3 {Log} — AB (ref <1.18)
HCV QUANT: 20 [IU]/mL — AB (ref <15)

## 2014-05-15 ENCOUNTER — Other Ambulatory Visit: Payer: Self-pay | Admitting: Internal Medicine

## 2014-05-15 ENCOUNTER — Other Ambulatory Visit: Payer: Self-pay | Admitting: Infectious Diseases

## 2014-05-30 ENCOUNTER — Other Ambulatory Visit: Payer: Self-pay | Admitting: Internal Medicine

## 2014-05-30 ENCOUNTER — Other Ambulatory Visit: Payer: Self-pay | Admitting: Infectious Diseases

## 2014-06-01 ENCOUNTER — Other Ambulatory Visit: Payer: Self-pay | Admitting: Infectious Diseases

## 2014-06-04 ENCOUNTER — Other Ambulatory Visit: Payer: Self-pay | Admitting: Internal Medicine

## 2014-06-06 ENCOUNTER — Ambulatory Visit: Payer: Medicaid Other | Admitting: Internal Medicine

## 2014-06-06 ENCOUNTER — Encounter: Payer: Self-pay | Admitting: Internal Medicine

## 2014-07-13 ENCOUNTER — Other Ambulatory Visit: Payer: Self-pay | Admitting: Internal Medicine

## 2014-07-27 ENCOUNTER — Other Ambulatory Visit: Payer: Self-pay | Admitting: Infectious Diseases

## 2014-07-30 NOTE — Telephone Encounter (Signed)
pt states rx no longer needed

## 2014-08-10 ENCOUNTER — Other Ambulatory Visit: Payer: Self-pay | Admitting: Infectious Diseases

## 2014-09-10 ENCOUNTER — Ambulatory Visit: Payer: Medicaid Other | Admitting: Infectious Diseases

## 2014-09-13 ENCOUNTER — Telehealth: Payer: Self-pay | Admitting: *Deleted

## 2014-09-13 NOTE — Telephone Encounter (Signed)
Patient no-showed her appointment 7/25. Unable to contact her.  cc'ing Minh as patient was also being treated for Hep C. Landis Gandy, RN

## 2014-09-17 ENCOUNTER — Encounter: Payer: Self-pay | Admitting: Internal Medicine

## 2014-09-24 ENCOUNTER — Other Ambulatory Visit: Payer: Self-pay | Admitting: Internal Medicine

## 2014-09-25 NOTE — Telephone Encounter (Signed)
Seen Jan. BP meds changed. No showed next 3 appt. Pls ask her to sch appt.

## 2014-10-10 ENCOUNTER — Other Ambulatory Visit: Payer: Self-pay | Admitting: Internal Medicine

## 2014-10-10 ENCOUNTER — Other Ambulatory Visit: Payer: Self-pay | Admitting: Infectious Diseases

## 2014-10-10 NOTE — Telephone Encounter (Signed)
Message sent to front desk to schedule.

## 2014-10-10 NOTE — Telephone Encounter (Signed)
Patient needs clinic appointment

## 2014-10-17 ENCOUNTER — Other Ambulatory Visit: Payer: Self-pay | Admitting: Internal Medicine

## 2014-10-18 NOTE — Telephone Encounter (Signed)
Rx called in to pharmacy. 

## 2014-10-23 ENCOUNTER — Other Ambulatory Visit: Payer: Self-pay | Admitting: Internal Medicine

## 2014-10-29 ENCOUNTER — Other Ambulatory Visit: Payer: Self-pay | Admitting: Internal Medicine

## 2014-10-29 NOTE — Telephone Encounter (Signed)
[facility], needs pain medication and blood pressure medications

## 2014-10-30 MED ORDER — TRAMADOL HCL 50 MG PO TABS: 50.0000 mg | ORAL_TABLET | Freq: Three times a day (TID) | ORAL | Status: DC | PRN

## 2014-10-30 MED ORDER — LISINOPRIL 20 MG PO TABS: ORAL_TABLET | ORAL | Status: DC

## 2014-10-30 NOTE — Telephone Encounter (Signed)
Please phone in tramadol 

## 2014-10-30 NOTE — Telephone Encounter (Signed)
Rx called in 

## 2014-11-06 ENCOUNTER — Encounter: Payer: Medicaid Other | Admitting: Internal Medicine

## 2014-11-13 ENCOUNTER — Encounter: Payer: Self-pay | Admitting: Internal Medicine

## 2014-11-13 ENCOUNTER — Encounter: Payer: Medicaid Other | Admitting: Internal Medicine

## 2014-12-03 ENCOUNTER — Other Ambulatory Visit: Payer: Self-pay | Admitting: Internal Medicine

## 2014-12-03 NOTE — Telephone Encounter (Signed)
Tramadol rx faxed to [facility]

## 2014-12-03 NOTE — Telephone Encounter (Signed)
Message sent to front office to schedule pt an appt. 

## 2014-12-03 NOTE — Telephone Encounter (Signed)
Patient needs clinic appointment ASAP

## 2014-12-04 ENCOUNTER — Other Ambulatory Visit: Payer: Self-pay | Admitting: Internal Medicine

## 2014-12-04 NOTE — Telephone Encounter (Signed)
Called pharmacy, they rec'd script yesterday and it is ready, called pt informed her to call pharm

## 2014-12-04 NOTE — Telephone Encounter (Signed)
Pt requesting tramadol to be filled @ [facility]

## 2014-12-14 ENCOUNTER — Other Ambulatory Visit: Payer: Self-pay | Admitting: Internal Medicine

## 2014-12-14 NOTE — Telephone Encounter (Signed)
Can we phone in Restoril? Thanks.

## 2014-12-15 ENCOUNTER — Other Ambulatory Visit: Payer: Self-pay | Admitting: Internal Medicine

## 2014-12-17 NOTE — Telephone Encounter (Signed)
Already filled on 12/14/14

## 2014-12-18 ENCOUNTER — Encounter: Payer: Medicaid Other | Admitting: Internal Medicine

## 2014-12-18 NOTE — Telephone Encounter (Signed)
restoril called in

## 2015-01-01 ENCOUNTER — Encounter: Payer: Self-pay | Admitting: Student

## 2015-01-08 ENCOUNTER — Encounter: Payer: Self-pay | Admitting: Internal Medicine

## 2015-01-08 ENCOUNTER — Encounter: Payer: Medicaid Other | Admitting: Internal Medicine

## 2015-01-09 ENCOUNTER — Other Ambulatory Visit: Payer: Self-pay | Admitting: Infectious Diseases

## 2015-01-09 ENCOUNTER — Other Ambulatory Visit: Payer: Self-pay | Admitting: Internal Medicine

## 2015-01-17 NOTE — Telephone Encounter (Signed)
Patient needs appointment.

## 2015-01-17 NOTE — Telephone Encounter (Signed)
rx phoned in- attempted to call patient but number not working and secondary was to a department store.  I sent pt a message via my chart informing that no additional refills would be given until she is seen in clinic.

## 2015-02-05 ENCOUNTER — Other Ambulatory Visit: Payer: Self-pay | Admitting: Infectious Diseases

## 2015-02-05 ENCOUNTER — Other Ambulatory Visit: Payer: Self-pay | Admitting: Internal Medicine

## 2015-02-05 DIAGNOSIS — B20 Human immunodeficiency virus [HIV] disease: Secondary | ICD-10-CM

## 2015-02-05 NOTE — Telephone Encounter (Signed)
I just filled on 01/17/15

## 2015-02-12 ENCOUNTER — Other Ambulatory Visit: Payer: Self-pay | Admitting: Internal Medicine

## 2015-02-12 DIAGNOSIS — N183 Chronic kidney disease, stage 3 unspecified: Secondary | ICD-10-CM

## 2015-02-19 ENCOUNTER — Encounter: Payer: Self-pay | Admitting: Internal Medicine

## 2015-02-19 ENCOUNTER — Ambulatory Visit (HOSPITAL_COMMUNITY)
Admission: RE | Admit: 2015-02-19 | Discharge: 2015-02-19 | Disposition: A | Discharge status: Home / Self Care | Payer: Medicaid Other | Source: Ambulatory Visit | Attending: Internal Medicine | Admitting: Internal Medicine

## 2015-02-19 ENCOUNTER — Ambulatory Visit (INDEPENDENT_AMBULATORY_CARE_PROVIDER_SITE_OTHER): Payer: Medicaid Other | Admitting: Infectious Diseases

## 2015-02-19 ENCOUNTER — Other Ambulatory Visit (HOSPITAL_COMMUNITY)
Admission: RE | Admit: 2015-02-19 | Discharge: 2015-02-19 | Disposition: A | Discharge status: Home / Self Care | Payer: Medicaid Other | Source: Ambulatory Visit | Attending: Internal Medicine | Admitting: Internal Medicine

## 2015-02-19 ENCOUNTER — Ambulatory Visit (INDEPENDENT_AMBULATORY_CARE_PROVIDER_SITE_OTHER): Payer: Medicaid Other | Admitting: Internal Medicine

## 2015-02-19 ENCOUNTER — Encounter: Payer: Self-pay | Admitting: Infectious Diseases

## 2015-02-19 VITALS — BP 150/70 | HR 70 | Temp 98.2°F | Ht 63.0 in | Wt 197.3 lb

## 2015-02-19 VITALS — BP 163/72 | HR 83 | Temp 98.2°F | Wt 195.5 lb

## 2015-02-19 DIAGNOSIS — G8929 Other chronic pain: Secondary | ICD-10-CM | POA: Diagnosis not present

## 2015-02-19 DIAGNOSIS — G629 Polyneuropathy, unspecified: Secondary | ICD-10-CM

## 2015-02-19 DIAGNOSIS — J069 Acute upper respiratory infection, unspecified: Secondary | ICD-10-CM

## 2015-02-19 DIAGNOSIS — J029 Acute pharyngitis, unspecified: Secondary | ICD-10-CM | POA: Diagnosis present

## 2015-02-19 DIAGNOSIS — B20 Human immunodeficiency virus [HIV] disease: Secondary | ICD-10-CM

## 2015-02-19 DIAGNOSIS — R918 Other nonspecific abnormal finding of lung field: Secondary | ICD-10-CM | POA: Diagnosis not present

## 2015-02-19 DIAGNOSIS — Z21 Asymptomatic human immunodeficiency virus [HIV] infection status: Secondary | ICD-10-CM

## 2015-02-19 DIAGNOSIS — F99 Mental disorder, not otherwise specified: Secondary | ICD-10-CM | POA: Diagnosis not present

## 2015-02-19 DIAGNOSIS — B182 Chronic viral hepatitis C: Secondary | ICD-10-CM | POA: Diagnosis not present

## 2015-02-19 DIAGNOSIS — M545 Low back pain: Secondary | ICD-10-CM | POA: Diagnosis not present

## 2015-02-19 DIAGNOSIS — I10 Essential (primary) hypertension: Secondary | ICD-10-CM

## 2015-02-19 DIAGNOSIS — R05 Cough: Secondary | ICD-10-CM | POA: Diagnosis not present

## 2015-02-19 DIAGNOSIS — Z113 Encounter for screening for infections with a predominantly sexual mode of transmission: Secondary | ICD-10-CM | POA: Diagnosis present

## 2015-02-19 DIAGNOSIS — G620 Drug-induced polyneuropathy: Secondary | ICD-10-CM

## 2015-02-19 DIAGNOSIS — M549 Dorsalgia, unspecified: Secondary | ICD-10-CM

## 2015-02-19 DIAGNOSIS — G4700 Insomnia, unspecified: Secondary | ICD-10-CM

## 2015-02-19 MED ORDER — GABAPENTIN 800 MG PO TABS: 800.0000 mg | ORAL_TABLET | Freq: Three times a day (TID) | ORAL | Status: DC

## 2015-02-19 MED ORDER — TRAMADOL HCL 50 MG PO TABS: ORAL_TABLET | ORAL | Status: DC

## 2015-02-19 MED ORDER — HYDROCHLOROTHIAZIDE 25 MG PO TABS: 12.5000 mg | ORAL_TABLET | Freq: Every day | ORAL | Status: DC

## 2015-02-19 MED ORDER — DIVALPROEX SODIUM 250 MG PO DR TAB: 250.0000 mg | DELAYED_RELEASE_TABLET | Freq: Every day | ORAL | Status: DC

## 2015-02-19 MED ORDER — LISINOPRIL 20 MG PO TABS: ORAL_TABLET | ORAL | Status: DC

## 2015-02-19 MED ORDER — AZITHROMYCIN 250 MG PO TABS: ORAL_TABLET | ORAL | Status: DC

## 2015-02-19 MED ORDER — HYDROCHLOROTHIAZIDE 25 MG PO TABS: 25.0000 mg | ORAL_TABLET | Freq: Every day | ORAL | Status: DC

## 2015-02-19 MED ORDER — TEMAZEPAM 15 MG PO CAPS: 15.0000 mg | ORAL_CAPSULE | Freq: Every evening | ORAL | Status: DC | PRN

## 2015-02-19 MED ORDER — DIVALPROEX SODIUM ER 500 MG PO TB24: 500.0000 mg | ORAL_TABLET | Freq: Every morning | ORAL | Status: DC

## 2015-02-19 NOTE — Progress Notes (Signed)
   Subjective:    Patient ID: Sharon Norris, female    DOB: 1955/10/05, 60 y.o.   MRN: [id number]  HPI 60 yo F with hx of HIV+, HTN, and hepatitis C.  Started vikera but had allergic reaction. She was then changed to harvoni which she also did not complete.  Has been on triumeq.   HIV 1 RNA QUANT (copies/mL)  Date Value  02/28/2014 <20  01/02/2014 53002*  09/05/2013 <20   CD4 T CELL ABS (/uL)  Date Value  02/28/2014 380*  01/02/2014 230*  09/05/2013 510   Seen today at IM for "cold", had blood work and CXR. Has been having cough sore throat for 2 months. Rattling when she lays flat.  Has been gaining wt, had leg swelling. DOE Has been off lisinopril for last few days.   Review of Systems  Constitutional: Positive for unexpected weight change. Negative for fever, chills and appetite change.  Respiratory: Positive for cough and shortness of breath.   Cardiovascular: Positive for chest pain.  Gastrointestinal: Negative for diarrhea and constipation.  Genitourinary: Negative for difficulty urinating.  Neurological: Positive for headaches.       Objective:   Physical Exam  Constitutional: She appears well-developed and well-nourished.  HENT:  Mouth/Throat: No oropharyngeal exudate.  Eyes: EOM are normal. Pupils are equal, round, and reactive to light.  Neck: Neck supple.  Cardiovascular: Normal rate, regular rhythm and normal heart sounds.   Pulmonary/Chest: Effort normal and breath sounds normal.  Abdominal: Soft. Bowel sounds are normal. There is no tenderness. There is no rebound.  Musculoskeletal: She exhibits edema.  Lymphadenopathy:    She has no cervical adenopathy.      Assessment & Plan:

## 2015-02-19 NOTE — Assessment & Plan Note (Signed)
She reports compliance with her Triumeq. She will see Dr. Johnnye Sima this afternoon. Will check HIV RNA, CD4, RPR, lipids, and cmet today.

## 2015-02-19 NOTE — Assessment & Plan Note (Signed)
Her sore throat and productive cough have been present for 2 months now. No fever, lungs clear on exam, no wheezing appreciated. Given her HIV and loss of follow up will check a CXR to make sure she does not have a pneumonia. Also getting CBC for routine blood work and will evaluate WBCs. Other possibility could be GERD-related cough as she has a hx and reports she has not been taking anything.  - Get CXR - CBC - Advised to continue halls cough drops, fluids, and hot tea

## 2015-02-19 NOTE — Assessment & Plan Note (Signed)
Will give her z-pack Will check TTE due to her wt gain, LE edema (non-pitting), HTN, duration of cough.

## 2015-02-19 NOTE — Assessment & Plan Note (Signed)
No longer taking Harvoni. Per Dr. Algis Downs last note it seems she was supposed to take Atarax and then possibly restart Harvoni but she has been lost to follow up. She did not seem familiar with Atarax when we reviewed her medication list so I doubt she had taken it. Will follow up Dr. Algis Downs plans for her Hep C treatment. Will check HCV RNA today.

## 2015-02-19 NOTE — Assessment & Plan Note (Addendum)
She was seen by Dr Luisa Hart today whom I greatly appreciate.  Await her labs.  She refuses flu shot.  Will see her back in 1 month Get her into dental clinic Being set up by IM for colon, pap.

## 2015-02-19 NOTE — Assessment & Plan Note (Signed)
-   Referral to GI for colonoscopy - Will work on getting mammo and pap smear at next visit

## 2015-02-19 NOTE — Assessment & Plan Note (Signed)
Will increase her Gabapentin to 800 mg TID as most her pain, including her chronic lower back and upper leg pain seems to be neuropathic in nature. Re-evaluate her symptoms next visit.

## 2015-02-19 NOTE — Assessment & Plan Note (Signed)
She reports significant pain that seems to be interfering with her daily life. Her imaging studies have not been impressive in the past. Her pain seems neuropathic and hopefully the increase in Gabapentin will help. She has been doing well with Tramadol too. She reports taking Tramadol 50 mg twice daily and last dose was on 02/18/15 at 9 PM. We discussed and went over the pain contract and she has agreed to look this over. Since this is her first visit with me I will postpone signing the pain contract until next visit so we can get a better idea of her pain medication needs. Will obtain UDS today. Would not be a good candidate for chronic opioid therapy due to her previous IV drug abuse history. - Prescribed Tramadol 50 mg twice daily PRN #60 tablets, 2 refills - Follow up in 3 months - Sign pain contract next visit - UDS today

## 2015-02-19 NOTE — Progress Notes (Signed)
Internal Medicine Clinic Attending  Case discussed with Dr. Rivet at the time of the visit.  We reviewed the resident's history and exam and pertinent patient test results.  I agree with the assessment, diagnosis, and plan of care documented in the resident's note.  

## 2015-02-19 NOTE — Patient Instructions (Signed)
It was a pleasure taking care of you today, [name].  - Please take your HCTZ 25 mg daily and Lisinopril 20 mg daily for your blood pressure - We will go over a pain contract next time you come in to visit - Follow up in 3 months - Blood work today - Chest x-ray today to evaluate your cough since you've had it for 2 months  General Instructions:   Please bring your medicines with you each time you come to clinic.  Medicines may include prescription medications, over-the-counter medications, herbal remedies, eye drops, vitamins, or other pills.   Progress Toward Treatment Goals:  Treatment Goal 02/21/2014  Blood pressure at goal  Prevent falls unable to assess    Self Care Goals & Plans:  Self Care Goal 02/21/2014  Manage my medications take my medicines as prescribed; bring my medications to every visit; refill my medications on time  Monitor my health -  Eat healthy foods drink diet soda or water instead of juice or soda; eat more vegetables; eat foods that are low in salt; eat baked foods instead of fried foods; eat fruit for snacks and desserts  Be physically active -  Meeting treatment goals -    No flowsheet data found.   Care Management & Community Referrals:  Referral 02/21/2014  Referrals made for care management support none needed  Referrals made to community resources -

## 2015-02-19 NOTE — Assessment & Plan Note (Signed)
Insomnia seems to be worse secondary to her back/leg pain. Will continue Restoril for now and re-evaluate at next visit.

## 2015-02-19 NOTE — Assessment & Plan Note (Signed)
Follows at [facility]. Refilled her Depakote 500 mg QAM and 250 mg QPM. Advised her to follow up at [facility] and gets future refills for Depakote there.

## 2015-02-19 NOTE — Assessment & Plan Note (Signed)
Her LFTs and Hep C RNA are pending from today.  She did not complete previous rx (harvoni, vikera).

## 2015-02-19 NOTE — Assessment & Plan Note (Signed)
BP Readings from Last 3 Encounters:  02/19/15 150/70  02/28/14 157/99  02/21/14 132/84    Lab Results  Component Value Date   NA 136 05/10/2014   K 4.0 05/10/2014   CREATININE 1.79* 05/10/2014    Assessment: Blood pressure control:  Mildly elevated Progress toward BP goal:   Unchanged Comments: BP likely mildly elevated secondary to running out of her Lisinopril and not taking correct doses. Refilled her Lisinopril and advised her to continue taking HCTZ 12.5 mg daily and that we can adjust dose at next visit if needed.   Plan: Medications:  Continue Lisinopril 20 mg daily and HCTZ 12.5 mg daily Other plans:  - Will consider increasing HCTZ at next visit  - Check bmet today

## 2015-02-19 NOTE — Progress Notes (Signed)
Subjective:    Patient ID: Sharon Norris, female    DOB: 1956/01/24, 60 y.o.   MRN: [id number]  HPI Ms. Bumann is a 60yo woman with PMHx of HTN, peripheral neuropathy, previous IVDU, HIV, and chronic hepatitis C who presents today for follow up of her HTN.  HTN: BP mildly elevated at 161/62 today. She states she ran out of her Lisinopril about 1 week ago. She also notes only taking half of her HCTZ dose (12.5 mg daily) due to getting muscle cramps in her legs when taking the full dose.   Chronic Hep C: Reports she was being treated with Harvoni and Ribavirin but that she had an allergic reaction to Harvoni by breaking out into hives. She is no longer taking these medications. She will see Dr. Johnnye Sima this afternoon for further management.   HIV: Last CD4 380 and VL < 20 in Jan 2016. She follows with Dr. Johnnye Sima, although has not seen him for 1 year. She is still taking her Triumeq. She has an appointment with Dr. Johnnye Sima this afternoon.   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 She reports having a burning sensation in her upper part of her legs, but limited sensation towards the bottom. She has been taking Gabapentin 600 mg TID even though only previously prescribed 300 mg BID.   Chronic Low Back Pain and Bilateral Leg Pain: She reports a 7 year history of low back pain that extends into her legs bilaterally. She describes the pain as "excruciating" and sharp/burning in nature as if a "knife stabbing in the back". She has been taking Tramadol 50 mg twice daily for the past year which seems to control her pain. She states her pain has worsened recently due to running out of Tramadol early. She was given a Rx for 30 tablets last month since she had not been seen in 1 year. MRI Lumbar Spine in 2013 showed no stenosis and only mild scattered disc bulging and degenerative changes. She denies any warning symptoms such  as weight loss, difficulty going up stairs, saddle anesthesia, or loss of bowel or bladder control. She does note the pain interferes with her daily life, including being able to stand up for a long period of time to go grocery shopping or get household work done.  Upper Respiratory Infection: She reports since before Thanksgiving she has had a sore throat and "hacking cough that won't go away." She reports increased sputum production with green/gray mucus. She reports SOB and wheezing occasionally. She denies fevers, chills, poor appetite. She has been taking Halls cough drops and drinking hot tea with little relief.   Depression: Well controlled on Depakote. She follows at [facility]. She reports difficulty sleeping, but denies changes in appetite, weight changes, difficulty concentrating, feeling hopeless, or suicidal/homicidal ideation.    Insomnia: Takes Restoril 15 mg QHS as needed. However, she notes she ran out of Restoril and has not been sleeping well due to pain in her back and legs.    Review of Systems General: Denies night sweats, changes in weight, changes in appetite HEENT: Denies headaches, ear pain, changes in vision CV: Denies CP, palpitations, orthopnea Pulm: See HPI GI: Denies abdominal pain, nausea, vomiting, diarrhea, constipation, melena, hematochezia GU: Denies dysuria, hematuria, frequency Msk: Reports muscle cramps when taking full dose of HCTZ. Reports low back pain.  Neuro: Denies weakness Skin: Denies rashes, bruising Psych: Denies hallucinations  Objective:   Physical Exam General: alert, sitting up, NAD HEENT: /AT, EOMI, sclera anicteric, pharynx non-erythematous, mucus membranes moist, no thrush Neck: supple, submandibular adenopathy CV: RRR, no m/g/r Pulm: CTA bilaterally, breaths non-labored Abd: BS+, soft, obese, non-tender Ext: warm, no peripheral edema Neuro: alert and oriented x 3, CNs II-XII intact, decreased sensation to touch in bilateral lower  extremities, strength intact in all extremities      Assessment & Plan:  Please refer to A&P documentation.

## 2015-02-20 ENCOUNTER — Other Ambulatory Visit: Payer: Self-pay | Admitting: Internal Medicine

## 2015-02-20 DIAGNOSIS — G620 Drug-induced polyneuropathy: Secondary | ICD-10-CM

## 2015-02-20 LAB — CBC WITH DIFFERENTIAL/PLATELET
Basophils Absolute: 0 10*3/uL (ref 0.0–0.2)
Basos: 1 %
EOS (ABSOLUTE): 0.1 10*3/uL (ref 0.0–0.4)
Eos: 1 %
Hematocrit: 35.2 % (ref 34.0–46.6)
Hemoglobin: 11.9 g/dL (ref 11.1–15.9)
Immature Grans (Abs): 0 10*3/uL (ref 0.0–0.1)
Immature Granulocytes: 0 %
Lymphocytes Absolute: 1.9 10*3/uL (ref 0.7–3.1)
Lymphs: 22 %
MCH: 33.1 pg — AB (ref 26.6–33.0)
MCHC: 33.8 g/dL (ref 31.5–35.7)
MCV: 98 fL — AB (ref 79–97)
Monocytes Absolute: 0.5 10*3/uL (ref 0.1–0.9)
Monocytes: 6 %
NEUTROS ABS: 6.2 10*3/uL (ref 1.4–7.0)
Neutrophils: 70 %
PLATELETS: 239 10*3/uL (ref 150–379)
RBC: 3.59 x10E6/uL — ABNORMAL LOW (ref 3.77–5.28)
RDW: 15.1 % (ref 12.3–15.4)
WBC: 8.7 10*3/uL (ref 3.4–10.8)

## 2015-02-20 LAB — T-HELPER CELLS (CD4) COUNT (NOT AT ARMC)
CD4 T CELL ABS: 320 /uL — AB (ref 400–2700)
CD4 T CELL HELPER: 17 % — AB (ref 33–55)

## 2015-02-20 LAB — LIPID PANEL
Chol/HDL Ratio: 4.6 ratio units — ABNORMAL HIGH (ref 0.0–4.4)
Cholesterol, Total: 265 mg/dL — ABNORMAL HIGH (ref 100–199)
HDL: 58 mg/dL (ref >39)
Triglycerides: 435 mg/dL — ABNORMAL HIGH (ref 0–149)

## 2015-02-20 LAB — CMP14 + ANION GAP
ALK PHOS: 101 IU/L (ref 39–117)
ALT: 19 IU/L (ref 0–32)
ANION GAP: 18 mmol/L (ref 10.0–18.0)
AST: 21 IU/L (ref 0–40)
Albumin/Globulin Ratio: 1.1 (ref 1.1–2.5)
Albumin: 4.2 g/dL (ref 3.6–4.8)
BUN/Creatinine Ratio: 20 (ref 11–26)
BUN: 33 mg/dL — ABNORMAL HIGH (ref 8–27)
Bilirubin Total: <0.2 mg/dL (ref 0.0–1.2)
CALCIUM: 8.8 mg/dL (ref 8.7–10.3)
CO2: 13 mmol/L — AB (ref 18–29)
Chloride: 109 mmol/L — ABNORMAL HIGH (ref 96–106)
Creatinine, Ser: 1.67 mg/dL — ABNORMAL HIGH (ref 0.57–1.00)
GFR calc Af Amer: 38 mL/min/{1.73_m2} — ABNORMAL LOW (ref >59)
GFR, EST NON AFRICAN AMERICAN: 33 mL/min/{1.73_m2} — AB (ref >59)
GLOBULIN, TOTAL: 3.8 g/dL (ref 1.5–4.5)
Glucose: 95 mg/dL (ref 65–99)
Potassium: 4.8 mmol/L (ref 3.5–5.2)
SODIUM: 140 mmol/L (ref 134–144)
Total Protein: 8 g/dL (ref 6.0–8.5)

## 2015-02-20 LAB — URINE CYTOLOGY ANCILLARY ONLY
Chlamydia: NEGATIVE
Neisseria Gonorrhea: NEGATIVE

## 2015-02-20 LAB — TSH: TSH: 0.772 u[IU]/mL (ref 0.450–4.500)

## 2015-02-20 LAB — RPR: RPR Ser Ql: NONREACTIVE

## 2015-02-20 MED ORDER — SODIUM BICARBONATE 650 MG PO TABS: 650.0000 mg | ORAL_TABLET | Freq: Two times a day (BID) | ORAL | Status: DC

## 2015-02-20 MED ORDER — GABAPENTIN 600 MG PO TABS: 600.0000 mg | ORAL_TABLET | Freq: Three times a day (TID) | ORAL | Status: DC

## 2015-02-20 NOTE — Telephone Encounter (Signed)
Spoke with patient about lab results. We discussed her kidney function has declined and that she will need to be on sodium bicarbonate therapy to correct her acidosis. I also decreased her Gabapentin dose due to her CrCl. She understands not to exceed 600 mg BID.   She states she is still coughing and has a sore throat. Her CXR did not show any obvious infiltrates. I saw Dr. Johnnye Sima prescribed her Azithromycin and recommended for her to complete that therapy. I advised her if she does not feel better in the next 1 week to return to clinic for re-evaluation.

## 2015-02-21 ENCOUNTER — Telehealth: Payer: Self-pay | Admitting: *Deleted

## 2015-02-21 LAB — HIV-1 RNA QUANT-NO REFLEX-BLD
HIV-1 RNA Viral Load Log: 2.204 log10copy/mL
HIV-1 RNA Viral Load: 160 copies/mL

## 2015-02-21 LAB — HCV RNA QUANT: HEPATITIS C QUANTITATION: NOT DETECTED [IU]/mL

## 2015-02-21 NOTE — Telephone Encounter (Signed)
Message left to return call.  Need to schedule ECHO cardiogram for early February.

## 2015-02-24 LAB — TOXASSURE SELECT,+ANTIDEPR,UR: PDF: 0

## 2015-02-26 NOTE — Telephone Encounter (Signed)
Left a second message to call pt to schedule ECHO for when the pt returns from taking care of her mother the second week in February.

## 2015-03-07 ENCOUNTER — Encounter: Payer: Self-pay | Admitting: *Deleted

## 2015-03-25 ENCOUNTER — Ambulatory Visit (HOSPITAL_COMMUNITY): Admission: RE | Admit: 2015-03-25 | Payer: Medicaid Other | Source: Ambulatory Visit

## 2015-03-29 ENCOUNTER — Ambulatory Visit: Payer: Medicaid Other

## 2015-03-29 ENCOUNTER — Telehealth: Payer: Self-pay | Admitting: *Deleted

## 2015-03-29 NOTE — Telephone Encounter (Signed)
Gave the pt the number to call to reschedule her ECHO cardiogram.  Scheduled the pt a new PAP smear appt.

## 2015-04-04 ENCOUNTER — Other Ambulatory Visit: Payer: Self-pay | Admitting: Internal Medicine

## 2015-04-05 ENCOUNTER — Ambulatory Visit: Payer: Medicaid Other

## 2015-04-05 NOTE — Telephone Encounter (Signed)
Called restoril to pharm 

## 2015-04-08 ENCOUNTER — Ambulatory Visit: Payer: Medicaid Other | Admitting: Infectious Diseases

## 2015-05-06 ENCOUNTER — Other Ambulatory Visit: Payer: Self-pay | Admitting: Internal Medicine

## 2015-05-06 ENCOUNTER — Other Ambulatory Visit: Payer: Self-pay | Admitting: Infectious Diseases

## 2015-05-06 DIAGNOSIS — B20 Human immunodeficiency virus [HIV] disease: Secondary | ICD-10-CM

## 2015-05-07 NOTE — Telephone Encounter (Signed)
Pt needs to make f/u appt w/ Dr. Johnnye Sima.

## 2015-05-07 NOTE — Telephone Encounter (Signed)
Will not refill Tramadol because of UDS positive for alcohol and cocaine. Would like her to come back in so we can discuss.

## 2015-05-20 ENCOUNTER — Telehealth: Payer: Self-pay | Admitting: Internal Medicine

## 2015-05-20 NOTE — Telephone Encounter (Signed)
APPT. REMINDER CALL, LMTCB °

## 2015-05-21 ENCOUNTER — Encounter: Payer: Self-pay | Admitting: Internal Medicine

## 2015-05-21 ENCOUNTER — Encounter: Payer: Medicaid Other | Admitting: Internal Medicine

## 2015-05-21 ENCOUNTER — Other Ambulatory Visit: Payer: Self-pay | Admitting: Internal Medicine

## 2015-05-22 ENCOUNTER — Other Ambulatory Visit: Payer: Self-pay | Admitting: Internal Medicine

## 2015-05-23 NOTE — Telephone Encounter (Signed)
UDS positive for alcohol and cocaine. No showed appointment with me this week. Needs to see me in clinic before I will provide opioids again.

## 2015-05-24 ENCOUNTER — Telehealth: Payer: Self-pay | Admitting: *Deleted

## 2015-05-24 ENCOUNTER — Other Ambulatory Visit: Payer: Self-pay | Admitting: Internal Medicine

## 2015-05-24 NOTE — Telephone Encounter (Signed)
Called pt again, she answered, informed her no controlled scripts until seen by dr rivet, gave her appt, she said "she's crazy"

## 2015-06-04 ENCOUNTER — Encounter: Payer: Self-pay | Admitting: Internal Medicine

## 2015-06-04 ENCOUNTER — Ambulatory Visit (INDEPENDENT_AMBULATORY_CARE_PROVIDER_SITE_OTHER): Payer: Medicaid Other | Admitting: Internal Medicine

## 2015-06-04 ENCOUNTER — Telehealth: Payer: Self-pay | Admitting: *Deleted

## 2015-06-04 VITALS — BP 125/71 | HR 86 | Temp 98.5°F | Ht 63.0 in | Wt 187.2 lb

## 2015-06-04 DIAGNOSIS — M549 Dorsalgia, unspecified: Principal | ICD-10-CM

## 2015-06-04 DIAGNOSIS — F1020 Alcohol dependence, uncomplicated: Secondary | ICD-10-CM | POA: Diagnosis not present

## 2015-06-04 DIAGNOSIS — G620 Drug-induced polyneuropathy: Secondary | ICD-10-CM

## 2015-06-04 DIAGNOSIS — I129 Hypertensive chronic kidney disease with stage 1 through stage 4 chronic kidney disease, or unspecified chronic kidney disease: Secondary | ICD-10-CM | POA: Diagnosis not present

## 2015-06-04 DIAGNOSIS — F1420 Cocaine dependence, uncomplicated: Secondary | ICD-10-CM | POA: Diagnosis not present

## 2015-06-04 DIAGNOSIS — G8929 Other chronic pain: Secondary | ICD-10-CM | POA: Diagnosis not present

## 2015-06-04 DIAGNOSIS — Z21 Asymptomatic human immunodeficiency virus [HIV] infection status: Secondary | ICD-10-CM | POA: Diagnosis present

## 2015-06-04 DIAGNOSIS — M545 Low back pain: Secondary | ICD-10-CM

## 2015-06-04 DIAGNOSIS — I10 Essential (primary) hypertension: Secondary | ICD-10-CM

## 2015-06-04 DIAGNOSIS — B20 Human immunodeficiency virus [HIV] disease: Secondary | ICD-10-CM

## 2015-06-04 DIAGNOSIS — G629 Polyneuropathy, unspecified: Secondary | ICD-10-CM | POA: Diagnosis not present

## 2015-06-04 DIAGNOSIS — N189 Chronic kidney disease, unspecified: Secondary | ICD-10-CM

## 2015-06-04 MED ORDER — GABAPENTIN 300 MG PO CAPS: 600.0000 mg | ORAL_CAPSULE | Freq: Two times a day (BID) | ORAL | Status: DC

## 2015-06-04 MED ORDER — TRAMADOL HCL 50 MG PO TABS: ORAL_TABLET | ORAL | Status: DC

## 2015-06-04 NOTE — Patient Instructions (Addendum)
-   Can use Tramadol 50 mg once daily - Cannot ask for early refill - Must go to physical therapy - Must give urine sample for urine drug screen when asked  - Must cut down on alcohol use - NO cocaine or other substance use - Take Gabapentin 600 mg twice daily - Do not take ibuprofen, aleve, etc - Follow up in 1 month  General Instructions:   Please bring your medicines with you each time you come to clinic.  Medicines may include prescription medications, over-the-counter medications, herbal remedies, eye drops, vitamins, or other pills.   Progress Toward Treatment Goals:  Treatment Goal 02/21/2014  Blood pressure at goal  Prevent falls unable to assess    Self Care Goals & Plans:  Self Care Goal 02/21/2014  Manage my medications take my medicines as prescribed; bring my medications to every visit; refill my medications on time  Monitor my health -  Eat healthy foods drink diet soda or water instead of juice or soda; eat more vegetables; eat foods that are low in salt; eat baked foods instead of fried foods; eat fruit for snacks and desserts  Be physically active -  Meeting treatment goals -    No flowsheet data found.   Care Management & Community Referrals:  Referral 02/21/2014  Referrals made for care management support none needed  Referrals made to community resources -

## 2015-06-04 NOTE — Telephone Encounter (Signed)
Tramadol one daily #30 with no refills called into [facility] per MD request.Sharon Panik Cassady4/18/20173:34 PM

## 2015-06-05 LAB — CMP14 + ANION GAP
A/G RATIO: 1.2 (ref 1.2–2.2)
ALK PHOS: 107 IU/L (ref 39–117)
ALT: 20 IU/L (ref 0–32)
AST: 28 IU/L (ref 0–40)
Albumin: 4.4 g/dL (ref 3.6–4.8)
Anion Gap: 19 mmol/L — ABNORMAL HIGH (ref 10.0–18.0)
BUN/Creatinine Ratio: 22 (ref 12–28)
BUN: 36 mg/dL — ABNORMAL HIGH (ref 8–27)
CALCIUM: 9.7 mg/dL (ref 8.7–10.3)
CHLORIDE: 105 mmol/L (ref 96–106)
CO2: 15 mmol/L — ABNORMAL LOW (ref 18–29)
Creatinine, Ser: 1.64 mg/dL — ABNORMAL HIGH (ref 0.57–1.00)
GFR calc Af Amer: 39 mL/min/{1.73_m2} — ABNORMAL LOW (ref >59)
GFR, EST NON AFRICAN AMERICAN: 34 mL/min/{1.73_m2} — AB (ref >59)
GLOBULIN, TOTAL: 3.8 g/dL (ref 1.5–4.5)
Glucose: 99 mg/dL (ref 65–99)
POTASSIUM: 4.4 mmol/L (ref 3.5–5.2)
SODIUM: 139 mmol/L (ref 134–144)
Total Protein: 8.2 g/dL (ref 6.0–8.5)

## 2015-06-08 NOTE — Assessment & Plan Note (Signed)
Well controlled. Continue HCTZ and Lisinopril.

## 2015-06-08 NOTE — Assessment & Plan Note (Signed)
Managing her peripheral neuropathy and chronic back pain is a complicated issue with her illicit substance use and limitations on medications that can be used with her chronic kidney disease. We had a long discussion that she cannot use any illicit substances while taking Tramadol because this is not safe. Her pain is severe based on her exam and I cannot use NSAIDs with her CKD. I explained to her that I am willing to give her a second chance with tramadol but with the following rules: - She must be willing to stop all cocaine and alcohol use - Agree to random UDS - Must participate in PT - Cannot ask for early refills - take medications as directed  She has agreed to these terms and understands if she does not meet all these criteria that I will no longer prescribe Tramadol to her. I am also going to check a bmet to evaluate her kidney function as she has taken extreme doses of both gabapentin and ibuprofen.  - Given 1 prescription for tramadol 50 mg once daily as needed #30 pills, no refills - Follow up in 1 month - Decrease Gabapentin to 600 mg BID - check bmet

## 2015-06-08 NOTE — Progress Notes (Signed)
   Subjective:    Patient ID: Sharon Norris, female    DOB: [birth year], 60 y.o.   MRN: [id number]  HPI Ms. Istvan is a 60yo woman with PMHx of HTN, HIV, peripheral neuropathy, and chronic back pain who presents today for follow up of her peripheral neuropathy.  Peripheral Neuropathy: She reports her peripheral neuropathy pain is severely uncontrolled. She states she is hurting so bad that she is having difficulty walking. She cannot feel her feet at baseline. She has self-increased her Gabapentin to 1200 mg TID. She has also been taking ibuprofen 800 mg, 3-4 pills, twice daily. She reports this is affecting her every day life, unable to do errands and get things done around the house.   Chronic Back Pain: She reports in addition to her peripheral neuropathy, her back pain is also severe as well. She describes the pain as sharp and radiating down from her lower back to the back of both of her legs. As above, this has affected her walking. She had been prescribed Tramadol 50 mg TID as needed at her last visit, but then was found to be cocaine and alcohol positive on her UDS. I did not prescribe her additional Tramadol until she could see me again in clinic. We discussed her UDS results and she admits she has been "self-medicating" with alcohol and cocaine because her pain is so uncontrolled. She admits to drinking up to 6 beers daily, although not every day. She last used cocaine about 1 month ago.   HTN: BP well controlled at 125/71 today. She takes Lisinopril 20 mg daily and HCTZ 12.5 mg daily.   HIV: Last labs on 02/19/15 show VL 160 and CD4 320. She no showed her last appointment with Dr. Johnnye Sima on 2/10 and again on 2/20. She reports she is taking her antivirals.    Review of Systems General: Denies fever, chills, night sweats, changes in weight, changes in appetite HEENT: Denies headaches, ear pain, changes in vision, rhinorrhea, sore throat CV: Denies CP, palpitations, SOB, orthopnea Pulm:  Denies SOB, cough, wheezing GI: Denies abdominal pain, nausea, vomiting, diarrhea, constipation, melena, hematochezia GU: Denies dysuria, hematuria, frequency Msk: Denies muscle cramps Neuro: See HPI Skin: Denies rashes, bruising Psych: Denies hallucinations    Objective:   Physical Exam General: alert, appears uncomfortable. Has difficulty getting on and off of exam table.  HEENT: [place]/AT, EOMI, sclera anicteric, mucus membranes moist CV: RRR, no m/g/r Pulm: CTA bilaterally, breaths non-labored  Abd: BS+, soft, obese, non-tender Ext: warm, no peripheral edema Neuro: alert and oriented x 3. Tenderness elicited to palpation of her lower back and spine. Straight leg test positive on both legs. Her gait is abnormal, with slow steps and restricted movement.     Assessment & Plan:  Please refer to A&P documentation.

## 2015-06-08 NOTE — Assessment & Plan Note (Signed)
She needs to follow up with Dr. Johnnye Sima. I will send a message to his office staff to get her an appointment soon. I am worried that she may not be compliant with her antivirals based on her last set of labs.

## 2015-06-09 NOTE — Progress Notes (Signed)
Case discussed with Dr. Arcelia Jew at the time of the visit.  We reviewed the resident's history and exam and pertinent patient test results.  I agree with the assessment, diagnosis and plan of care documented in the resident's note.

## 2015-06-24 ENCOUNTER — Ambulatory Visit: Payer: Medicaid Other | Attending: Internal Medicine

## 2015-07-01 ENCOUNTER — Ambulatory Visit: Payer: Medicaid Other | Admitting: Internal Medicine

## 2015-07-04 ENCOUNTER — Other Ambulatory Visit: Payer: Self-pay | Admitting: Internal Medicine

## 2015-07-04 ENCOUNTER — Other Ambulatory Visit: Payer: Self-pay | Admitting: Infectious Diseases

## 2015-07-11 ENCOUNTER — Other Ambulatory Visit: Payer: Self-pay | Admitting: Internal Medicine

## 2015-07-16 ENCOUNTER — Other Ambulatory Visit: Payer: Self-pay | Admitting: Internal Medicine

## 2015-07-17 NOTE — Telephone Encounter (Signed)
Last appt 4/18. Next appt 6/19.

## 2015-07-18 IMAGING — CR DG CHEST 2V
2 series · 2 of 2 positions shown · non-contrast
Comparison: May 09, 2012

CLINICAL DATA: Chest pain

EXAM:
CHEST  2 VIEW

[w chest pa]
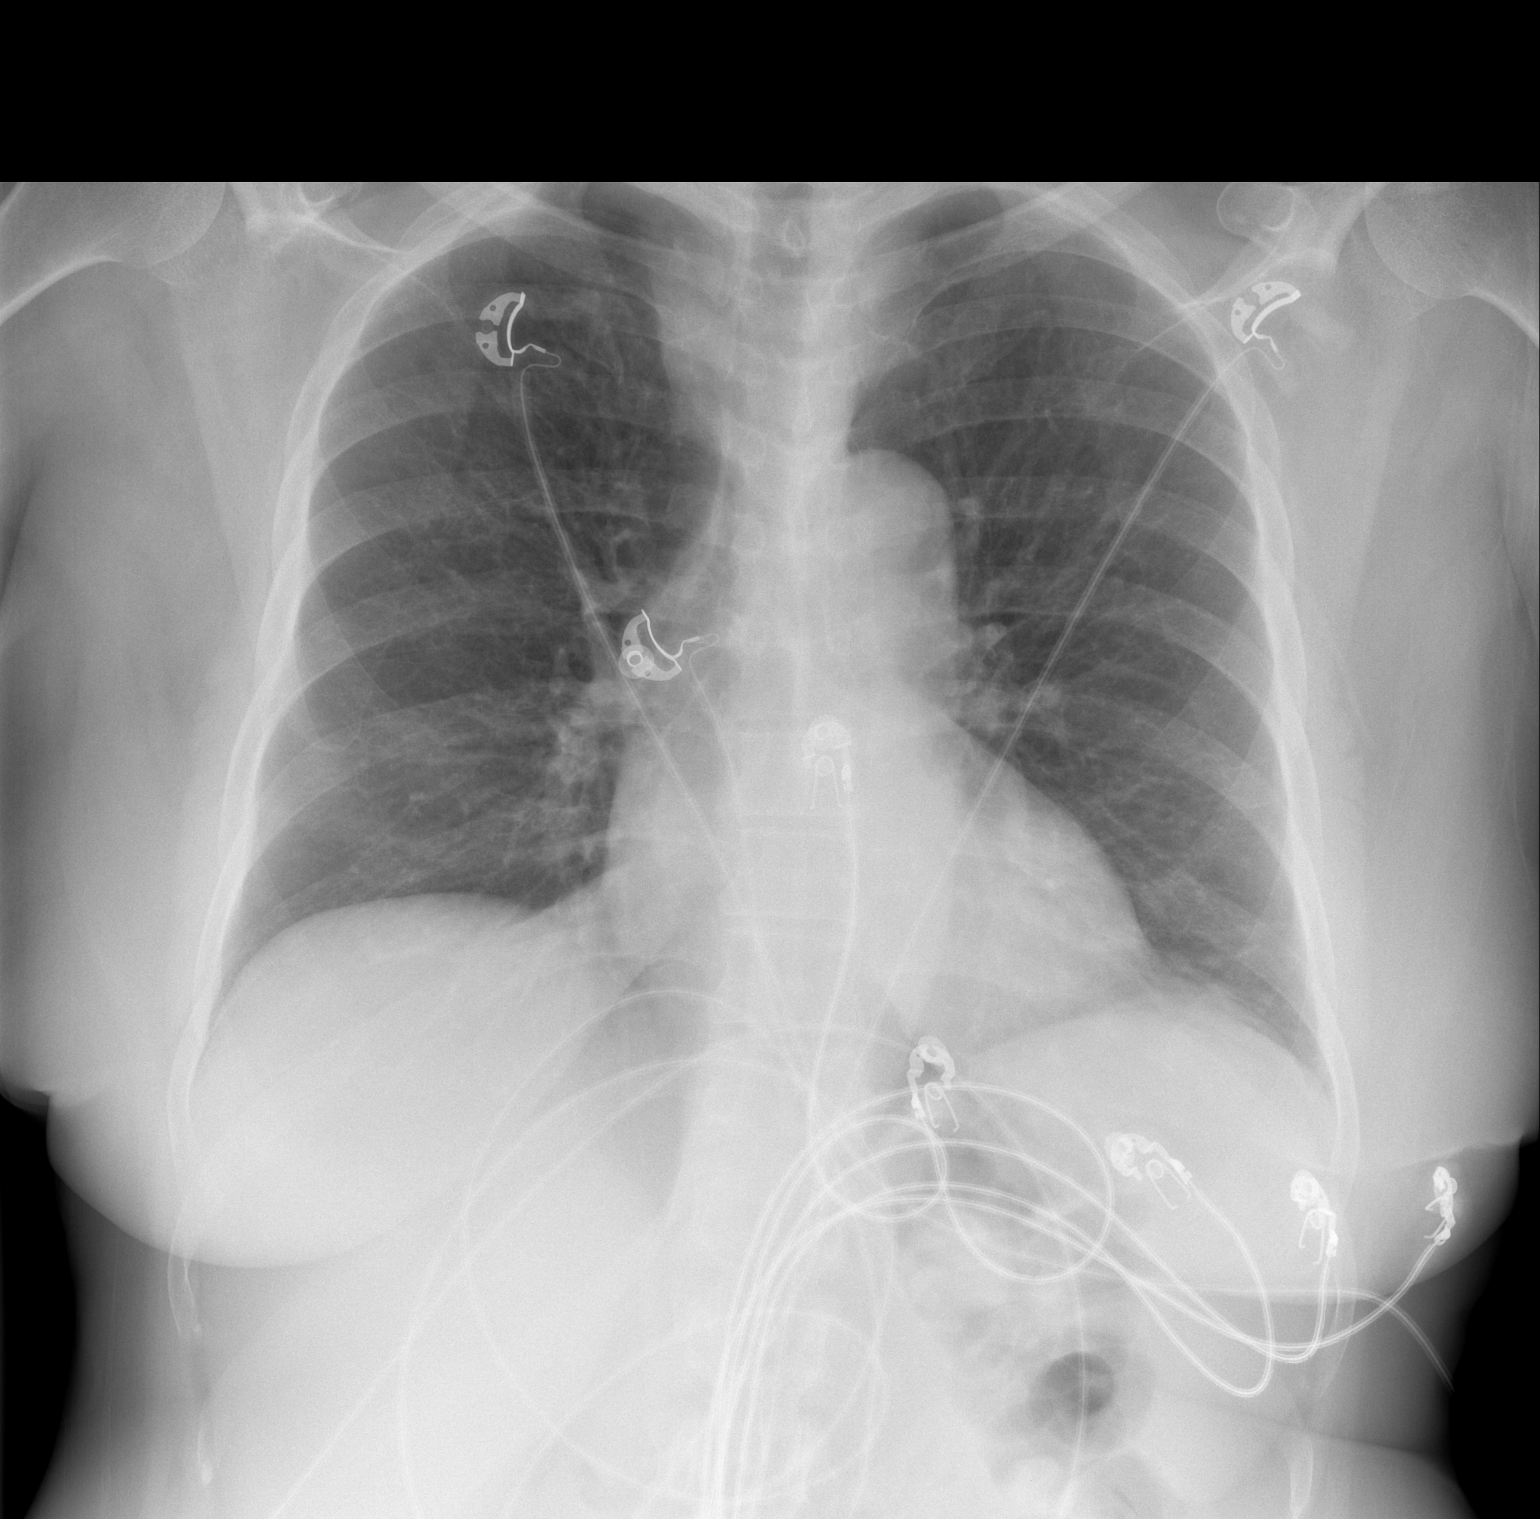

[w chest lat]
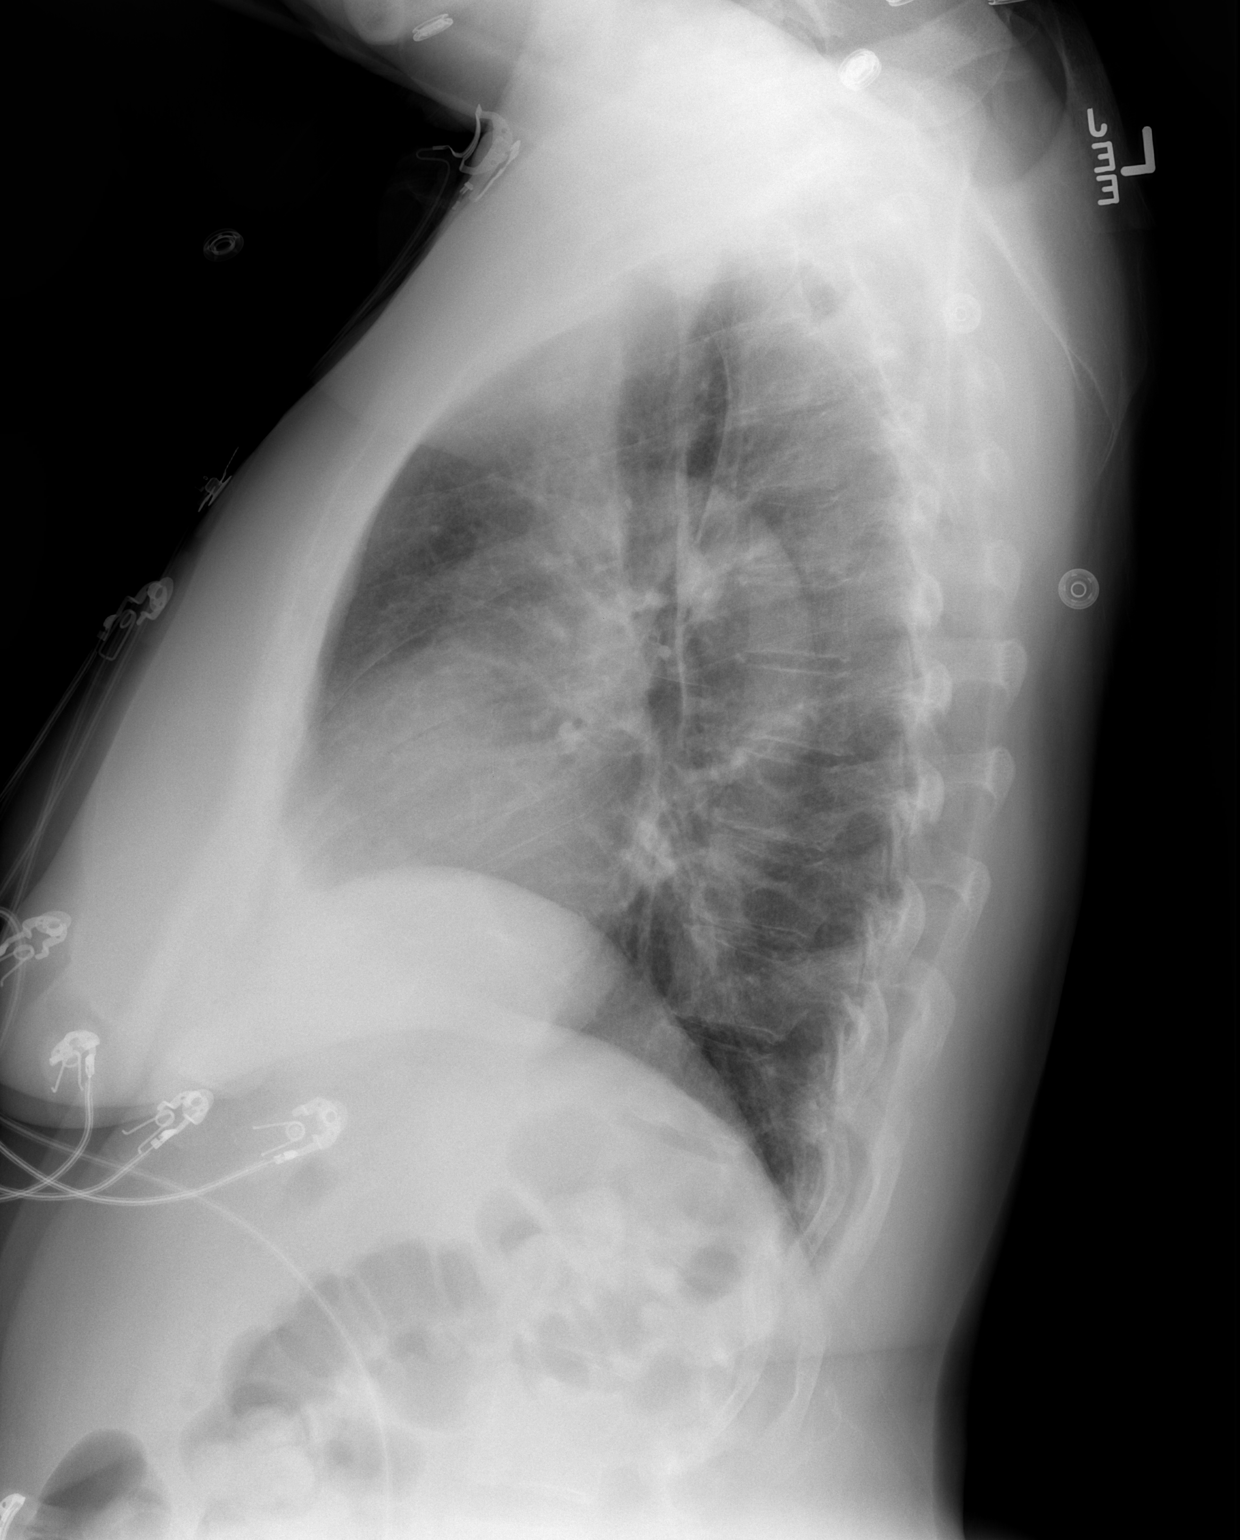

[2 of 2 positions shown; findings below may reference images not displayed]

FINDINGS: There is no edema or consolidation. The heart size and pulmonary
vascularity are normal. No adenopathy. No pneumothorax. No bone
lesions.
IMPRESSION: No edema or consolidation.

## 2015-08-05 ENCOUNTER — Encounter: Payer: Self-pay | Admitting: Internal Medicine

## 2015-08-05 ENCOUNTER — Ambulatory Visit: Payer: Medicaid Other | Admitting: Internal Medicine

## 2015-09-09 ENCOUNTER — Other Ambulatory Visit: Payer: Self-pay | Admitting: Internal Medicine

## 2015-09-09 ENCOUNTER — Other Ambulatory Visit: Payer: Self-pay | Admitting: Infectious Disease

## 2015-09-10 ENCOUNTER — Other Ambulatory Visit: Payer: Self-pay | Admitting: Internal Medicine

## 2015-09-10 NOTE — Telephone Encounter (Signed)
Patient needs an appointment. I told her she needs to follow up at [facility] for her Depakote prescriptions. I will refill this time but she needs to get future refills from [facility].

## 2015-09-12 NOTE — Telephone Encounter (Signed)
Called to pharmacy 

## 2015-09-20 ENCOUNTER — Telehealth: Payer: Self-pay | Admitting: *Deleted

## 2015-09-20 NOTE — Telephone Encounter (Signed)
Left message.  Needing  To make MD, Lab and PAP smear appointments

## 2015-10-07 ENCOUNTER — Other Ambulatory Visit: Payer: Self-pay | Admitting: Internal Medicine

## 2015-11-21 ENCOUNTER — Other Ambulatory Visit: Payer: Self-pay | Admitting: Internal Medicine

## 2015-11-21 MED ORDER — TEMAZEPAM 15 MG PO CAPS: 15.0000 mg | ORAL_CAPSULE | Freq: Every evening | ORAL | 0 refills | Status: DC | PRN

## 2015-11-21 NOTE — Telephone Encounter (Signed)
REFILL REQUEST FOR    temazepam (RESTORIL) 15 MG capsule

## 2015-11-29 ENCOUNTER — Other Ambulatory Visit: Payer: Self-pay | Admitting: Internal Medicine

## 2015-12-05 ENCOUNTER — Other Ambulatory Visit: Payer: Self-pay | Admitting: Internal Medicine

## 2015-12-05 NOTE — Telephone Encounter (Signed)
I have asked her to refill these through [facility] in past. Will discuss at next PCP visit.

## 2015-12-05 NOTE — Telephone Encounter (Signed)
appt  02/04/16 with pcp

## 2015-12-25 ENCOUNTER — Ambulatory Visit: Payer: Medicaid Other | Admitting: Infectious Diseases

## 2015-12-30 ENCOUNTER — Other Ambulatory Visit: Payer: Self-pay | Admitting: Infectious Diseases

## 2015-12-30 ENCOUNTER — Other Ambulatory Visit: Payer: Self-pay | Admitting: Internal Medicine

## 2015-12-31 ENCOUNTER — Other Ambulatory Visit: Payer: Self-pay | Admitting: Internal Medicine

## 2016-01-22 ENCOUNTER — Other Ambulatory Visit: Payer: Self-pay | Admitting: Internal Medicine

## 2016-01-23 NOTE — Telephone Encounter (Signed)
Temazepam rx called to [facility].

## 2016-01-29 ENCOUNTER — Other Ambulatory Visit: Payer: Self-pay | Admitting: Internal Medicine

## 2016-02-03 ENCOUNTER — Other Ambulatory Visit: Payer: Self-pay | Admitting: Internal Medicine

## 2016-02-03 ENCOUNTER — Telehealth: Payer: Self-pay | Admitting: Internal Medicine

## 2016-02-03 NOTE — Telephone Encounter (Signed)
APT. REMINDER CALL, LMTCB °

## 2016-02-04 ENCOUNTER — Encounter: Payer: Medicaid Other | Admitting: Internal Medicine

## 2016-02-19 ENCOUNTER — Ambulatory Visit: Payer: Medicaid Other | Admitting: Infectious Diseases

## 2016-03-02 ENCOUNTER — Other Ambulatory Visit: Payer: Self-pay | Admitting: Internal Medicine

## 2016-03-10 ENCOUNTER — Other Ambulatory Visit: Payer: Self-pay | Admitting: Internal Medicine

## 2016-03-10 ENCOUNTER — Telehealth: Payer: Self-pay | Admitting: Internal Medicine

## 2016-03-10 NOTE — Telephone Encounter (Signed)
Called in temazepam to [facility].

## 2016-03-10 NOTE — Telephone Encounter (Signed)
temazepam (RESTORIL) 15 MG capsule [facility]

## 2016-03-31 ENCOUNTER — Encounter: Payer: Medicaid Other | Admitting: Internal Medicine

## 2016-04-02 ENCOUNTER — Other Ambulatory Visit: Payer: Self-pay | Admitting: Infectious Diseases

## 2016-04-02 ENCOUNTER — Telehealth: Payer: Self-pay | Admitting: Internal Medicine

## 2016-04-02 DIAGNOSIS — B20 Human immunodeficiency virus [HIV] disease: Secondary | ICD-10-CM

## 2016-04-08 ENCOUNTER — Ambulatory Visit (INDEPENDENT_AMBULATORY_CARE_PROVIDER_SITE_OTHER): Payer: Medicaid Other | Admitting: Infectious Diseases

## 2016-04-08 ENCOUNTER — Encounter: Payer: Self-pay | Admitting: Infectious Diseases

## 2016-04-08 VITALS — BP 118/74 | HR 65 | Temp 98.1°F | Ht 63.0 in | Wt 186.0 lb

## 2016-04-08 DIAGNOSIS — K089 Disorder of teeth and supporting structures, unspecified: Secondary | ICD-10-CM | POA: Diagnosis not present

## 2016-04-08 DIAGNOSIS — I10 Essential (primary) hypertension: Secondary | ICD-10-CM | POA: Diagnosis not present

## 2016-04-08 DIAGNOSIS — F5104 Psychophysiologic insomnia: Secondary | ICD-10-CM | POA: Diagnosis not present

## 2016-04-08 DIAGNOSIS — G8929 Other chronic pain: Secondary | ICD-10-CM | POA: Diagnosis not present

## 2016-04-08 DIAGNOSIS — Z113 Encounter for screening for infections with a predominantly sexual mode of transmission: Secondary | ICD-10-CM

## 2016-04-08 DIAGNOSIS — B182 Chronic viral hepatitis C: Secondary | ICD-10-CM

## 2016-04-08 DIAGNOSIS — M549 Dorsalgia, unspecified: Secondary | ICD-10-CM

## 2016-04-08 DIAGNOSIS — Z79899 Other long term (current) drug therapy: Secondary | ICD-10-CM | POA: Diagnosis not present

## 2016-04-08 DIAGNOSIS — B20 Human immunodeficiency virus [HIV] disease: Secondary | ICD-10-CM | POA: Diagnosis not present

## 2016-04-08 LAB — CBC
HCT: 35.8 % (ref 35.0–45.0)
HEMOGLOBIN: 12.1 g/dL (ref 11.7–15.5)
MCH: 32.8 pg (ref 27.0–33.0)
MCHC: 33.8 g/dL (ref 32.0–36.0)
MCV: 97 fL (ref 80.0–100.0)
MPV: 10.6 fL (ref 7.5–12.5)
PLATELETS: 241 10*3/uL (ref 140–400)
RBC: 3.69 MIL/uL — AB (ref 3.80–5.10)
RDW: 13.9 % (ref 11.0–15.0)
WBC: 8.2 10*3/uL (ref 3.8–10.8)

## 2016-04-08 LAB — COMPREHENSIVE METABOLIC PANEL
ALBUMIN: 3.8 g/dL (ref 3.6–5.1)
ALT: 24 U/L (ref 6–29)
AST: 19 U/L (ref 10–35)
Alkaline Phosphatase: 94 U/L (ref 33–130)
BILIRUBIN TOTAL: 0.2 mg/dL (ref 0.2–1.2)
BUN: 31 mg/dL — ABNORMAL HIGH (ref 7–25)
CO2: 15 mmol/L — AB (ref 20–31)
Calcium: 9.2 mg/dL (ref 8.6–10.4)
Chloride: 111 mmol/L — ABNORMAL HIGH (ref 98–110)
Creat: 1.82 mg/dL — ABNORMAL HIGH (ref 0.50–0.99)
GLUCOSE: 93 mg/dL (ref 65–99)
Potassium: 4.4 mmol/L (ref 3.5–5.3)
SODIUM: 138 mmol/L (ref 135–146)
Total Protein: 7.6 g/dL (ref 6.1–8.1)

## 2016-04-08 LAB — LIPID PANEL
CHOLESTEROL: 228 mg/dL — AB (ref <200)
HDL: 47 mg/dL — ABNORMAL LOW (ref >50)
LDL Cholesterol: 145 mg/dL — ABNORMAL HIGH (ref <100)
Total CHOL/HDL Ratio: 4.9 Ratio (ref <5.0)
Triglycerides: 181 mg/dL — ABNORMAL HIGH (ref <150)
VLDL: 36 mg/dL — AB (ref <30)

## 2016-04-08 MED ORDER — TEMAZEPAM 15 MG PO CAPS: ORAL_CAPSULE | ORAL | 1 refills | Status: DC

## 2016-04-08 NOTE — Assessment & Plan Note (Signed)
Will check her Hep C VL

## 2016-04-08 NOTE — Progress Notes (Signed)
   Subjective:    Patient ID: Sharon Norris, female    DOB: 1955-08-14, 61 y.o.   MRN: [id number]  HPI 61 yo F with hx of HIV+, HTN, and hepatitis C.  Started vikera but had allergic reaction. She was then changed to harvoni which she also did not complete.  Has been on triumeq.  States she has been taking her medication faithfully.  Does not like her PCP (has had her sleep meds cut back, pain meds cut off).  Has been taking anti-htn rx.  Still working on courage to go to Pharmacist, community. Starts sweating, gets palpitatons near visit.   HIV 1 RNA Quant (copies/mL)  Date Value  02/28/2014 <20  01/02/2014 53,002 (H)  09/05/2013 <20   HIV-1 RNA Viral Load (copies/mL)  Date Value  02/19/2015 160   CD4 T Cell Abs (/uL)  Date Value  02/19/2015 320 (L)  02/28/2014 380 (L)  01/02/2014 230 (L)    Review of Systems  Constitutional: Negative for appetite change and unexpected weight change.  Respiratory: Negative for shortness of breath.   Cardiovascular: Negative for leg swelling.  Gastrointestinal: Negative for constipation and diarrhea.  Genitourinary: Negative for difficulty urinating and menstrual problem.  Musculoskeletal: Positive for back pain.  Neurological: Positive for numbness.  Psychiatric/Behavioral: Positive for sleep disturbance.  tried melatonin but was unable to stay asleep. Can't quiet her mind. TV, radio are on.  Needs pap, mammo.     Objective:   Physical Exam  Constitutional: She appears well-developed and well-nourished.  HENT:  Mouth/Throat: No oropharyngeal exudate.  Eyes: EOM are normal. Pupils are equal, round, and reactive to light.  Neck: Neck supple.  Cardiovascular: Normal rate, regular rhythm and normal heart sounds.   Pulmonary/Chest: Effort normal and breath sounds normal.  Abdominal: Soft. Bowel sounds are normal. There is no tenderness. There is no rebound.  Musculoskeletal: She exhibits no edema.  Lymphadenopathy:    She has no cervical  adenopathy.      Assessment & Plan:

## 2016-04-08 NOTE — Assessment & Plan Note (Signed)
Will get her pap and mammo Given condoms Will get labs today rtc in 6 months Refuses flu shot.  Needs colon

## 2016-04-08 NOTE — Assessment & Plan Note (Signed)
Will refill her restoril  Suggested trazodone, refused.

## 2016-04-08 NOTE — Assessment & Plan Note (Signed)
Continue OTCs

## 2016-04-08 NOTE — Assessment & Plan Note (Signed)
Doing well on her current rx.  Appreciate Dr Rivett's care of complicated pt.

## 2016-04-08 NOTE — Assessment & Plan Note (Signed)
Try to get her into dental.

## 2016-04-09 ENCOUNTER — Telehealth: Payer: Self-pay | Admitting: Internal Medicine

## 2016-04-09 LAB — RPR

## 2016-04-09 LAB — T-HELPER CELL (CD4) - (RCID CLINIC ONLY)
CD4 % Helper T Cell: 15 % — ABNORMAL LOW (ref 33–55)
CD4 T Cell Abs: 340 /uL — ABNORMAL LOW (ref 400–2700)

## 2016-04-09 NOTE — Telephone Encounter (Signed)
Patient canceled appt with pcp on 03/31/16 due to lack of transportation.  Given next available with pcp on 05-19-16 at 1:45 pm.  Forwarding back to pcp to see if patient needs to be seen sooner in [facility].

## 2016-04-09 NOTE — Telephone Encounter (Signed)
Appointment in April is fine. Thanks!

## 2016-04-11 LAB — HEPATITIS C RNA QUANTITATIVE
HCV QUANT LOG: NOT DETECTED {Log_IU}/mL
HCV QUANT: NOT DETECTED [IU]/mL

## 2016-04-15 LAB — HIV-1 RNA,QN PCR W/REFLEX GENOTYPE
HIV-1 RNA, QN PCR: 1.48 Log cps/mL — ABNORMAL HIGH
HIV-1 RNA, QN PCR: 31 {copies}/mL — AB

## 2016-04-22 ENCOUNTER — Other Ambulatory Visit: Payer: Self-pay | Admitting: Internal Medicine

## 2016-05-07 ENCOUNTER — Other Ambulatory Visit: Payer: Self-pay | Admitting: *Deleted

## 2016-05-07 DIAGNOSIS — F5104 Psychophysiologic insomnia: Secondary | ICD-10-CM

## 2016-05-07 NOTE — Telephone Encounter (Signed)
appt 06/30/16 w/pcp.Despina Hidden Cassady3/22/20184:41 PM

## 2016-05-08 ENCOUNTER — Ambulatory Visit: Payer: Medicaid Other

## 2016-05-12 NOTE — Telephone Encounter (Signed)
Last seen 4/17. No showed several appts since that appt. Dr Arcelia Jew provided refill on temazepam so should be able to call pharmacy to get it. 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

## 2016-05-15 ENCOUNTER — Other Ambulatory Visit: Payer: Self-pay | Admitting: Internal Medicine

## 2016-05-15 DIAGNOSIS — F5104 Psychophysiologic insomnia: Secondary | ICD-10-CM

## 2016-05-18 ENCOUNTER — Telehealth: Payer: Self-pay

## 2016-05-18 NOTE — Telephone Encounter (Signed)
Temazepam called into [facility]

## 2016-05-18 NOTE — Telephone Encounter (Signed)
Patient needs to have depakote filled at [facility]

## 2016-05-19 ENCOUNTER — Encounter: Payer: Medicaid Other | Admitting: Internal Medicine

## 2016-05-28 ENCOUNTER — Other Ambulatory Visit: Payer: Self-pay | Admitting: Internal Medicine

## 2016-05-28 MED ORDER — HYDROCHLOROTHIAZIDE 25 MG PO TABS: 25.0000 mg | ORAL_TABLET | Freq: Every day | ORAL | 0 refills | Status: DC

## 2016-05-29 ENCOUNTER — Other Ambulatory Visit: Payer: Self-pay | Admitting: Internal Medicine

## 2016-05-29 NOTE — Telephone Encounter (Signed)
Patient needs to get these from [facility]. Could we please communicate this to the patient? I have already received 2-3 requests for this that have been denied.

## 2016-05-29 NOTE — Telephone Encounter (Signed)
Called pt - no answer; left message to call [facility] for Depakote refills.

## 2016-06-08 ENCOUNTER — Other Ambulatory Visit: Payer: Self-pay | Admitting: Infectious Diseases

## 2016-06-08 DIAGNOSIS — Z1231 Encounter for screening mammogram for malignant neoplasm of breast: Secondary | ICD-10-CM

## 2016-06-16 ENCOUNTER — Encounter: Payer: Medicaid Other | Admitting: Internal Medicine

## 2016-06-18 ENCOUNTER — Other Ambulatory Visit: Payer: Self-pay | Admitting: Infectious Diseases

## 2016-06-18 ENCOUNTER — Other Ambulatory Visit: Payer: Self-pay | Admitting: Internal Medicine

## 2016-06-18 DIAGNOSIS — F5104 Psychophysiologic insomnia: Secondary | ICD-10-CM

## 2016-06-18 DIAGNOSIS — B20 Human immunodeficiency virus [HIV] disease: Secondary | ICD-10-CM

## 2016-06-22 NOTE — Telephone Encounter (Signed)
Needs appt

## 2016-06-23 ENCOUNTER — Telehealth: Payer: Self-pay

## 2016-06-26 ENCOUNTER — Ambulatory Visit: Payer: Medicaid Other

## 2016-06-29 ENCOUNTER — Telehealth: Payer: Self-pay | Admitting: Internal Medicine

## 2016-06-29 NOTE — Telephone Encounter (Signed)
LMOM for appt with Dr. Arcelia Jew on 06/30/2016.

## 2016-06-30 ENCOUNTER — Encounter: Payer: Medicaid Other | Admitting: Internal Medicine

## 2016-06-30 ENCOUNTER — Encounter: Payer: Self-pay | Admitting: Internal Medicine

## 2016-07-08 ENCOUNTER — Ambulatory Visit: Payer: Medicaid Other

## 2016-12-04 ENCOUNTER — Encounter (HOSPITAL_COMMUNITY): Payer: Self-pay | Admitting: Emergency Medicine

## 2016-12-04 ENCOUNTER — Emergency Department (HOSPITAL_COMMUNITY): Payer: Medicaid Other

## 2016-12-04 ENCOUNTER — Other Ambulatory Visit: Payer: Self-pay

## 2016-12-04 ENCOUNTER — Emergency Department (HOSPITAL_COMMUNITY)
Admission: EM | Admit: 2016-12-04 | Discharge: 2016-12-05 | Disposition: A | Discharge status: Home / Self Care | Payer: Medicaid Other | Attending: Emergency Medicine | Admitting: Emergency Medicine

## 2016-12-04 DIAGNOSIS — B20 Human immunodeficiency virus [HIV] disease: Secondary | ICD-10-CM | POA: Diagnosis not present

## 2016-12-04 DIAGNOSIS — Z87891 Personal history of nicotine dependence: Secondary | ICD-10-CM | POA: Diagnosis not present

## 2016-12-04 DIAGNOSIS — I10 Essential (primary) hypertension: Secondary | ICD-10-CM | POA: Insufficient documentation

## 2016-12-04 DIAGNOSIS — E872 Acidosis, unspecified: Secondary | ICD-10-CM

## 2016-12-04 DIAGNOSIS — R079 Chest pain, unspecified: Secondary | ICD-10-CM | POA: Diagnosis present

## 2016-12-04 DIAGNOSIS — Z79899 Other long term (current) drug therapy: Secondary | ICD-10-CM | POA: Diagnosis not present

## 2016-12-04 LAB — CBC
HCT: 38.2 % (ref 36.0–46.0)
Hemoglobin: 12.8 g/dL (ref 12.0–15.0)
MCH: 33.2 pg (ref 26.0–34.0)
MCHC: 33.5 g/dL (ref 30.0–36.0)
MCV: 99.2 fL (ref 78.0–100.0)
PLATELETS: 210 10*3/uL (ref 150–400)
RBC: 3.85 MIL/uL — AB (ref 3.87–5.11)
RDW: 14 % (ref 11.5–15.5)
WBC: 9.2 10*3/uL (ref 4.0–10.5)

## 2016-12-04 LAB — BASIC METABOLIC PANEL
Anion gap: 8 (ref 5–15)
BUN: 31 mg/dL — AB (ref 6–20)
CO2: 17 mmol/L — ABNORMAL LOW (ref 22–32)
CREATININE: 1.71 mg/dL — AB (ref 0.44–1.00)
Calcium: 9 mg/dL (ref 8.9–10.3)
Chloride: 113 mmol/L — ABNORMAL HIGH (ref 101–111)
GFR, EST AFRICAN AMERICAN: 36 mL/min — AB (ref >60)
GFR, EST NON AFRICAN AMERICAN: 31 mL/min — AB (ref >60)
Glucose, Bld: 107 mg/dL — ABNORMAL HIGH (ref 65–99)
POTASSIUM: 4.3 mmol/L (ref 3.5–5.1)
SODIUM: 138 mmol/L (ref 135–145)

## 2016-12-04 LAB — I-STAT TROPONIN, ED: Troponin i, poc: 0 ng/mL (ref 0.00–0.08)

## 2016-12-04 MED ORDER — NITROGLYCERIN 0.4 MG SL SUBL
0.4000 mg | SUBLINGUAL_TABLET | SUBLINGUAL | Status: DC | PRN
  Administered 2016-12-04: 0.4 mg via SUBLINGUAL
  Filled 2016-12-04: qty 1

## 2016-12-04 MED ORDER — ASPIRIN 81 MG PO CHEW
324.0000 mg | CHEWABLE_TABLET | Freq: Once | ORAL | Status: AC
  Administered 2016-12-04: 324 mg via ORAL
  Filled 2016-12-04: qty 4

## 2016-12-04 NOTE — ED Notes (Signed)
Pt complains of chest pain that started yesterday. Pt states she feels as though something is sitting on her chest.

## 2016-12-04 NOTE — ED Provider Notes (Signed)
[place] EMERGENCY DEPARTMENT Provider Note   CSN: [id number] Arrival date & time: 12/04/16  1341     History   Chief Complaint Chief Complaint  Patient presents with  . Chest Pain    HPI Sharon Norris is a 61 y.o. female.  The history is provided by the patient.  She has a history of HIV infection and hypertension.  For the last 5 days, she has been having episodes of heaviness in her chest like somebody is sitting on her chest.  There is associated dyspnea, nausea, diaphoresis.  Symptoms will last for about an hour before subsiding.  There is no relation to exertion or position or eating.  Nothing makes it better, nothing makes it worse.  She rates her discomfort at 6/10.  Symptoms have been getting somewhat worse as the week has progressed.  She has never had episodes like this before.  She has a history of hypertension but no history of diabetes or hyperlipidemia.  She is a non-smoker.  There is no family history of coronary artery disease.  She has not taken anything at home to treat this.  She states that she is currently having discomfort.  Past Medical History:  Diagnosis Date  . Anxiety   . Bronchitis   . Depression   . 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 Hepatitis    Hep. C  . History of suicidal ideation    thought about overdose in the past  . HIV infection ([place])   . Hypertension   . Neuropathy   . Sleep difficulties   . Trichomonal infection    2009    Patient Active Problem List   Diagnosis Date Noted  . Poor dentition 04/08/2016  . Health care maintenance 09/05/2013  . Psychiatric disorder 09/17/2010  . Peripheral neuropathy ([place]) 04/02/2009  . Insomnia 01/15/2009  . Chronic back pain 12/12/2007  . Human immunodeficiency virus (HIV) disease ([place]) 12/23/2005  . Chronic hepatitis C without hepatic coma ([place]) 12/23/2005  . Essential hypertension 12/23/2005    Past Surgical History:  Procedure Laterality Date  .  COLPOSCOPY  2010    OB History    Gravida Para Term Preterm AB Living   3 2 2   1      SAB TAB Ectopic Multiple Live Births     1             Home Medications    Prior to Admission medications   Medication Sig Start Date End Date Taking? Authorizing Provider  diphenhydramine-acetaminophen (TYLENOL PM EXTRA STRENGTH) 25-500 MG TABS Take 1 tablet by mouth at bedtime as needed.   Yes [provider]  divalproex (DEPAKOTE ER) 500 MG 24 hr tablet TAKE 1 TABLET (500 MG TOTAL) BY MOUTH EVERY MORNING. 01/29/16  Yes Rivet, Carly J, MD  divalproex (DEPAKOTE) 250 MG DR tablet TAKE 1 TABLET (250 MG TOTAL) BY MOUTH AT BEDTIME. 01/29/16  Yes Rivet, Carly J, MD  gabapentin (NEURONTIN) 300 MG capsule TAKE TWO CAPSULES (600 MG TOTAL) BY MOUTH TWO TIMES DAILY. 06/22/16  Yes Rivet, Sindy Guadeloupe, MD  hydrochlorothiazide (HYDRODIURIL) 25 MG tablet Take 1 tablet (25 mg total) by mouth daily. 05/28/16  Yes Rivet, Sindy Guadeloupe, MD  hydrOXYzine (ATARAX/VISTARIL) 25 MG tablet TAKE 1 TABLET (25 MG TOTAL) BY MOUTH THREE TIMES DAILY AS NEEDED. 08/10/14  Yes Campbell Riches, MD  Ibuprofen 200 MG CAPS Take 2-3 capsules (400-600 mg total) by mouth 3 (  three) times daily as needed (for chest wall pain- DO NOT TAKE FOR MORE THAN 7 DAYS IN A ROW). 05/26/13  Yes Samella Parr, NP  lisinopril (PRINIVIL,ZESTRIL) 20 MG tablet TAKE 1 TABLET (20 MG TOTAL) BY MOUTH DAILY. 04/22/16  Yes Rivet, Carly J, MD  sodium bicarbonate 650 MG tablet TAKE 1 TABLET (650 MG TOTAL) BY MOUTH TWO   (TWO) TIMES DAILY. 06/22/16  Yes Rivet, Carly J, MD  temazepam (RESTORIL) 15 MG capsule TAKE 1 CAPSULE BY MOUTH EVERY AT BEDTIME AS NEEDED FOR SLEEP 06/22/16  Yes Rivet, Carly J, MD  TRIUMEQ 600-50-300 MG tablet TAKE 1 TABLET BY MOUTH DAILY. 06/19/16  Yes Campbell Riches, MD    Family History Family History  Problem Relation Age of Onset  . Alcohol abuse Father   . Cancer Maternal Aunt        endometrial  . Cancer Sister        endometrial  . Cancer  Sister        breast    Social History Social History  Substance Use Topics  . Smoking status: Former Smoker    Quit date: 02/17/2000  . Smokeless tobacco: Never Used  . Alcohol use 0.0 oz/week     Comment: beer, occasional     Allergies   Benadryl [diphenhydramine hcl]; Ribavirin; and Viekira pak [ombitasvir-paritaprevir-ritonavir-dasabuvir]   Review of Systems Review of Systems  All other systems reviewed and are negative.    Physical Exam Updated Vital Signs BP (!) 203/82 (BP Location: Right Arm)   Pulse 66   Temp 98.7 F (37.1 C) (Oral)   Resp 14   Ht 5\' 3"  (1.6 m)   Wt 81.6 kg (180 lb)   SpO2 99%   BMI 31.89 kg/m   Physical Exam  Nursing note and vitals reviewed.  61 year old female, resting comfortably and in no acute distress. Vital signs are significant for hypertension. Oxygen saturation is 99%, which is normal. Head is normocephalic and atraumatic. PERRLA, EOMI. Oropharynx is clear. Neck is nontender and supple without adenopathy or JVD. Back is nontender and there is no CVA tenderness. Lungs are clear without rales, wheezes, or rhonchi. Chest is nontender. Heart has regular rate and rhythm without murmur. Abdomen is soft, flat, nontender without masses or hepatosplenomegaly and peristalsis is normoactive. Extremities have no cyanosis or edema, full range of motion is present. Skin is warm and dry without rash. Neurologic: Mental status is normal, cranial nerves are intact, there are no motor or sensory deficits.  ED Treatments / Results  Labs (all labs ordered are listed, but only abnormal results are displayed) Labs Reviewed  BASIC METABOLIC PANEL - Abnormal; Notable for the following:       Result Value   Chloride 113 (*)    CO2 17 (*)    Glucose, Bld 107 (*)    BUN 31 (*)    Creatinine, Ser 1.71 (*)    GFR calc non Af Amer 31 (*)    GFR calc Af Amer 36 (*)    All other components within normal limits  CBC - Abnormal; Notable for the  following:    RBC 3.85 (*)    All other components within normal limits  I-STAT TROPONIN, ED    EKG  EKG Interpretation  Date/Time:  Friday December 04 2016 13:58:05 EDT Ventricular Rate:  83 PR Interval:  164 QRS Duration: 66 QT Interval:  400 QTC Calculation: 470 R Axis:   55 Text Interpretation:  Normal sinus rhythm Biatrial enlargement Nonspecific T wave abnormality Prolonged QT Abnormal ECG When compared with ECG of 09/05/2013, Nonspecific T wave abnormality is now present Confirmed by Delora Fuel ([id number]) on 12/04/2016 10:56:42 PM       Radiology Dg Chest 2 View  Result Date: 12/04/2016 CLINICAL DATA:  Chest pain EXAM: CHEST  2 VIEW COMPARISON:  Chest radiograph 02/19/2015 FINDINGS: The heart size and mediastinal contours are within normal limits. There is left basilar atelectasis. The lungs are otherwise clear. The visualized skeletal structures are unremarkable. IMPRESSION: Left basilar atelectasis without active cardiopulmonary disease. Electronically Signed   By: Ulyses Jarred M.D.   On: 12/04/2016 14:44    Procedures Procedures (including critical care time)  Medications Ordered in ED Medications  nitroGLYCERIN (NITROSTAT) SL tablet 0.4 mg (0.4 mg Sublingual Given 12/04/16 2348)  aspirin chewable tablet 324 mg (324 mg Oral Given 12/04/16 2347)     Initial Impression / Assessment and Plan / ED Course  I have reviewed the triage vital signs and the nursing notes.  Pertinent labs & imaging results that were available during my care of the patient were reviewed by me and considered in my medical decision making (see chart for details).  Chest pain of uncertain cause. Heart Score = 4, which puts her at moderate risk for major adverse cardiac event in the next 30 days.  Old records are reviewed and she has no prior visits for chest pain.  Last CD4 count was in February was 340 which is slightly low.  HIV 1 RNA was 31 copies per milliliter, 1.48 log copies per milliliter.   ECG today shows nonspecific T wave changes which are new compared with 2015, initial troponin normal.  Will repeat troponin and ECG.  She will be given aspirin and nitroglycerin.  She had no relief with nitroglycerin.  She refused to repeat troponin and ECG and wished to leave.  I advised her that I could not discharge her without additional tests, but she could leave [place].  Risks of having a heart attack at home including death and severe disability were discussed.  Patient acknowledged these risks and stated she still wished to go home.  She is referred to cardiology for outpatient workup and advised to return should she change her mind about additional testing.  Final Clinical Impressions(s) / ED Diagnoses   Final diagnoses:  Nonspecific chest pain  Essential hypertension  Metabolic acidosis    New Prescriptions New Prescriptions   No medications on file     Delora Fuel, MD 47/34/03 [id number]

## 2016-12-04 NOTE — ED Notes (Signed)
Pt refusing to have her blood draw. Pt is asking to go home.

## 2016-12-04 NOTE — ED Triage Notes (Signed)
Pt reports L sided CP radiating to back, LUE, LLE, describes as "someone sitting on my chest." Pt endorses n, SOB, lightheadedness, denies diaphoresis.

## 2016-12-04 NOTE — Discharge Instructions (Signed)
Your initial electrocardiogram and blood work did not show a heart attack.  However, a heart attack can still be present.  I wanted to repeat her blood test, but she did not want to have additional blood drawn.  I feel that she should be evaluated in the hospital to make sure you are not having a heart attack.  You are electing to leave against my medical advice.  Risk of having a heart attack at home include death and severe disability.  If you change your mind at any time, please return to the emergency department.  Otherwise, make an appointment with the cardiologist for outpatient evaluation.

## 2017-02-11 ENCOUNTER — Other Ambulatory Visit: Payer: Self-pay | Admitting: Infectious Diseases

## 2017-02-11 DIAGNOSIS — B20 Human immunodeficiency virus [HIV] disease: Secondary | ICD-10-CM

## 2017-02-23 ENCOUNTER — Other Ambulatory Visit: Payer: Self-pay | Admitting: *Deleted

## 2017-02-23 DIAGNOSIS — B20 Human immunodeficiency virus [HIV] disease: Secondary | ICD-10-CM

## 2017-02-23 DIAGNOSIS — Z113 Encounter for screening for infections with a predominantly sexual mode of transmission: Secondary | ICD-10-CM

## 2017-02-23 DIAGNOSIS — Z79899 Other long term (current) drug therapy: Secondary | ICD-10-CM

## 2017-02-25 ENCOUNTER — Other Ambulatory Visit: Payer: Medicaid Other

## 2017-03-04 ENCOUNTER — Other Ambulatory Visit: Payer: Medicaid Other

## 2017-03-04 DIAGNOSIS — Z113 Encounter for screening for infections with a predominantly sexual mode of transmission: Secondary | ICD-10-CM

## 2017-03-04 DIAGNOSIS — Z79899 Other long term (current) drug therapy: Secondary | ICD-10-CM

## 2017-03-04 DIAGNOSIS — B20 Human immunodeficiency virus [HIV] disease: Secondary | ICD-10-CM

## 2017-03-05 LAB — LIPID PANEL
CHOL/HDL RATIO: 3.9 (calc) (ref <5.0)
CHOLESTEROL: 239 mg/dL — AB (ref <200)
HDL: 62 mg/dL (ref >50)
LDL CHOLESTEROL (CALC): 146 mg/dL — AB
NON-HDL CHOLESTEROL (CALC): 177 mg/dL — AB (ref <130)
Triglycerides: 174 mg/dL — ABNORMAL HIGH (ref <150)

## 2017-03-05 LAB — CBC WITH DIFFERENTIAL/PLATELET
BASOS PCT: 0.6 %
Basophils Absolute: 65 cells/uL (ref 0–200)
EOS PCT: 0.6 %
Eosinophils Absolute: 65 cells/uL (ref 15–500)
HCT: 38.1 % (ref 35.0–45.0)
HEMOGLOBIN: 13.2 g/dL (ref 11.7–15.5)
LYMPHS ABS: 2484 {cells}/uL (ref 850–3900)
MCH: 32.9 pg (ref 27.0–33.0)
MCHC: 34.6 g/dL (ref 32.0–36.0)
MCV: 95 fL (ref 80.0–100.0)
MPV: 11.4 fL (ref 7.5–12.5)
Monocytes Relative: 5.8 %
NEUTROS ABS: 7560 {cells}/uL (ref 1500–7800)
Neutrophils Relative %: 70 %
Platelets: 264 10*3/uL (ref 140–400)
RBC: 4.01 10*6/uL (ref 3.80–5.10)
RDW: 13.2 % (ref 11.0–15.0)
Total Lymphocyte: 23 %
WBC mixed population: 626 cells/uL (ref 200–950)
WBC: 10.8 10*3/uL (ref 3.8–10.8)

## 2017-03-05 LAB — COMPREHENSIVE METABOLIC PANEL
AG Ratio: 1.1 (calc) (ref 1.0–2.5)
ALKALINE PHOSPHATASE (APISO): 84 U/L (ref 33–130)
ALT: 15 U/L (ref 6–29)
AST: 19 U/L (ref 10–35)
Albumin: 4.2 g/dL (ref 3.6–5.1)
BUN/Creatinine Ratio: 15 (calc) (ref 6–22)
BUN: 18 mg/dL (ref 7–25)
CALCIUM: 9.4 mg/dL (ref 8.6–10.4)
CO2: 20 mmol/L (ref 20–32)
CREATININE: 1.24 mg/dL — AB (ref 0.50–0.99)
Chloride: 104 mmol/L (ref 98–110)
Globulin: 3.7 g/dL (calc) (ref 1.9–3.7)
Glucose, Bld: 92 mg/dL (ref 65–99)
Potassium: 4.1 mmol/L (ref 3.5–5.3)
Sodium: 136 mmol/L (ref 135–146)
Total Bilirubin: 0.4 mg/dL (ref 0.2–1.2)
Total Protein: 7.9 g/dL (ref 6.1–8.1)

## 2017-03-05 LAB — RPR: RPR Ser Ql: NONREACTIVE

## 2017-03-05 LAB — T-HELPER CELL (CD4) - (RCID CLINIC ONLY)
CD4 % Helper T Cell: 16 % — ABNORMAL LOW (ref 33–55)
CD4 T Cell Abs: 410 /uL (ref 400–2700)

## 2017-03-06 LAB — HIV-1 RNA QUANT-NO REFLEX-BLD
HIV 1 RNA QUANT: 51 {copies}/mL — AB
HIV-1 RNA QUANT, LOG: 1.71 {Log_copies}/mL — AB

## 2017-03-11 ENCOUNTER — Ambulatory Visit: Payer: Medicaid Other | Admitting: Infectious Diseases

## 2017-03-18 ENCOUNTER — Ambulatory Visit: Payer: Medicaid Other | Admitting: Infectious Diseases

## 2017-03-29 ENCOUNTER — Ambulatory Visit: Payer: Medicaid Other | Admitting: Infectious Diseases

## 2017-04-20 ENCOUNTER — Emergency Department (HOSPITAL_COMMUNITY)
Admission: EM | Admit: 2017-04-20 | Discharge: 2017-04-20 | Disposition: A | Discharge status: Home / Self Care | Payer: No Typology Code available for payment source | Attending: Emergency Medicine | Admitting: Emergency Medicine

## 2017-04-20 ENCOUNTER — Emergency Department (HOSPITAL_COMMUNITY): Payer: No Typology Code available for payment source

## 2017-04-20 ENCOUNTER — Encounter (HOSPITAL_COMMUNITY): Payer: Self-pay | Admitting: Emergency Medicine

## 2017-04-20 DIAGNOSIS — S199XXA Unspecified injury of neck, initial encounter: Secondary | ICD-10-CM | POA: Insufficient documentation

## 2017-04-20 DIAGNOSIS — M542 Cervicalgia: Secondary | ICD-10-CM

## 2017-04-20 DIAGNOSIS — I10 Essential (primary) hypertension: Secondary | ICD-10-CM | POA: Insufficient documentation

## 2017-04-20 DIAGNOSIS — Z87891 Personal history of nicotine dependence: Secondary | ICD-10-CM | POA: Insufficient documentation

## 2017-04-20 DIAGNOSIS — Z79899 Other long term (current) drug therapy: Secondary | ICD-10-CM | POA: Insufficient documentation

## 2017-04-20 DIAGNOSIS — Y9389 Activity, other specified: Secondary | ICD-10-CM | POA: Insufficient documentation

## 2017-04-20 DIAGNOSIS — Y999 Unspecified external cause status: Secondary | ICD-10-CM | POA: Insufficient documentation

## 2017-04-20 DIAGNOSIS — B20 Human immunodeficiency virus [HIV] disease: Secondary | ICD-10-CM | POA: Insufficient documentation

## 2017-04-20 DIAGNOSIS — Y92410 Unspecified street and highway as the place of occurrence of the external cause: Secondary | ICD-10-CM | POA: Insufficient documentation

## 2017-04-20 MED ORDER — METHOCARBAMOL 500 MG PO TABS
500.0000 mg | ORAL_TABLET | Freq: Once | ORAL | Status: AC
  Administered 2017-04-20: 500 mg via ORAL
  Filled 2017-04-20: qty 1

## 2017-04-20 MED ORDER — HYDROCHLOROTHIAZIDE 12.5 MG PO CAPS
25.0000 mg | ORAL_CAPSULE | Freq: Once | ORAL | Status: AC
  Administered 2017-04-20: 25 mg via ORAL
  Filled 2017-04-20: qty 2

## 2017-04-20 MED ORDER — OXYCODONE-ACETAMINOPHEN 5-325 MG PO TABS
2.0000 | ORAL_TABLET | Freq: Once | ORAL | Status: AC
Start: 2017-04-20 — End: 2017-04-20
  Administered 2017-04-20: 2 via ORAL
  Filled 2017-04-20: qty 2

## 2017-04-20 MED ORDER — HYDROCODONE-ACETAMINOPHEN 5-325 MG PO TABS: 1.0000 | ORAL_TABLET | Freq: Four times a day (QID) | ORAL | 0 refills | Status: DC | PRN

## 2017-04-20 MED ORDER — LISINOPRIL 20 MG PO TABS
20.0000 mg | ORAL_TABLET | Freq: Once | ORAL | Status: DC
  Filled 2017-04-20: qty 1

## 2017-04-20 MED ORDER — METHOCARBAMOL 500 MG PO TABS: 500.0000 mg | ORAL_TABLET | Freq: Three times a day (TID) | ORAL | 0 refills | Status: AC | PRN

## 2017-04-20 NOTE — ED Notes (Signed)
Patient undressed herself and got into a gown. Belongings were placed in a patient belongings bag.
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
## 2017-04-20 NOTE — Discharge Instructions (Signed)
Wear your collar at all times when upright for at least the next week, until told that you are clear to take it off by a physician.  I recommend following up with your primary care doctor, but if you are unable to do this, you can call the number above.  Return to the ER immediately if you develop any new numbness, weakness, tingling, or difficulty controlling your bowels.
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
## 2017-04-20 NOTE — ED Notes (Signed)
Bed: [facility] Expected date:  Expected time:  Means of arrival:  Comments: 

## 2017-06-29 ENCOUNTER — Other Ambulatory Visit: Payer: Self-pay | Admitting: *Deleted

## 2017-06-29 ENCOUNTER — Encounter: Payer: Self-pay | Admitting: Internal Medicine

## 2017-06-29 ENCOUNTER — Other Ambulatory Visit: Payer: Self-pay

## 2017-06-29 ENCOUNTER — Other Ambulatory Visit: Payer: Medicaid Other

## 2017-06-29 ENCOUNTER — Ambulatory Visit (INDEPENDENT_AMBULATORY_CARE_PROVIDER_SITE_OTHER): Payer: Medicaid Other | Admitting: Internal Medicine

## 2017-06-29 ENCOUNTER — Other Ambulatory Visit (HOSPITAL_COMMUNITY)
Admission: RE | Admit: 2017-06-29 | Discharge: 2017-06-29 | Disposition: A | Discharge status: Home / Self Care | Payer: Medicaid Other | Source: Ambulatory Visit | Attending: Infectious Diseases | Admitting: Infectious Diseases

## 2017-06-29 VITALS — BP 189/78 | HR 72 | Temp 98.2°F | Ht 63.0 in | Wt 174.2 lb

## 2017-06-29 DIAGNOSIS — M549 Dorsalgia, unspecified: Secondary | ICD-10-CM | POA: Diagnosis not present

## 2017-06-29 DIAGNOSIS — J302 Other seasonal allergic rhinitis: Secondary | ICD-10-CM

## 2017-06-29 DIAGNOSIS — R51 Headache: Secondary | ICD-10-CM

## 2017-06-29 DIAGNOSIS — B20 Human immunodeficiency virus [HIV] disease: Secondary | ICD-10-CM

## 2017-06-29 DIAGNOSIS — G8929 Other chronic pain: Secondary | ICD-10-CM | POA: Diagnosis not present

## 2017-06-29 DIAGNOSIS — H6123 Impacted cerumen, bilateral: Secondary | ICD-10-CM | POA: Diagnosis not present

## 2017-06-29 DIAGNOSIS — I10 Essential (primary) hypertension: Secondary | ICD-10-CM

## 2017-06-29 DIAGNOSIS — Z79899 Other long term (current) drug therapy: Secondary | ICD-10-CM

## 2017-06-29 DIAGNOSIS — Z113 Encounter for screening for infections with a predominantly sexual mode of transmission: Secondary | ICD-10-CM | POA: Diagnosis present

## 2017-06-29 DIAGNOSIS — G629 Polyneuropathy, unspecified: Secondary | ICD-10-CM | POA: Diagnosis not present

## 2017-06-29 DIAGNOSIS — Z8659 Personal history of other mental and behavioral disorders: Secondary | ICD-10-CM | POA: Diagnosis not present

## 2017-06-29 DIAGNOSIS — G620 Drug-induced polyneuropathy: Secondary | ICD-10-CM

## 2017-06-29 DIAGNOSIS — F99 Mental disorder, not otherwise specified: Secondary | ICD-10-CM

## 2017-06-29 LAB — CBC WITH DIFFERENTIAL/PLATELET
BASOS ABS: 59 {cells}/uL (ref 0–200)
Basophils Relative: 0.9 %
Eosinophils Absolute: 72 cells/uL (ref 15–500)
Eosinophils Relative: 1.1 %
HEMATOCRIT: 36.7 % (ref 35.0–45.0)
HEMOGLOBIN: 12.8 g/dL (ref 11.7–15.5)
LYMPHS ABS: 2321 {cells}/uL (ref 850–3900)
MCH: 33.7 pg — AB (ref 27.0–33.0)
MCHC: 34.9 g/dL (ref 32.0–36.0)
MCV: 96.6 fL (ref 80.0–100.0)
MONOS PCT: 7.7 %
MPV: 11.7 fL (ref 7.5–12.5)
NEUTROS ABS: 3549 {cells}/uL (ref 1500–7800)
NEUTROS PCT: 54.6 %
Platelets: 219 10*3/uL (ref 140–400)
RBC: 3.8 10*6/uL (ref 3.80–5.10)
RDW: 13.6 % (ref 11.0–15.0)
Total Lymphocyte: 35.7 %
WBC mixed population: 501 cells/uL (ref 200–950)
WBC: 6.5 10*3/uL (ref 3.8–10.8)

## 2017-06-29 LAB — COMPLETE METABOLIC PANEL WITH GFR
AG Ratio: 1.2 (calc) (ref 1.0–2.5)
ALBUMIN MSPROF: 4 g/dL (ref 3.6–5.1)
ALKALINE PHOSPHATASE (APISO): 60 U/L (ref 33–130)
ALT: 12 U/L (ref 6–29)
AST: 18 U/L (ref 10–35)
BUN / CREAT RATIO: 14 (calc) (ref 6–22)
BUN: 21 mg/dL (ref 7–25)
CO2: 21 mmol/L (ref 20–32)
Calcium: 9.2 mg/dL (ref 8.6–10.4)
Chloride: 110 mmol/L (ref 98–110)
Creat: 1.45 mg/dL — ABNORMAL HIGH (ref 0.50–0.99)
GFR, Est African American: 44 mL/min/{1.73_m2} — ABNORMAL LOW (ref >=60)
GFR, Est Non African American: 38 mL/min/{1.73_m2} — ABNORMAL LOW (ref >=60)
GLOBULIN: 3.4 g/dL (ref 1.9–3.7)
Glucose, Bld: 91 mg/dL (ref 65–99)
POTASSIUM: 3.8 mmol/L (ref 3.5–5.3)
SODIUM: 140 mmol/L (ref 135–146)
Total Bilirubin: 0.4 mg/dL (ref 0.2–1.2)
Total Protein: 7.4 g/dL (ref 6.1–8.1)

## 2017-06-29 MED ORDER — GABAPENTIN 300 MG PO CAPS: ORAL_CAPSULE | ORAL | 0 refills | Status: DC

## 2017-06-29 MED ORDER — LISINOPRIL 20 MG PO TABS: 20.0000 mg | ORAL_TABLET | Freq: Every day | ORAL | 0 refills | Status: DC

## 2017-06-29 MED ORDER — HYDROCHLOROTHIAZIDE 25 MG PO TABS: 25.0000 mg | ORAL_TABLET | Freq: Every day | ORAL | 0 refills | Status: DC

## 2017-06-29 NOTE — Progress Notes (Signed)
CC: Visit to establish care with a provider for hypertension  HPI:  [name] is a 62 y.o. female with PMHx detailed below presenting to reestablish follow up with our clinic for hypertension of other chronic medical problems. She was previously followed here but has not been seen since 2017. She attends [facility] and is on Triumeq for controlled HIV disease but otherwise takes no medications. She has chronic back pain that she previously took gabapentin for but is using tylenol as needed currently. She was also previously seeing [facility] for outpatient psychiatry but has been off depakote for a year. She thinks her mood has not been too bad recently.  See problem based assessment and plan below for additional details.  Essential hypertension Blood pressure is uncontrolled today at 189/78 off any medications. She presents no signs of a hypertensive urgency. Today she will restart her previously prescribed lisinopril and HCTZ and need to follow up in about 2 weeks to assess response to treatment. -Start Lisinopril 20mg  daily -Start HCTZ 25mg  daily  Peripheral neuropathy ([facility]) Her sensation seems grossly intact to examination today. She reports a moderate amount of pain but is not debilitated from any usual activities by this. We can try restarting previous treatment for this as she felt the gabapentin was partially effective in the past. Plan: -Start Gabapentin 600mg  BID  Psychiatric disorder She has been off medication since over a year ago  But reports a pretty fair mood, concentration, and energy. She will plan to return for a [facility] in visit at [facility] and decide if her previous medicine needs restarting at this time.  Excessive cerumen in both ear canals There is no evidence of inflammation or significant hearing loss to suggest complications from impaction. She digs in her ears with fingertips and otherwise has not tried any treatment for this. I counseled her on trying to avoid  putting anything in her ears and starting to try with drops. If it does not improve or bothers her more we could offer irrigation Plan: -Start carbamide peroxide eardrops twice daily   Past Medical History:  Diagnosis Date  . Anxiety   . Bronchitis   . Depression   . 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 Hepatitis    Hep. C  . History of suicidal ideation    thought about overdose in the past  . HIV infection ([place])   . Hypertension   . Neuropathy   . Sleep difficulties   . Trichomonal infection    2009    Review of Systems: Review of Systems  Constitutional: Negative for fever.  HENT: Negative for hearing loss.   Eyes: Negative for blurred vision.  Respiratory: Positive for cough. Negative for shortness of breath.   Cardiovascular: Negative for chest pain and leg swelling.  Gastrointestinal: Negative for abdominal pain and diarrhea.  Genitourinary: Negative for dysuria.  Musculoskeletal: Positive for back pain. Negative for falls.  Skin: Negative for rash.  Neurological: Positive for headaches.  Endo/Heme/Allergies: Positive for environmental allergies.     Physical Exam: Vitals:   06/29/17 1127  BP: (!) 189/78  Pulse: 72  Temp: 98.2 F (36.8 C)  TempSrc: Oral  SpO2: 100%  Weight: 174 lb 3.2 oz (79 kg)  Height: 5\' 3"  (1.6 m)   GENERAL- alert, co-operative, NAD HEENT- PERRL, oral mucosa appears moist, tympanic membranes are obstructed by cerumen bilaterally CARDIAC- RRR, no murmurs, rubs or gallops. RESP- CTAB, no wheezes or crackles. ABDOMEN- Soft,  nontender BACK- Normal curvature, no paraspinal tenderness, no CVA tenderness. NEURO-  Strength upper and lower extremities- 5/5, Sensation grossly intact EXTREMITIES- Symmetric, no pedal edema. SKIN- Warm, dry, left shin scarring PSYCH- Normal mood and affect, appropriate thought content and speech.   Assessment & Plan:   See encounters tab for problem based medical decision making.   Patient  discussed with Dr. Joni Reining

## 2017-06-29 NOTE — Patient Instructions (Addendum)
It was a pleasure to meet you today Sharon Norris.  I recommend we restart some of your medicines and then reassess our progress in about 2 weeks.  Please start taking lisinopril 20mg  and hydrochlorothiazide 25mg  once daily for your blood pressure.  You can also restart taking the gabapentin 600mg  (2 tablets) twice daily for your leg neuropathy.  For your earwax obstruction I recommend starting peroxide eardrops 3-5 drops per ear up to twice daily to help clear this.  We will need to see you again in about 2 weeks and check your blood work to make sure these blood pressure medicine doses are good for you.  I would also like to check in on how visiting with [name] has gone between now and then.

## 2017-06-30 LAB — T-HELPER CELL (CD4) - (RCID CLINIC ONLY)
CD4 T CELL HELPER: 21 % — AB (ref 33–55)
CD4 T Cell Abs: 480 /uL (ref 400–2700)

## 2017-06-30 LAB — URINE CYTOLOGY ANCILLARY ONLY
Chlamydia: NEGATIVE
Neisseria Gonorrhea: NEGATIVE

## 2017-07-01 LAB — HIV-1 RNA QUANT-NO REFLEX-BLD
HIV 1 RNA QUANT: DETECTED {copies}/mL — AB
HIV-1 RNA QUANT, LOG: DETECTED {Log_copies}/mL — AB

## 2017-07-02 DIAGNOSIS — H6123 Impacted cerumen, bilateral: Secondary | ICD-10-CM

## 2017-07-02 HISTORY — DX: Impacted cerumen, bilateral: H61.23

## 2017-07-02 NOTE — Assessment & Plan Note (Signed)
Her sensation seems grossly intact to examination today. She reports a moderate amount of pain but is not debilitated from any usual activities by this. We can try restarting previous treatment for this as she felt the gabapentin was partially effective in the past. Plan: -Start Gabapentin 600mg  BID

## 2017-07-02 NOTE — Assessment & Plan Note (Signed)
She has been off medication since over a year ago  But reports a pretty fair mood, concentration, and energy. She will plan to return for a walk in visit at [facility] and decide if her previous medicine needs restarting at this time.

## 2017-07-02 NOTE — Assessment & Plan Note (Signed)
Blood pressure is uncontrolled today at 189/78 off any medications. She presents no signs of a hypertensive urgency. Today she will restart her previously prescribed lisinopril and HCTZ and need to follow up in about 2 weeks to assess response to treatment. -Start Lisinopril 20mg  daily -Start HCTZ 25mg  daily

## 2017-07-02 NOTE — Assessment & Plan Note (Signed)
There is no evidence of inflammation or significant hearing loss to suggest complications from impaction. She digs in her ears with fingertips and otherwise has not tried any treatment for this. I counseled her on trying to avoid putting anything in her ears and starting to try with drops. If it does not improve or bothers her more we could offer irrigation Plan: -Start carbamide peroxide eardrops twice daily

## 2017-07-04 NOTE — Progress Notes (Signed)
Internal Medicine Clinic Attending  Case discussed with Dr. Rice at the time of the visit.  We reviewed the resident's history and exam and pertinent patient test results.  I agree with the assessment, diagnosis, and plan of care documented in the resident's note.  

## 2017-07-13 ENCOUNTER — Ambulatory Visit (INDEPENDENT_AMBULATORY_CARE_PROVIDER_SITE_OTHER): Payer: Medicaid Other | Admitting: Infectious Diseases

## 2017-07-13 ENCOUNTER — Encounter: Payer: Self-pay | Admitting: Infectious Diseases

## 2017-07-13 ENCOUNTER — Other Ambulatory Visit: Payer: Self-pay

## 2017-07-13 ENCOUNTER — Encounter: Payer: Self-pay | Admitting: Internal Medicine

## 2017-07-13 ENCOUNTER — Ambulatory Visit (INDEPENDENT_AMBULATORY_CARE_PROVIDER_SITE_OTHER): Payer: Medicaid Other | Admitting: Internal Medicine

## 2017-07-13 VITALS — BP 139/87 | HR 60 | Temp 97.7°F | Wt 175.0 lb

## 2017-07-13 VITALS — BP 177/76 | HR 70 | Temp 97.8°F | Ht 63.0 in | Wt 175.9 lb

## 2017-07-13 DIAGNOSIS — F99 Mental disorder, not otherwise specified: Secondary | ICD-10-CM

## 2017-07-13 DIAGNOSIS — G629 Polyneuropathy, unspecified: Secondary | ICD-10-CM

## 2017-07-13 DIAGNOSIS — Z23 Encounter for immunization: Secondary | ICD-10-CM

## 2017-07-13 DIAGNOSIS — W19XXXA Unspecified fall, initial encounter: Secondary | ICD-10-CM | POA: Insufficient documentation

## 2017-07-13 DIAGNOSIS — B20 Human immunodeficiency virus [HIV] disease: Secondary | ICD-10-CM

## 2017-07-13 DIAGNOSIS — Z79899 Other long term (current) drug therapy: Secondary | ICD-10-CM

## 2017-07-13 DIAGNOSIS — K089 Disorder of teeth and supporting structures, unspecified: Secondary | ICD-10-CM | POA: Diagnosis not present

## 2017-07-13 DIAGNOSIS — B182 Chronic viral hepatitis C: Secondary | ICD-10-CM | POA: Diagnosis not present

## 2017-07-13 DIAGNOSIS — Z113 Encounter for screening for infections with a predominantly sexual mode of transmission: Secondary | ICD-10-CM

## 2017-07-13 DIAGNOSIS — Z9181 History of falling: Secondary | ICD-10-CM | POA: Diagnosis not present

## 2017-07-13 DIAGNOSIS — I10 Essential (primary) hypertension: Secondary | ICD-10-CM

## 2017-07-13 MED ORDER — LISINOPRIL-HYDROCHLOROTHIAZIDE 20-25 MG PO TABS: 1.0000 | ORAL_TABLET | Freq: Every day | ORAL | 1 refills | Status: DC

## 2017-07-13 MED ORDER — PNEUMOCOCCAL 13-VAL CONJ VACC IM SUSP
0.5000 mL | INTRAMUSCULAR | Status: AC
  Administered 2017-07-13: 0.5 mL via INTRAMUSCULAR

## 2017-07-13 MED ORDER — AMLODIPINE BESYLATE 5 MG PO TABS: 5.0000 mg | ORAL_TABLET | Freq: Every day | ORAL | 1 refills | Status: DC

## 2017-07-13 MED ORDER — DIVALPROEX SODIUM ER 500 MG PO TB24: 500.0000 mg | ORAL_TABLET | Freq: Every day | ORAL | 1 refills | Status: DC

## 2017-07-13 NOTE — Assessment & Plan Note (Signed)
She is working on getting in with dental.

## 2017-07-13 NOTE — Progress Notes (Signed)
CC: 2 week follow up for hypertension  HPI:  [name] is a 62 y.o. female with PMHx detailed below presenting for follow up 2 weeks after restarting medications for her hypertension and neuropathy.  She reports good adherence to the lisinopril and HCTZ without any new symptom complaints. Other active problems remain her low back and leg pain, weakness, and depressed mood. She continues having significant low back pain with radiation down both thighs.  There is significant leg weakness and she describes one fall while showering over the past 2 weeks.  Otherwise she is ambulatory with no assistive device. Mood remains extremely poor with feelings of depression and hopelessness.  This is from a combination of life stressors in her chronic pain.  She has had passive thoughts of dying without any specific plan.  See problem based assessment and plan below for additional details.  Essential hypertension Her blood pressure remains uncontrolled today at 177/76.  This is a slight improvement from 2 weeks ago after starting lisinopril and hydrochlorothiazide.  She has no symptoms suggesting intolerance of these medicines.  Several years ago she was being treated with for medications including amlodipine and clonidine as well but these been stopped due to excessive decrease in her blood pressure, and later through lack of follow-up. Plan: Repeat basic metabolic panel today Continue lisinopril and hydrochlorothiazide, will transition to combination pill 20-25 mg Start amlodipine 5 mg daily Return to clinic next month for repeat check  Fall This on reports falling in her bathroom within the past 2 weeks.  She attributes this to her leg weakness which is a chronic problem but worsened lately.  She denies any sense of dizziness, loss of balance, or loss of consciousness associated with this fall.  She is walking in the home without any assisting devices but often gets help from a personal care  aide. My examination she has weakness with decreased muscle bulk in the thighs that seems very deconditioned and out of proportion to her neurologic symptoms. Plan: Referral to physical therapy for gait assessment/assistive device requirements Order shower chair  Psychiatric disorder Her PHQ 9 score is 14 today with a clearly depressed affect.  She reports passive thoughts of dying more than any specific suicidal intent.  She is not yet been to [facility] reports difficulty arranging for her transportation to get her in early morning walking appointment.  I think the benefit would be significant to restart medical treatment here and hope she does follow-up with outpatient psychiatry as soon as possible. Plan: Start Depakote 500 mg daily   Past Medical History:  Diagnosis Date  . Anxiety   . Bronchitis   . Depression   . 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 Hepatitis    Hep. C  . History of suicidal ideation    thought about overdose in the past  . HIV infection ([place])   . Hypertension   . Neuropathy   . Sleep difficulties   . Trichomonal infection    2009    Review of Systems: Review of Systems  Constitutional: Negative for chills and fever.  HENT: Positive for congestion.   Eyes: Positive for blurred vision.  Respiratory: Positive for cough.   Cardiovascular: Negative for leg swelling.  Gastrointestinal: Negative for abdominal pain.  Genitourinary: Negative for dysuria.  Musculoskeletal: Positive for back pain, falls, joint pain and myalgias. Negative for neck pain.  Skin: Negative for rash.  Neurological: Positive for weakness. Negative for  dizziness and focal weakness.  Psychiatric/Behavioral: Positive for depression and suicidal ideas.     Physical Exam: Vitals:   07/13/17 1340 07/13/17 1431  BP: (!) 192/81 (!) 177/76  Pulse: 72 70  Temp: 97.8 F (36.6 C)   TempSrc: Oral   SpO2: 100% 100%  Weight: 175 lb 14.4 oz (79.8 kg)   Height: 5\' 3"  (1.6 m)      GENERAL- alert, co-operative, NAD HEENT- Atraumatic, oral mucosa appears moist, poor dentition with cracked teeth and caries CARDIAC- RRR, no murmurs, rubs or gallops. RESP- CTAB, no wheezes or crackles. BACK-tenderness over spinous processes around L4-S1, low back muscular tenderness above iliac crest bilaterally, tenderness to palpation of gluteal muscles NEURO- 2/4 bilateral patellar reflexes, sensation is grossly intact in both feet EXTREMITIES- pulse 2+, symmetric, no pedal edema.  Leg pain worse along anterior and lateral thighs with tenderness to touch, pain on internal and external rotation, strength is weak 4/5 seemingly also limited by pain SKIN- Warm, dry, No rash or lesion. PSYCH-easily tearful during exam, appropriate thought content and speech   Assessment & Plan:   See encounters tab for problem based medical decision making.   Patient discussed with Dr. Dareen Piano
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
## 2017-07-13 NOTE — Assessment & Plan Note (Signed)
Her blood pressure remains uncontrolled today at 177/76.  This is a slight improvement from 2 weeks ago after starting lisinopril and hydrochlorothiazide.  She has no symptoms suggesting intolerance of these medicines.  Several years ago she was being treated with for medications including amlodipine and clonidine as well but these been stopped due to excessive decrease in her blood pressure, and later through lack of follow-up. Plan: Repeat basic metabolic panel today Continue lisinopril and hydrochlorothiazide, will transition to combination pill 20-25 mg Start amlodipine 5 mg daily Return to clinic next month for repeat check

## 2017-07-13 NOTE — Assessment & Plan Note (Signed)
This on reports falling in her bathroom within the past 2 weeks.  She attributes this to her leg weakness which is a chronic problem but worsened lately.  She denies any sense of dizziness, loss of balance, or loss of consciousness associated with this fall.  She is walking in the home without any assisting devices but often gets help from a personal care aide. My examination she has weakness with decreased muscle bulk in the thighs that seems very deconditioned and out of proportion to her neurologic symptoms. Plan: Referral to physical therapy for gait assessment/assistive device requirements Order shower chair

## 2017-07-13 NOTE — Assessment & Plan Note (Signed)
Will check her RNA for cure.

## 2017-07-13 NOTE — Assessment & Plan Note (Signed)
Her PHQ 9 score is 14 today with a clearly depressed affect.  She reports passive thoughts of dying more than any specific suicidal intent.  She is not yet been to [facility] reports difficulty arranging for her transportation to get her in early morning walking appointment.  I think the benefit would be significant to restart medical treatment here and hope she does follow-up with outpatient psychiatry as soon as possible. Plan: Start Depakote 500 mg daily

## 2017-07-13 NOTE — Assessment & Plan Note (Signed)
Appreciate her IMTS MD for their excellent care

## 2017-07-13 NOTE — Addendum Note (Signed)
Addended by: Aundria Rud on: 07/13/2017 03:59 PM   Modules accepted: Orders

## 2017-07-13 NOTE — Patient Instructions (Addendum)
Start taking amlodipine 5 mg daily. You can continue the lisinopril and hydrochlorothiazide.  The future prescription refill will be a combination pill.  We will check a blood test today and make sure these medications are being tolerated well.  Recommend restarting the Depakote 500 mg once daily for mood disorder.  Whenever possible issue try to make a visit with [facility] who can better follow-up if this is the right medicine and working as desired.  Continue taking the gabapentin twice daily, we can see if this gives any benefit of the next few weeks.  We will try to arrange for a shower chair.  Also recommending a physical therapy evaluation to see if any other assistive devices are recommended for home safety at this time.

## 2017-07-13 NOTE — Assessment & Plan Note (Signed)
She is meeting with Rollene Fare today.

## 2017-07-13 NOTE — Assessment & Plan Note (Signed)
She is doing well Will get her mammo, pap Will give her prevnar Will see her back in 9 months with labs prior.

## 2017-07-14 LAB — BMP8+ANION GAP
ANION GAP: 15 mmol/L (ref 10.0–18.0)
BUN/Creatinine Ratio: 17 (ref 12–28)
BUN: 22 mg/dL (ref 8–27)
CO2: 16 mmol/L — ABNORMAL LOW (ref 20–29)
CREATININE: 1.33 mg/dL — AB (ref 0.57–1.00)
Calcium: 9 mg/dL (ref 8.7–10.3)
Chloride: 107 mmol/L — ABNORMAL HIGH (ref 96–106)
GFR, EST AFRICAN AMERICAN: 49 mL/min/{1.73_m2} — AB (ref >59)
GFR, EST NON AFRICAN AMERICAN: 43 mL/min/{1.73_m2} — AB (ref >59)
Glucose: 90 mg/dL (ref 65–99)
Potassium: 4.3 mmol/L (ref 3.5–5.2)
Sodium: 138 mmol/L (ref 134–144)

## 2017-07-15 LAB — HEPATITIS C RNA QUANTITATIVE
HCV QUANT LOG: NOT DETECTED {Log_IU}/mL
HCV RNA, PCR, QN: <15 IU/mL

## 2017-07-15 NOTE — Progress Notes (Signed)
Internal Medicine Clinic Attending  Case discussed with Dr. Rice at the time of the visit.  We reviewed the resident's history and exam and pertinent patient test results.  I agree with the assessment, diagnosis, and plan of care documented in the resident's note.  

## 2017-07-26 ENCOUNTER — Other Ambulatory Visit: Payer: Self-pay | Admitting: Infectious Diseases

## 2017-07-26 DIAGNOSIS — B20 Human immunodeficiency virus [HIV] disease: Secondary | ICD-10-CM

## 2017-08-11 ENCOUNTER — Encounter: Payer: Self-pay | Admitting: *Deleted

## 2017-08-30 ENCOUNTER — Other Ambulatory Visit: Payer: Self-pay | Admitting: Internal Medicine

## 2017-08-30 DIAGNOSIS — I10 Essential (primary) hypertension: Secondary | ICD-10-CM

## 2017-08-30 DIAGNOSIS — F99 Mental disorder, not otherwise specified: Secondary | ICD-10-CM

## 2017-09-07 ENCOUNTER — Ambulatory Visit: Payer: Medicaid Other

## 2017-09-08 NOTE — Addendum Note (Signed)
Addended by: Hulan Fray on: 09/08/2017 04:11 PM   Modules accepted: Orders

## 2017-09-21 ENCOUNTER — Ambulatory Visit: Payer: Medicaid Other

## 2017-09-21 ENCOUNTER — Encounter: Payer: Self-pay | Admitting: General Practice

## 2017-10-13 ENCOUNTER — Other Ambulatory Visit: Payer: Self-pay | Admitting: Internal Medicine

## 2017-10-13 DIAGNOSIS — I10 Essential (primary) hypertension: Secondary | ICD-10-CM

## 2017-10-13 DIAGNOSIS — F99 Mental disorder, not otherwise specified: Secondary | ICD-10-CM

## 2017-10-13 NOTE — Telephone Encounter (Signed)
Attempted to reach patient to discuss below. No answer. Left message on VM requesting return call. Hubbard Hartshorn, RN, BSN

## 2017-10-13 NOTE — Telephone Encounter (Signed)
BP better at Dr Algis Downs appt after Dr Benjamine Mola start norvasc,  Depakote started in May for "psychiatric" reason. No F/U appt since. No level. Unknown if pt went to [facility].   1. Did pt go to mental health?  If yes - they need to fill  If no, I will give 30 days so she has time to go  2. Needs PCP assigned  3. Needs Oct / Nov / Dec PCP appt HTN F/U  4. Mental health F/U needs to be determined

## 2017-10-14 NOTE — Telephone Encounter (Signed)
No telephone # available on chart. I did call [phone] (from previous telephone note); no answer; left message "if correct number, to call the office".

## 2017-10-23 ENCOUNTER — Other Ambulatory Visit: Payer: Self-pay | Admitting: Internal Medicine

## 2017-10-23 DIAGNOSIS — F99 Mental disorder, not otherwise specified: Secondary | ICD-10-CM

## 2018-03-19 ENCOUNTER — Other Ambulatory Visit: Payer: Self-pay | Admitting: Infectious Diseases

## 2018-03-19 ENCOUNTER — Other Ambulatory Visit: Payer: Self-pay | Admitting: Internal Medicine

## 2018-03-19 DIAGNOSIS — B20 Human immunodeficiency virus [HIV] disease: Secondary | ICD-10-CM

## 2018-03-19 DIAGNOSIS — I10 Essential (primary) hypertension: Secondary | ICD-10-CM

## 2018-03-28 ENCOUNTER — Other Ambulatory Visit: Payer: Medicaid Other

## 2018-04-11 ENCOUNTER — Encounter: Payer: Medicaid Other | Admitting: Infectious Diseases

## 2018-05-05 ENCOUNTER — Other Ambulatory Visit: Payer: Self-pay | Admitting: Internal Medicine

## 2018-05-05 ENCOUNTER — Other Ambulatory Visit: Payer: Self-pay | Admitting: Behavioral Health

## 2018-05-05 DIAGNOSIS — I10 Essential (primary) hypertension: Secondary | ICD-10-CM

## 2018-05-05 DIAGNOSIS — B20 Human immunodeficiency virus [HIV] disease: Secondary | ICD-10-CM

## 2018-05-05 DIAGNOSIS — F99 Mental disorder, not otherwise specified: Secondary | ICD-10-CM

## 2018-05-05 DIAGNOSIS — G620 Drug-induced polyneuropathy: Secondary | ICD-10-CM

## 2018-05-05 MED ORDER — ABACAVIR-DOLUTEGRAVIR-LAMIVUD 600-50-300 MG PO TABS: 1.0000 | ORAL_TABLET | Freq: Every day | ORAL | 2 refills | Status: DC

## 2018-05-05 NOTE — Telephone Encounter (Signed)
lisinopril-hydrochlorothiazide (PRINZIDE,ZESTORETIC) 20-25 MG tablet  amLODipine (NORVASC) 5 MG tablet  gabapentin (NEURONTIN) 300 MG capsule  divalproex (DEPAKOTE ER) 500 MG 24 hr tablet  Refill request @  [place], [address] [employer] [address] [phone] (Phone) [phone] (Fax)

## 2018-05-06 MED ORDER — DIVALPROEX SODIUM ER 500 MG PO TB24: 500.0000 mg | ORAL_TABLET | Freq: Every day | ORAL | 1 refills | Status: DC

## 2018-05-06 MED ORDER — GABAPENTIN 300 MG PO CAPS: ORAL_CAPSULE | ORAL | 0 refills | Status: DC

## 2018-05-16 ENCOUNTER — Other Ambulatory Visit: Payer: Self-pay | Admitting: Internal Medicine

## 2018-05-16 DIAGNOSIS — G620 Drug-induced polyneuropathy: Secondary | ICD-10-CM

## 2018-05-16 NOTE — Telephone Encounter (Signed)
Refilled on 05/06/18 for 46mth supply, pharmacy requesting additional refills for next month.Despina Hidden Cassady3/30/20203:39 PM

## 2018-05-17 NOTE — Telephone Encounter (Signed)
Hi Kaye,   The refill should not be due until 4/20. She takes 1 pill twice a day and I ordered for #120 pills  Thanks

## 2018-05-20 NOTE — Telephone Encounter (Signed)
I called pt - stated she takes Gabapentin 1 tab twice a day as stated per Dr Eileen Stanford; and does not need a refill at this time.

## 2018-06-08 ENCOUNTER — Other Ambulatory Visit: Payer: Self-pay | Admitting: Internal Medicine

## 2018-06-08 DIAGNOSIS — F99 Mental disorder, not otherwise specified: Secondary | ICD-10-CM

## 2018-06-28 ENCOUNTER — Ambulatory Visit (INDEPENDENT_AMBULATORY_CARE_PROVIDER_SITE_OTHER): Payer: Medicaid Other | Admitting: Infectious Diseases

## 2018-06-28 ENCOUNTER — Other Ambulatory Visit: Payer: Self-pay

## 2018-06-28 ENCOUNTER — Encounter: Payer: Self-pay | Admitting: Infectious Diseases

## 2018-06-28 DIAGNOSIS — B20 Human immunodeficiency virus [HIV] disease: Secondary | ICD-10-CM

## 2018-06-28 DIAGNOSIS — B182 Chronic viral hepatitis C: Secondary | ICD-10-CM

## 2018-06-28 DIAGNOSIS — K5900 Constipation, unspecified: Secondary | ICD-10-CM | POA: Insufficient documentation

## 2018-06-28 DIAGNOSIS — Z113 Encounter for screening for infections with a predominantly sexual mode of transmission: Secondary | ICD-10-CM

## 2018-06-28 DIAGNOSIS — I10 Essential (primary) hypertension: Secondary | ICD-10-CM

## 2018-06-28 DIAGNOSIS — K5903 Drug induced constipation: Secondary | ICD-10-CM

## 2018-06-28 DIAGNOSIS — Z79899 Other long term (current) drug therapy: Secondary | ICD-10-CM

## 2018-06-28 NOTE — Assessment & Plan Note (Signed)
Asked her to consider OTC.  Consider changing norvasc, at f/u with PCP.

## 2018-06-28 NOTE — Assessment & Plan Note (Signed)
Cured. 

## 2018-06-28 NOTE — Assessment & Plan Note (Signed)
Has home BP monitor- she has been getting normal numbers from this.  Will defer to her PCP to consider changing her rx due to constipation.

## 2018-06-28 NOTE — Assessment & Plan Note (Signed)
Her counts are good from 1 year ago.  Will have her back in the lab.  Will see her back in 6 months.  Her vax are up to date except flu vax.
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
## 2018-07-04 ENCOUNTER — Other Ambulatory Visit: Payer: Self-pay | Admitting: Infectious Diseases

## 2018-07-04 DIAGNOSIS — B20 Human immunodeficiency virus [HIV] disease: Secondary | ICD-10-CM

## 2018-07-06 ENCOUNTER — Other Ambulatory Visit: Payer: Self-pay

## 2018-07-06 ENCOUNTER — Ambulatory Visit (INDEPENDENT_AMBULATORY_CARE_PROVIDER_SITE_OTHER): Payer: Medicaid Other | Admitting: Internal Medicine

## 2018-07-06 ENCOUNTER — Encounter: Payer: Self-pay | Admitting: Internal Medicine

## 2018-07-06 DIAGNOSIS — I10 Essential (primary) hypertension: Secondary | ICD-10-CM | POA: Diagnosis not present

## 2018-07-06 DIAGNOSIS — K5903 Drug induced constipation: Secondary | ICD-10-CM

## 2018-07-06 DIAGNOSIS — Z0000 Encounter for general adult medical examination without abnormal findings: Secondary | ICD-10-CM

## 2018-07-06 NOTE — Assessment & Plan Note (Signed)
She will have to have colonoscopy, Pap smear and also abstain from tobacco use.

## 2018-07-06 NOTE — Assessment & Plan Note (Signed)
Her blood pressure is not at goal.  She was last evaluated on Jul 13, 2017 for which her blood pressure was measured at 139/87.  Per chart review she has ranged in the 170s-190s/70s-80s over the past year.  She tells me that she monitors her blood pressure at home and is usually approximately 120s/70s (very questionable).  However denies headaches, dizziness, lightheadedness, chest pain, abdominal pain, nausea, vomiting, shortness of breath.  She is currently on a combination lisinopril-HCTZ 20-25 mg daily and amlodipine 5 mg daily.   Plan: - Follow-up BMP - If BP remains uncontrolled, can increase amlodipine to 10 mg daily OR add low-dose hydralazine.

## 2018-07-06 NOTE — Progress Notes (Signed)
.    [facility] Internal Medicine Residency Telephone Encounter Continuity Care Appointment  HPI:   This telephone encounter was created for [name] on 07/06/2018 for the following purpose/cc: Continuity Clinic.  Sharon Norris is a 63 year old very pleasant woman with hypertension, HIV, chronic hepatitis C whom I called to follow-up on blood pressure.  Please see problem based charting for further details   Past Medical History:  Past Medical History:  Diagnosis Date  . Anxiety   . Bronchitis   . Depression   . H/O drug abuse ([place])    IV drugs, drug free 12 years  . Hepatitis    Hep. C  . History of suicidal ideation    thought about overdose in the past  . HIV infection ([place])   . Hypertension   . Neuropathy   . Sleep difficulties   . Trichomonal infection    2009      ROS:      Assessment / Plan / Recommendations:   Please see A&P under problem oriented charting for assessment of the patient's acute and chronic medical conditions.   As always, pt is advised that if symptoms worsen or new symptoms arise, they should go to an urgent care facility or to to ER for further evaluation.   Consent and Medical Decision Making:   Patient discussed with Dr. Dareen Piano  This is a telephone encounter between Sharon Norris and [name] on 07/06/2018 for Continuity Clinic. The visit was conducted with the patient located at home and [name] at [facility]. The patient's identity was confirmed using their DOB and current address. The patient has consented to being evaluated through a telephone encounter and understands the associated risks (an examination cannot be done and the patient may need to come in for an appointment) / benefits (allows the patient to remain at home, decreasing exposure to coronavirus). I personally spent 12 minutes on medical discussion.

## 2018-07-06 NOTE — Assessment & Plan Note (Signed)
Ms. Biondo reports of a history of gas and constipation.  Her stools are usually hard and sometimes has to strain.  She has not tried any medication for this.   Plan: -Increase fiber in diet -Trial of MiraLAX -If gas persists, will add simethicone.

## 2018-07-07 NOTE — Addendum Note (Signed)
Addended by: Truddie Crumble on: 07/07/2018 10:51 AM   Modules accepted: Orders

## 2018-07-07 NOTE — Progress Notes (Signed)
Internal Medicine Clinic Attending  Case discussed with Dr. Agyei at the time of the visit.  We reviewed the resident's history and exam and pertinent patient test results.  I agree with the assessment, diagnosis, and plan of care documented in the resident's note.    

## 2018-07-08 ENCOUNTER — Encounter: Payer: Medicaid Other | Admitting: Internal Medicine

## 2018-07-08 ENCOUNTER — Ambulatory Visit: Payer: Medicaid Other | Admitting: Internal Medicine

## 2018-07-14 ENCOUNTER — Telehealth: Payer: Self-pay

## 2018-07-14 NOTE — Telephone Encounter (Signed)
Or she could go to urgent care

## 2018-07-14 NOTE — Telephone Encounter (Signed)
Return call to pt - bottom gum is swollen; using orajel; rinsing w/hydrogen peroxide - started this am. Requesting abx. No appts available this week; next in person [facility] appt available will be Wed of next week. Also stated she does not have transportation. Please advise. Thanks

## 2018-07-14 NOTE — Telephone Encounter (Signed)
Patient will need to be seen in person.

## 2018-07-14 NOTE — Telephone Encounter (Signed)
Called pt - stated she has no transportation at all.

## 2018-07-14 NOTE — Telephone Encounter (Signed)
I think that the patient will need to be evaluated in person. Can we arrange transport for her?

## 2018-07-14 NOTE — Telephone Encounter (Signed)
Requesting to speak with a nurse about getting antibiotic for abscess in the mouth. Please call pt back.

## 2018-07-15 NOTE — Telephone Encounter (Signed)
Called pt - informed the doctor prefers her to come in to be seen and we will arrange transportation. Stated she prefers not to come in; "that's alright"  "I will continue to rinse my mouth and if it gets worse, I will call yall back".

## 2018-07-15 NOTE — Telephone Encounter (Signed)
Ok, not ideal, but we can't make her come in. Would recommend dental evaluation as well, though recognizing that can be difficult to access and would require transportation as well.

## 2018-07-19 ENCOUNTER — Encounter (HOSPITAL_COMMUNITY): Payer: Self-pay | Admitting: Emergency Medicine

## 2018-07-19 ENCOUNTER — Emergency Department (HOSPITAL_COMMUNITY)
Admission: EM | Admit: 2018-07-19 | Discharge: 2018-07-19 | Disposition: A | Discharge status: Home / Self Care | Payer: Medicare Other | Attending: Emergency Medicine | Admitting: Emergency Medicine

## 2018-07-19 ENCOUNTER — Other Ambulatory Visit: Payer: Self-pay

## 2018-07-19 DIAGNOSIS — Z79899 Other long term (current) drug therapy: Secondary | ICD-10-CM | POA: Insufficient documentation

## 2018-07-19 DIAGNOSIS — B20 Human immunodeficiency virus [HIV] disease: Secondary | ICD-10-CM | POA: Insufficient documentation

## 2018-07-19 DIAGNOSIS — Z87891 Personal history of nicotine dependence: Secondary | ICD-10-CM | POA: Insufficient documentation

## 2018-07-19 DIAGNOSIS — I10 Essential (primary) hypertension: Secondary | ICD-10-CM | POA: Insufficient documentation

## 2018-07-19 DIAGNOSIS — R42 Dizziness and giddiness: Secondary | ICD-10-CM | POA: Diagnosis not present

## 2018-07-19 DIAGNOSIS — I959 Hypotension, unspecified: Secondary | ICD-10-CM | POA: Diagnosis present

## 2018-07-19 DIAGNOSIS — F10920 Alcohol use, unspecified with intoxication, uncomplicated: Secondary | ICD-10-CM | POA: Insufficient documentation

## 2018-07-19 LAB — ETHANOL: Alcohol, Ethyl (B): 103 mg/dL — ABNORMAL HIGH (ref <10)

## 2018-07-19 MED ORDER — MECLIZINE HCL 25 MG PO TABS
25.0000 mg | ORAL_TABLET | Freq: Once | ORAL | Status: AC
  Administered 2018-07-19: 05:00:00 25 mg via ORAL
  Filled 2018-07-19: qty 1

## 2018-07-19 MED ORDER — SODIUM CHLORIDE 0.9 % IV BOLUS
1000.0000 mL | Freq: Once | INTRAVENOUS | Status: AC
  Administered 2018-07-19: 1000 mL via INTRAVENOUS

## 2018-07-19 MED ORDER — IBUPROFEN 200 MG PO TABS
400.0000 mg | ORAL_TABLET | Freq: Once | ORAL | Status: AC
  Administered 2018-07-19: 400 mg via ORAL
  Filled 2018-07-19: qty 2

## 2018-07-19 NOTE — ED Notes (Signed)
Pt attempted to ambulate in hallway but had unsteady gait and stated she felt very dizzy. Provider witnessed pt ambulating.
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
## 2018-07-19 NOTE — Discharge Instructions (Addendum)
You were seen today for dizziness.  This is likely related to your alcohol use tonight in addition to your blood pressure medications.  You need to be mindful to limit your drinking.

## 2018-07-19 NOTE — ED Notes (Signed)
Bed: EB91 Expected date:  Expected time:  Means of arrival:  Comments: 63 yo F/hypotesion-ETOH
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
## 2018-08-24 ENCOUNTER — Ambulatory Visit (INDEPENDENT_AMBULATORY_CARE_PROVIDER_SITE_OTHER): Payer: Medicare Other | Admitting: Internal Medicine

## 2018-08-24 ENCOUNTER — Other Ambulatory Visit: Payer: Self-pay

## 2018-08-24 ENCOUNTER — Encounter: Payer: Self-pay | Admitting: Internal Medicine

## 2018-08-24 VITALS — BP 141/66 | HR 69 | Temp 98.0°F | Wt 197.2 lb

## 2018-08-24 DIAGNOSIS — B20 Human immunodeficiency virus [HIV] disease: Secondary | ICD-10-CM | POA: Diagnosis not present

## 2018-08-24 DIAGNOSIS — Z6834 Body mass index (BMI) 34.0-34.9, adult: Secondary | ICD-10-CM | POA: Diagnosis not present

## 2018-08-24 DIAGNOSIS — Z79899 Other long term (current) drug therapy: Secondary | ICD-10-CM

## 2018-08-24 DIAGNOSIS — Z0000 Encounter for general adult medical examination without abnormal findings: Secondary | ICD-10-CM

## 2018-08-24 DIAGNOSIS — Z23 Encounter for immunization: Secondary | ICD-10-CM

## 2018-08-24 DIAGNOSIS — I10 Essential (primary) hypertension: Secondary | ICD-10-CM | POA: Diagnosis not present

## 2018-08-24 DIAGNOSIS — R635 Abnormal weight gain: Secondary | ICD-10-CM

## 2018-08-24 NOTE — Patient Instructions (Addendum)
Sharon Norris,   It was a pleasure taking care of you at the clinic today.  Overall, he seems you are doing very well.  As we discussed today, your weight is up about 17 pounds since about a month ago and you attributed this to not being very physically active because of the coronavirus as well as diet.  I will advise that you had a low salt and low carbohydrate diet.  Also place start walking around the neighborhood in order to keep yourself physically active.  I am ordering blood work to check on your kidneys today.  Take Care! Dr. Eileen Stanford   DASH diet: What to eat. Both versions of the DASH diet include lots of whole grains, fruits, vegetables and low-fat dairy products. The DASH diet also includes some fish, poultry and legumes, and encourages a small amount of nuts and seeds a few times a week. You can eat red meat, sweets and fats in small amounts.

## 2018-08-24 NOTE — Assessment & Plan Note (Signed)
HIV: She continues on Triumeq.  HIV RNA was less than 20 when last checked in May 2019.  CD4 level of 480.  Plan: -Continue Triumeq and follow-up with infectious disease

## 2018-08-24 NOTE — Assessment & Plan Note (Signed)
Health maintenance: She is overdue for colonoscopy, mammogram, DEXA scan, Pap smear and Tdap.  She is unwilling to have these done and while he opts to get her pneumococcal vaccine today.  Per chart, she has had about 20 pound weight gain since May 2019.  She does report of sedentary lifestyle and decreased activity since the coronavirus pandemic and is willing to resume exercising by walking around the neighborhood and also engaging in healthy eating lifestyle.

## 2018-08-24 NOTE — Progress Notes (Signed)
   CC: Follow-up hypertension.  HPI:  [name] is a 63 y.o. with medical history listed below presenting to follow-up on hypertension.  Please see problem based charting for further details.  Past Medical History:  Diagnosis Date  . Anxiety   . Bronchitis   . Depression   . Excessive cerumen in both ear canals 07/02/2017  . H/O drug abuse ([place])    IV drugs, drug free 12 years  . Hepatitis    Hep. C  . History of suicidal ideation    thought about overdose in the past  . HIV infection ([place])   . Hypertension   . Neuropathy   . Sleep difficulties   . Trichomonal infection    2009   Review of Systems:  As per HPI  Physical Exam:  Vitals:   08/24/18 1515  BP: (!) 141/66  Pulse: 69  Temp: 98 F (36.7 C)  TempSrc: Oral  SpO2: 100%  Weight: 197 lb 3.2 oz (89.4 kg)   Physical Exam Neck:     Musculoskeletal: Neck supple.  Cardiovascular:     Rate and Rhythm: Normal rate and regular rhythm.     Heart sounds: No murmur.  Pulmonary:     Effort: Pulmonary effort is normal.     Breath sounds: Normal breath sounds. No wheezing, rhonchi or rales.  Abdominal:     General: Distention: Noticeable abdominal fat.     Tenderness: There is no abdominal tenderness. There is no guarding.  Neurological:     Mental Status: She is alert.     Assessment & Plan:   See Encounters Tab for problem based charting.  Patient discussed with Dr. Angelia Mould

## 2018-08-24 NOTE — Assessment & Plan Note (Signed)
Hypertension: This has been somewhat controlled on lisinopril-HCTZ 20-25 mg daily and amlodipine 5 mg daily.  She denies high salt diet but does endorse to drink a lot of soda.  Denies chest pain, shortness of breath, or notes, lightheadedness.  BP Readings from Last 3 Encounters:  08/24/18 (!) 141/66  07/19/18 136/71  07/13/17 139/87   Plan: -Continue lisinopril-HCTZ 20-25 mg daily -Continue amlodipine 5 mg daily

## 2018-08-25 ENCOUNTER — Encounter: Payer: Self-pay | Admitting: Internal Medicine

## 2018-08-25 DIAGNOSIS — N184 Chronic kidney disease, stage 4 (severe): Secondary | ICD-10-CM | POA: Insufficient documentation

## 2018-08-25 LAB — BMP8+ANION GAP
Anion Gap: 11 mmol/L (ref 10.0–18.0)
BUN/Creatinine Ratio: 20 (ref 12–28)
BUN: 33 mg/dL — ABNORMAL HIGH (ref 8–27)
CO2: 17 mmol/L — ABNORMAL LOW (ref 20–29)
Calcium: 8.8 mg/dL (ref 8.7–10.3)
Chloride: 112 mmol/L — ABNORMAL HIGH (ref 96–106)
Creatinine, Ser: 1.61 mg/dL — ABNORMAL HIGH (ref 0.57–1.00)
GFR calc Af Amer: 38 mL/min/{1.73_m2} — ABNORMAL LOW (ref >59)
GFR calc non Af Amer: 33 mL/min/{1.73_m2} — ABNORMAL LOW (ref >59)
Glucose: 112 mg/dL — ABNORMAL HIGH (ref 65–99)
Potassium: 4.5 mmol/L (ref 3.5–5.2)
Sodium: 140 mmol/L (ref 134–144)

## 2018-08-29 ENCOUNTER — Other Ambulatory Visit: Payer: Self-pay | Admitting: Internal Medicine

## 2018-08-29 DIAGNOSIS — G620 Drug-induced polyneuropathy: Secondary | ICD-10-CM

## 2018-08-29 NOTE — Telephone Encounter (Signed)
Refill Request  gabapentin (NEURONTIN) 300 MG capsule  [facility] - [place], [place] - [address]

## 2018-08-30 MED ORDER — GABAPENTIN 300 MG PO CAPS: ORAL_CAPSULE | ORAL | 0 refills | Status: DC

## 2018-08-30 NOTE — Telephone Encounter (Signed)
Pt is calling back regarding medicine

## 2018-08-30 NOTE — Progress Notes (Signed)
Internal Medicine Clinic Attending  Case discussed with Dr. Agyei at the time of the visit.  We reviewed the resident's history and exam and pertinent patient test results.  I agree with the assessment, diagnosis, and plan of care documented in the resident's note.    

## 2018-09-01 ENCOUNTER — Encounter: Payer: Self-pay | Admitting: Internal Medicine

## 2018-09-07 ENCOUNTER — Other Ambulatory Visit: Payer: Self-pay | Admitting: Internal Medicine

## 2018-09-07 DIAGNOSIS — I10 Essential (primary) hypertension: Secondary | ICD-10-CM

## 2018-09-07 DIAGNOSIS — G620 Drug-induced polyneuropathy: Secondary | ICD-10-CM

## 2018-09-12 ENCOUNTER — Other Ambulatory Visit: Payer: Self-pay | Admitting: Internal Medicine

## 2018-09-12 DIAGNOSIS — I10 Essential (primary) hypertension: Secondary | ICD-10-CM

## 2018-10-31 ENCOUNTER — Other Ambulatory Visit: Payer: Self-pay | Admitting: Infectious Diseases

## 2018-10-31 DIAGNOSIS — B20 Human immunodeficiency virus [HIV] disease: Secondary | ICD-10-CM

## 2018-11-21 NOTE — Addendum Note (Signed)
Addended by: Truddie Crumble on: 11/21/2018 10:59 AM   Modules accepted: Orders

## 2018-12-28 ENCOUNTER — Other Ambulatory Visit: Payer: Self-pay | Admitting: Internal Medicine

## 2018-12-28 DIAGNOSIS — I10 Essential (primary) hypertension: Secondary | ICD-10-CM

## 2019-01-06 ENCOUNTER — Ambulatory Visit
Admission: EM | Admit: 2019-01-06 | Discharge: 2019-01-06 | Disposition: A | Discharge status: Home / Self Care | Payer: Medicare Other | Attending: Physician Assistant | Admitting: Physician Assistant

## 2019-01-06 ENCOUNTER — Other Ambulatory Visit: Payer: Self-pay

## 2019-01-06 DIAGNOSIS — I10 Essential (primary) hypertension: Secondary | ICD-10-CM

## 2019-01-06 DIAGNOSIS — M79672 Pain in left foot: Secondary | ICD-10-CM

## 2019-01-06 MED ORDER — NAPROXEN 500 MG PO TABS: 500.0000 mg | ORAL_TABLET | Freq: Two times a day (BID) | ORAL | 0 refills | Status: AC

## 2019-01-06 MED ORDER — KETOROLAC TROMETHAMINE 30 MG/ML IJ SOLN
30.0000 mg | Freq: Once | INTRAMUSCULAR | Status: AC
  Administered 2019-01-06: 19:00:00 30 mg via INTRAMUSCULAR

## 2019-01-06 MED ORDER — ALUM SULFATE-CA ACETATE EX PACK: 1.0000 | PACK | Freq: Three times a day (TID) | CUTANEOUS | 12 refills | Status: DC

## 2019-01-06 MED ORDER — CEFTRIAXONE SODIUM 1 G IJ SOLR
1.0000 g | Freq: Once | INTRAMUSCULAR | Status: AC
  Administered 2019-01-06: 1 g via INTRAMUSCULAR

## 2019-01-06 MED ORDER — FLUCONAZOLE 150 MG PO TABS: 150.0000 mg | ORAL_TABLET | ORAL | 0 refills | Status: DC

## 2019-01-06 MED ORDER — CEPHALEXIN 500 MG PO CAPS: 500.0000 mg | ORAL_CAPSULE | Freq: Four times a day (QID) | ORAL | 0 refills | Status: DC

## 2019-01-06 NOTE — ED Triage Notes (Signed)
Pt c/o lt big toe pain x5 days getting worse each day. Now having swelling and redness.

## 2019-01-06 NOTE — Discharge Instructions (Signed)
As discussed, worries for fungal and bacterial infection.  Start Diflucan and Keflex as directed.  Given you cannot start medicine tonight, Toradol injection and Rocephin injection in office today.  Naproxen as directed, this can help with pain, inflammation, gout.  If redness spreading, please go to the emergency department for further evaluation.

## 2019-01-06 NOTE — ED Provider Notes (Signed)
[facility]    CSN: [id number] Arrival date & time: 01/06/19  1732      History   Chief Complaint Chief Complaint  Patient presents with  . Toe Pain    HPI Sharon Norris is a 63 y.o. female.   63 year old female with history of peripheral neuropathy, HIV, hep C, comes in for 5-day history of left big toe pain.  Denies injury/trauma.  States pain started gradually, and has been worsening.  Area is erythematous and swollen.  She is having trouble moving toe.  Denies fever, chills, body aches.  Denies numbness, tingling.  Denies history of gout.  HIV well-controlled, nondetectable.     Past Medical History:  Diagnosis Date  . Anxiety   . Bronchitis   . Depression   . Excessive cerumen in both ear canals 07/02/2017  . H/O drug abuse ([place])    IV drugs, drug free 12 years  . Hepatitis    Hep. C  . History of suicidal ideation    thought about overdose in the past  . HIV infection ([place])   . Hypertension   . Neuropathy   . Sleep difficulties   . Trichomonal infection    2009    Patient Active Problem List   Diagnosis Date Noted  . CKD (chronic kidney disease), stage III 08/25/2018  . Constipated 06/28/2018  . Fall 07/13/2017  . Poor dentition 04/08/2016  . Health care maintenance 09/05/2013  . Psychiatric disorder 09/17/2010  . Peripheral neuropathy ([place]) 04/02/2009  . Insomnia 01/15/2009  . Chronic back pain 12/12/2007  . Human immunodeficiency virus (HIV) disease ([place]) 12/23/2005  . History of Chronic hepatitis C without hepatic coma ([place]) 12/23/2005  . Essential hypertension 12/23/2005    Past Surgical History:  Procedure Laterality Date  . COLPOSCOPY  2010    OB History    Gravida  3   Para  2   Term  2   Preterm      AB  1   Living        SAB      TAB  1   Ectopic      Multiple      Live Births               Home Medications    Prior to Admission medications   Medication Sig Start Date End Date Taking?  Authorizing Provider  aluminum sulfate-calcium acetate (DOMEBORO) packet Apply 1 packet topically 3 (three) times daily. 01/06/19   Tasia Catchings, Ming Mcmannis V, PA-C  amLODipine (NORVASC) 5 MG tablet TAKE ONE TABLET BY MOUTH DAILY 12/29/18   Agyei, Caprice Kluver, MD  cephALEXin (KEFLEX) 500 MG capsule Take 1 capsule (500 mg total) by mouth 4 (four) times daily. 01/06/19   Tasia Catchings, Sho Salguero V, PA-C  divalproex (DEPAKOTE ER) 500 MG 24 hr tablet Take 1 tablet (500 mg total) by mouth daily. 05/06/18   [name], MD  fluconazole (DIFLUCAN) 150 MG tablet Take 1 tablet (150 mg total) by mouth once a week. Take second dose 72 hours later if symptoms still persists. 01/06/19   Kenyonna Micek V, PA-C  gabapentin (NEURONTIN) 300 MG capsule TAKE TWO CAPSULES (600 MG TOTAL) BY MOUTH TWO TIMES DAILY. 08/30/18   Agyei, Caprice Kluver, MD  lisinopril-hydrochlorothiazide (ZESTORETIC) 20-25 MG tablet TAKE ONE TABLET BY MOUTH DAILY 12/29/18   [name], MD  naproxen (NAPROSYN) 500 MG tablet Take 1 tablet (500 mg total) by mouth 2 (two) times daily for 10 days.  01/06/19 01/16/19  Ok Edwards, PA-C  TRIUMEQ 600-50-300 MG tablet TAKE ONE TABLET BY MOUTH DAILY 10/31/18   Campbell Riches, MD    Family History Family History  Problem Relation Age of Onset  . Alcohol abuse Father   . Cancer Maternal Aunt        endometrial  . Cancer Sister        endometrial  . Cancer Sister        breast    Social History Social History   Tobacco Use  . Smoking status: Former Smoker    Quit date: 02/17/2000    Years since quitting: 18.8  . Smokeless tobacco: Never Used  Substance Use Topics  . Alcohol use: Yes    Alcohol/week: 0.0 standard drinks    Comment: beer, occasional  . Drug use: No     Allergies   Benadryl [diphenhydramine hcl], Ribavirin, and Viekira pak [ombitasvir-paritaprevir-ritonavir-dasabuvir]   Review of Systems Review of Systems  Reason unable to perform ROS: See HPI as above.     Physical Exam Triage Vital Signs ED Triage Vitals   Enc Vitals Group     BP 01/06/19 1751 (!) 152/87     Pulse Rate 01/06/19 1751 (!) 106     Resp 01/06/19 1751 20     Temp 01/06/19 1751 98.1 F (36.7 C)     Temp Source 01/06/19 1751 Oral     SpO2 01/06/19 1751 95 %     Weight --      Height --      Head Circumference --      Peak Flow --      Pain Score 01/06/19 1757 10     Pain Loc --      Pain Edu? --      Excl. in [place]? --    No data found.  Updated Vital Signs BP (!) 152/87 (BP Location: Right Arm)   Pulse (!) 106   Temp 98.1 F (36.7 C) (Oral)   Resp 20   SpO2 95%   Physical Exam Constitutional:      General: She is not in acute distress.    Appearance: She is well-developed. She is not diaphoretic.  HENT:     Head: Normocephalic and atraumatic.  Eyes:     Conjunctiva/sclera: Conjunctivae normal.     Pupils: Pupils are equal, round, and reactive to light.  Pulmonary:     Effort: Pulmonary effort is normal. No respiratory distress.  Musculoskeletal:     Comments: See picture below.  Swelling and erythema to the left great toe, extending to the MTP with warmth.  Dry/cracked skin to the plantar surface, toe webspace.  Diffuse tenderness to light touch of the foot.  No fluctuance felt.  Decreased range of motion of great toe and ankle due to pain.  Sensation intact and equal bilaterally.  Pedal pulse 2+.  Skin:    General: Skin is warm and dry.  Neurological:     Mental Status: She is alert and oriented to person, place, and time.            UC Treatments / Results  Labs (all labs ordered are listed, but only abnormal results are displayed) Labs Reviewed - No data to display  EKG   Radiology No results found.  Procedures Procedures (including critical care time)  Medications Ordered in UC Medications  ketorolac (TORADOL) 30 MG/ML injection 30 mg (30 mg Intramuscular Given 01/06/19 1832)  cefTRIAXone (ROCEPHIN) injection 1 g (  1 g Intramuscular Given 01/06/19 1832)    Initial Impression /  Assessment and Plan / UC Course  I have reviewed the triage vital signs and the nursing notes.  Pertinent labs & imaging results that were available during my care of the patient were reviewed by me and considered in my medical decision making (see chart for details).    ? Cellulitis vs tinea pedis vs gout.  Lower suspicion for septic joint at this time given patient still with range of motion although decreased.  No fever or chills.  Patient unable to obtain medication until 1 to 2 days later.  Will provide Rocephin injection and Toradol injection for symptomatic relief.  Patient is to start Keflex and Diflucan as directed.  Strict return precautions given.  Patient expresses understanding and agrees to plan.    Final Clinical Impressions(s) / UC Diagnoses   Final diagnoses:  Left foot pain   ED Prescriptions    Medication Sig Dispense Auth. Provider   fluconazole (DIFLUCAN) 150 MG tablet Take 1 tablet (150 mg total) by mouth once a week. Take second dose 72 hours later if symptoms still persists. 6 tablet Bellah Alia V, PA-C   cephALEXin (KEFLEX) 500 MG capsule Take 1 capsule (500 mg total) by mouth 4 (four) times daily. 28 capsule Abdulaziz Toman V, PA-C   aluminum sulfate-calcium acetate (DOMEBORO) packet Apply 1 packet topically 3 (three) times daily. 100 each Tasia Catchings, Rage Beever V, PA-C   naproxen (NAPROSYN) 500 MG tablet Take 1 tablet (500 mg total) by mouth 2 (two) times daily for 10 days. 20 tablet Ok Edwards, PA-C     I have reviewed the PDMP during this encounter.   Ok Edwards, PA-C 01/06/19 2002

## 2019-01-07 ENCOUNTER — Telehealth: Payer: Self-pay | Admitting: Emergency Medicine

## 2019-01-07 NOTE — Telephone Encounter (Signed)
Attempted to call patient to follow up, per APP request.  LEft voicemail encouraging return call.

## 2019-01-25 ENCOUNTER — Other Ambulatory Visit: Payer: Self-pay | Admitting: Infectious Diseases

## 2019-01-25 DIAGNOSIS — B20 Human immunodeficiency virus [HIV] disease: Secondary | ICD-10-CM

## 2019-02-06 ENCOUNTER — Other Ambulatory Visit: Payer: Self-pay | Admitting: Infectious Diseases

## 2019-02-06 ENCOUNTER — Telehealth: Payer: Self-pay

## 2019-02-06 ENCOUNTER — Other Ambulatory Visit: Payer: Self-pay | Admitting: Internal Medicine

## 2019-02-06 DIAGNOSIS — G620 Drug-induced polyneuropathy: Secondary | ICD-10-CM

## 2019-02-06 DIAGNOSIS — B20 Human immunodeficiency virus [HIV] disease: Secondary | ICD-10-CM

## 2019-02-06 NOTE — Telephone Encounter (Signed)
Attempted to call patient to schedule overdue appointment with our office. Patient last seen on 06/2018, was supposed to follow up with office 6 months later.  Unable to reach patient at this time. Left voicemail requesting patient call office back. [place]

## 2019-03-01 ENCOUNTER — Encounter: Payer: Medicare Other | Admitting: Internal Medicine

## 2019-03-01 ENCOUNTER — Other Ambulatory Visit: Payer: Self-pay | Admitting: Internal Medicine

## 2019-03-01 ENCOUNTER — Other Ambulatory Visit: Payer: Self-pay | Admitting: Infectious Diseases

## 2019-03-01 DIAGNOSIS — G620 Drug-induced polyneuropathy: Secondary | ICD-10-CM

## 2019-03-01 DIAGNOSIS — B20 Human immunodeficiency virus [HIV] disease: Secondary | ICD-10-CM

## 2019-03-01 NOTE — Telephone Encounter (Signed)
Hi,   I refilled this med 3 weeks ago.   Thanks

## 2019-04-10 ENCOUNTER — Other Ambulatory Visit: Payer: Self-pay | Admitting: Infectious Diseases

## 2019-04-10 ENCOUNTER — Encounter: Payer: Self-pay | Admitting: Infectious Diseases

## 2019-04-10 ENCOUNTER — Encounter: Payer: Self-pay | Admitting: Internal Medicine

## 2019-04-10 ENCOUNTER — Telehealth: Payer: Self-pay | Admitting: Internal Medicine

## 2019-04-10 ENCOUNTER — Other Ambulatory Visit: Payer: Self-pay

## 2019-04-10 ENCOUNTER — Ambulatory Visit (INDEPENDENT_AMBULATORY_CARE_PROVIDER_SITE_OTHER): Payer: Medicare Other | Admitting: Infectious Diseases

## 2019-04-10 ENCOUNTER — Ambulatory Visit (INDEPENDENT_AMBULATORY_CARE_PROVIDER_SITE_OTHER): Payer: Medicare Other | Admitting: Internal Medicine

## 2019-04-10 VITALS — BP 151/90 | HR 94 | Wt 196.0 lb

## 2019-04-10 VITALS — BP 186/81 | HR 89 | Temp 98.1°F | Ht 63.0 in | Wt 197.0 lb

## 2019-04-10 DIAGNOSIS — Z23 Encounter for immunization: Secondary | ICD-10-CM | POA: Diagnosis not present

## 2019-04-10 DIAGNOSIS — I10 Essential (primary) hypertension: Secondary | ICD-10-CM

## 2019-04-10 DIAGNOSIS — Z113 Encounter for screening for infections with a predominantly sexual mode of transmission: Secondary | ICD-10-CM

## 2019-04-10 DIAGNOSIS — F451 Undifferentiated somatoform disorder: Secondary | ICD-10-CM

## 2019-04-10 DIAGNOSIS — B20 Human immunodeficiency virus [HIV] disease: Secondary | ICD-10-CM

## 2019-04-10 DIAGNOSIS — Z79899 Other long term (current) drug therapy: Secondary | ICD-10-CM

## 2019-04-10 DIAGNOSIS — R0789 Other chest pain: Secondary | ICD-10-CM | POA: Diagnosis not present

## 2019-04-10 DIAGNOSIS — Z0000 Encounter for general adult medical examination without abnormal findings: Secondary | ICD-10-CM

## 2019-04-10 DIAGNOSIS — E669 Obesity, unspecified: Secondary | ICD-10-CM

## 2019-04-10 DIAGNOSIS — B182 Chronic viral hepatitis C: Secondary | ICD-10-CM | POA: Diagnosis not present

## 2019-04-10 DIAGNOSIS — F419 Anxiety disorder, unspecified: Secondary | ICD-10-CM

## 2019-04-10 DIAGNOSIS — M25512 Pain in left shoulder: Secondary | ICD-10-CM

## 2019-04-10 DIAGNOSIS — E785 Hyperlipidemia, unspecified: Secondary | ICD-10-CM | POA: Diagnosis not present

## 2019-04-10 DIAGNOSIS — Z6834 Body mass index (BMI) 34.0-34.9, adult: Secondary | ICD-10-CM

## 2019-04-10 MED ORDER — AMLODIPINE BESYLATE 5 MG PO TABS: 5.0000 mg | ORAL_TABLET | Freq: Every day | ORAL | 1 refills | Status: DC

## 2019-04-10 MED ORDER — LISINOPRIL-HYDROCHLOROTHIAZIDE 20-25 MG PO TABS: 1.0000 | ORAL_TABLET | Freq: Every day | ORAL | 1 refills | Status: DC

## 2019-04-10 NOTE — Assessment & Plan Note (Signed)
RNA (-) 2017 and 2019.  Prev u/s was F3-4.  Will repeat her u/s.
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
## 2019-04-10 NOTE — Progress Notes (Signed)
   CC: Follow up HTN  HPI:  [name] is a 64 y.o. with medical problems listed below presenting to follow-up on chronic medical problems.  Please see problem based charting for further details.   Past Medical History:  Diagnosis Date  . Anxiety   . Bronchitis   . Depression   . Excessive cerumen in both ear canals 07/02/2017  . H/O drug abuse ([place])    IV drugs, drug free 12 years  . Hepatitis    Hep. C  . History of suicidal ideation    thought about overdose in the past  . HIV infection ([place])   . Hypertension   . Neuropathy   . Sleep difficulties   . Trichomonal infection    2009   Review of Systems:   Review of Systems  Constitutional: Negative.   Eyes: Negative for blurred vision and double vision.  Respiratory: Negative for shortness of breath.   Cardiovascular: Negative.  Negative for chest pain and leg swelling.  Gastrointestinal: Negative.   Genitourinary: Negative.   Musculoskeletal: Positive for joint pain (Left shoulder).  Neurological: Negative for dizziness and headaches.  Psychiatric/Behavioral:       Stressed     Physical Exam:  Vitals:   04/10/19 1312  BP: (!) 186/81  Pulse: 89  Temp: 98.1 F (36.7 C)  TempSrc: Oral  SpO2: 100%  Weight: 197 lb (89.4 kg)  Height: 5\' 3"  (1.6 m)   Physical Exam  Constitutional: She is well-developed, well-nourished, and in no distress.  Cardiovascular: Normal rate, regular rhythm and normal heart sounds.  Pulmonary/Chest: Effort normal and breath sounds normal. No respiratory distress. She has no wheezes.  Musculoskeletal:        General: Tenderness (Mild (Left shoudler)) present. No deformity or edema.  Psychiatric: Her mood appears anxious. She is agitated.    Assessment & Plan:   See Encounters Tab for problem based charting.  Patient discussed with Dr. Philipp Ovens

## 2019-04-10 NOTE — Patient Instructions (Addendum)
Sharon Norris,   Thanks for seeing [place] today. Here are my recommendations today  1)Please record your blood pressures twice a day for 1 week. Return to the clinic in 1 week to follow up on your high blood pressure with either myself or the acute care clinic  2)I am referring you to the heart doctor as we already discussed.   Take care! Dr. Eileen Stanford  Please call the internal medicine center clinic if you have any questions or concerns, we may be able to help and keep you from a long and expensive emergency room wait. Our clinic and after hours phone number is [phone], the best time to call is Monday through Friday 9 am to 4 pm but there is always someone available 24/7 if you have an emergency. If you need medication refills please notify your pharmacy one week in advance and they will send [place] a request.

## 2019-04-10 NOTE — Progress Notes (Signed)
Internal Medicine Clinic Attending  Case discussed with Dr. Agyei at the time of the visit.  We reviewed the resident's history and exam and pertinent patient test results.  I agree with the assessment, diagnosis, and plan of care documented in the resident's note.    

## 2019-04-10 NOTE — Assessment & Plan Note (Signed)
Will check her labs today Dental, pap, mammo, colon,  Offered/refused condoms.  Appears to be doing well? Needs to watch diet and exercise.  Refuses flu vax.  rtc in 6 months.

## 2019-04-10 NOTE — Telephone Encounter (Signed)
Both refills were sent today via [place] per Dr. Eileen Stanford to [facility]. SChaplin, RN,BSN

## 2019-04-10 NOTE — Assessment & Plan Note (Signed)
Elevated today Has appt with PCP today so will defer to them, she is on triple rx.

## 2019-04-10 NOTE — Telephone Encounter (Signed)
Refill Request-Pt seen today and wants to make sure her BP meds are called in to the correct Pharmacy listed below.  lisinopril-hydrochlorothiazide (ZESTORETIC) 20-25 MG tablet amLODipine (NORVASC) 5 MG tablet  [facility] - [place], [place] - [address]

## 2019-04-10 NOTE — Assessment & Plan Note (Addendum)
Atypical chest/left shoulder pain: She states that for about a month now she experiences left shoulder pain with ambulation.  The pain is throbbing in nature and improves with aspirin and also when she puts her arm on her chest.  She states that at the onset of the pain, it would radiate to her fingers.  She denies chest pain, shortness of breath, nausea, vomiting, dizziness, lightheadedness, diaphoresis or any constellation of ACS.  Her pain sounds quite atypical.  She does have several risk factors for CAD including hypertension, hyperlipidemia and obesity.   Plan: -Will refer to cardiology for stress test

## 2019-04-10 NOTE — Assessment & Plan Note (Signed)
Hypertension: Labile.   Blood pressures in clinic today. 1.  186/81, pulse 89 2.  125/114, pulse 90 3.  151/111, pulse 85 (right arm) 4.  169/109, pulse 90 (left arm)  In the past, her BPs have been as low as 91/60 with a pulse of 103.  She tells me today that she has been experiencing several family stressors recently.  She has been living with her granddaughter for about 6 months who has 6 children.  They have been arguing recently and she is worried because her house is messy.  They live in a 1 bedroom apartment and does not have much space to accommodate all occupants.  Her granddaughter does not get much support from the father's of her children.  In addition, she has gained close to 20 pounds since June 2020.  Plan: -Check home BPs twice a day for 1 week -Return to clinic in 1 week for hypertension follow-up with either myself or [facility] -Counseled on weight loss measures -Continue amlodipine 5 mg daily, lisinopril-hydrochlorothiazide 20-25 mg daily

## 2019-04-10 NOTE — Assessment & Plan Note (Signed)
Will get her in with: Dental PAP Mammo  Colon

## 2019-04-13 LAB — COMPREHENSIVE METABOLIC PANEL
AG Ratio: 1.1 (calc) (ref 1.0–2.5)
ALT: 17 U/L (ref 6–29)
AST: 20 U/L (ref 10–35)
Albumin: 4.2 g/dL (ref 3.6–5.1)
Alkaline phosphatase (APISO): 78 U/L (ref 37–153)
BUN/Creatinine Ratio: 18 (calc) (ref 6–22)
BUN: 30 mg/dL — ABNORMAL HIGH (ref 7–25)
CO2: 19 mmol/L — ABNORMAL LOW (ref 20–32)
Calcium: 9.5 mg/dL (ref 8.6–10.4)
Chloride: 109 mmol/L (ref 98–110)
Creat: 1.67 mg/dL — ABNORMAL HIGH (ref 0.50–0.99)
Globulin: 3.7 g/dL (calc) (ref 1.9–3.7)
Glucose, Bld: 90 mg/dL (ref 65–99)
Potassium: 4.4 mmol/L (ref 3.5–5.3)
Sodium: 138 mmol/L (ref 135–146)
Total Bilirubin: 0.3 mg/dL (ref 0.2–1.2)
Total Protein: 7.9 g/dL (ref 6.1–8.1)

## 2019-04-13 LAB — HIV-1 RNA QUANT-NO REFLEX-BLD
HIV 1 RNA Quant: 75 copies/mL — ABNORMAL HIGH
HIV-1 RNA Quant, Log: 1.88 Log copies/mL — ABNORMAL HIGH

## 2019-04-13 LAB — CBC
HCT: 35.7 % (ref 35.0–45.0)
Hemoglobin: 12.1 g/dL (ref 11.7–15.5)
MCH: 33.3 pg — ABNORMAL HIGH (ref 27.0–33.0)
MCHC: 33.9 g/dL (ref 32.0–36.0)
MCV: 98.3 fL (ref 80.0–100.0)
MPV: 11.6 fL (ref 7.5–12.5)
Platelets: 246 10*3/uL (ref 140–400)
RBC: 3.63 10*6/uL — ABNORMAL LOW (ref 3.80–5.10)
RDW: 12.8 % (ref 11.0–15.0)
WBC: 9.6 10*3/uL (ref 3.8–10.8)

## 2019-04-13 LAB — RPR: RPR Ser Ql: NONREACTIVE

## 2019-04-20 ENCOUNTER — Inpatient Hospital Stay: Admission: RE | Admit: 2019-04-20 | Payer: Medicare Other | Source: Ambulatory Visit

## 2019-04-25 ENCOUNTER — Other Ambulatory Visit: Payer: Self-pay | Admitting: Internal Medicine

## 2019-04-30 ENCOUNTER — Emergency Department (HOSPITAL_COMMUNITY)
Admission: EM | Admit: 2019-04-30 | Discharge: 2019-04-30 | Disposition: A | Discharge status: Home / Self Care | Payer: Medicare Other | Attending: Emergency Medicine | Admitting: Emergency Medicine

## 2019-04-30 ENCOUNTER — Other Ambulatory Visit: Payer: Self-pay

## 2019-04-30 ENCOUNTER — Encounter (HOSPITAL_COMMUNITY): Payer: Self-pay | Admitting: Emergency Medicine

## 2019-04-30 DIAGNOSIS — Z79899 Other long term (current) drug therapy: Secondary | ICD-10-CM | POA: Insufficient documentation

## 2019-04-30 DIAGNOSIS — R404 Transient alteration of awareness: Secondary | ICD-10-CM | POA: Diagnosis not present

## 2019-04-30 DIAGNOSIS — I1310 Hypertensive heart and chronic kidney disease without heart failure, with stage 1 through stage 4 chronic kidney disease, or unspecified chronic kidney disease: Secondary | ICD-10-CM | POA: Insufficient documentation

## 2019-04-30 DIAGNOSIS — F10920 Alcohol use, unspecified with intoxication, uncomplicated: Secondary | ICD-10-CM | POA: Insufficient documentation

## 2019-04-30 DIAGNOSIS — T887XXA Unspecified adverse effect of drug or medicament, initial encounter: Secondary | ICD-10-CM | POA: Diagnosis not present

## 2019-04-30 DIAGNOSIS — Z87891 Personal history of nicotine dependence: Secondary | ICD-10-CM | POA: Diagnosis not present

## 2019-04-30 DIAGNOSIS — T50904A Poisoning by unspecified drugs, medicaments and biological substances, undetermined, initial encounter: Secondary | ICD-10-CM | POA: Diagnosis not present

## 2019-04-30 DIAGNOSIS — N183 Chronic kidney disease, stage 3 unspecified: Secondary | ICD-10-CM | POA: Insufficient documentation

## 2019-04-30 DIAGNOSIS — R4020 Unspecified coma: Secondary | ICD-10-CM | POA: Diagnosis not present

## 2019-04-30 DIAGNOSIS — Z743 Need for continuous supervision: Secondary | ICD-10-CM | POA: Diagnosis not present

## 2019-04-30 LAB — COMPREHENSIVE METABOLIC PANEL
ALT: 16 U/L (ref 0–44)
AST: 17 U/L (ref 15–41)
Albumin: 3.8 g/dL (ref 3.5–5.0)
Alkaline Phosphatase: 70 U/L (ref 38–126)
Anion gap: 14 (ref 5–15)
BUN: 42 mg/dL — ABNORMAL HIGH (ref 8–23)
CO2: 18 mmol/L — ABNORMAL LOW (ref 22–32)
Calcium: 8.9 mg/dL (ref 8.9–10.3)
Chloride: 105 mmol/L (ref 98–111)
Creatinine, Ser: 2.47 mg/dL — ABNORMAL HIGH (ref 0.44–1.00)
GFR calc Af Amer: 23 mL/min — ABNORMAL LOW (ref >60)
GFR calc non Af Amer: 20 mL/min — ABNORMAL LOW (ref >60)
Glucose, Bld: 149 mg/dL — ABNORMAL HIGH (ref 70–99)
Potassium: 4.9 mmol/L (ref 3.5–5.1)
Sodium: 137 mmol/L (ref 135–145)
Total Bilirubin: 0.3 mg/dL (ref 0.3–1.2)
Total Protein: 8.5 g/dL — ABNORMAL HIGH (ref 6.5–8.1)

## 2019-04-30 LAB — ETHANOL: Alcohol, Ethyl (B): 68 mg/dL — ABNORMAL HIGH (ref <10)

## 2019-04-30 LAB — CBC
HCT: 37.9 % (ref 36.0–46.0)
Hemoglobin: 11.9 g/dL — ABNORMAL LOW (ref 12.0–15.0)
MCH: 33.3 pg (ref 26.0–34.0)
MCHC: 31.4 g/dL (ref 30.0–36.0)
MCV: 106.2 fL — ABNORMAL HIGH (ref 80.0–100.0)
Platelets: 298 10*3/uL (ref 150–400)
RBC: 3.57 MIL/uL — ABNORMAL LOW (ref 3.87–5.11)
RDW: 13.2 % (ref 11.5–15.5)
WBC: 17.9 10*3/uL — ABNORMAL HIGH (ref 4.0–10.5)
nRBC: 0 % (ref 0.0–0.2)

## 2019-04-30 LAB — SALICYLATE LEVEL: Salicylate Lvl: <7 mg/dL — ABNORMAL LOW (ref 7.0–30.0)

## 2019-04-30 MED ORDER — SODIUM CHLORIDE 0.9 % IV BOLUS
1000.0000 mL | Freq: Once | INTRAVENOUS | Status: AC
  Administered 2019-04-30: 1000 mL via INTRAVENOUS

## 2019-04-30 MED ORDER — ACETAMINOPHEN 325 MG PO TABS: 650.0000 mg | ORAL_TABLET | Freq: Once | ORAL | Status: DC

## 2019-04-30 NOTE — ED Triage Notes (Signed)
Friend called EMS because of patient was unresponsive, did a lot of drinking, EMS gave 2 mg Narcan Intranasal. Then woke up in 5 minutes, alert and oriented but still very sleepy.

## 2019-04-30 NOTE — ED Provider Notes (Signed)
[facility] DEPT Provider Note   CSN: [id number] Arrival date & time: 04/30/19  3664     History Chief Complaint  Patient presents with  . Alcohol Intoxication    Sharon Norris is a 64 y.o. female.  HPI Patient presents to the emergency department with altered mental status and alcohol taxation.  The patient sent by EMS to be less than responsive.  The patient and her friend had been drinking a fair amount last night.  The patient was given Narcan by EMS 2 mg intranasally.  EMS states that it about 5 minutes later she was a lot more alert and oriented than previously.  The patient has been very somnolent on my examination.  The patient will arouse and talk to me and answer questions.  The patient states she has no complaints at this time.  The patient denies chest pain, shortness of breath, headache,blurred vision, neck pain, fever, cough, weakness, numbness, dizziness, anorexia, edema, abdominal pain, nausea, vomiting, diarrhea, rash, back pain, dysuria, hematemesis, bloody stool, near syncope, or syncope.    Past Medical History:  Diagnosis Date  . Anxiety   . Bronchitis   . Depression   . Excessive cerumen in both ear canals 07/02/2017  . H/O drug abuse ([place])    IV drugs, drug free 12 years  . Hepatitis    Hep. C  . History of suicidal ideation    thought about overdose in the past  . HIV infection ([place])   . Hypertension   . Neuropathy   . Sleep difficulties   . Trichomonal infection    2009    Patient Active Problem List   Diagnosis Date Noted  . Atypical chest pain/L. Shoulder pain 04/10/2019  . CKD (chronic kidney disease), stage III 08/25/2018  . Constipated 06/28/2018  . Fall 07/13/2017  . Poor dentition 04/08/2016  . Health care maintenance 09/05/2013  . Psychiatric disorder 09/17/2010  . Peripheral neuropathy ([place]) 04/02/2009  . Insomnia 01/15/2009  . Chronic back pain 12/12/2007  . Human immunodeficiency virus (HIV) disease  ([place]) 12/23/2005  . History of Chronic hepatitis C without hepatic coma ([place]) 12/23/2005  . Essential hypertension 12/23/2005    Past Surgical History:  Procedure Laterality Date  . COLPOSCOPY  2010     OB History    Gravida  3   Para  2   Term  2   Preterm      AB  1   Living        SAB      TAB  1   Ectopic      Multiple      Live Births              Family History  Problem Relation Age of Onset  . Alcohol abuse Father   . Cancer Maternal Aunt        endometrial  . Cancer Sister        endometrial  . Cancer Sister        breast    Social History   Tobacco Use  . Smoking status: Former Smoker    Quit date: 02/17/2000    Years since quitting: 19.2  . Smokeless tobacco: Never Used  Substance Use Topics  . Alcohol use: Yes    Alcohol/week: 0.0 standard drinks    Comment: beer, occasional  . Drug use: No    Home Medications Prior to Admission medications   Medication Sig Start Date End Date Taking? Authorizing  Provider  aluminum sulfate-calcium acetate (DOMEBORO) packet Apply 1 packet topically 3 (three) times daily. 01/06/19   Tasia Catchings, Amy V, PA-C  amLODipine (NORVASC) 5 MG tablet Take 1 tablet (5 mg total) by mouth daily. 04/10/19   [name], MD  divalproex (DEPAKOTE ER) 500 MG 24 hr tablet Take 1 tablet (500 mg total) by mouth daily. 05/06/18   Agyei, Caprice Kluver, MD  gabapentin (NEURONTIN) 300 MG capsule TAKE TWO CAPSULES BY MOUTH TWICE A DAY 02/06/19   Agyei, Caprice Kluver, MD  lisinopril-hydrochlorothiazide (ZESTORETIC) 20-25 MG tablet Take 1 tablet by mouth daily. 04/10/19   [name], MD  TRIUMEQ 600-50-300 MG tablet TAKE ONE TABLET BY MOUTH DAILY 04/10/19   Campbell Riches, MD    Allergies    Benadryl [diphenhydramine hcl], Ribavirin, and Viekira pak [ombitasvir-paritaprevir-ritonavir-dasabuvir]  Review of Systems   Review of Systems All other systems negative except as documented in the HPI. All pertinent positives and negatives as reviewed  in the HPI. Physical Exam Updated Vital Signs BP 129/73   Pulse 72   Temp 97.6 F (36.4 C) (Oral)   Resp 11   SpO2 95%   Physical Exam Vitals and nursing note reviewed.  Constitutional:      General: She is not in acute distress.    Appearance: She is well-developed.  HENT:     Head: Normocephalic and atraumatic.  Eyes:     Pupils: Pupils are equal, round, and reactive to light.  Cardiovascular:     Rate and Rhythm: Normal rate and regular rhythm.     Heart sounds: Normal heart sounds. No murmur. No friction rub. No gallop.   Pulmonary:     Effort: Pulmonary effort is normal. No respiratory distress.     Breath sounds: Normal breath sounds. No wheezing.  Abdominal:     General: Bowel sounds are normal. There is no distension.     Palpations: Abdomen is soft.     Tenderness: There is no abdominal tenderness.  Musculoskeletal:     Cervical back: Normal range of motion and neck supple.  Skin:    General: Skin is warm and dry.     Capillary Refill: Capillary refill takes less than 2 seconds.     Findings: No erythema or rash.  Neurological:     Mental Status: She is alert and oriented to person, place, and time.     Motor: No abnormal muscle tone.     Coordination: Coordination normal.  Psychiatric:        Behavior: Behavior normal.     ED Results / Procedures / Treatments   Labs (all labs ordered are listed, but only abnormal results are displayed) Labs Reviewed  ETHANOL - Abnormal; Notable for the following components:      Result Value   Alcohol, Ethyl (B) 68 (*)    All other components within normal limits  SALICYLATE LEVEL - Abnormal; Notable for the following components:   Salicylate Lvl <0.3 (*)    All other components within normal limits  CBC - Abnormal; Notable for the following components:   WBC 17.9 (*)    RBC 3.57 (*)    Hemoglobin 11.9 (*)    MCV 106.2 (*)    All other components within normal limits  COMPREHENSIVE METABOLIC PANEL - Abnormal;  Notable for the following components:   CO2 18 (*)    Glucose, Bld 149 (*)    BUN 42 (*)    Creatinine, Ser 2.47 (*)  Total Protein 8.5 (*)    GFR calc non Af Amer 20 (*)    GFR calc Af Amer 23 (*)    All other components within normal limits  URINALYSIS, ROUTINE W REFLEX MICROSCOPIC  RAPID URINE DRUG SCREEN, HOSP PERFORMED    EKG None  Radiology No results found.  Procedures Procedures (including critical care time)  Medications Ordered in ED Medications  sodium chloride 0.9 % bolus 1,000 mL (1,000 mLs Intravenous New Bag/Given 04/30/19 5102)    ED Course  I have reviewed the triage vital signs and the nursing notes.  Pertinent labs & imaging results that were available during my care of the patient were reviewed by me and considered in my medical decision making (see chart for details).    MDM Rules/Calculators/A&P                      Patient was logical here in the emergency department.  She has been able to continue to maintain good mentation and she was able to ambulate without difficulty.  Patient has maintained good vital signs in the emergency department as well.  Patient did have a slight bump in her creatinine but was given a liter of fluid in the emergency department.  Patient has chronic renal impairment which is a little bit higher than previous today.  She appears to be usually around 1 6 she was at 2.4 today.  I feel that the fluids may have helped her with this issue.  The patient will need to be rechecked by her doctor and have her creatinine reassessed.  This point I feel the patient does not need admission to the hospital she is ambulating and mentating appropriately.  Patient was drinking fairly heavily last night.  Final Clinical Impression(s) / ED Diagnoses Final diagnoses:  None    Rx / DC Orders ED Discharge Orders    None       Dalia Heading, PA-C 04/30/19 1437    Malvin Johns, MD 05/08/19 [phone]

## 2019-04-30 NOTE — Discharge Instructions (Addendum)
Return here as needed.  Follow-up with your primary doctor.  You will need to have your kidney function rechecked.

## 2019-04-30 NOTE — ED Notes (Signed)
purwick placed on patient 

## 2019-04-30 NOTE — ED Notes (Signed)
Pt ambulated to bathroom not able to void

## 2019-05-03 ENCOUNTER — Ambulatory Visit: Payer: Medicare Other | Admitting: Infectious Diseases

## 2019-05-09 ENCOUNTER — Other Ambulatory Visit: Payer: Medicare Other

## 2019-05-22 ENCOUNTER — Ambulatory Visit: Payer: Self-pay | Admitting: Cardiology

## 2019-05-23 ENCOUNTER — Other Ambulatory Visit: Payer: Self-pay | Admitting: Internal Medicine

## 2019-05-23 DIAGNOSIS — Z0000 Encounter for general adult medical examination without abnormal findings: Secondary | ICD-10-CM

## 2019-05-26 ENCOUNTER — Encounter: Payer: Medicare Other | Admitting: Internal Medicine

## 2019-06-02 ENCOUNTER — Ambulatory Visit: Payer: Self-pay | Admitting: Cardiology

## 2019-06-06 ENCOUNTER — Telehealth: Payer: Self-pay

## 2019-06-06 NOTE — Telephone Encounter (Signed)
NS for PV.  Called left message to return call to avoid cancellation of procedure.

## 2019-06-06 NOTE — Telephone Encounter (Signed)
No return call by 5:00. Procedure cancelled awaiting pt to r/s. 

## 2019-06-12 ENCOUNTER — Other Ambulatory Visit: Payer: Self-pay | Admitting: Internal Medicine

## 2019-06-12 DIAGNOSIS — E2839 Other primary ovarian failure: Secondary | ICD-10-CM

## 2019-06-20 ENCOUNTER — Encounter: Payer: Medicare Other | Admitting: Internal Medicine

## 2019-06-21 ENCOUNTER — Encounter: Payer: Self-pay | Admitting: Internal Medicine

## 2019-07-12 ENCOUNTER — Other Ambulatory Visit: Payer: Self-pay | Admitting: Internal Medicine

## 2019-07-12 DIAGNOSIS — G620 Drug-induced polyneuropathy: Secondary | ICD-10-CM

## 2019-07-12 DIAGNOSIS — I10 Essential (primary) hypertension: Secondary | ICD-10-CM

## 2019-07-13 ENCOUNTER — Other Ambulatory Visit: Payer: Self-pay | Admitting: Internal Medicine

## 2019-07-13 DIAGNOSIS — F99 Mental disorder, not otherwise specified: Secondary | ICD-10-CM

## 2019-07-20 ENCOUNTER — Other Ambulatory Visit: Payer: Self-pay | Admitting: Internal Medicine

## 2019-07-20 DIAGNOSIS — G620 Drug-induced polyneuropathy: Secondary | ICD-10-CM

## 2019-08-14 ENCOUNTER — Other Ambulatory Visit: Payer: Medicare Other

## 2019-09-28 ENCOUNTER — Ambulatory Visit: Payer: Medicare Other | Admitting: Infectious Diseases

## 2019-10-03 ENCOUNTER — Ambulatory Visit: Payer: Medicare Other | Admitting: Infectious Diseases

## 2019-10-10 ENCOUNTER — Other Ambulatory Visit: Payer: Self-pay | Admitting: Infectious Diseases

## 2019-10-10 DIAGNOSIS — B20 Human immunodeficiency virus [HIV] disease: Secondary | ICD-10-CM

## 2019-10-12 ENCOUNTER — Ambulatory Visit: Payer: Medicare Other | Admitting: Infectious Diseases

## 2019-10-25 ENCOUNTER — Ambulatory Visit
Admission: RE | Admit: 2019-10-25 | Discharge: 2019-10-25 | Disposition: A | Discharge status: Home / Self Care | Payer: Medicare Other | Source: Ambulatory Visit | Attending: Infectious Diseases | Admitting: Infectious Diseases

## 2019-10-25 DIAGNOSIS — B182 Chronic viral hepatitis C: Secondary | ICD-10-CM

## 2019-11-01 ENCOUNTER — Encounter: Payer: Self-pay | Admitting: Infectious Diseases

## 2019-11-01 ENCOUNTER — Other Ambulatory Visit: Payer: Self-pay

## 2019-11-01 ENCOUNTER — Telehealth (INDEPENDENT_AMBULATORY_CARE_PROVIDER_SITE_OTHER): Payer: Medicare Other | Admitting: Infectious Diseases

## 2019-11-01 DIAGNOSIS — I10 Essential (primary) hypertension: Secondary | ICD-10-CM | POA: Diagnosis not present

## 2019-11-01 DIAGNOSIS — Z79899 Other long term (current) drug therapy: Secondary | ICD-10-CM

## 2019-11-01 DIAGNOSIS — B182 Chronic viral hepatitis C: Secondary | ICD-10-CM

## 2019-11-01 DIAGNOSIS — Z113 Encounter for screening for infections with a predominantly sexual mode of transmission: Secondary | ICD-10-CM

## 2019-11-01 DIAGNOSIS — B20 Human immunodeficiency virus [HIV] disease: Secondary | ICD-10-CM

## 2019-11-01 DIAGNOSIS — N183 Chronic kidney disease, stage 3 unspecified: Secondary | ICD-10-CM | POA: Diagnosis not present

## 2019-11-01 NOTE — Assessment & Plan Note (Signed)
She appears to be doing well Will have her come in for in person visit in 3 months Will check her labs prior to that.  Assure covid and flu vax at her f/u visit.

## 2019-11-01 NOTE — Assessment & Plan Note (Signed)
Will recheck at her next f/u visit.
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
## 2019-11-01 NOTE — Assessment & Plan Note (Signed)
Appreciate her f/u with IMTS. Appears to be well controlled by her home monitoring.

## 2019-11-01 NOTE — Assessment & Plan Note (Signed)
Her u/s does not show liver damage.  Will cont to follow clinically.

## 2019-11-21 ENCOUNTER — Inpatient Hospital Stay (HOSPITAL_COMMUNITY): Payer: Medicare Other

## 2019-11-21 ENCOUNTER — Emergency Department (HOSPITAL_COMMUNITY): Payer: Medicare Other

## 2019-11-21 ENCOUNTER — Other Ambulatory Visit: Payer: Self-pay

## 2019-11-21 ENCOUNTER — Encounter (HOSPITAL_COMMUNITY): Payer: Self-pay

## 2019-11-21 ENCOUNTER — Inpatient Hospital Stay (HOSPITAL_COMMUNITY)
Admission: EM | Admit: 2019-11-21 | Discharge: 2019-11-30 | DRG: 917 | Disposition: A | Discharge status: Home Health | Payer: Medicare Other | Attending: Student | Admitting: Student

## 2019-11-21 ENCOUNTER — Encounter (HOSPITAL_COMMUNITY): Payer: Medicare Other

## 2019-11-21 DIAGNOSIS — F1410 Cocaine abuse, uncomplicated: Secondary | ICD-10-CM | POA: Diagnosis present

## 2019-11-21 DIAGNOSIS — R9431 Abnormal electrocardiogram [ECG] [EKG]: Secondary | ICD-10-CM | POA: Diagnosis present

## 2019-11-21 DIAGNOSIS — G9341 Metabolic encephalopathy: Secondary | ICD-10-CM | POA: Diagnosis not present

## 2019-11-21 DIAGNOSIS — E669 Obesity, unspecified: Secondary | ICD-10-CM | POA: Diagnosis present

## 2019-11-21 DIAGNOSIS — R001 Bradycardia, unspecified: Secondary | ICD-10-CM | POA: Diagnosis present

## 2019-11-21 DIAGNOSIS — I6389 Other cerebral infarction: Secondary | ICD-10-CM | POA: Diagnosis not present

## 2019-11-21 DIAGNOSIS — F17200 Nicotine dependence, unspecified, uncomplicated: Secondary | ICD-10-CM | POA: Diagnosis not present

## 2019-11-21 DIAGNOSIS — I129 Hypertensive chronic kidney disease with stage 1 through stage 4 chronic kidney disease, or unspecified chronic kidney disease: Secondary | ICD-10-CM | POA: Diagnosis present

## 2019-11-21 DIAGNOSIS — J189 Pneumonia, unspecified organism: Secondary | ICD-10-CM | POA: Diagnosis present

## 2019-11-21 DIAGNOSIS — J9811 Atelectasis: Secondary | ICD-10-CM | POA: Diagnosis not present

## 2019-11-21 DIAGNOSIS — R202 Paresthesia of skin: Secondary | ICD-10-CM | POA: Diagnosis not present

## 2019-11-21 DIAGNOSIS — I6340 Cerebral infarction due to embolism of unspecified cerebral artery: Secondary | ICD-10-CM | POA: Insufficient documentation

## 2019-11-21 DIAGNOSIS — I6611 Occlusion and stenosis of right anterior cerebral artery: Secondary | ICD-10-CM | POA: Diagnosis not present

## 2019-11-21 DIAGNOSIS — I739 Peripheral vascular disease, unspecified: Secondary | ICD-10-CM | POA: Diagnosis not present

## 2019-11-21 DIAGNOSIS — Z6835 Body mass index (BMI) 35.0-35.9, adult: Secondary | ICD-10-CM | POA: Diagnosis not present

## 2019-11-21 DIAGNOSIS — R079 Chest pain, unspecified: Secondary | ICD-10-CM | POA: Diagnosis not present

## 2019-11-21 DIAGNOSIS — Z20822 Contact with and (suspected) exposure to covid-19: Secondary | ICD-10-CM | POA: Diagnosis present

## 2019-11-21 DIAGNOSIS — Z21 Asymptomatic human immunodeficiency virus [HIV] infection status: Secondary | ICD-10-CM | POA: Diagnosis present

## 2019-11-21 DIAGNOSIS — N179 Acute kidney failure, unspecified: Secondary | ICD-10-CM | POA: Diagnosis present

## 2019-11-21 DIAGNOSIS — F10231 Alcohol dependence with withdrawal delirium: Secondary | ICD-10-CM | POA: Diagnosis present

## 2019-11-21 DIAGNOSIS — I63432 Cerebral infarction due to embolism of left posterior cerebral artery: Secondary | ICD-10-CM

## 2019-11-21 DIAGNOSIS — B20 Human immunodeficiency virus [HIV] disease: Secondary | ICD-10-CM | POA: Diagnosis not present

## 2019-11-21 DIAGNOSIS — D539 Nutritional anemia, unspecified: Secondary | ICD-10-CM | POA: Diagnosis not present

## 2019-11-21 DIAGNOSIS — R5381 Other malaise: Secondary | ICD-10-CM | POA: Diagnosis not present

## 2019-11-21 DIAGNOSIS — F10239 Alcohol dependence with withdrawal, unspecified: Secondary | ICD-10-CM | POA: Diagnosis present

## 2019-11-21 DIAGNOSIS — F17210 Nicotine dependence, cigarettes, uncomplicated: Secondary | ICD-10-CM | POA: Diagnosis present

## 2019-11-21 DIAGNOSIS — N1832 Chronic kidney disease, stage 3b: Secondary | ICD-10-CM

## 2019-11-21 DIAGNOSIS — D631 Anemia in chronic kidney disease: Secondary | ICD-10-CM | POA: Diagnosis not present

## 2019-11-21 DIAGNOSIS — Z781 Physical restraint status: Secondary | ICD-10-CM

## 2019-11-21 DIAGNOSIS — F419 Anxiety disorder, unspecified: Secondary | ICD-10-CM | POA: Diagnosis present

## 2019-11-21 DIAGNOSIS — Z743 Need for continuous supervision: Secondary | ICD-10-CM | POA: Diagnosis not present

## 2019-11-21 DIAGNOSIS — G928 Other toxic encephalopathy: Secondary | ICD-10-CM | POA: Diagnosis present

## 2019-11-21 DIAGNOSIS — R739 Hyperglycemia, unspecified: Secondary | ICD-10-CM | POA: Diagnosis not present

## 2019-11-21 DIAGNOSIS — I214 Non-ST elevation (NSTEMI) myocardial infarction: Principal | ICD-10-CM | POA: Diagnosis present

## 2019-11-21 DIAGNOSIS — R7303 Prediabetes: Secondary | ICD-10-CM | POA: Diagnosis present

## 2019-11-21 DIAGNOSIS — F32A Depression, unspecified: Secondary | ICD-10-CM | POA: Diagnosis not present

## 2019-11-21 DIAGNOSIS — I639 Cerebral infarction, unspecified: Secondary | ICD-10-CM | POA: Diagnosis not present

## 2019-11-21 DIAGNOSIS — R200 Anesthesia of skin: Secondary | ICD-10-CM | POA: Diagnosis not present

## 2019-11-21 DIAGNOSIS — R6510 Systemic inflammatory response syndrome (SIRS) of non-infectious origin without acute organ dysfunction: Secondary | ICD-10-CM | POA: Diagnosis present

## 2019-11-21 DIAGNOSIS — B1920 Unspecified viral hepatitis C without hepatic coma: Secondary | ICD-10-CM | POA: Diagnosis present

## 2019-11-21 DIAGNOSIS — I499 Cardiac arrhythmia, unspecified: Secondary | ICD-10-CM | POA: Diagnosis not present

## 2019-11-21 DIAGNOSIS — F10230 Alcohol dependence with withdrawal, uncomplicated: Secondary | ICD-10-CM | POA: Diagnosis not present

## 2019-11-21 DIAGNOSIS — E785 Hyperlipidemia, unspecified: Secondary | ICD-10-CM | POA: Diagnosis not present

## 2019-11-21 DIAGNOSIS — N184 Chronic kidney disease, stage 4 (severe): Secondary | ICD-10-CM | POA: Diagnosis not present

## 2019-11-21 DIAGNOSIS — I10 Essential (primary) hypertension: Secondary | ICD-10-CM | POA: Diagnosis not present

## 2019-11-21 DIAGNOSIS — R531 Weakness: Secondary | ICD-10-CM | POA: Diagnosis present

## 2019-11-21 DIAGNOSIS — R609 Edema, unspecified: Secondary | ICD-10-CM

## 2019-11-21 DIAGNOSIS — T405X1A Poisoning by cocaine, accidental (unintentional), initial encounter: Secondary | ICD-10-CM | POA: Diagnosis not present

## 2019-11-21 DIAGNOSIS — G629 Polyneuropathy, unspecified: Secondary | ICD-10-CM | POA: Diagnosis present

## 2019-11-21 DIAGNOSIS — R6889 Other general symptoms and signs: Secondary | ICD-10-CM | POA: Diagnosis not present

## 2019-11-21 DIAGNOSIS — I679 Cerebrovascular disease, unspecified: Secondary | ICD-10-CM | POA: Diagnosis present

## 2019-11-21 DIAGNOSIS — F10939 Alcohol use, unspecified with withdrawal, unspecified: Secondary | ICD-10-CM | POA: Diagnosis present

## 2019-11-21 DIAGNOSIS — R918 Other nonspecific abnormal finding of lung field: Secondary | ICD-10-CM | POA: Diagnosis not present

## 2019-11-21 LAB — COMPREHENSIVE METABOLIC PANEL
ALT: 23 U/L (ref 0–44)
ALT: 22 U/L (ref 0–44)
AST: 37 U/L (ref 15–41)
AST: 36 U/L (ref 15–41)
Albumin: 3.8 g/dL (ref 3.5–5.0)
Albumin: 3.5 g/dL (ref 3.5–5.0)
Alkaline Phosphatase: 68 U/L (ref 38–126)
Alkaline Phosphatase: 63 U/L (ref 38–126)
Anion gap: 17 — ABNORMAL HIGH (ref 5–15)
Anion gap: 12 (ref 5–15)
BUN: 39 mg/dL — ABNORMAL HIGH (ref 8–23)
BUN: 37 mg/dL — ABNORMAL HIGH (ref 8–23)
CO2: 18 mmol/L — ABNORMAL LOW (ref 22–32)
CO2: 18 mmol/L — ABNORMAL LOW (ref 22–32)
Calcium: 9.1 mg/dL (ref 8.9–10.3)
Calcium: 8.5 mg/dL — ABNORMAL LOW (ref 8.9–10.3)
Chloride: 109 mmol/L (ref 98–111)
Chloride: 109 mmol/L (ref 98–111)
Creatinine, Ser: 2.28 mg/dL — ABNORMAL HIGH (ref 0.44–1.00)
Creatinine, Ser: 2.04 mg/dL — ABNORMAL HIGH (ref 0.44–1.00)
GFR calc Af Amer: 25 mL/min — ABNORMAL LOW (ref >60)
GFR calc Af Amer: 29 mL/min — ABNORMAL LOW (ref >60)
GFR calc non Af Amer: 22 mL/min — ABNORMAL LOW (ref >60)
GFR calc non Af Amer: 25 mL/min — ABNORMAL LOW (ref >60)
Glucose, Bld: 224 mg/dL — ABNORMAL HIGH (ref 70–99)
Glucose, Bld: 180 mg/dL — ABNORMAL HIGH (ref 70–99)
Potassium: 3.8 mmol/L (ref 3.5–5.1)
Potassium: 3.7 mmol/L (ref 3.5–5.1)
Sodium: 144 mmol/L (ref 135–145)
Sodium: 139 mmol/L (ref 135–145)
Total Bilirubin: 0.7 mg/dL (ref 0.3–1.2)
Total Bilirubin: 0.5 mg/dL (ref 0.3–1.2)
Total Protein: 8.7 g/dL — ABNORMAL HIGH (ref 6.5–8.1)
Total Protein: 7.6 g/dL (ref 6.5–8.1)

## 2019-11-21 LAB — RESPIRATORY PANEL BY RT PCR (FLU A&B, COVID)
Influenza A by PCR: NEGATIVE
Influenza B by PCR: NEGATIVE
SARS Coronavirus 2 by RT PCR: NEGATIVE

## 2019-11-21 LAB — TROPONIN I (HIGH SENSITIVITY)
Troponin I (High Sensitivity): 1051 ng/L (ref <18)
Troponin I (High Sensitivity): 1493 ng/L (ref <18)
Troponin I (High Sensitivity): 3440 ng/L (ref <18)
Troponin I (High Sensitivity): 4942 ng/L (ref <18)
Troponin I (High Sensitivity): 4910 ng/L (ref <18)

## 2019-11-21 LAB — CBG MONITORING, ED
Glucose-Capillary: 179 mg/dL — ABNORMAL HIGH (ref 70–99)
Glucose-Capillary: 139 mg/dL — ABNORMAL HIGH (ref 70–99)
Glucose-Capillary: 185 mg/dL — ABNORMAL HIGH (ref 70–99)
Glucose-Capillary: 139 mg/dL — ABNORMAL HIGH (ref 70–99)

## 2019-11-21 LAB — CBC WITH DIFFERENTIAL/PLATELET
Abs Immature Granulocytes: 0.06 10*3/uL (ref 0.00–0.07)
Basophils Absolute: 0.1 10*3/uL (ref 0.0–0.1)
Basophils Relative: 0 %
Eosinophils Absolute: 0 10*3/uL (ref 0.0–0.5)
Eosinophils Relative: 0 %
HCT: 34.8 % — ABNORMAL LOW (ref 36.0–46.0)
Hemoglobin: 11.4 g/dL — ABNORMAL LOW (ref 12.0–15.0)
Immature Granulocytes: 0 %
Lymphocytes Relative: 8 %
Lymphs Abs: 1.2 10*3/uL (ref 0.7–4.0)
MCH: 33.4 pg (ref 26.0–34.0)
MCHC: 32.8 g/dL (ref 30.0–36.0)
MCV: 102.1 fL — ABNORMAL HIGH (ref 80.0–100.0)
Monocytes Absolute: 1.1 10*3/uL — ABNORMAL HIGH (ref 0.1–1.0)
Monocytes Relative: 7 %
Neutro Abs: 13 10*3/uL — ABNORMAL HIGH (ref 1.7–7.7)
Neutrophils Relative %: 85 %
Platelets: 238 10*3/uL (ref 150–400)
RBC: 3.41 MIL/uL — ABNORMAL LOW (ref 3.87–5.11)
RDW: 13.2 % (ref 11.5–15.5)
WBC: 15.5 10*3/uL — ABNORMAL HIGH (ref 4.0–10.5)
nRBC: 0 % (ref 0.0–0.2)

## 2019-11-21 LAB — ETHANOL: Alcohol, Ethyl (B): <10 mg/dL (ref <10)

## 2019-11-21 LAB — APTT: aPTT: 27 seconds (ref 24–36)

## 2019-11-21 LAB — HEPARIN LEVEL (UNFRACTIONATED): Heparin Unfractionated: 0.4 IU/mL (ref 0.30–0.70)

## 2019-11-21 LAB — PROTIME-INR
INR: 1 (ref 0.8–1.2)
Prothrombin Time: 13.1 seconds (ref 11.4–15.2)

## 2019-11-21 LAB — CBC
HCT: 32.8 % — ABNORMAL LOW (ref 36.0–46.0)
Hemoglobin: 10.3 g/dL — ABNORMAL LOW (ref 12.0–15.0)
MCH: 32.8 pg (ref 26.0–34.0)
MCHC: 31.4 g/dL (ref 30.0–36.0)
MCV: 104.5 fL — ABNORMAL HIGH (ref 80.0–100.0)
Platelets: 223 10*3/uL (ref 150–400)
RBC: 3.14 MIL/uL — ABNORMAL LOW (ref 3.87–5.11)
RDW: 13.3 % (ref 11.5–15.5)
WBC: 14.1 10*3/uL — ABNORMAL HIGH (ref 4.0–10.5)
nRBC: 0 % (ref 0.0–0.2)

## 2019-11-21 LAB — PHOSPHORUS: Phosphorus: 2.8 mg/dL (ref 2.5–4.6)

## 2019-11-21 LAB — HEMOGLOBIN A1C
Hgb A1c MFr Bld: 6.2 % — ABNORMAL HIGH (ref 4.8–5.6)
Mean Plasma Glucose: 131.24 mg/dL

## 2019-11-21 LAB — ECHOCARDIOGRAM COMPLETE
Area-P 1/2: 2.99 cm2
S' Lateral: 2.8 cm

## 2019-11-21 LAB — PROCALCITONIN: Procalcitonin: <0.1 ng/mL

## 2019-11-21 LAB — MRSA PCR SCREENING: MRSA by PCR: NEGATIVE

## 2019-11-21 LAB — GLUCOSE, CAPILLARY: Glucose-Capillary: 121 mg/dL — ABNORMAL HIGH (ref 70–99)

## 2019-11-21 LAB — MAGNESIUM: Magnesium: 2.2 mg/dL (ref 1.7–2.4)

## 2019-11-21 LAB — D-DIMER, QUANTITATIVE: D-Dimer, Quant: 0.96 ug/mL-FEU — ABNORMAL HIGH (ref 0.00–0.50)

## 2019-11-21 MED ORDER — ACETAMINOPHEN 325 MG PO TABS
650.0000 mg | ORAL_TABLET | ORAL | Status: DC | PRN
  Administered 2019-11-26: 650 mg via ORAL
  Filled 2019-11-21: qty 2

## 2019-11-21 MED ORDER — SODIUM CHLORIDE 0.9 % IV BOLUS
500.0000 mL | Freq: Once | INTRAVENOUS | Status: AC
  Administered 2019-11-21: 500 mL via INTRAVENOUS

## 2019-11-21 MED ORDER — METOPROLOL TARTRATE 25 MG PO TABS
25.0000 mg | ORAL_TABLET | Freq: Two times a day (BID) | ORAL | Status: DC
  Administered 2019-11-21: 25 mg via ORAL
  Filled 2019-11-21: qty 1

## 2019-11-21 MED ORDER — LORAZEPAM 1 MG PO TABS: 0.0000 mg | ORAL_TABLET | Freq: Four times a day (QID) | ORAL | Status: DC

## 2019-11-21 MED ORDER — ASPIRIN 81 MG PO CHEW
324.0000 mg | CHEWABLE_TABLET | Freq: Once | ORAL | Status: AC
  Administered 2019-11-21: 324 mg via ORAL
  Filled 2019-11-21: qty 4

## 2019-11-21 MED ORDER — HEPARIN BOLUS VIA INFUSION
4000.0000 [IU] | Freq: Once | INTRAVENOUS | Status: AC
  Administered 2019-11-21: 4000 [IU] via INTRAVENOUS
  Filled 2019-11-21: qty 4000

## 2019-11-21 MED ORDER — SODIUM CHLORIDE 0.9% FLUSH: 3.0000 mL | INTRAVENOUS | Status: DC | PRN

## 2019-11-21 MED ORDER — LORAZEPAM 2 MG/ML IJ SOLN
1.0000 mg | INTRAMUSCULAR | Status: AC | PRN
  Administered 2019-11-22 – 2019-11-23 (×9): 3 mg via INTRAVENOUS
  Administered 2019-11-23 – 2019-11-24 (×4): 4 mg via INTRAVENOUS
  Filled 2019-11-21 (×5): qty 2
  Filled 2019-11-21: qty 1
  Filled 2019-11-21 (×7): qty 2

## 2019-11-21 MED ORDER — LORAZEPAM 1 MG PO TABS
1.0000 mg | ORAL_TABLET | ORAL | Status: AC | PRN
  Administered 2019-11-22 (×3): 2 mg via ORAL
  Filled 2019-11-21 (×3): qty 2

## 2019-11-21 MED ORDER — SODIUM CHLORIDE 0.9 % IV SOLN
2.0000 g | INTRAVENOUS | Status: AC
  Administered 2019-11-21 – 2019-11-25 (×5): 2 g via INTRAVENOUS
  Filled 2019-11-21 (×2): qty 2
  Filled 2019-11-21: qty 20
  Filled 2019-11-21 (×2): qty 2

## 2019-11-21 MED ORDER — LORAZEPAM 2 MG/ML IJ SOLN
0.0000 mg | INTRAMUSCULAR | Status: AC
  Administered 2019-11-21 – 2019-11-22 (×3): 2 mg via INTRAVENOUS
  Filled 2019-11-21 (×4): qty 1

## 2019-11-21 MED ORDER — SODIUM CHLORIDE 0.9 % IV SOLN: 250.0000 mL | INTRAVENOUS | Status: DC | PRN

## 2019-11-21 MED ORDER — FOLIC ACID 1 MG PO TABS
1.0000 mg | ORAL_TABLET | Freq: Every day | ORAL | Status: DC
  Administered 2019-11-21 – 2019-11-23 (×3): 1 mg via ORAL
  Filled 2019-11-21 (×3): qty 1

## 2019-11-21 MED ORDER — SODIUM CHLORIDE 0.9% FLUSH
3.0000 mL | Freq: Two times a day (BID) | INTRAVENOUS | Status: DC
  Administered 2019-11-21 – 2019-11-22 (×3): 3 mL via INTRAVENOUS

## 2019-11-21 MED ORDER — LORAZEPAM 2 MG/ML IJ SOLN: 0.0000 mg | Freq: Two times a day (BID) | INTRAMUSCULAR | Status: DC

## 2019-11-21 MED ORDER — SODIUM CHLORIDE 0.9 % IV SOLN
100.0000 mg | Freq: Two times a day (BID) | INTRAVENOUS | Status: AC
  Administered 2019-11-21 – 2019-11-25 (×9): 100 mg via INTRAVENOUS
  Filled 2019-11-21 (×11): qty 100

## 2019-11-21 MED ORDER — ASPIRIN EC 81 MG PO TBEC
81.0000 mg | DELAYED_RELEASE_TABLET | Freq: Every day | ORAL | Status: DC
  Filled 2019-11-21 (×2): qty 1

## 2019-11-21 MED ORDER — NITROGLYCERIN IN D5W 200-5 MCG/ML-% IV SOLN
0.0000 ug/min | INTRAVENOUS | Status: DC
  Administered 2019-11-21: 10 ug/min via INTRAVENOUS
  Administered 2019-11-21 – 2019-11-22 (×2): 80 ug/min via INTRAVENOUS
  Filled 2019-11-21 (×2): qty 250

## 2019-11-21 MED ORDER — CARVEDILOL 6.25 MG PO TABS
6.2500 mg | ORAL_TABLET | Freq: Two times a day (BID) | ORAL | Status: DC
  Administered 2019-11-21 – 2019-11-22 (×2): 6.25 mg via ORAL
  Filled 2019-11-21 (×2): qty 1

## 2019-11-21 MED ORDER — HEPARIN (PORCINE) 25000 UT/250ML-% IV SOLN
1000.0000 [IU]/h | INTRAVENOUS | Status: DC
  Administered 2019-11-21 – 2019-11-22 (×2): 1000 [IU]/h via INTRAVENOUS
  Filled 2019-11-21 (×2): qty 250

## 2019-11-21 MED ORDER — THIAMINE HCL 100 MG PO TABS: 100.0000 mg | ORAL_TABLET | Freq: Every day | ORAL | Status: DC

## 2019-11-21 MED ORDER — ATORVASTATIN CALCIUM 80 MG PO TABS
80.0000 mg | ORAL_TABLET | Freq: Every day | ORAL | Status: DC
  Administered 2019-11-21 – 2019-11-30 (×9): 80 mg via ORAL
  Filled 2019-11-21: qty 1
  Filled 2019-11-21: qty 2
  Filled 2019-11-21 (×7): qty 1

## 2019-11-21 MED ORDER — SODIUM CHLORIDE 0.9% FLUSH
3.0000 mL | Freq: Two times a day (BID) | INTRAVENOUS | Status: DC
  Administered 2019-11-21: 3 mL via INTRAVENOUS

## 2019-11-21 MED ORDER — THIAMINE HCL 100 MG PO TABS
100.0000 mg | ORAL_TABLET | Freq: Every day | ORAL | Status: DC
  Administered 2019-11-21 – 2019-11-23 (×3): 100 mg via ORAL
  Filled 2019-11-21 (×3): qty 1

## 2019-11-21 MED ORDER — ADULT MULTIVITAMIN W/MINERALS CH
1.0000 | ORAL_TABLET | Freq: Every day | ORAL | Status: DC
  Administered 2019-11-21 – 2019-11-22 (×2): 1 via ORAL
  Filled 2019-11-21 (×3): qty 1

## 2019-11-21 MED ORDER — ABACAVIR-DOLUTEGRAVIR-LAMIVUD 600-50-300 MG PO TABS
1.0000 | ORAL_TABLET | Freq: Every day | ORAL | Status: DC
  Administered 2019-11-21 – 2019-11-30 (×9): 1 via ORAL
  Filled 2019-11-21 (×10): qty 1

## 2019-11-21 MED ORDER — LORAZEPAM 1 MG PO TABS: 0.0000 mg | ORAL_TABLET | Freq: Two times a day (BID) | ORAL | Status: DC

## 2019-11-21 MED ORDER — THIAMINE HCL 100 MG/ML IJ SOLN: 100.0000 mg | Freq: Every day | INTRAMUSCULAR | Status: DC

## 2019-11-21 MED ORDER — LORAZEPAM 2 MG/ML IJ SOLN
0.0000 mg | Freq: Three times a day (TID) | INTRAMUSCULAR | Status: AC
  Administered 2019-11-23: 4 mg via INTRAVENOUS
  Administered 2019-11-24: 2 mg via INTRAVENOUS
  Administered 2019-11-24: 4 mg via INTRAVENOUS
  Filled 2019-11-21 (×3): qty 2
  Filled 2019-11-21: qty 1

## 2019-11-21 MED ORDER — INSULIN ASPART 100 UNIT/ML ~~LOC~~ SOLN
0.0000 [IU] | SUBCUTANEOUS | Status: DC
  Administered 2019-11-21: 1 [IU] via SUBCUTANEOUS
  Administered 2019-11-21 (×2): 2 [IU] via SUBCUTANEOUS
  Administered 2019-11-22 (×2): 1 [IU] via SUBCUTANEOUS
  Administered 2019-11-22: 2 [IU] via SUBCUTANEOUS
  Administered 2019-11-22 – 2019-11-26 (×2): 1 [IU] via SUBCUTANEOUS
  Administered 2019-11-27 (×2): 2 [IU] via SUBCUTANEOUS
  Administered 2019-11-28 – 2019-11-29 (×3): 1 [IU] via SUBCUTANEOUS
  Filled 2019-11-21: qty 0.09

## 2019-11-21 MED ORDER — NITROGLYCERIN IN D5W 200-5 MCG/ML-% IV SOLN
0.0000 ug/min | INTRAVENOUS | Status: DC
  Administered 2019-11-21: 5 ug/min via INTRAVENOUS
  Filled 2019-11-21: qty 250

## 2019-11-21 MED ORDER — LORAZEPAM 2 MG/ML IJ SOLN
0.0000 mg | Freq: Four times a day (QID) | INTRAMUSCULAR | Status: DC
  Administered 2019-11-21: 2 mg via INTRAVENOUS
  Filled 2019-11-21: qty 1

## 2019-11-21 NOTE — ED Notes (Signed)
Date and time results received: 11/21/19 3:41 AM  (use smartphrase ".now" to insert current time)  Test: Troponin Critical Value: 1051  Name of Provider Notified: Dr.Pollina  Orders Received? Or Actions Taken?:

## 2019-11-21 NOTE — H&P (Addendum)
History and Physical    Sharon Norris GGY:[id number] DOB: [birth year] DOA: 11/21/2019  PCP: [name], MD   Patient coming from: Home   Chief Complaint: Chest pain   HPI: [name] is a 64 y.o. female with medical history significant for HIV, alcohol abuse, hypertension, remote history of IV drug abuse, and chronic kidney disease stage IIIb-IV, presenting to the emergency department for evaluation of chest pain.  Patient reports that she developed a burning sensation involving her left side around 6 PM yesterday.  The discomfort intensified and became more localized to the left chest.  Burning chest pain has now been constant and without any alleviating or exacerbating factors.  Patient also reports shortness of breath and productive cough.  She reports occasional leg swelling but is unsure if they are swollen now.  She denies abdominal pain.  ED Course: Upon arrival to the ED, patient is found to be afebrile, saturating mid 90s on room air, mildly tachypneic, tachycardic to 120s, and hypertensive to 180/100.  EKG features sinus tachycardia with rate 120, LVH with repolarization abnormality, and QTc interval of 561 ms.  Chest x-ray is notable for atelectasis versus infection at the left base.  Noncontrast head CT is negative for acute intracranial abnormality.  Chemistry panel is notable for glucose of 224 and creatinine 2.28.  CBC features a leukocytosis to 15,500 and a mild macrocytic anemia.  High-sensitivity troponin is elevated 1051.  COVID-19 PCR is negative.  Patient was given 324 mg of aspirin, started on IV heparin and IV nitroglycerin infusions, and cardiology was consulted by the ED physician.  Cardiology agreed with current management and recommended admission to the hospitalist service.  Review of Systems:  All other systems reviewed and apart from HPI, are negative.  Past Medical History:  Diagnosis Date  . Anxiety   . Bronchitis   . Depression   . Excessive cerumen in  both ear canals 07/02/2017  . H/O drug abuse ([place])    IV drugs, drug free 12 years  . Hepatitis    Hep. C  . History of suicidal ideation    thought about overdose in the past  . HIV infection ([place])   . Hypertension   . Neuropathy   . Sleep difficulties   . Trichomonal infection    2009    Past Surgical History:  Procedure Laterality Date  . COLPOSCOPY  2010    Social History:   reports that she quit smoking about 19 years ago. She has never used smokeless tobacco. She reports current alcohol use. She reports that she does not use drugs.  Allergies  Allergen Reactions  . Benadryl [Diphenhydramine Hcl] Hypertension  . Ribavirin Rash  . Viekira Pak [Ombitasvir-Paritaprevir-Ritonavir-Dasabuvir] Rash    Hives like reaction    Family History  Problem Relation Age of Onset  . Alcohol abuse Father   . Cancer Maternal Aunt        endometrial  . Cancer Sister        endometrial  . Cancer Sister        breast     Prior to Admission medications   Medication Sig Start Date End Date Taking? Authorizing Provider  aluminum sulfate-calcium acetate (DOMEBORO) packet Apply 1 packet topically 3 (three) times daily. Patient not taking: Reported on 04/30/2019 01/06/19   Ok Edwards, PA-C  amLODipine (NORVASC) 5 MG tablet TAKE ONE TABLET BY MOUTH DAILY 07/12/19   Oda Kilts, MD  divalproex (DEPAKOTE ER) 500 MG 24 hr  tablet TAKE ONE TABLET BY MOUTH DAILY 07/14/19   Axel Filler, MD  gabapentin (NEURONTIN) 300 MG capsule TAKE TWO CAPSULES BY MOUTH TWICE A DAY 07/12/19   Oda Kilts, MD  lisinopril-hydrochlorothiazide (ZESTORETIC) 20-25 MG tablet TAKE ONE TABLET BY MOUTH DAILY 07/12/19   Oda Kilts, MD  TRIUMEQ 600-50-300 MG tablet TAKE ONE TABLET BY MOUTH DAILY 10/10/19   Campbell Riches, MD    Physical Exam: Vitals:   11/21/19 0415 11/21/19 0430 11/21/19 0445 11/21/19 0500  BP: (!) 180/115 (!) 167/95 (!) 179/89 (!) 162/103  Pulse: (!) 121 (!) 117 (!)  122 (!) 116  Resp: (!) 30 (!) 29 20 (!) 22  Temp:      TempSrc:      SpO2: 100% 100% 100% 99%    Constitutional: NAD, anxious   Eyes: PERTLA, lids and conjunctivae normal ENMT: Mucous membranes are moist. Posterior pharynx clear of any exudate or lesions.   Neck: normal, supple, no masses, no thyromegaly Respiratory: Tachypnea, no wheezing. No accessory muscle use.  Cardiovascular: Rate ~120 and regular. No extremity edema.   Abdomen: No distension, no tenderness, soft. Bowel sounds active.  Musculoskeletal: no clubbing / cyanosis. No joint deformity upper and lower extremities.   Skin: no significant rashes, lesions, ulcers. Warm, dry, well-perfused. Neurologic: CN 2-12 grossly intact. Sensation intact. Strength 5/5 in all 4 limbs.  Psychiatric: Alert and oriented to person, place, and situation. Anxious, cooperative.    Labs and Imaging on Admission: I have personally reviewed following labs and imaging studies  CBC: Recent Labs  Lab 11/21/19 0253  WBC 15.5*  NEUTROABS 13.0*  HGB 11.4*  HCT 34.8*  MCV 102.1*  PLT 952   Basic Metabolic Panel: Recent Labs  Lab 11/21/19 0253  NA 144  K 3.8  CL 109  CO2 18*  GLUCOSE 224*  BUN 39*  CREATININE 2.28*  CALCIUM 9.1   GFR: CrCl cannot be calculated (Unknown ideal weight.). Liver Function Tests: Recent Labs  Lab 11/21/19 0253  AST 37  ALT 23  ALKPHOS 68  BILITOT 0.7  PROT 8.7*  ALBUMIN 3.8   No results for input(s): LIPASE, AMYLASE in the last 168 hours. No results for input(s): AMMONIA in the last 168 hours. Coagulation Profile: Recent Labs  Lab 11/21/19 0403  INR 1.0   Cardiac Enzymes: No results for input(s): CKTOTAL, CKMB, CKMBINDEX, TROPONINI in the last 168 hours. BNP (last 3 results) No results for input(s): PROBNP in the last 8760 hours. HbA1C: No results for input(s): HGBA1C in the last 72 hours. CBG: No results for input(s): GLUCAP in the last 168 hours. Lipid Profile: No results for  input(s): CHOL, HDL, LDLCALC, TRIG, CHOLHDL, LDLDIRECT in the last 72 hours. Thyroid Function Tests: No results for input(s): TSH, T4TOTAL, FREET4, T3FREE, THYROIDAB in the last 72 hours. Anemia Panel: No results for input(s): VITAMINB12, FOLATE, FERRITIN, TIBC, IRON, RETICCTPCT in the last 72 hours. Urine analysis:    Component Value Date/Time   COLORURINE YELLOW 05/25/2013 1940   APPEARANCEUR CLEAR 05/25/2013 1940   LABSPEC 1.012 05/25/2013 1940   PHURINE 6.5 05/25/2013 1940   GLUCOSEU NEGATIVE 05/25/2013 1940   GLUCOSEU NEG mg/dL 12/12/2007 2026   HGBUR NEGATIVE 05/25/2013 1940   BILIRUBINUR NEGATIVE 05/25/2013 1940   KETONESUR NEGATIVE 05/25/2013 1940   PROTEINUR 30 (A) 05/25/2013 1940   UROBILINOGEN 1.0 05/25/2013 1940   NITRITE NEGATIVE 05/25/2013 1940   LEUKOCYTESUR SMALL (A) 05/25/2013 1940   Sepsis Labs: @LABRCNTIP (procalcitonin:4,lacticidven:4) ) Recent Results (  from the past 240 hour(s))  Respiratory Panel by RT PCR (Flu A&B, Covid) - Nasopharyngeal Swab     Status: None   Collection Time: 11/21/19  2:55 AM   Specimen: Nasopharyngeal Swab  Result Value Ref Range Status   SARS Coronavirus 2 by RT PCR NEGATIVE NEGATIVE Final    Comment: (NOTE) SARS-CoV-2 target nucleic acids are NOT DETECTED.  The SARS-CoV-2 RNA is generally detectable in upper respiratoy specimens during the acute phase of infection. The lowest concentration of SARS-CoV-2 viral copies this assay can detect is 131 copies/mL. A negative result does not preclude SARS-Cov-2 infection and should not be used as the sole basis for treatment or other patient management decisions. A negative result may occur with  improper specimen collection/handling, submission of specimen other than nasopharyngeal swab, presence of viral mutation(s) within the areas targeted by this assay, and inadequate number of viral copies (<131 copies/mL). A negative result must be combined with clinical observations, patient  history, and epidemiological information. The expected result is Negative.  Fact Sheet for Patients:  PinkCheek.be  Fact Sheet for Healthcare Providers:  GravelBags.it  This test is no t yet approved or cleared by the [place] FDA and  has been authorized for detection and/or diagnosis of SARS-CoV-2 by FDA under an Emergency Use Authorization (EUA). This EUA will remain  in effect (meaning this test can be used) for the duration of the COVID-19 declaration under Section 564(b)(1) of the Act, 21 U.S.C. section 360bbb-3(b)(1), unless the authorization is terminated or revoked sooner.     Influenza A by PCR NEGATIVE NEGATIVE Final   Influenza B by PCR NEGATIVE NEGATIVE Final    Comment: (NOTE) The Xpert Xpress SARS-CoV-2/FLU/RSV assay is intended as an aid in  the diagnosis of influenza from Nasopharyngeal swab specimens and  should not be used as a sole basis for treatment. Nasal washings and  aspirates are unacceptable for Xpert Xpress SARS-CoV-2/FLU/RSV  testing.  Fact Sheet for Patients: PinkCheek.be  Fact Sheet for Healthcare Providers: GravelBags.it  This test is not yet approved or cleared by the [place] FDA and  has been authorized for detection and/or diagnosis of SARS-CoV-2 by  FDA under an Emergency Use Authorization (EUA). This EUA will remain  in effect (meaning this test can be used) for the duration of the  Covid-19 declaration under Section 564(b)(1) of the Act, 21  U.S.C. section 360bbb-3(b)(1), unless the authorization is  terminated or revoked. Performed at [facility], [place] [address] [place], [place] [address]      Radiological Exams on Admission: CT HEAD WO CONTRAST  Result Date: 11/21/2019 CLINICAL DATA:  Left side body numbness. History of HIV and hypertension EXAM: CT HEAD WITHOUT CONTRAST TECHNIQUE:  Contiguous axial images were obtained from the base of the skull through the vertex without intravenous contrast. COMPARISON:  04/20/2017 FINDINGS: Brain: No evidence of acute infarction, hemorrhage, hydrocephalus, extra-axial collection or mass lesion/mass effect. Interval but chronic appearing wedge-shaped infarct at the left stratum and internal capsule. Vascular: No hyperdense vessel or unexpected calcification. Skull: Normal. Negative for fracture or focal lesion. Sinuses/Orbits: No acute finding. IMPRESSION: 1. No acute finding. 2. 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 Electronically Signed   By: Monte Fantasia M.D.   On: 11/21/2019 04:07   DG Chest Port 1 View  Result Date: 11/21/2019 CLINICAL DATA:  Chest pain EXAM: PORTABLE CHEST 1 VIEW COMPARISON:  April 20, 2017 FINDINGS: The heart size and mediastinal contours  are within normal limits. Streaky airspace opacities seen at the left lung base. The visualized skeletal structures are unremarkable. IMPRESSION: Streaky airspace opacity at the left lung base which could be due to atelectasis and/or infectious etiology. Electronically Signed   By: Prudencio Pair M.D.   On: 11/21/2019 03:21    EKG: Independently reviewed. Sinus tachycardia (rate 120), LVH with repolarization abnormality, QTc 561 ms.   Assessment/Plan   1. NSTEMI  - Presents with chest pain and is found to have HS troponin of 1051 and EKG with sinus tachycardia and repolarization abnormality  - Cardiology discussed case with ED physician, patient was treated with ASA 324 mg, and started on IV heparin and nitroglycerin infusions  - Continue IV heparin, continue ASA, trend troponin, repeat EKG, start metoprolol for BP-control, check V/Q scan    2. Alcohol withdrawal  - Patient with hx of alcohol abuse presents with chest pain and is found to be agitated, tremulous, and tachycardic  - Continue to monitor with CIWA, use Ativan as needed, supplement  vitamins    3. Pneumonia  - Patient reports productive cough and SOB, is afebrile and saturating well on rm air, but has leukocytosis to 15.7k and question of PNA involving left base on CXR  - She meets sepsis criteria on admission with elevated HR, RR, and WBC but the leukocytosis appears to be chronic and tachycardia suspected secondary to alcohol withdrawal rather than actual sepsis  - Check procalcitonin, check strep pneumo and legionella antigens, and start Rocephin and doxycycline    4. CKD IV  - SCr is 2.28 on admission, similar to priors  - Renally-dose medications, monitor     5. HIV  - VL was 27 in February 2021 and CD4 was 448 in 2019  - Continue Triumeq   6. Hypertension  - BP is elevated to 180/100 in ED  - Pharmacy medication-reconciliation is pending  - She is being started on nitroglycerin infusion  - Start oral metoprolol   7. Left-sided numbness  - Patient complained on left-sided numbness in triage, states that it was more of a burning sensation that has since resolved, and she had no acute findings on head CT  - No focal neuro deficits noted on exam    8. Hyperglycemia   - Serum glucose is 224 in ED without hx of DM  - Check A1c, monitor CBGs, use low-intensity SSI if needed    9. Prolonged QT interval  - Continue cardiac monitoring, minimize QT-prolonging medications, repeat EKG in am    DVT prophylaxis: IV heparin  Code Status: Full  Family Communication: Discussed with patient  Disposition Plan:  Patient is from: Home  Anticipated d/c is to: TBD  Anticipated d/c date is: 11/24/19 Patient currently: Having NSTEMI, suspected acute alcohol withdrawal  Consults called: ED physician discussed with cardiology  Admission status: Inpatient     Vianne Bulls, MD [facility]  11/21/2019, 5:38 AM

## 2019-11-21 NOTE — Progress Notes (Signed)
ANTICOAGULATION CONSULT NOTE - Initial Consult  Pharmacy Consult for heparin Indication: chest pain/ACS  Allergies  Allergen Reactions  . Benadryl [Diphenhydramine Hcl] Hypertension  . Ribavirin Rash  . Viekira Pak [Ombitasvir-Paritaprevir-Ritonavir-Dasabuvir] Rash    Hives like reaction    Patient Measurements:   Heparin Dosing Weight: 89.4kg  Vital Signs: Temp: 98 F (36.7 C) (10/05 0251) Temp Source: Oral (10/05 0251) BP: 99/50 (10/05 0251) Pulse Rate: 129 (10/05 0251)  Labs: Recent Labs    11/21/19 0253  HGB 11.4*  HCT 34.8*  PLT 238  CREATININE 2.28*  TROPONINIHS 1,051*    CrCl cannot be calculated (Unknown ideal weight.).   Medical History: Past Medical History:  Diagnosis Date  . Anxiety   . Bronchitis   . Depression   . Excessive cerumen in both ear canals 07/02/2017  . H/O drug abuse ([place])    IV drugs, drug free 12 years  . Hepatitis    Hep. C  . History of suicidal ideation    thought about overdose in the past  . HIV infection ([place])   . Hypertension   . Neuropathy   . Sleep difficulties   . Trichomonal infection    2009     Assessment: 64 yo female presents to ED with numbness on the whole left side and burning pain in the left chest.  Pharmacy consulted to dose heparin for ACS/STEMI.  No prior AC noted  11/21/2019 Hgb 11.4 Plts WNL Scr 2.28 Pt/INR and aPTT ordered  Goal of Therapy:  Heparin level 0.3-0.7 units/ml Monitor platelets by anticoagulation protocol   Plan:  Heparin bolus 4000 units x 1 Start heparin drip at 1000 units/hr Heparin level in 6 hours Daily CBC   Dolly Rias RPh 11/21/2019, 4:05 AM

## 2019-11-21 NOTE — ED Notes (Signed)
Date and time results received: 11/21/19 1130   Test: Troponin Critical Value: [id number]  Name of Provider Notified: Rizwan

## 2019-11-21 NOTE — Consult Note (Signed)
Cardiology Consultation:   Patient ID: Sharon Norris MRN: [id number]; DOB: 24-Feb-1955  Admit date: 11/21/2019 Date of Consult: 11/21/2019  Primary Care Provider: [name], MD [facility] Cardiologist: No primary care provider on file. New [facility] Electrophysiologist:  None    Patient Profile:   Sharon Norris is a 64 y.o. female with a hx of HTN, CKD 3b-4, HIV (treated), alcohol abuse and remote IVDA who is being seen today for the evaluation of chest pain and markedly elevated cardiac enzymes at the request of Dr. Wynelle Cleveland.  History of Present Illness:   Ms. Trembley experienced abrupt onset burning left chest discomfort around 6PM yesterday, rapidly intensifying and spreading to her entire chest and back, but also to her face and associated paresthesias over the entire left side of her body. She is still hurting, but has improved since arrival to the ED. The pain worsens with breathing, but she is also exquisitely tender to touch over her costochondral joints and anterior left chest.  She was severely hypertensive and tachycardic (sinus) on arrival. Heart rate has normalized, she remains hypertensive on IV NTG, but improving. She denies missing her usual meds. She is not dyspneic at this time. She denies syncope or palpitations, cough or hemoptysis. She has cramping in both legs.  Her WBC is elevated, but she is afebrile. Her ECG shows sinus tachycardia, LVH and she has new repolarization changes (diffuse ST depression and T wave inversion, long QTc). There is no ST elevation. Both D-dimer and hs Trop I are elevated (1000 > 1500 > 3440). Her creatinine is markedly abnormal at 2.0, improved from March 2.28, but above apparent baseline  1.6-1.7 (GFR 35-40). AP CXR does not show CHF, streaky nonspecific left base opacity.  HIV is well compensated (barely detectable viral load, CD4 480), HCV viral load undetectable, COVID-19 negative. UDS pending, ETOH undetectable.   Past Medical  History:  Diagnosis Date  . Anxiety   . Bronchitis   . Depression   . Excessive cerumen in both ear canals 07/02/2017  . H/O drug abuse ([place])    IV drugs, drug free 12 years  . Hepatitis    Hep. C  . History of suicidal ideation    thought about overdose in the past  . HIV infection ([place])   . Hypertension   . Neuropathy   . Sleep difficulties   . Trichomonal infection    2009    Past Surgical History:  Procedure Laterality Date  . COLPOSCOPY  2010     Home Medications:  Prior to Admission medications   Medication Sig Start Date End Date Taking? Authorizing Provider  amLODipine (NORVASC) 5 MG tablet TAKE ONE TABLET BY MOUTH DAILY Patient taking differently: Take 5 mg by mouth daily.  07/12/19  Yes Oda Kilts, MD  divalproex (DEPAKOTE ER) 500 MG 24 hr tablet TAKE ONE TABLET BY MOUTH DAILY Patient taking differently: Take 500 mg by mouth daily.  07/14/19  Yes Axel Filler, MD  gabapentin (NEURONTIN) 300 MG capsule TAKE TWO CAPSULES BY MOUTH TWICE A DAY Patient taking differently: Take 300 mg by mouth 2 (two) times daily. TAKE TWO CAPSULES BY MOUTH TWICE A DAY 07/12/19  Yes Oda Kilts, MD  lisinopril-hydrochlorothiazide (ZESTORETIC) 20-25 MG tablet TAKE ONE TABLET BY MOUTH DAILY Patient taking differently: Take 1 tablet by mouth daily.  07/12/19  Yes Oda Kilts, MD  Multiple Vitamin (MULTIVITAMIN WITH MINERALS) TABS tablet Take 1 tablet by mouth daily.   Yes [provider]  TRIUMEQ [id number] MG tablet TAKE ONE TABLET BY MOUTH DAILY Patient taking differently: Take 1 tablet by mouth daily.  10/10/19  Yes Campbell Riches, MD  aluminum sulfate-calcium acetate [facility]) packet Apply 1 packet topically 3 (three) times daily. Patient not taking: Reported on 04/30/2019 01/06/19   Ok Edwards, PA-C    Inpatient Medications: Scheduled Meds: . abacavir-dolutegravir-lamiVUDine  1 tablet Oral Daily  . [START ON 11/22/2019] aspirin EC  81 mg Oral  Daily  . folic acid  1 mg Oral Daily  . insulin aspart  0-9 Units Subcutaneous Q4H  . LORazepam  0-4 mg Intravenous Q4H   Followed by  . [START ON 11/23/2019] LORazepam  0-4 mg Intravenous Q8H  . metoprolol tartrate  25 mg Oral BID  . multivitamin with minerals  1 tablet Oral Daily  . sodium chloride flush  3 mL Intravenous Q12H  . sodium chloride flush  3 mL Intravenous Q12H  . thiamine  100 mg Oral Daily   Or  . thiamine  100 mg Intravenous Daily   Continuous Infusions: . sodium chloride    . sodium chloride    . cefTRIAXone (ROCEPHIN)  IV Stopped (11/21/19 1779)  . doxycycline (VIBRAMYCIN) IV Stopped (11/21/19 0820)  . heparin 1,000 Units/hr (11/21/19 0425)  . nitroGLYCERIN 40 mcg/min (11/21/19 0912)   PRN Meds: sodium chloride, sodium chloride, acetaminophen, LORazepam **OR** LORazepam, sodium chloride flush, sodium chloride flush  Allergies:    Allergies  Allergen Reactions  . Benadryl [Diphenhydramine Hcl] Hypertension  . Ribavirin Rash  . Viekira Pak [Ombitasvir-Paritaprevir-Ritonavir-Dasabuvir] Rash    Hives like reaction    Social History:   Social History   Socioeconomic History  . Marital status: Single    Spouse name: Not on file  . Number of children: Not on file  . Years of education: Not on file  . Highest education level: Not on file  Occupational History  . Not on file  Tobacco Use  . Smoking status: Former Smoker    Quit date: 02/17/2000    Years since quitting: 19.7  . Smokeless tobacco: Never Used  Vaping Use  . Vaping Use: Never used  Substance and Sexual Activity  . Alcohol use: Yes    Alcohol/week: 0.0 standard drinks    Comment: beer, occasional  . Drug use: No  . Sexual activity: Not on file  Other Topics Concern  . Not on file  Social History Narrative  . Not on file   Social Determinants of Health   Financial Resource Strain:   . Difficulty of Paying Living Expenses: Not on file  Food Insecurity:   . Worried About Ship broker in the Last Year: Not on file  . Ran Out of Food in the Last Year: Not on file  Transportation Needs:   . Lack of Transportation (Medical): Not on file  . Lack of Transportation (Non-Medical): Not on file  Physical Activity:   . Days of Exercise per Week: Not on file  . Minutes of Exercise per Session: Not on file  Stress:   . Feeling of Stress : Not on file  Social Connections:   . Frequency of Communication with Friends and Family: Not on file  . Frequency of Social Gatherings with Friends and Family: Not on file  . Attends Religious Services: Not on file  . Active Member of Clubs or Organizations: Not on file  . Attends Archivist Meetings: Not on file  . Marital  Status: Not on file  Intimate Partner Violence:   . Fear of Current or Ex-Partner: Not on file  . Emotionally Abused: Not on file  . Physically Abused: Not on file  . Sexually Abused: Not on file    Family History:    Family History  Problem Relation Age of Onset  . Alcohol abuse Father   . Cancer Maternal Aunt        endometrial  . Cancer Sister        endometrial  . Cancer Sister        breast     ROS:  Please see the history of present illness.   All other ROS reviewed and negative.     Physical Exam/Data:   Vitals:   11/21/19 0830 11/21/19 0845 11/21/19 0848 11/21/19 0945  BP: (!) 157/92 128/74 (!) 157/92 (!) 159/96  Pulse: 81 77 82 87  Resp: (!) 23 (!) 21  (!) 26  Temp:      TempSrc:      SpO2: 99% 97%  98%    Intake/Output Summary (Last 24 hours) at 11/21/2019 0953 Last data filed at 11/21/2019 0820 Gross per 24 hour  Intake 850.89 ml  Output --  Net 850.89 ml   Last 3 Weights 04/10/2019 04/10/2019 08/24/2018  Weight (lbs) 197 lb 196 lb 197 lb 3.2 oz  Weight (kg) 89.359 kg 88.905 kg 89.449 kg     There is no height or weight on file to calculate BMI.  General:  Well nourished, well developed, in no acute distress, obese HEENT: normal Lymph: no adenopathy Neck: no  JVD Endocrine:  No thryomegaly Vascular: No carotid bruits; FA pulses 2+ bilaterally without bruits  Cardiac:  normal S1, S2; RRR; no murmur  Lungs:  Very tender to touch over anterior left chest and bilateral costochondral joints, clear to auscultation bilaterally, no wheezing, rhonchi or rales  Abd: soft, nontender, no hepatomegaly  Ext: no edema Musculoskeletal:  No deformities, BUE and BLE strength normal and equal Skin: warm and dry  Neuro:  CNs 2-12 intact, no focal abnormalities noted Psych:  Normal affect   EKG:  The EKG was personally reviewed and demonstrates:  STachy, LVH, repol changes could be due to LVH except they are new from 07/2018 tracing. QTc 521 ms Telemetry:  Telemetry was personally reviewed and demonstrates:  Sinus tachycardia, gradual improvement to NSR  Relevant CV Studies: ECHO 2015 ------------------------------------------------------------  Study Conclusions   Left ventricle: The cavity size was normal. Systolic  function was vigorous. The estimated ejection fraction was  in the range of 65% to 70%. Wall motion was normal; there  were no regional wall motion abnormalities.  Transthoracic echocardiography. M-mode, complete 2D,  spectral Doppler, and color Doppler. Height: Height:  160cm. Height: 63in. Weight: Weight: 75.8kg. Weight:  166.7lb. Body mass index: BMI: 29.6kg/m^2. Body surface  area:  BSA: 1.40m^2. Blood pressure:   99/46. Patient  status: Inpatient. Location: ICU/CCU   ------------------------------------------------------------   ------------------------------------------------------------  Left ventricle: The cavity size was normal. Systolic  function was vigorous. The estimated ejection fraction was  in the range of 65% to 70%. Wall motion was normal; there  were no regional wall motion abnormalities.   ------------------------------------------------------------  Aortic valve:  Trileaflet; normal thickness  leaflets.  Mobility was not restricted. Doppler: Transvalvular  velocity was within the normal range. There was no stenosis.  No regurgitation.   ------------------------------------------------------------  Aorta: Aortic root: The aortic root was normal in size.   ------------------------------------------------------------  Mitral valve:  Structurally normal valve.  Mobility was  not restricted. Doppler: Transvalvular velocity was within  the normal range. There was no evidence for stenosis.  Trivial regurgitation.  Peak gradient: 63mm Hg (D).   ------------------------------------------------------------  Left atrium: The atrium was normal in size.   ------------------------------------------------------------  Right ventricle: The cavity size was normal. Wall thickness  was normal. Systolic function was normal.   ------------------------------------------------------------  Pulmonic valve:  The valve appears to be grossly normal.  Doppler: Transvalvular velocity was within the normal  range. There was no evidence for stenosis.   ------------------------------------------------------------  Tricuspid valve:  Structurally normal valve.  Doppler:  Transvalvular velocity was within the normal range. Trivial  regurgitation.   ------------------------------------------------------------  Pulmonary artery:  The main pulmonary artery was  normal-sized. Systolic pressure was within the normal range.    ------------------------------------------------------------  Right atrium: The atrium was normal in size.   ------------------------------------------------------------  Pericardium: There was no pericardial effusion.   ------------------------------------------------------------  Systemic veins:  Inferior vena cava: The vessel was normal in size.    Laboratory Data:  High Sensitivity Troponin:   Recent Labs  Lab 11/21/19 0253 11/21/19 0453  11/21/19 0736  TROPONINIHS 1,051* 1,493* 3,440*     Chemistry Recent Labs  Lab 11/21/19 0253 11/21/19 0604  NA 144 139  K 3.8 3.7  CL 109 109  CO2 18* 18*  GLUCOSE 224* 180*  BUN 39* 37*  CREATININE 2.28* 2.04*  CALCIUM 9.1 8.5*  GFRNONAA 22* 25*  GFRAA 25* 29*  ANIONGAP 17* 12    Recent Labs  Lab 11/21/19 0253 11/21/19 0604  PROT 8.7* 7.6  ALBUMIN 3.8 3.5  AST 37 36  ALT 23 22  ALKPHOS 68 63  BILITOT 0.7 0.5   Hematology Recent Labs  Lab 11/21/19 0253 11/21/19 0604  WBC 15.5* 14.1*  RBC 3.41* 3.14*  HGB 11.4* 10.3*  HCT 34.8* 32.8*  MCV 102.1* 104.5*  MCH 33.4 32.8  MCHC 32.8 31.4  RDW 13.2 13.3  PLT 238 223   BNPNo results for input(s): BNP, PROBNP in the last 168 hours.  DDimer  Recent Labs  Lab 11/21/19 0403  DDIMER 0.96*     Radiology/Studies:  CT HEAD WO CONTRAST  Result Date: 11/21/2019 CLINICAL DATA:  Left side body numbness. History of HIV and hypertension EXAM: CT HEAD WITHOUT CONTRAST TECHNIQUE: Contiguous axial images were obtained from the base of the skull through the vertex without intravenous contrast. COMPARISON:  04/20/2017 FINDINGS: Brain: No evidence of acute infarction, hemorrhage, hydrocephalus, extra-axial collection or mass lesion/mass effect. Interval but chronic appearing wedge-shaped infarct at the left stratum and internal capsule. Vascular: No hyperdense vessel or unexpected calcification. Skull: Normal. Negative for fracture or focal lesion. Sinuses/Orbits: No acute finding. IMPRESSION: 1. No acute finding. 2. 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 Electronically Signed   By: Monte Fantasia M.D.   On: 11/21/2019 04:07   DG Chest Port 1 View  Result Date: 11/21/2019 CLINICAL DATA:  Chest pain EXAM: PORTABLE CHEST 1 VIEW COMPARISON:  April 20, 2017 FINDINGS: The heart size and mediastinal contours are within normal limits. Streaky airspace opacities seen at the left lung base. The  visualized skeletal structures are unremarkable. IMPRESSION: Streaky airspace opacity at the left lung base which could be due to atelectasis and/or infectious etiology. Electronically Signed   By: Prudencio Pair M.D.   On: 11/21/2019 03:21       TIMI Risk Score for Unstable Angina or  Non-ST Elevation MI:   The patient's TIMI risk score is 3, which indicates a 13% risk of all cause mortality, new or recurrent myocardial infarction or need for urgent revascularization in the next 14 days.      Assessment and Plan:   1. NSTEMI: while the cardiac markers are clearly abnormal, they are in a range that could explained by demand ischemia (severe HTN and tachycardia on arrival) or by acute pulmonary embolism (V/Q scan ordered). Her symptoms are quite nonspecific. Her abnormal renal function makes her a suboptimal candidate for contrast based imaging. Will order a STAT Echo. If there are clear wall motion abnormalities, should proceed to cardiac cath, after optimization of renal function. Meanwhile keep in IV heparin and IV NTG. Echo will also give [place] a limited evaluation of the ascending aorta and pericardium. CXR does not raise concern for aortic dissection, but this remains in the differential. 2. HTN: on IV NTG. OK to give beta blockers (use combined alpha-beta blocker carvedilol; UDS still pending) and calcium channel blockers. Will hold diuretics and ARB due to Ac on chr renal abnormalities. 3. Ac on CKD3b: creat worse than baseline. Hold ARB and diuretics and monitor. Would prefer creat down to 1.7 range before any contrast based procedures. 4. Long QTc: mild hypocalcemia, but other electrolytes are OK and home meds do not typically affect the QT. Ischemia is a possible explanation. Check iCa. 5. HLP:  Elevated chol, LDL and TG in last couple of years (may be protease inhibitor related). Recommend statin. 6. HIV: on triumeq. Good CD4. 7. Abnormal CXR: not specific but possible pneumonia versus  pulmonary infarction. ABx started. V/Q pending. 8. Obesity/prediabetes: A1c 6.2%, not on meds.      For questions or updates, please contact [place] Please consult [email or web address] for contact info under    Signed, Sanda Klein, MD  11/21/2019 9:53 AM

## 2019-11-21 NOTE — ED Notes (Addendum)
Pt transferred out of this ED to [facility] via [facility], report to [facility] with no further questions at this time. Pt cont on nitro drip and heparin drip. Report previously called to [facility]

## 2019-11-21 NOTE — ED Provider Notes (Signed)
[facility] DEPT Provider Note   CSN: [id number] Arrival date & time: 11/21/19  1950     History Chief Complaint  Patient presents with  . Numbness  . Chest Pain    Nautica Hotz is a 64 y.o. female.  Patient presents to the emergency department for evaluation of multiple problems.  Reports that she started to have numbness on the "whole left side "around 6 PM.  That has transition to "pins-and-needles.  She is now complaining of a burning pain in the left chest.  Patient comes to the ER by ambulance.  EMS report patient seems to be extremely anxious.        Past Medical History:  Diagnosis Date  . Anxiety   . Bronchitis   . Depression   . Excessive cerumen in both ear canals 07/02/2017  . H/O drug abuse ([place])    IV drugs, drug free 12 years  . Hepatitis    Hep. C  . History of suicidal ideation    thought about overdose in the past  . HIV infection ([place])   . Hypertension   . Neuropathy   . Sleep difficulties   . Trichomonal infection    2009    Patient Active Problem List   Diagnosis Date Noted  . NSTEMI (non-ST elevated myocardial infarction) ([place]) 11/21/2019  . SIRS (systemic inflammatory response syndrome) ([place]) 11/21/2019  . Numbness on left side 11/21/2019  . Prolonged QT interval 11/21/2019  . Alcohol withdrawal ([place]) 11/21/2019  . Atypical chest pain/L. Shoulder pain 04/10/2019  . CKD (chronic kidney disease), stage IV ([place]) 08/25/2018  . Constipated 06/28/2018  . Fall 07/13/2017  . Poor dentition 04/08/2016  . Health care maintenance 09/05/2013  . Psychiatric disorder 09/17/2010  . Peripheral neuropathy ([place]) 04/02/2009  . Insomnia 01/15/2009  . Chronic back pain 12/12/2007  . Human immunodeficiency virus (HIV) disease ([place]) 12/23/2005  . History of Chronic hepatitis C without hepatic coma ([place]) 12/23/2005  . Essential hypertension 12/23/2005    Past Surgical History:  Procedure Laterality Date  . COLPOSCOPY   2010     OB History    Gravida  3   Para  2   Term  2   Preterm      AB  1   Living        SAB      TAB  1   Ectopic      Multiple      Live Births              Family History  Problem Relation Age of Onset  . Alcohol abuse Father   . Cancer Maternal Aunt        endometrial  . Cancer Sister        endometrial  . Cancer Sister        breast    Social History   Tobacco Use  . Smoking status: Former Smoker    Quit date: 02/17/2000    Years since quitting: 19.7  . Smokeless tobacco: Never Used  Vaping Use  . Vaping Use: Never used  Substance Use Topics  . Alcohol use: Yes    Alcohol/week: 0.0 standard drinks    Comment: beer, occasional  . Drug use: No    Home Medications Prior to Admission medications   Medication Sig Start Date End Date Taking? Authorizing Provider  aluminum sulfate-calcium acetate (DOMEBORO) packet Apply 1 packet topically 3 (three) times daily. Patient not taking: Reported on 04/30/2019  01/06/19   Tasia Catchings, Amy V, PA-C  amLODipine (NORVASC) 5 MG tablet TAKE ONE TABLET BY MOUTH DAILY 07/12/19   Oda Kilts, MD  divalproex (DEPAKOTE ER) 500 MG 24 hr tablet TAKE ONE TABLET BY MOUTH DAILY 07/14/19   Axel Filler, MD  gabapentin (NEURONTIN) 300 MG capsule TAKE TWO CAPSULES BY MOUTH TWICE A DAY 07/12/19   Oda Kilts, MD  lisinopril-hydrochlorothiazide (ZESTORETIC) 20-25 MG tablet TAKE ONE TABLET BY MOUTH DAILY 07/12/19   Oda Kilts, MD  TRIUMEQ 600-50-300 MG tablet TAKE ONE TABLET BY MOUTH DAILY 10/10/19   Campbell Riches, MD    Allergies    Benadryl [diphenhydramine hcl], Ribavirin, and Viekira pak [ombitasvir-paritaprevir-ritonavir-dasabuvir]  Review of Systems   Review of Systems  Cardiovascular: Positive for chest pain.  Neurological: Positive for numbness. Negative for weakness.  Psychiatric/Behavioral: The patient is nervous/anxious.   All other systems reviewed and are negative.   Physical  Exam Updated Vital Signs BP (!) 162/103   Pulse (!) 116   Temp 98 F (36.7 C) (Oral)   Resp (!) 22   SpO2 99%   Physical Exam Vitals and nursing note reviewed.  Constitutional:      General: She is not in acute distress.    Appearance: Normal appearance. She is well-developed.  HENT:     Head: Normocephalic and atraumatic.     Right Ear: Hearing normal.     Left Ear: Hearing normal.     Nose: Nose normal.  Eyes:     Conjunctiva/sclera: Conjunctivae normal.     Pupils: Pupils are equal, round, and reactive to light.  Cardiovascular:     Rate and Rhythm: Regular rhythm. Tachycardia present.     Heart sounds: S1 normal and S2 normal. No murmur heard.  No friction rub. No gallop.   Pulmonary:     Effort: Pulmonary effort is normal. No respiratory distress.     Breath sounds: Normal breath sounds.  Chest:     Chest wall: No tenderness.  Abdominal:     General: Bowel sounds are normal.     Palpations: Abdomen is soft.     Tenderness: There is no abdominal tenderness. There is no guarding or rebound. Negative signs include Murphy's sign and McBurney's sign.     Hernia: No hernia is present.  Musculoskeletal:        General: Normal range of motion.     Cervical back: Normal range of motion and neck supple.  Skin:    General: Skin is warm and dry.     Findings: No rash.  Neurological:     Mental Status: She is alert and oriented to person, place, and time.     GCS: GCS eye subscore is 4. GCS verbal subscore is 5. GCS motor subscore is 6.     Cranial Nerves: No cranial nerve deficit.     Sensory: No sensory deficit.     Coordination: Coordination normal.  Psychiatric:        Mood and Affect: Mood is anxious. Affect is tearful.        Speech: Speech normal.        Behavior: Behavior normal.        Thought Content: Thought content normal.     ED Results / Procedures / Treatments   Labs (all labs ordered are listed, but only abnormal results are displayed) Labs Reviewed    CBC WITH DIFFERENTIAL/PLATELET - Abnormal; Notable for the following components:  Result Value   WBC 15.5 (*)    RBC 3.41 (*)    Hemoglobin 11.4 (*)    HCT 34.8 (*)    MCV 102.1 (*)    Neutro Abs 13.0 (*)    Monocytes Absolute 1.1 (*)    All other components within normal limits  COMPREHENSIVE METABOLIC PANEL - Abnormal; Notable for the following components:   CO2 18 (*)    Glucose, Bld 224 (*)    BUN 39 (*)    Creatinine, Ser 2.28 (*)    Total Protein 8.7 (*)    GFR calc non Af Amer 22 (*)    GFR calc Af Amer 25 (*)    Anion gap 17 (*)    All other components within normal limits  D-DIMER, QUANTITATIVE (NOT AT [facility]) - Abnormal; Notable for the following components:   D-Dimer, Quant 0.96 (*)    All other components within normal limits  TROPONIN I (HIGH SENSITIVITY) - Abnormal; Notable for the following components:   Troponin I (High Sensitivity) 1,051 (*)    All other components within normal limits  RESPIRATORY PANEL BY RT PCR (FLU A&B, COVID)  PROTIME-INR  APTT  URINALYSIS, ROUTINE W REFLEX MICROSCOPIC  ETHANOL  RAPID URINE DRUG SCREEN, HOSP PERFORMED  HEPARIN LEVEL (UNFRACTIONATED)  TROPONIN I (HIGH SENSITIVITY)    EKG EKG Interpretation  Date/Time:  Tuesday November 21 2019 02:58:17 EDT Ventricular Rate:  120 PR Interval:    QRS Duration: 89 QT Interval:  397 QTC Calculation: 561 R Axis:   33 Text Interpretation: Sinus tachycardia LAE, consider biatrial enlargement Probable LVH with secondary repol abnrm Prolonged QT interval Confirmed by Orpah Greek [phone]) on 11/21/2019 3:05:56 AM   Radiology CT HEAD WO CONTRAST  Result Date: 11/21/2019 CLINICAL DATA:  Left side body numbness. History of HIV and hypertension EXAM: CT HEAD WITHOUT CONTRAST TECHNIQUE: Contiguous axial images were obtained from the base of the skull through the vertex without intravenous contrast. COMPARISON:  04/20/2017 FINDINGS: Brain: No evidence of acute infarction,  hemorrhage, hydrocephalus, extra-axial collection or mass lesion/mass effect. Interval but chronic appearing wedge-shaped infarct at the left stratum and internal capsule. Vascular: No hyperdense vessel or unexpected calcification. Skull: Normal. Negative for fracture or focal lesion. Sinuses/Orbits: No acute finding. IMPRESSION: 1. No acute finding. 2. 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 Electronically Signed   By: Monte Fantasia M.D.   On: 11/21/2019 04:07   DG Chest Port 1 View  Result Date: 11/21/2019 CLINICAL DATA:  Chest pain EXAM: PORTABLE CHEST 1 VIEW COMPARISON:  April 20, 2017 FINDINGS: The heart size and mediastinal contours are within normal limits. Streaky airspace opacities seen at the left lung base. The visualized skeletal structures are unremarkable. IMPRESSION: Streaky airspace opacity at the left lung base which could be due to atelectasis and/or infectious etiology. Electronically Signed   By: Prudencio Pair M.D.   On: 11/21/2019 03:21    Procedures Procedures (including critical care time)  Medications Ordered in ED Medications  LORazepam (ATIVAN) injection 0-4 mg (2 mg Intravenous Given 11/21/19 0314)    Or  LORazepam (ATIVAN) tablet 0-4 mg ( Oral See Alternative 11/21/19 0314)  LORazepam (ATIVAN) injection 0-4 mg (has no administration in time range)    Or  LORazepam (ATIVAN) tablet 0-4 mg (has no administration in time range)  thiamine tablet 100 mg (has no administration in time range)    Or  thiamine (B-1) injection 100 mg (has no administration in  time range)  heparin ADULT infusion 100 units/mL (25000 units/260mL sodium chloride 0.45%) (1,000 Units/hr Intravenous New Bag/Given 11/21/19 0425)  nitroGLYCERIN 50 mg in dextrose 5 % 250 mL (0.2 mg/mL) infusion (has no administration in time range)  sodium chloride 0.9 % bolus 500 mL (0 mLs Intravenous Stopped 11/21/19 0351)  aspirin chewable tablet 324 mg (324 mg Oral Given 11/21/19  0351)  heparin bolus via infusion 4,000 Units (4,000 Units Intravenous Bolus from Bag 11/21/19 0424)    ED Course  I have reviewed the triage vital signs and the nursing notes.  Pertinent labs & imaging results that were available during my care of the patient were reviewed by me and considered in my medical decision making (see chart for details).    MDM Rules/Calculators/A&P                          Patient presents to the emergency department for evaluation of multiple problems.  Patient initially was experiencing pins-and-needles and numbness on the left side of her body but then developed chest pain.  At arrival to the ER patient complaining of moderate to severe burning left-sided chest pain.  She was very anxious at arrival.  No hypoxia noted but she is very tachycardic.  This is likely multifactorial.  She does have a history of multiple visits for alcohol intoxication, cannot rule out withdrawal.  She was started on CIWA protocol.  Patient does not have any objective neurologic findings on examination.  Head CT does not show acute abnormality.  EKG upon arrival shows LVH but no obvious ischemia and no ST elevation.  First troponin is elevated.  Patient not experiencing any significant shortness of breath or hypoxia but PE could be considered.  D-dimer added on.  Patient's creatinine is elevated, cannot receive IV dye for CT angiography.  Can perform VQ scan if felt necessary in the morning.  Patient empirically started on heparin secondary to elevated troponin.  Will add nitroglycerin.  Discussed with Dr. Paticia Stack, on-call for cardiology.  Agrees with current treatment plan.  Patient to be admitted to hospitalist service, preferably at [facility]  CRITICAL CARE Performed by: Orpah Greek   Total critical care time: 35 minutes  Critical care time was exclusive of separately billable procedures and treating other patients.  Critical care was necessary to treat or prevent  imminent or life-threatening deterioration.  Critical care was time spent personally by me on the following activities: development of treatment plan with patient and/or surrogate as well as nursing, discussions with consultants, evaluation of patient's response to treatment, examination of patient, obtaining history from patient or surrogate, ordering and performing treatments and interventions, ordering and review of laboratory studies, ordering and review of radiographic studies, pulse oximetry and re-evaluation of patient's condition.  Final Clinical Impression(s) / ED Diagnoses Final diagnoses:  NSTEMI (non-ST elevated myocardial infarction) [facility])    Rx / DC Orders ED Discharge Orders    None       Ashanti Ratti, Gwenyth Allegra, MD 11/21/19 [phone]

## 2019-11-21 NOTE — Progress Notes (Signed)
[facility] for heparin Indication: chest pain/ACS  Allergies  Allergen Reactions  . Benadryl [Diphenhydramine Hcl] Hypertension  . Ribavirin Rash  . Viekira Pak [Ombitasvir-Paritaprevir-Ritonavir-Dasabuvir] Rash    Hives like reaction    Patient Measurements:   Heparin Dosing Weight: 89.4kg  Vital Signs: Temp: 98 F (36.7 C) (10/05 0251) Temp Source: Oral (10/05 0251) BP: 108/59 (10/05 1130) Pulse Rate: 88 (10/05 1130)  Labs: Recent Labs    11/21/19 0253 11/21/19 0253 11/21/19 0403 11/21/19 0453 11/21/19 0604 11/21/19 0736 11/21/19 1032  HGB 11.4*  --   --   --  10.3*  --   --   HCT 34.8*  --   --   --  32.8*  --   --   PLT 238  --   --   --  223  --   --   APTT  --   --  27  --   --   --   --   LABPROT  --   --  13.1  --   --   --   --   INR  --   --  1.0  --   --   --   --   HEPARINUNFRC  --   --   --   --   --   --  0.40  CREATININE 2.28*  --   --   --  2.04*  --   --   TROPONINIHS 1,051*   < >  --  1,493*  --  3,440* 4,942*   < > = values in this interval not displayed.    CrCl cannot be calculated (Unknown ideal weight.).   Assessment: 64 yo female presents to ED with numbness on the whole left side and burning pain in the left chest.  Pharmacy consulted to dose heparin for ACS/STEMI.  No prior AC noted  Today, 11/21/2019:  Hgb low, trending down slowly; Plt stable WNL  First heparin level therapeutic on 1000 units/hr  SCr elevated, trending down  Markedly elevated troponins, continues to rise  Planning for imaging to r/o PE  Goal of Therapy:  Heparin level 0.3-0.7 units/ml Monitor platelets by anticoagulation protocol   Plan:   Continue heparin drip at 1000 units/hr  Confirmatory heparin level in 6-8 hr  Daily CBC and heparin level  Monitor for s/s bleeding  F/u imaging  Reuel Boom, PharmD, BCPS [phone] 11/21/2019, 11:39 AM

## 2019-11-21 NOTE — ED Notes (Signed)
Pt resting in stretcher, wakes easily to verbal stimuli. Denies any chest pain at this time.

## 2019-11-21 NOTE — ED Notes (Signed)
Attempted to call pt daughter Jonelle Sidle with no answer.

## 2019-11-21 NOTE — ED Notes (Signed)
Echo at bedside

## 2019-11-21 NOTE — ED Notes (Signed)
Patients daughter, Zyniah Ferraiolo would like someone to call her and give her an update, [phone].

## 2019-11-21 NOTE — ED Notes (Signed)
Date and time results received: 11/21/19 6:12 AM  (use smartphrase ".now" to insert current time)  Test: Troponin Critical Value: 1493  Name of Provider Notified: Dr.Opyd  Orders Received? Or Actions Taken?:

## 2019-11-21 NOTE — ED Notes (Signed)
Date and time results received: 11/21/19 0846 Test: Troponin Critical Value: Troponin   Name of Provider Notified: Debbe Odea, MD  Orders Received? Or Actions Taken?:No actions needed, heparin currently infusing.

## 2019-11-21 NOTE — Progress Notes (Signed)
Bilateral lower extremity venous duplex has been completed. Preliminary results can be found in CV Proc through chart review.   11/21/19 3:16 PM Sharon Norris RVT

## 2019-11-21 NOTE — ED Triage Notes (Signed)
Patient arrived via [facility] stating tonight around 6pm the left side of her body became numb, then had a "pins and needles" sensation. Also has complaints of burning in her chest.

## 2019-11-21 NOTE — Progress Notes (Signed)
  Echocardiogram 2D Echocardiogram has been performed.  Sharon Norris 11/21/2019, 10:37 AM

## 2019-11-21 NOTE — ED Notes (Signed)
Pt endorsing some returning chest pain. 4/10

## 2019-11-21 NOTE — ED Notes (Signed)
Report given to Suezanne Jacquet, Therapist, sports at [facility] ([facility])

## 2019-11-22 ENCOUNTER — Inpatient Hospital Stay: Payer: Self-pay

## 2019-11-22 DIAGNOSIS — I214 Non-ST elevation (NSTEMI) myocardial infarction: Secondary | ICD-10-CM | POA: Diagnosis not present

## 2019-11-22 LAB — CBC
HCT: 32 % — ABNORMAL LOW (ref 36.0–46.0)
Hemoglobin: 10.1 g/dL — ABNORMAL LOW (ref 12.0–15.0)
MCH: 32.7 pg (ref 26.0–34.0)
MCHC: 31.6 g/dL (ref 30.0–36.0)
MCV: 103.6 fL — ABNORMAL HIGH (ref 80.0–100.0)
Platelets: 208 10*3/uL (ref 150–400)
RBC: 3.09 MIL/uL — ABNORMAL LOW (ref 3.87–5.11)
RDW: 13.3 % (ref 11.5–15.5)
WBC: 8.3 10*3/uL (ref 4.0–10.5)
nRBC: 0 % (ref 0.0–0.2)

## 2019-11-22 LAB — CALCIUM, IONIZED: Calcium, Ionized, Serum: 4.2 mg/dL — ABNORMAL LOW (ref 4.5–5.6)

## 2019-11-22 LAB — BASIC METABOLIC PANEL
Anion gap: 13 (ref 5–15)
BUN: 18 mg/dL (ref 8–23)
CO2: 19 mmol/L — ABNORMAL LOW (ref 22–32)
Calcium: 8.3 mg/dL — ABNORMAL LOW (ref 8.9–10.3)
Chloride: 107 mmol/L (ref 98–111)
Creatinine, Ser: 1.49 mg/dL — ABNORMAL HIGH (ref 0.44–1.00)
GFR calc non Af Amer: 36 mL/min — ABNORMAL LOW (ref >60)
Glucose, Bld: 137 mg/dL — ABNORMAL HIGH (ref 70–99)
Potassium: 3.7 mmol/L (ref 3.5–5.1)
Sodium: 139 mmol/L (ref 135–145)

## 2019-11-22 LAB — GLUCOSE, CAPILLARY
Glucose-Capillary: 113 mg/dL — ABNORMAL HIGH (ref 70–99)
Glucose-Capillary: 122 mg/dL — ABNORMAL HIGH (ref 70–99)
Glucose-Capillary: 127 mg/dL — ABNORMAL HIGH (ref 70–99)
Glucose-Capillary: 128 mg/dL — ABNORMAL HIGH (ref 70–99)
Glucose-Capillary: 138 mg/dL — ABNORMAL HIGH (ref 70–99)
Glucose-Capillary: 138 mg/dL — ABNORMAL HIGH (ref 70–99)

## 2019-11-22 LAB — HEPARIN LEVEL (UNFRACTIONATED): Heparin Unfractionated: <0.1 IU/mL — ABNORMAL LOW (ref 0.30–0.70)

## 2019-11-22 MED ORDER — ENOXAPARIN SODIUM 100 MG/ML ~~LOC~~ SOLN
90.0000 mg | Freq: Two times a day (BID) | SUBCUTANEOUS | Status: DC
  Administered 2019-11-22 – 2019-11-24 (×4): 90 mg via SUBCUTANEOUS
  Filled 2019-11-22 (×5): qty 0.9

## 2019-11-22 MED ORDER — CARVEDILOL 12.5 MG PO TABS
12.5000 mg | ORAL_TABLET | Freq: Two times a day (BID) | ORAL | Status: DC
  Administered 2019-11-22 – 2019-11-27 (×8): 12.5 mg via ORAL
  Filled 2019-11-22 (×8): qty 1

## 2019-11-22 MED ORDER — LORAZEPAM 2 MG/ML IJ SOLN
2.0000 mg | Freq: Two times a day (BID) | INTRAMUSCULAR | Status: DC
  Administered 2019-11-22: 2 mg via INTRAMUSCULAR

## 2019-11-22 MED ORDER — LORAZEPAM 2 MG/ML IJ SOLN
5.0000 mg | INTRAMUSCULAR | Status: DC
  Administered 2019-11-22: 5 mg via INTRAMUSCULAR
  Filled 2019-11-22: qty 3

## 2019-11-22 MED ORDER — CHLORDIAZEPOXIDE HCL 25 MG PO CAPS
50.0000 mg | ORAL_CAPSULE | Freq: Once | ORAL | Status: DC
  Filled 2019-11-22: qty 2

## 2019-11-22 MED ORDER — ISOSORBIDE MONONITRATE ER 60 MG PO TB24
120.0000 mg | ORAL_TABLET | Freq: Every day | ORAL | Status: DC
  Administered 2019-11-22 – 2019-11-30 (×8): 120 mg via ORAL
  Filled 2019-11-22: qty 4
  Filled 2019-11-22 (×2): qty 2
  Filled 2019-11-22 (×4): qty 4
  Filled 2019-11-22 (×2): qty 2
  Filled 2019-11-22: qty 4

## 2019-11-22 NOTE — Progress Notes (Signed)
[place] for heparin > switch to Lovenox Indication: chest pain/ACS  Allergies  Allergen Reactions  . Benadryl [Diphenhydramine Hcl] Hypertension  . Ribavirin Rash  . Viekira Pak [Ombitasvir-Paritaprevir-Ritonavir-Dasabuvir] Rash    Hives like reaction    Patient Measurements: Height: 5\' 3"  (160 cm) Weight: 90.3 kg (199 lb 1.2 oz) IBW/kg (Calculated) : 52.4 Heparin Dosing Weight: 89.4kg  Vital Signs: Temp: 98.6 F (37 C) (10/06 0747) Temp Source: Oral (10/06 0747) BP: 140/96 (10/06 0747) Pulse Rate: 105 (10/06 1142)  Labs: Recent Labs    11/21/19 0253 11/21/19 0403 11/21/19 0453 11/21/19 0604 11/21/19 0736 11/21/19 1032 11/21/19 1430 11/22/19 0828  HGB 11.4*  --    < > 10.3*  --   --   --  10.1*  HCT 34.8*  --   --  32.8*  --   --   --  32.0*  PLT 238  --   --  223  --   --   --  208  APTT  --  27  --   --   --   --   --   --   LABPROT  --  13.1  --   --   --   --   --   --   INR  --  1.0  --   --   --   --   --   --   HEPARINUNFRC  --   --   --   --   --  0.40  --  <0.10*  CREATININE 2.28*  --   --  2.04*  --   --   --  1.49*  TROPONINIHS 1,051*  --    < >  --  3,440* 4,942* 4,910*  --    < > = values in this interval not displayed.    Estimated Creatinine Clearance: 39.6 mL/min (A) (by C-G formula based on SCr of 1.49 mg/dL (H)).   Assessment: 64 yo female presents to ED with numbness on the whole left side and burning pain in the left chest.  Pharmacy consulted to dose heparin for ACS/STEMI.  No prior AC noted  Heparin level drawn this am < 0.1 - but patient had ripped out IV and heparin dip off at that time IV team called for replacement  - unable to replace at this time, and pharmacy asked to anticoagulate with Lovenox instead.  Baseline CBC WNL  Goal of Therapy:  Monitor platelets by anticoagulation protocol   Plan:  Start Lovenox 90 mg sq q 12 hrs. Will need to watch renal function carefully. F/u plans for  further ischemic workup.  [place] Crane, Roylene Reason, BCCP Clinical Pharmacist  11/22/2019 12:44 PM   [facility] pharmacy phone numbers are listed on [email or web address]

## 2019-11-22 NOTE — Progress Notes (Signed)
Called Sharon Norris [daughter] [phone]  Spoke with daughter who had last seen her in [place] and went to help her granddaughter who had a baby recently She came back on Oct 1st and went to see her friends subsequently Granddaughter  Further h/o is unclear Daughter doesn't live with the patient and does not have much idea of her habits although states that at baseline she is not agitated.  And usually is amenable to discussions and reasoning  She asks if her presence would help with situation and that would be reasonable  Verneita Griffes, MD [facility] 3:07 PM

## 2019-11-22 NOTE — Progress Notes (Signed)
PROGRESS NOTE    Sharon Norris  ELF:[id number] DOB: Mar 27, 1955 DOA: 11/21/2019 PCP: [name], MD followed by internal medicine teaching service  Brief Narrative:  64 WF known history of HIV followed by infectious disease Dr. Johnnye Sima Prior hep C Prior drug abuse Prior admission 05/25/2013-05/27/2013 noncardiac chest pain Seen for video visit 11/01/2019 and was doing fair at this time Admitted 11/21/2019 with left-sided numbness pins-and-needles burning in chest-EMS noted patient extremely anxious-EKG sinus tach rate 120 CXR atelectasis?  Infection WBC 50 high-sensitivity troponin 1051 cardiology consulted   Assessment & Plan:   Principal Problem:   NSTEMI (non-ST elevated myocardial infarction) ([place]) Active Problems:   Human immunodeficiency virus (HIV) disease ([place])   Essential hypertension   CKD (chronic kidney disease), stage IV (HCC)   SIRS (systemic inflammatory response syndrome) (HCC)   Numbness on left side   Prolonged QT interval   Alcohol withdrawal (HCC)   Hyperglycemia   1. NSTEMI a. No IV access so convert heparin to Lovenox b. Echo is normal-possibly noninvasive evaluation when more stable-await VQ scan if possible-rest as per cardiology 2. ?  Pneumonia on admission a. Continue ceftriaxone IV doxycycline IV b. Repeat labs in a.m. feels some component of volume overload so need x-ray in 1 to 2 days and would repeat procalcitonin as well c. Might narrow off abx tomorrow 3. Toxic metabolic encephalopathy 4. HTN a. Previously on IV nitroglycerin this has been discontinued in favor of oral Imdur oral beta-blocker however if unable to take may need IV b. She has no IV access PICC line will hopefully be obtained 5. Acute superimposed on chronic CKD 3 a. Peak 39/2.2 but has trended down over the past several days b. Try to avoid nephrotoxins 6. Prolonged QTC 7. HIV a. Continue triumeq 1 tablet daily-needs outpatient follow-up with infectious disease 8. Hep C?   Encephalopathy related to this a. With next labs obtain ammonia and will trial lactulose p.o. b. Outpatient further follow-up 9. A1c 6.1 with obesity 10. BMI 35  DVT prophylaxis:  Code Status:  Family Communication: Disposition:   Status is: Inpatient  Remains inpatient appropriate because:Hemodynamically unstable, Altered mental status, Ongoing diagnostic testing needed not appropriate for outpatient work up and IV treatments appropriate due to intensity of illness or inability to take PO   Dispo: The patient is from: Home              Anticipated d/c is to: SNF              Anticipated d/c date is: 2 days              Patient currently is not medically stable to d/c.   Consultants:   Cardiology  Procedures: Duplex lower extremities 10/5 Echo 10/5  Antimicrobials: Ceftriaxone doxycycline   Subjective: Awake agitated Nursing called me 2/2 patient being agitated and non-rerdrectable Patient is seeing things and hearing things that aren't there---she told the RN student that she saw her grandchildren.  Patient thinks that she is at home and is asking to leave   Objective: Vitals:   11/21/19 1715 11/21/19 1807 11/21/19 1945 11/22/19 0417  BP: (!) 180/94 (!) 130/91 128/66   Pulse: 91 100 97   Resp: (!) 21 19 16    Temp:  98.2 F (36.8 C) 98.6 F (37 C)   TempSrc:  Oral Oral   SpO2: 100% 100% 99%   Weight:    90.3 kg    Intake/Output Summary (Last 24 hours) at 11/22/2019 0700  Last data filed at 11/22/2019 0400 Gross per 24 hour  Intake 1093.12 ml  Output --  Net 1093.12 ml   Filed Weights   11/22/19 0417  Weight: 90.3 kg    Examination:  General exam: eomi ncat no focal deficit Respiratory system: clear no added sound no rale rhonchi Cardiovascular system: s1 s2no m/r/g Gastrointestinal system: soft nd nt no rebound. Central nervous system: cinfused Extremities: none Skin: soft nt nd no swelling Psychiatry:quite agitated and cursing in bed  Data  Reviewed: I have personally reviewed following labs and imaging studies BUN/Creat 37/2.0--->18/1.49 Trop  1,493--->4,910  Radiology Studies: CT HEAD WO CONTRAST  Result Date: 11/21/2019 CLINICAL DATA:  Left side body numbness. History of HIV and hypertension EXAM: CT HEAD WITHOUT CONTRAST TECHNIQUE: Contiguous axial images were obtained from the base of the skull through the vertex without intravenous contrast. COMPARISON:  04/20/2017 FINDINGS: Brain: No evidence of acute infarction, hemorrhage, hydrocephalus, extra-axial collection or mass lesion/mass effect. Interval but chronic appearing wedge-shaped infarct at the left stratum and internal capsule. Vascular: No hyperdense vessel or unexpected calcification. Skull: Normal. Negative for fracture or focal lesion. Sinuses/Orbits: No acute finding. IMPRESSION: 1. No acute finding. 2. 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 Electronically Signed   By: Monte Fantasia M.D.   On: 11/21/2019 04:07   DG Chest Port 1 View  Result Date: 11/21/2019 CLINICAL DATA:  Chest pain EXAM: PORTABLE CHEST 1 VIEW COMPARISON:  April 20, 2017 FINDINGS: The heart size and mediastinal contours are within normal limits. Streaky airspace opacities seen at the left lung base. The visualized skeletal structures are unremarkable. IMPRESSION: Streaky airspace opacity at the left lung base which could be due to atelectasis and/or infectious etiology. Electronically Signed   By: Prudencio Pair M.D.   On: 11/21/2019 03:21   ECHOCARDIOGRAM COMPLETE  Result Date: 11/21/2019    ECHOCARDIOGRAM REPORT   Patient Name:   Sharon Norris Date of Exam: 11/21/2019 Medical Rec #:  [id number]      Height:       63.0 in Accession #:    [id number]     Weight:       197.0 lb Date of Birth:  10-27-1955       BSA:          1.921 m Patient Age:    64 years       BP:           155/88 mmHg Patient Gender: F              HR:           98 bpm. Exam Location:  Inpatient  Procedure: 2D Echo STAT ECHO Indications:    acute coronary syndrome I24.9  History:        Patient has prior history of Echocardiogram examinations, most                 recent 05/26/2013. Risk Factors:Hypertension. HIV. former IV drug                 use.  Sonographer:    Administrator, sports RDCS (AE) Referring Phys: [phone] [facility]  Sonographer Comments: Technically difficult study due to poor echo windows, suboptimal apical window and suboptimal parasternal window. Image acquisition challenging due to patient body habitus and Image acquisition challenging due to respiratory motion. IMPRESSIONS  1. Mild intracavitary gradient. Peak velocity 1.78 m/s. Peak gradient 12.6 mmHg. Left ventricular ejection fraction, by estimation, is 60  to 65%. The left ventricle has normal function. The left ventricle has no regional wall motion abnormalities. There  is mild concentric left ventricular hypertrophy. Left ventricular diastolic parameters are consistent with Grade I diastolic dysfunction (impaired relaxation). Elevated left ventricular end-diastolic pressure.  2. Right ventricular systolic function is normal. The right ventricular size is normal. There is normal pulmonary artery systolic pressure.  3. The mitral valve is normal in structure. No evidence of mitral valve regurgitation. No evidence of mitral stenosis.  4. The aortic valve is normal in structure. Aortic valve regurgitation is not visualized. No aortic stenosis is present.  5. The inferior vena cava is normal in size with <50% respiratory variability, suggesting right atrial pressure of 8 mmHg. FINDINGS  Left Ventricle: Mild intracavitary gradient. Peak velocity 1.78 m/s. Peak gradient 12.6 mmHg. Left ventricular ejection fraction, by estimation, is 60 to 65%. The left ventricle has normal function. The left ventricle has no regional wall motion abnormalities. The left ventricular internal cavity size was normal in size. There is mild concentric left ventricular  hypertrophy. Left ventricular diastolic parameters are consistent with Grade I diastolic dysfunction (impaired relaxation). Elevated left ventricular end-diastolic pressure. Right Ventricle: The right ventricular size is normal. No increase in right ventricular wall thickness. Right ventricular systolic function is normal. There is normal pulmonary artery systolic pressure. The tricuspid regurgitant velocity is 2.24 m/s, and  with an assumed right atrial pressure of 8 mmHg, the estimated right ventricular systolic pressure is 97.0 mmHg. Left Atrium: Left atrial size was normal in size. Right Atrium: Right atrial size was normal in size. Pericardium: There is no evidence of pericardial effusion. Mitral Valve: The mitral valve is normal in structure. No evidence of mitral valve regurgitation. No evidence of mitral valve stenosis. Tricuspid Valve: The tricuspid valve is normal in structure. Tricuspid valve regurgitation is trivial. No evidence of tricuspid stenosis. Aortic Valve: The aortic valve is normal in structure. Aortic valve regurgitation is not visualized. No aortic stenosis is present. Pulmonic Valve: The pulmonic valve was normal in structure. Pulmonic valve regurgitation is not visualized. No evidence of pulmonic stenosis. Aorta: The aortic root is normal in size and structure. Venous: The inferior vena cava is normal in size with less than 50% respiratory variability, suggesting right atrial pressure of 8 mmHg. IAS/Shunts: No atrial level shunt detected by color flow Doppler.  LEFT VENTRICLE PLAX 2D LVIDd:         4.50 cm  Diastology LVIDs:         2.80 cm  LV e' medial:    6.31 cm/s LV PW:         1.30 cm  LV E/e' medial:  14.3 LV IVS:        1.20 cm  LV e' lateral:   5.44 cm/s LVOT diam:     1.90 cm  LV E/e' lateral: 16.5 LVOT Area:     2.84 cm  LEFT ATRIUM         Index LA diam:    3.50 cm 1.82 cm/m   AORTA Ao Root diam: 2.60 cm MITRAL VALVE               TRICUSPID VALVE MV Area (PHT): 2.99 cm    TR  Peak grad:   20.1 mmHg MV Decel Time: 254 msec    TR Vmax:        224.00 cm/s MV E velocity: 90.00 cm/s MV A velocity: 93.40 cm/s  SHUNTS MV E/A ratio:  0.96  Systemic Diam: 1.90 cm Skeet Latch MD Electronically signed by Skeet Latch MD Signature Date/Time: 11/21/2019/11:04:43 AM    Final    VAS [place] LOWER EXTREMITY VENOUS (DVT)  Result Date: 11/21/2019  Lower Venous DVT Study Indications: Edema.  Risk Factors: None identified. Limitations: Body habitus and poor ultrasound/tissue interface. Comparison Study: No prior studies. Performing Technologist: Oliver Hum RVT  Examination Guidelines: A complete evaluation includes B-mode imaging, spectral Doppler, color Doppler, and power Doppler as needed of all accessible portions of each vessel. Bilateral testing is considered an integral part of a complete examination. Limited examinations for reoccurring indications may be performed as noted. The reflux portion of the exam is performed with the patient in reverse Trendelenburg.  +---------+---------------+---------+-----------+----------+--------------+ RIGHT    CompressibilityPhasicitySpontaneityPropertiesThrombus Aging +---------+---------------+---------+-----------+----------+--------------+ CFV      Full           Yes      Yes                                 +---------+---------------+---------+-----------+----------+--------------+ SFJ      Full                                                        +---------+---------------+---------+-----------+----------+--------------+ FV Prox  Full                                                        +---------+---------------+---------+-----------+----------+--------------+ FV Mid   Full                                                        +---------+---------------+---------+-----------+----------+--------------+ FV DistalFull                                                         +---------+---------------+---------+-----------+----------+--------------+ PFV      Full                                                        +---------+---------------+---------+-----------+----------+--------------+ POP      Full           Yes      Yes                                 +---------+---------------+---------+-----------+----------+--------------+ PTV      Full                                                        +---------+---------------+---------+-----------+----------+--------------+  PERO                                                  Not visualized +---------+---------------+---------+-----------+----------+--------------+   +---------+---------------+---------+-----------+----------+--------------+ LEFT     CompressibilityPhasicitySpontaneityPropertiesThrombus Aging +---------+---------------+---------+-----------+----------+--------------+ CFV      Full           Yes      Yes                                 +---------+---------------+---------+-----------+----------+--------------+ SFJ      Full                                                        +---------+---------------+---------+-----------+----------+--------------+ FV Prox  Full                                                        +---------+---------------+---------+-----------+----------+--------------+ FV Mid   Full                                                        +---------+---------------+---------+-----------+----------+--------------+ FV DistalFull                                                        +---------+---------------+---------+-----------+----------+--------------+ PFV      Full                                                        +---------+---------------+---------+-----------+----------+--------------+ POP      Full           Yes      Yes                                  +---------+---------------+---------+-----------+----------+--------------+ PTV      Full                                                        +---------+---------------+---------+-----------+----------+--------------+ PERO                                                  Not visualized +---------+---------------+---------+-----------+----------+--------------+  Summary: RIGHT: - There is no evidence of deep vein thrombosis in the lower extremity. However, portions of this examination were limited- see technologist comments above.  - No cystic structure found in the popliteal fossa.  LEFT: - There is no evidence of deep vein thrombosis in the lower extremity. However, portions of this examination were limited- see technologist comments above.  - No cystic structure found in the popliteal fossa.  *See table(s) above for measurements and observations.    Preliminary      Scheduled Meds: . abacavir-dolutegravir-lamiVUDine  1 tablet Oral Daily  . aspirin EC  81 mg Oral Daily  . atorvastatin  80 mg Oral Daily  . carvedilol  6.25 mg Oral BID WC  . folic acid  1 mg Oral Daily  . insulin aspart  0-9 Units Subcutaneous Q4H  . LORazepam  0-4 mg Intravenous Q4H   Followed by  . [START ON 11/23/2019] LORazepam  0-4 mg Intravenous Q8H  . multivitamin with minerals  1 tablet Oral Daily  . sodium chloride flush  3 mL Intravenous Q12H  . sodium chloride flush  3 mL Intravenous Q12H  . thiamine  100 mg Oral Daily   Or  . thiamine  100 mg Intravenous Daily   Continuous Infusions: . sodium chloride    . sodium chloride    . cefTRIAXone (ROCEPHIN)  IV 2 g (11/22/19 9563)  . doxycycline (VIBRAMYCIN) IV Stopped (11/22/19 0134)  . heparin 1,000 Units/hr (11/22/19 0508)  . nitroGLYCERIN 80 mcg/min (11/22/19 0555)     LOS: 1 day    Time spent: [place], MD [facility] To contact the attending provider between 7A-7P or the covering provider during after hours 7P-7A,  please log into the web site [email or web address] and access using universal [place] password for that web site. If you do not have the password, please call the hospital operator.  11/22/2019, 7:00 AM

## 2019-11-22 NOTE — Plan of Care (Signed)

## 2019-11-22 NOTE — Progress Notes (Addendum)
VAST consulted to obtain IV access. Upon arrival to room, pt naked and yelling at nurses. Pt's nurse Suezanne Jacquet advised this is not the best time to attempt IV placement. He will enter consult for IV team once PO medication has taken effect and IV can be placed.  PICC has been ordered as patient has had 4 IV's in less then 24 hrs; 3 which infiltrated.

## 2019-11-22 NOTE — Progress Notes (Signed)
Progress Note  Patient Name: Sharon Norris Due Date of Encounter: 11/22/2019  Primary Cardiologist:   No primary care provider on file.   Subjective   She is not reporting chest pain as severe as it was although she is confused and agitated so the history is incomplete.    Inpatient Medications    Scheduled Meds: . abacavir-dolutegravir-lamiVUDine  1 tablet Oral Daily  . aspirin EC  81 mg Oral Daily  . atorvastatin  80 mg Oral Daily  . carvedilol  6.25 mg Oral BID WC  . folic acid  1 mg Oral Daily  . insulin aspart  0-9 Units Subcutaneous Q4H  . LORazepam  0-4 mg Intravenous Q4H   Followed by  . [START ON 11/23/2019] LORazepam  0-4 mg Intravenous Q8H  . multivitamin with minerals  1 tablet Oral Daily  . sodium chloride flush  3 mL Intravenous Q12H  . sodium chloride flush  3 mL Intravenous Q12H  . thiamine  100 mg Oral Daily   Or  . thiamine  100 mg Intravenous Daily   Continuous Infusions: . sodium chloride    . sodium chloride    . cefTRIAXone (ROCEPHIN)  IV 2 g (11/22/19 5009)  . doxycycline (VIBRAMYCIN) IV Stopped (11/22/19 0134)  . heparin 1,000 Units/hr (11/22/19 0508)  . nitroGLYCERIN 80 mcg/min (11/22/19 0555)   PRN Meds: sodium chloride, sodium chloride, acetaminophen, LORazepam **OR** LORazepam, sodium chloride flush, sodium chloride flush   Vital Signs    Vitals:   11/21/19 1807 11/21/19 1945 11/22/19 0417 11/22/19 0747  BP: (!) 130/91 128/66  (!) 140/96  Pulse: 100 97    Resp: 19 16    Temp: 98.2 F (36.8 C) 98.6 F (37 C)  98.6 F (37 C)  TempSrc: Oral Oral  Oral  SpO2: 100% 99%    Weight:   90.3 kg     Intake/Output Summary (Last 24 hours) at 11/22/2019 0838 Last data filed at 11/22/2019 0400 Gross per 24 hour  Intake 843.12 ml  Output --  Net 843.12 ml   Filed Weights   11/22/19 0417  Weight: 90.3 kg    Telemetry    NSR - Personally Reviewed  ECG    NSR, rate 96, QT shortened.  - Personally Reviewed  Physical Exam   GEN: No  acute distress.  Confused and agitated Neck: No  JVD Cardiac: RRR, no murmurs, rubs, or gallops.  Respiratory:    Decreased breath sounds bilaterally.   GI: Soft, nontender, non-distended  MS: No  edema; No deformity. Neuro:  Nonfocal  Psych: Normal affect   Labs    Chemistry Recent Labs  Lab 11/21/19 0253 11/21/19 0604  NA 144 139  K 3.8 3.7  CL 109 109  CO2 18* 18*  GLUCOSE 224* 180*  BUN 39* 37*  CREATININE 2.28* 2.04*  CALCIUM 9.1 8.5*  PROT 8.7* 7.6  ALBUMIN 3.8 3.5  AST 37 36  ALT 23 22  ALKPHOS 68 63  BILITOT 0.7 0.5  GFRNONAA 22* 25*  GFRAA 25* 29*  ANIONGAP 17* 12     Hematology Recent Labs  Lab 11/21/19 0253 11/21/19 0604  WBC 15.5* 14.1*  RBC 3.41* 3.14*  HGB 11.4* 10.3*  HCT 34.8* 32.8*  MCV 102.1* 104.5*  MCH 33.4 32.8  MCHC 32.8 31.4  RDW 13.2 13.3  PLT 238 223    Cardiac EnzymesNo results for input(s): TROPONINI in the last 168 hours. No results for input(s): TROPIPOC in the last 168 hours.  BNPNo results for input(s): BNP, PROBNP in the last 168 hours.   DDimer  Recent Labs  Lab 11/21/19 0403  DDIMER 0.96*     Radiology    CT HEAD WO CONTRAST  Result Date: 11/21/2019 CLINICAL DATA:  Left side body numbness. History of HIV and hypertension EXAM: CT HEAD WITHOUT CONTRAST TECHNIQUE: Contiguous axial images were obtained from the base of the skull through the vertex without intravenous contrast. COMPARISON:  04/20/2017 FINDINGS: Brain: No evidence of acute infarction, hemorrhage, hydrocephalus, extra-axial collection or mass lesion/mass effect. Interval but chronic appearing wedge-shaped infarct at the left stratum and internal capsule. Vascular: No hyperdense vessel or unexpected calcification. Skull: Normal. Negative for fracture or focal lesion. Sinuses/Orbits: No acute finding. IMPRESSION: 1. No acute finding. 2. 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 Electronically Signed   By: Monte Fantasia  M.D.   On: 11/21/2019 04:07   DG Chest Port 1 View  Result Date: 11/21/2019 CLINICAL DATA:  Chest pain EXAM: PORTABLE CHEST 1 VIEW COMPARISON:  April 20, 2017 FINDINGS: The heart size and mediastinal contours are within normal limits. Streaky airspace opacities seen at the left lung base. The visualized skeletal structures are unremarkable. IMPRESSION: Streaky airspace opacity at the left lung base which could be due to atelectasis and/or infectious etiology. Electronically Signed   By: Prudencio Pair M.D.   On: 11/21/2019 03:21   ECHOCARDIOGRAM COMPLETE  Result Date: 11/21/2019    ECHOCARDIOGRAM REPORT   Patient Name:   JAMIELEE MCHALE Date of Exam: 11/21/2019 Medical Rec #:  [id number]      Height:       63.0 in Accession #:    [id number]     Weight:       197.0 lb Date of Birth:  09/02/1955       BSA:          1.921 m Patient Age:    64 years       BP:           155/88 mmHg Patient Gender: F              HR:           98 bpm. Exam Location:  Inpatient Procedure: 2D Echo STAT ECHO Indications:    acute coronary syndrome I24.9  History:        Patient has prior history of Echocardiogram examinations, most                 recent 05/26/2013. Risk Factors:Hypertension. HIV. former IV drug                 use.  Sonographer:    Administrator, sports RDCS (AE) Referring Phys: [phone] [facility]  Sonographer Comments: Technically difficult study due to poor echo windows, suboptimal apical window and suboptimal parasternal window. Image acquisition challenging due to patient body habitus and Image acquisition challenging due to respiratory motion. IMPRESSIONS  1. Mild intracavitary gradient. Peak velocity 1.78 m/s. Peak gradient 12.6 mmHg. Left ventricular ejection fraction, by estimation, is 60 to 65%. The left ventricle has normal function. The left ventricle has no regional wall motion abnormalities. There  is mild concentric left ventricular hypertrophy. Left ventricular diastolic parameters are consistent with Grade I  diastolic dysfunction (impaired relaxation). Elevated left ventricular end-diastolic pressure.  2. Right ventricular systolic function is normal. The right ventricular size is normal. There is normal pulmonary artery systolic pressure.  3. The mitral valve is normal in  structure. No evidence of mitral valve regurgitation. No evidence of mitral stenosis.  4. The aortic valve is normal in structure. Aortic valve regurgitation is not visualized. No aortic stenosis is present.  5. The inferior vena cava is normal in size with <50% respiratory variability, suggesting right atrial pressure of 8 mmHg. FINDINGS  Left Ventricle: Mild intracavitary gradient. Peak velocity 1.78 m/s. Peak gradient 12.6 mmHg. Left ventricular ejection fraction, by estimation, is 60 to 65%. The left ventricle has normal function. The left ventricle has no regional wall motion abnormalities. The left ventricular internal cavity size was normal in size. There is mild concentric left ventricular hypertrophy. Left ventricular diastolic parameters are consistent with Grade I diastolic dysfunction (impaired relaxation). Elevated left ventricular end-diastolic pressure. Right Ventricle: The right ventricular size is normal. No increase in right ventricular wall thickness. Right ventricular systolic function is normal. There is normal pulmonary artery systolic pressure. The tricuspid regurgitant velocity is 2.24 m/s, and  with an assumed right atrial pressure of 8 mmHg, the estimated right ventricular systolic pressure is 48.5 mmHg. Left Atrium: Left atrial size was normal in size. Right Atrium: Right atrial size was normal in size. Pericardium: There is no evidence of pericardial effusion. Mitral Valve: The mitral valve is normal in structure. No evidence of mitral valve regurgitation. No evidence of mitral valve stenosis. Tricuspid Valve: The tricuspid valve is normal in structure. Tricuspid valve regurgitation is trivial. No evidence of tricuspid  stenosis. Aortic Valve: The aortic valve is normal in structure. Aortic valve regurgitation is not visualized. No aortic stenosis is present. Pulmonic Valve: The pulmonic valve was normal in structure. Pulmonic valve regurgitation is not visualized. No evidence of pulmonic stenosis. Aorta: The aortic root is normal in size and structure. Venous: The inferior vena cava is normal in size with less than 50% respiratory variability, suggesting right atrial pressure of 8 mmHg. IAS/Shunts: No atrial level shunt detected by color flow Doppler.  LEFT VENTRICLE PLAX 2D LVIDd:         4.50 cm  Diastology LVIDs:         2.80 cm  LV e' medial:    6.31 cm/s LV PW:         1.30 cm  LV E/e' medial:  14.3 LV IVS:        1.20 cm  LV e' lateral:   5.44 cm/s LVOT diam:     1.90 cm  LV E/e' lateral: 16.5 LVOT Area:     2.84 cm  LEFT ATRIUM         Index LA diam:    3.50 cm 1.82 cm/m   AORTA Ao Root diam: 2.60 cm MITRAL VALVE               TRICUSPID VALVE MV Area (PHT): 2.99 cm    TR Peak grad:   20.1 mmHg MV Decel Time: 254 msec    TR Vmax:        224.00 cm/s MV E velocity: 90.00 cm/s MV A velocity: 93.40 cm/s  SHUNTS MV E/A ratio:  0.96        Systemic Diam: 1.90 cm Skeet Latch MD Electronically signed by Skeet Latch MD Signature Date/Time: 11/21/2019/11:04:43 AM    Final    VAS [place] LOWER EXTREMITY VENOUS (DVT)  Result Date: 11/21/2019  Lower Venous DVT Study Indications: Edema.  Risk Factors: None identified. Limitations: Body habitus and poor ultrasound/tissue interface. Comparison Study: No prior studies. Performing Technologist: Oliver Hum RVT  Examination Guidelines: A  complete evaluation includes B-mode imaging, spectral Doppler, color Doppler, and power Doppler as needed of all accessible portions of each vessel. Bilateral testing is considered an integral part of a complete examination. Limited examinations for reoccurring indications may be performed as noted. The reflux portion of the exam is performed  with the patient in reverse Trendelenburg.  +---------+---------------+---------+-----------+----------+--------------+ RIGHT    CompressibilityPhasicitySpontaneityPropertiesThrombus Aging +---------+---------------+---------+-----------+----------+--------------+ CFV      Full           Yes      Yes                                 +---------+---------------+---------+-----------+----------+--------------+ SFJ      Full                                                        +---------+---------------+---------+-----------+----------+--------------+ FV Prox  Full                                                        +---------+---------------+---------+-----------+----------+--------------+ FV Mid   Full                                                        +---------+---------------+---------+-----------+----------+--------------+ FV DistalFull                                                        +---------+---------------+---------+-----------+----------+--------------+ PFV      Full                                                        +---------+---------------+---------+-----------+----------+--------------+ POP      Full           Yes      Yes                                 +---------+---------------+---------+-----------+----------+--------------+ PTV      Full                                                        +---------+---------------+---------+-----------+----------+--------------+ PERO                                                  Not visualized +---------+---------------+---------+-----------+----------+--------------+   +---------+---------------+---------+-----------+----------+--------------+  LEFT     CompressibilityPhasicitySpontaneityPropertiesThrombus Aging +---------+---------------+---------+-----------+----------+--------------+ CFV      Full           Yes      Yes                                  +---------+---------------+---------+-----------+----------+--------------+ SFJ      Full                                                        +---------+---------------+---------+-----------+----------+--------------+ FV Prox  Full                                                        +---------+---------------+---------+-----------+----------+--------------+ FV Mid   Full                                                        +---------+---------------+---------+-----------+----------+--------------+ FV DistalFull                                                        +---------+---------------+---------+-----------+----------+--------------+ PFV      Full                                                        +---------+---------------+---------+-----------+----------+--------------+ POP      Full           Yes      Yes                                 +---------+---------------+---------+-----------+----------+--------------+ PTV      Full                                                        +---------+---------------+---------+-----------+----------+--------------+ PERO                                                  Not visualized +---------+---------------+---------+-----------+----------+--------------+     Summary: RIGHT: - There is no evidence of deep vein thrombosis in the lower extremity. However, portions of this examination were limited- see technologist comments above.  - No cystic structure found in the popliteal fossa.  LEFT: - There is no evidence of deep vein thrombosis in the lower extremity. However,  portions of this examination were limited- see technologist comments above.  - No cystic structure found in the popliteal fossa.  *See table(s) above for measurements and observations.    Preliminary     Cardiac Studies   ECHO:    1. Mild intracavitary gradient. Peak velocity 1.78 m/s. Peak gradient  12.6 mmHg. Left ventricular  ejection fraction, by estimation, is 60 to  65%. The left ventricle has normal function. The left ventricle has no  regional wall motion abnormalities. There  is mild concentric left ventricular hypertrophy. Left ventricular  diastolic parameters are consistent with Grade I diastolic dysfunction  (impaired relaxation). Elevated left ventricular end-diastolic pressure.  2. Right ventricular systolic function is normal. The right ventricular  size is normal. There is normal pulmonary artery systolic pressure.  3. The mitral valve is normal in structure. No evidence of mitral valve  regurgitation. No evidence of mitral stenosis.  4. The aortic valve is normal in structure. Aortic valve regurgitation is  not visualized. No aortic stenosis is present.  5. The inferior vena cava is normal in size with <50% respiratory  variability, suggesting right atrial pressure of 8 mmHg.   Patient Profile     64 y.o. female with a hx of HTN, CKD 3b-4, HIV (treated), alcohol abuse and remote IVDA who is being seen for the evaluation of chest pain and markedly elevated cardiac enzymes at the request of Dr. Wynelle Cleveland.  Assessment & Plan    NSTEMI:   Normal echo as above.  She is agitated and requiring benzos.  No plan for cath at this time.  She might be most appropriate for a non invasive ischemia evaluation when she is able to cooperate.  No change in therapy at this time.   Continue heparin for today   HTN:   BP is improved.  I will stop the IV NTG and start Imdur PO.  I will increase the PO beta blocker and if needed we can add calcium channel blocker and use hydralazine for PRN BP control.    Ac on CKD3b:   Hold ARB and diuretics.  Labs pending this morning.     Long QTc:   Repeaedt EKG. QT shortened but still slightly prolonged.  Ionized calcium pending.  Avoid QT prolonging drugs.    HLP:  Started on Lipitor this admission.  Can follow lipid profile as an out patient.    HIV:   On  Triumeq.  Abnormal CXR:   VQ was planned.  Negative lower extremity Doppler.    Plan for further imaging per primary team.  She would likely not be able to be cooperative for VQ.  Continue heparin as above.   Obesity/prediabetes: A1c 6.2%, not on meds.  Plan per primary team.     For questions or updates, please contact [place] Please consult [email or web address] for contact info under Cardiology/STEMI.   Signed, Minus Breeding, MD  11/22/2019, 8:38 AM

## 2019-11-22 NOTE — Progress Notes (Signed)
Spoke with floor RN re PICC order. Per RN PICC not needed anymore, will start PIV.

## 2019-11-22 NOTE — Progress Notes (Signed)
[place] for heparin Indication: chest pain/ACS  Allergies  Allergen Reactions  . Benadryl [Diphenhydramine Hcl] Hypertension  . Ribavirin Rash  . Viekira Pak [Ombitasvir-Paritaprevir-Ritonavir-Dasabuvir] Rash    Hives like reaction    Patient Measurements: Weight: 90.3 kg (199 lb 1.2 oz) Heparin Dosing Weight: 89.4kg  Vital Signs: Temp: 98.6 F (37 C) (10/06 0747) Temp Source: Oral (10/06 0747) BP: 140/96 (10/06 0747) Pulse Rate: 105 (10/06 0901)  Labs: Recent Labs    11/21/19 0253 11/21/19 0403 11/21/19 0453 11/21/19 0604 11/21/19 0736 11/21/19 1032 11/21/19 1430 11/22/19 0828  HGB 11.4*  --    < > 10.3*  --   --   --  10.1*  HCT 34.8*  --   --  32.8*  --   --   --  32.0*  PLT 238  --   --  223  --   --   --  208  APTT  --  27  --   --   --   --   --   --   LABPROT  --  13.1  --   --   --   --   --   --   INR  --  1.0  --   --   --   --   --   --   HEPARINUNFRC  --   --   --   --   --  0.40  --  <0.10*  CREATININE 2.28*  --   --  2.04*  --   --   --  1.49*  TROPONINIHS 1,051*  --    < >  --  3,440* 4,942* 4,910*  --    < > = values in this interval not displayed.    CrCl cannot be calculated (Unknown ideal weight.).   Assessment: 64 yo female presents to ED with numbness on the whole left side and burning pain in the left chest.  Pharmacy consulted to dose heparin for ACS/STEMI.  No prior AC noted  Heparin level drawn this am < 0.1 - but patient had ripped out IV and heparin dip off at that time IV team called for replacement  - will follow up later today for restart and follow up labs  Goal of Therapy:  Heparin level 0.3-0.7 units/ml Monitor platelets by anticoagulation protocol   Plan:   Follow up restart heparin  Bonnita Nasuti Pharm.D. CPP, BCPS Clinical Pharmacist [phone] 11/22/2019 11:18 AM
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
## 2019-11-23 DIAGNOSIS — I214 Non-ST elevation (NSTEMI) myocardial infarction: Secondary | ICD-10-CM | POA: Diagnosis not present

## 2019-11-23 LAB — GLUCOSE, CAPILLARY
Glucose-Capillary: 102 mg/dL — ABNORMAL HIGH (ref 70–99)
Glucose-Capillary: 108 mg/dL — ABNORMAL HIGH (ref 70–99)
Glucose-Capillary: 108 mg/dL — ABNORMAL HIGH (ref 70–99)
Glucose-Capillary: 111 mg/dL — ABNORMAL HIGH (ref 70–99)

## 2019-11-23 LAB — URINALYSIS, ROUTINE W REFLEX MICROSCOPIC
Bacteria, UA: NONE SEEN
Bilirubin Urine: NEGATIVE
Glucose, UA: NEGATIVE mg/dL
Hgb urine dipstick: NEGATIVE
Ketones, ur: NEGATIVE mg/dL
Leukocytes,Ua: NEGATIVE
Nitrite: NEGATIVE
Protein, ur: 30 mg/dL — AB
Specific Gravity, Urine: 1.012 (ref 1.005–1.030)
pH: 5 (ref 5.0–8.0)

## 2019-11-23 LAB — CBC WITH DIFFERENTIAL/PLATELET
Abs Immature Granulocytes: 0.05 10*3/uL (ref 0.00–0.07)
Basophils Absolute: 0 10*3/uL (ref 0.0–0.1)
Basophils Relative: 0 %
Eosinophils Absolute: 0.1 10*3/uL (ref 0.0–0.5)
Eosinophils Relative: 1 %
HCT: 29.8 % — ABNORMAL LOW (ref 36.0–46.0)
Hemoglobin: 9.6 g/dL — ABNORMAL LOW (ref 12.0–15.0)
Immature Granulocytes: 1 %
Lymphocytes Relative: 24 %
Lymphs Abs: 1.9 10*3/uL (ref 0.7–4.0)
MCH: 33.4 pg (ref 26.0–34.0)
MCHC: 32.2 g/dL (ref 30.0–36.0)
MCV: 103.8 fL — ABNORMAL HIGH (ref 80.0–100.0)
Monocytes Absolute: 0.7 10*3/uL (ref 0.1–1.0)
Monocytes Relative: 9 %
Neutro Abs: 5.2 10*3/uL (ref 1.7–7.7)
Neutrophils Relative %: 65 %
Platelets: ADEQUATE 10*3/uL (ref 150–400)
RBC: 2.87 MIL/uL — ABNORMAL LOW (ref 3.87–5.11)
RDW: 13.1 % (ref 11.5–15.5)
WBC: 7.9 10*3/uL (ref 4.0–10.5)
nRBC: 0 % (ref 0.0–0.2)

## 2019-11-23 LAB — RAPID URINE DRUG SCREEN, HOSP PERFORMED
Amphetamines: NOT DETECTED
Barbiturates: NOT DETECTED
Benzodiazepines: POSITIVE — AB
Cocaine: POSITIVE — AB
Opiates: NOT DETECTED
Tetrahydrocannabinol: NOT DETECTED

## 2019-11-23 LAB — COMPREHENSIVE METABOLIC PANEL
ALT: 14 U/L (ref 0–44)
AST: 28 U/L (ref 15–41)
Albumin: 2.9 g/dL — ABNORMAL LOW (ref 3.5–5.0)
Alkaline Phosphatase: 57 U/L (ref 38–126)
Anion gap: 13 (ref 5–15)
BUN: 13 mg/dL (ref 8–23)
CO2: 18 mmol/L — ABNORMAL LOW (ref 22–32)
Calcium: 8.7 mg/dL — ABNORMAL LOW (ref 8.9–10.3)
Chloride: 109 mmol/L (ref 98–111)
Creatinine, Ser: 1.42 mg/dL — ABNORMAL HIGH (ref 0.44–1.00)
GFR calc non Af Amer: 38 mL/min — ABNORMAL LOW (ref >60)
Glucose, Bld: 115 mg/dL — ABNORMAL HIGH (ref 70–99)
Potassium: 4.1 mmol/L (ref 3.5–5.1)
Sodium: 140 mmol/L (ref 135–145)
Total Bilirubin: 0.7 mg/dL (ref 0.3–1.2)
Total Protein: 6.7 g/dL (ref 6.5–8.1)

## 2019-11-23 LAB — AMMONIA: Ammonia: 46 umol/L — ABNORMAL HIGH (ref 9–35)

## 2019-11-23 MED ORDER — NICOTINE 14 MG/24HR TD PT24
14.0000 mg | MEDICATED_PATCH | Freq: Every day | TRANSDERMAL | Status: DC
  Administered 2019-11-25 – 2019-11-29 (×4): 14 mg via TRANSDERMAL
  Filled 2019-11-23 (×6): qty 1

## 2019-11-23 MED ORDER — LACTULOSE 10 GM/15ML PO SOLN
20.0000 g | Freq: Two times a day (BID) | ORAL | Status: DC
  Administered 2019-11-23 – 2019-11-24 (×3): 20 g via ORAL
  Filled 2019-11-23 (×3): qty 30

## 2019-11-23 MED ORDER — HYDRALAZINE HCL 20 MG/ML IJ SOLN
10.0000 mg | Freq: Four times a day (QID) | INTRAMUSCULAR | Status: DC | PRN
  Filled 2019-11-23: qty 1

## 2019-11-23 MED ORDER — NICOTINE POLACRILEX 2 MG MT GUM
2.0000 mg | CHEWING_GUM | OROMUCOSAL | Status: DC | PRN
  Filled 2019-11-23: qty 1

## 2019-11-23 MED ORDER — LACTULOSE ENEMA
300.0000 mL | Freq: Every day | ORAL | Status: DC
  Administered 2019-11-24: 300 mL via RECTAL
  Filled 2019-11-23 (×2): qty 300

## 2019-11-23 NOTE — Plan of Care (Signed)
  Problem: Clinical Measurements: Goal: Ability to maintain clinical measurements within normal limits will improve Outcome: Progressing Goal: Respiratory complications will improve Outcome: Progressing Goal: Cardiovascular complication will be avoided Outcome: Progressing   

## 2019-11-23 NOTE — Progress Notes (Signed)
On d/w daughter on phone, patient was much weaker in L hand and L side yesterday and needed assist, which is unusual Reviewed CT head form 10/5--has a remote stroke new from 2019 Will order MRI brain which may help clarifying if this is something organic vs withdrawal  Verneita Griffes, MD [facility] 4:30 PM

## 2019-11-23 NOTE — Progress Notes (Signed)
Progress Note  Patient Name: Sharon Norris Date of Encounter: 11/23/2019  [facility] Cardiologist: No primary care provider on file. New  Subjective   Rolling in bed, agitated, shouting. Apparently was aggressive to staff yesterday.  Inpatient Medications    Scheduled Meds: . abacavir-dolutegravir-lamiVUDine  1 tablet Oral Daily  . aspirin EC  81 mg Oral Daily  . atorvastatin  80 mg Oral Daily  . carvedilol  12.5 mg Oral BID WC  . chlordiazePOXIDE  50 mg Oral Once  . enoxaparin (LOVENOX) injection  90 mg Subcutaneous Q12H  . folic acid  1 mg Oral Daily  . insulin aspart  0-9 Units Subcutaneous Q4H  . isosorbide mononitrate  120 mg Oral Daily  . LORazepam  0-4 mg Intravenous Q8H  . LORazepam  5 mg Intramuscular Q4H  . multivitamin with minerals  1 tablet Oral Daily  . thiamine  100 mg Oral Daily   Or  . thiamine  100 mg Intravenous Daily   Continuous Infusions: . cefTRIAXone (ROCEPHIN)  IV 2 g (11/23/19 7893)  . doxycycline (VIBRAMYCIN) IV 100 mg (11/23/19 0700)   PRN Meds: acetaminophen, hydrALAZINE, LORazepam **OR** LORazepam   Vital Signs    Vitals:   11/22/19 1944 11/23/19 0338 11/23/19 0500 11/23/19 0758  BP: 138/72 (!) 167/87  (!) 123/98  Pulse:  88  94  Resp: 20 (!) 22  (!) 21  Temp: 98 F (36.7 C) 98.5 F (36.9 C)  98.5 F (36.9 C)  TempSrc: Oral Axillary  Axillary  SpO2:  99%  100%  Weight:   90.2 kg   Height:        Intake/Output Summary (Last 24 hours) at 11/23/2019 0825 Last data filed at 11/22/2019 2300 Gross per 24 hour  Intake 226.83 ml  Output 400 ml  Net -173.17 ml   Last 3 Weights 11/23/2019 11/22/2019 04/10/2019  Weight (lbs) 198 lb 13.7 oz 199 lb 1.2 oz 197 lb  Weight (kg) 90.2 kg 90.3 kg 89.359 kg      Telemetry    NSR - Personally Reviewed  ECG    11/22/2019 NSR, LVH, nonspecific mild ST elevation (0.5 mm) V1- V3, QTc 432  - Personally Reviewed  Physical Exam  Obese GEN: No acute distress.   Neck: No JVD Cardiac:  RRR, no murmurs, rubs, or gallops.  Respiratory: Clear to auscultation bilaterally. GI: Soft, nontender, non-distended  MS: No edema; No deformity. Neuro:  Nonfocal  Psych: Normal affect   Labs    High Sensitivity Troponin:   Recent Labs  Lab 11/21/19 0253 11/21/19 0453 11/21/19 0736 11/21/19 1032 11/21/19 1430  TROPONINIHS 1,051* 1,493* 3,440* 4,942* 4,910*      Chemistry Recent Labs  Lab 11/21/19 0253 11/21/19 0253 11/21/19 0604 11/22/19 0828 11/23/19 0104  NA 144   < > 139 139 140  K 3.8   < > 3.7 3.7 4.1  CL 109   < > 109 107 109  CO2 18*   < > 18* 19* 18*  GLUCOSE 224*   < > 180* 137* 115*  BUN 39*   < > 37* 18 13  CREATININE 2.28*   < > 2.04* 1.49* 1.42*  CALCIUM 9.1   < > 8.5* 8.3* 8.7*  PROT 8.7*  --  7.6  --  6.7  ALBUMIN 3.8  --  3.5  --  2.9*  AST 37  --  36  --  28  ALT 23  --  22  --  14  ALKPHOS 68  --  63  --  57  BILITOT 0.7  --  0.5  --  0.7  GFRNONAA 22*   < > 25* 36* 38*  GFRAA 25*  --  29*  --   --   ANIONGAP 17*   < > 12 13 13    < > = values in this interval not displayed.     Hematology Recent Labs  Lab 11/21/19 0604 11/22/19 0828 11/23/19 0104  WBC 14.1* 8.3 7.9  RBC 3.14* 3.09* 2.87*  HGB 10.3* 10.1* 9.6*  HCT 32.8* 32.0* 29.8*  MCV 104.5* 103.6* 103.8*  MCH 32.8 32.7 33.4  MCHC 31.4 31.6 32.2  RDW 13.3 13.3 13.1  PLT 223 208 PLATELET CLUMPS NOTED ON SMEAR, COUNT APPEARS ADEQUATE    BNPNo results for input(s): BNP, PROBNP in the last 168 hours.   DDimer  Recent Labs  Lab 11/21/19 0403  DDIMER 0.96*     Radiology    ECHOCARDIOGRAM COMPLETE  Result Date: 11/21/2019    ECHOCARDIOGRAM REPORT   Patient Name:   KORRI ASK Date of Exam: 11/21/2019 Medical Rec #:  [id number]      Height:       63.0 in Accession #:    [id number]     Weight:       197.0 lb Date of Birth:  1955/08/25       BSA:          1.921 m Patient Age:    31 years       BP:           155/88 mmHg Patient Gender: F              HR:           98 bpm. Exam  Location:  Inpatient Procedure: 2D Echo STAT ECHO Indications:    acute coronary syndrome I24.9  History:        Patient has prior history of Echocardiogram examinations, most                 recent 05/26/2013. Risk Factors:Hypertension. HIV. former IV drug                 use.  Sonographer:    Administrator, sports RDCS (AE) Referring Phys: [phone] [facility] Rielly Brunn  Sonographer Comments: Technically difficult study due to poor echo windows, suboptimal apical window and suboptimal parasternal window. Image acquisition challenging due to patient body habitus and Image acquisition challenging due to respiratory motion. IMPRESSIONS  1. Mild intracavitary gradient. Peak velocity 1.78 m/s. Peak gradient 12.6 mmHg. Left ventricular ejection fraction, by estimation, is 60 to 65%. The left ventricle has normal function. The left ventricle has no regional wall motion abnormalities. There  is mild concentric left ventricular hypertrophy. Left ventricular diastolic parameters are consistent with Grade I diastolic dysfunction (impaired relaxation). Elevated left ventricular end-diastolic pressure.  2. Right ventricular systolic function is normal. The right ventricular size is normal. There is normal pulmonary artery systolic pressure.  3. The mitral valve is normal in structure. No evidence of mitral valve regurgitation. No evidence of mitral stenosis.  4. The aortic valve is normal in structure. Aortic valve regurgitation is not visualized. No aortic stenosis is present.  5. The inferior vena cava is normal in size with <50% respiratory variability, suggesting right atrial pressure of 8 mmHg. FINDINGS  Left Ventricle: Mild intracavitary gradient. Peak velocity 1.78 m/s. Peak gradient 12.6 mmHg. Left ventricular ejection fraction, by estimation, is 60  to 65%. The left ventricle has normal function. The left ventricle has no regional wall motion abnormalities. The left ventricular internal cavity size was normal in size. There is mild  concentric left ventricular hypertrophy. Left ventricular diastolic parameters are consistent with Grade I diastolic dysfunction (impaired relaxation). Elevated left ventricular end-diastolic pressure. Right Ventricle: The right ventricular size is normal. No increase in right ventricular wall thickness. Right ventricular systolic function is normal. There is normal pulmonary artery systolic pressure. The tricuspid regurgitant velocity is 2.24 m/s, and  with an assumed right atrial pressure of 8 mmHg, the estimated right ventricular systolic pressure is 51.7 mmHg. Left Atrium: Left atrial size was normal in size. Right Atrium: Right atrial size was normal in size. Pericardium: There is no evidence of pericardial effusion. Mitral Valve: The mitral valve is normal in structure. No evidence of mitral valve regurgitation. No evidence of mitral valve stenosis. Tricuspid Valve: The tricuspid valve is normal in structure. Tricuspid valve regurgitation is trivial. No evidence of tricuspid stenosis. Aortic Valve: The aortic valve is normal in structure. Aortic valve regurgitation is not visualized. No aortic stenosis is present. Pulmonic Valve: The pulmonic valve was normal in structure. Pulmonic valve regurgitation is not visualized. No evidence of pulmonic stenosis. Aorta: The aortic root is normal in size and structure. Venous: The inferior vena cava is normal in size with less than 50% respiratory variability, suggesting right atrial pressure of 8 mmHg. IAS/Shunts: No atrial level shunt detected by color flow Doppler.  LEFT VENTRICLE PLAX 2D LVIDd:         4.50 cm  Diastology LVIDs:         2.80 cm  LV e' medial:    6.31 cm/s LV PW:         1.30 cm  LV E/e' medial:  14.3 LV IVS:        1.20 cm  LV e' lateral:   5.44 cm/s LVOT diam:     1.90 cm  LV E/e' lateral: 16.5 LVOT Area:     2.84 cm  LEFT ATRIUM         Index LA diam:    3.50 cm 1.82 cm/m   AORTA Ao Root diam: 2.60 cm MITRAL VALVE               TRICUSPID VALVE MV  Area (PHT): 2.99 cm    TR Peak grad:   20.1 mmHg MV Decel Time: 254 msec    TR Vmax:        224.00 cm/s MV E velocity: 90.00 cm/s MV A velocity: 93.40 cm/s  SHUNTS MV E/A ratio:  0.96        Systemic Diam: 1.90 cm Skeet Latch MD Electronically signed by Skeet Latch MD Signature Date/Time: 11/21/2019/11:04:43 AM    Final    VAS [place] LOWER EXTREMITY VENOUS (DVT)  Result Date: 11/22/2019  Lower Venous DVT Study Indications: Edema.  Risk Factors: None identified. Limitations: Body habitus and poor ultrasound/tissue interface. Comparison Study: No prior studies. Performing Technologist: Oliver Hum RVT  Examination Guidelines: A complete evaluation includes B-mode imaging, spectral Doppler, color Doppler, and power Doppler as needed of all accessible portions of each vessel. Bilateral testing is considered an integral part of a complete examination. Limited examinations for reoccurring indications may be performed as noted. The reflux portion of the exam is performed with the patient in reverse Trendelenburg.  +---------+---------------+---------+-----------+----------+--------------+ RIGHT    CompressibilityPhasicitySpontaneityPropertiesThrombus Aging +---------+---------------+---------+-----------+----------+--------------+ CFV      Full  Yes      Yes                                 +---------+---------------+---------+-----------+----------+--------------+ SFJ      Full                                                        +---------+---------------+---------+-----------+----------+--------------+ FV Prox  Full                                                        +---------+---------------+---------+-----------+----------+--------------+ FV Mid   Full                                                        +---------+---------------+---------+-----------+----------+--------------+ FV DistalFull                                                         +---------+---------------+---------+-----------+----------+--------------+ PFV      Full                                                        +---------+---------------+---------+-----------+----------+--------------+ POP      Full           Yes      Yes                                 +---------+---------------+---------+-----------+----------+--------------+ PTV      Full                                                        +---------+---------------+---------+-----------+----------+--------------+ PERO                                                  Not visualized +---------+---------------+---------+-----------+----------+--------------+   +---------+---------------+---------+-----------+----------+--------------+ LEFT     CompressibilityPhasicitySpontaneityPropertiesThrombus Aging +---------+---------------+---------+-----------+----------+--------------+ CFV      Full           Yes      Yes                                 +---------+---------------+---------+-----------+----------+--------------+ SFJ      Full                                                        +---------+---------------+---------+-----------+----------+--------------+  FV Prox  Full                                                        +---------+---------------+---------+-----------+----------+--------------+ FV Mid   Full                                                        +---------+---------------+---------+-----------+----------+--------------+ FV DistalFull                                                        +---------+---------------+---------+-----------+----------+--------------+ PFV      Full                                                        +---------+---------------+---------+-----------+----------+--------------+ POP      Full           Yes      Yes                                  +---------+---------------+---------+-----------+----------+--------------+ PTV      Full                                                        +---------+---------------+---------+-----------+----------+--------------+ PERO                                                  Not visualized +---------+---------------+---------+-----------+----------+--------------+     Summary: RIGHT: - There is no evidence of deep vein thrombosis in the lower extremity. However, portions of this examination were limited- see technologist comments above.  - No cystic structure found in the popliteal fossa.  LEFT: - There is no evidence of deep vein thrombosis in the lower extremity. However, portions of this examination were limited- see technologist comments above.  - No cystic structure found in the popliteal fossa.  *See table(s) above for measurements and observations. Electronically signed by Harold Barban MD on 11/22/2019 at 9:05:04 PM.    Final    [place] EKG SITE RITE  Result Date: 11/22/2019 If Site Rite image not attached, placement could not be confirmed due to current cardiac rhythm.   Cardiac Studies   11/21/2019 ECHO  1. Mild intracavitary gradient. Peak velocity 1.78 m/s. Peak gradient  12.6 mmHg. Left ventricular ejection fraction, by estimation, is 60 to  65%. The left ventricle has normal function. The left ventricle has no  regional wall motion abnormalities. There  is mild concentric left ventricular hypertrophy. Left  ventricular  diastolic parameters are consistent with Grade I diastolic dysfunction  (impaired relaxation). Elevated left ventricular end-diastolic pressure.  2. Right ventricular systolic function is normal. The right ventricular  size is normal. There is normal pulmonary artery systolic pressure.  3. The mitral valve is normal in structure. No evidence of mitral valve  regurgitation. No evidence of mitral stenosis.  4. The aortic valve is normal in structure.  Aortic valve regurgitation is  not visualized. No aortic stenosis is present.  5. The inferior vena cava is normal in size with <50% respiratory  variability, suggesting right atrial pressure of 8 mmHg.     Patient Profile     64 y.o. female with a hx of HTN, CKD 3b-4, HIV (treated), alcohol abuse and remote IVDA, presenting with pleuritic chest pain and elevated cardiac enzymes  Assessment & Plan    1. Altered mental status: unable to ask about symptoms. UDS not yet sent. Is this ETOH withdrawal? 2. Chest pain: sounded more pleuritic than anginal, but hs troponin >4000. No active STEMI by ECG and no wall motion abnormalities on echo, but ECG changes may be due to myopericarditis. Renal function improved. Plan coronary CT angio when able to cooperate (or invasive coronary angio if new convincing evidence of coronary event). Recheck ECG. 3. HTN: improved, although higher again this AM when agitated. 4. CKD 3b: creatinine back to baseline. 5. HLP: worsened lipid profile possibly due to protease inh, started on high dose atorvastatin. Has prediabetes. 6. Long QTc:  Improved. Hypocalcemia +/- ischemia.      For questions or updates, please contact [place] Please consult [email or web address] for contact info under        Signed, Sanda Klein, MD  11/23/2019, 8:25 AM

## 2019-11-23 NOTE — Progress Notes (Signed)
PROGRESS NOTE    Sharon Norris  SJG:[id number] DOB: December 18, 1955 DOA: 11/21/2019 PCP: [name], MD followed by internal medicine teaching service  Brief Narrative:  64 WF known history of HIV followed by infectious disease Dr. Johnnye Sima Prior hep C Prior drug abuse Prior admission 05/25/2013-05/27/2013 noncardiac chest pain Seen for video visit 11/01/2019 and was doing fair at this time Admitted 11/21/2019 with left-sided numbness pins-and-needles burning in chest-EMS noted patient extremely anxious-EKG sinus tach rate 120 CXR atelectasis?  Infection WBC 50 high-sensitivity troponin 1051 cardiology consulted  Cardiology was consulted because of high-sensitivity troponin found noninvasive strategy was preferable given her noncooperation  Assessment & Plan:   Principal Problem:   NSTEMI (non-ST elevated myocardial infarction) ([place]) Active Problems:   Human immunodeficiency virus (HIV) disease ([place])   Essential hypertension   CKD (chronic kidney disease), stage IV (HCC)   SIRS (systemic inflammatory response syndrome) (HCC)   Numbness on left side   Prolonged QT interval   Alcohol withdrawal (HCC)   Hyperglycemia  1. Toxic metabolic encephalopathy 2. Hep C?  Encephalopathy related to this versus alcohol or other illicits a. Alcohol withdrawal scale CIWA 16 to 19 range if continues to require the same may need eventually to be on Precedex b. Await drug screen to be done as it was not done on admission c. Ammonia 46, patient refusing oral lactulose d. Give lactulose enema 3. NSTEMI versus myocarditis?  Other causes a. Earlier had poor IV access therefore did change heparin to Lovenox b. Echo shows HFpEF but no WM anomaly-defer to cardiology further planning 4. ?  Pneumonia on admission a. Continue ceftriaxone IV doxycycline IV b. Repeat procalcitonin is low c. Repeat chest x-ray two-view 10/8 and narrow to oral antibiotics a.m. if all normal 5. HTN a. Patient refusing some meds so  may need to transition back to IV from oral meds 6. Acute superimposed on chronic CKD 3 a. Peak 39/2.2 and is improving b. Try to avoid nephrotoxins 7. Prolonged QTC 8. HIV a. Continue triumeq 1 tablet daily-needs outpatient follow-up with infectious disease 9. A1c 6.1 with obesity 10. BMI 35 a. Outpatient characterization follow-up she may have gout use involvement of the near future  DVT prophylaxis:  Code Status:  Family Communication: Disposition:   Status is: Inpatient  Remains inpatient appropriate because:Hemodynamically unstable, Altered mental status, Ongoing diagnostic testing needed not appropriate for outpatient work up and IV treatments appropriate due to intensity of illness or inability to take PO   Dispo: The patient is from: Home              Anticipated d/c is to: SNF              Anticipated d/c date is: 2 days              Patient currently is not medically stable to d/c.   Consultants:   Cardiology  Procedures: Duplex lower extremities 10/5 Echo 10/5  Antimicrobials: Ceftriaxone doxycycline   Subjective:  Quite sleepy but received Ativan earlier this morning as was agitated Nursing reports later on patient is refusing lactulose p.o. It is difficult to obtain history from patient She still remains in restraints which are deemed medically necessary  Objective: Vitals:   11/22/19 1944 11/23/19 0338 11/23/19 0500 11/23/19 0758  BP: 138/72 (!) 167/87  (!) 123/98  Pulse:  88  94  Resp: 20 (!) 22  (!) 21  Temp: 98 F (36.7 C) 98.5 F (36.9 C)  98.5 F (36.9  C)  TempSrc: Oral Axillary  Axillary  SpO2:  99%  100%  Weight:   90.2 kg   Height:        Intake/Output Summary (Last 24 hours) at 11/23/2019 1250 Last data filed at 11/23/2019 0900 Gross per 24 hour  Intake 226.83 ml  Output 1100 ml  Net -873.17 ml   Filed Weights   11/22/19 0417 11/23/19 0500  Weight: 90.3 kg 90.2 kg    Examination:  General exam: Sleepy arousable Respiratory  system: Chest clear no rales rhonchi Cardiovascular system: s1 s2no m/r/g sinus rhythm and PVCs on monitor Gastrointestinal system: soft nd nt no rebound. Central nervous system: Quite confused but is able to move all 4 limbs equally Throat Extremities: none Skin: soft nt nd no swelling Psychiatry:quite agitated and cursing in bed  Data Reviewed: I have personally reviewed following labs and imaging studies BUN/Creat 37/2.0--->18/1.49-->13/1.4 LFTs normal Hemoglobin 9.6 MCV 103 platelet clumping Trop  1,493--->4,910 >4900  Radiology Studies: VAS [place] LOWER EXTREMITY VENOUS (DVT)  Result Date: 11/22/2019  Lower Venous DVT Study Indications: Edema.  Risk Factors: None identified. Limitations: Body habitus and poor ultrasound/tissue interface. Comparison Study: No prior studies. Performing Technologist: Oliver Hum RVT  Examination Guidelines: A complete evaluation includes B-mode imaging, spectral Doppler, color Doppler, and power Doppler as needed of all accessible portions of each vessel. Bilateral testing is considered an integral part of a complete examination. Limited examinations for reoccurring indications may be performed as noted. The reflux portion of the exam is performed with the patient in reverse Trendelenburg.  +---------+---------------+---------+-----------+----------+--------------+ RIGHT    CompressibilityPhasicitySpontaneityPropertiesThrombus Aging +---------+---------------+---------+-----------+----------+--------------+ CFV      Full           Yes      Yes                                 +---------+---------------+---------+-----------+----------+--------------+ SFJ      Full                                                        +---------+---------------+---------+-----------+----------+--------------+ FV Prox  Full                                                        +---------+---------------+---------+-----------+----------+--------------+  FV Mid   Full                                                        +---------+---------------+---------+-----------+----------+--------------+ FV DistalFull                                                        +---------+---------------+---------+-----------+----------+--------------+ PFV      Full                                                        +---------+---------------+---------+-----------+----------+--------------+  POP      Full           Yes      Yes                                 +---------+---------------+---------+-----------+----------+--------------+ PTV      Full                                                        +---------+---------------+---------+-----------+----------+--------------+ PERO                                                  Not visualized +---------+---------------+---------+-----------+----------+--------------+   +---------+---------------+---------+-----------+----------+--------------+ LEFT     CompressibilityPhasicitySpontaneityPropertiesThrombus Aging +---------+---------------+---------+-----------+----------+--------------+ CFV      Full           Yes      Yes                                 +---------+---------------+---------+-----------+----------+--------------+ SFJ      Full                                                        +---------+---------------+---------+-----------+----------+--------------+ FV Prox  Full                                                        +---------+---------------+---------+-----------+----------+--------------+ FV Mid   Full                                                        +---------+---------------+---------+-----------+----------+--------------+ FV DistalFull                                                        +---------+---------------+---------+-----------+----------+--------------+ PFV      Full                                                         +---------+---------------+---------+-----------+----------+--------------+ POP      Full           Yes      Yes                                 +---------+---------------+---------+-----------+----------+--------------+  PTV      Full                                                        +---------+---------------+---------+-----------+----------+--------------+ PERO                                                  Not visualized +---------+---------------+---------+-----------+----------+--------------+     Summary: RIGHT: - There is no evidence of deep vein thrombosis in the lower extremity. However, portions of this examination were limited- see technologist comments above.  - No cystic structure found in the popliteal fossa.  LEFT: - There is no evidence of deep vein thrombosis in the lower extremity. However, portions of this examination were limited- see technologist comments above.  - No cystic structure found in the popliteal fossa.  *See table(s) above for measurements and observations. Electronically signed by Harold Barban MD on 11/22/2019 at 9:05:04 PM.    Final    [place] EKG SITE RITE  Result Date: 11/22/2019 If Site Rite image not attached, placement could not be confirmed due to current cardiac rhythm.    Scheduled Meds: . abacavir-dolutegravir-lamiVUDine  1 tablet Oral Daily  . aspirin EC  81 mg Oral Daily  . atorvastatin  80 mg Oral Daily  . carvedilol  12.5 mg Oral BID WC  . chlordiazePOXIDE  50 mg Oral Once  . enoxaparin (LOVENOX) injection  90 mg Subcutaneous Q12H  . folic acid  1 mg Oral Daily  . insulin aspart  0-9 Units Subcutaneous Q4H  . isosorbide mononitrate  120 mg Oral Daily  . lactulose  20 g Oral BID  . lactulose  300 mL Rectal Daily  . LORazepam  0-4 mg Intravenous Q8H  . multivitamin with minerals  1 tablet Oral Daily  . thiamine  100 mg Oral Daily   Or  . thiamine  100 mg Intravenous Daily   Continuous  Infusions: . cefTRIAXone (ROCEPHIN)  IV 2 g (11/23/19 3790)  . doxycycline (VIBRAMYCIN) IV 100 mg (11/23/19 0700)     LOS: 2 days    Time spent: [place], MD [facility] To contact the attending provider between 7A-7P or the covering provider during after hours 7P-7A, please log into the web site [email or web address] and access using universal [place] password for that web site. If you do not have the password, please call the hospital operator.  11/23/2019, 12:50 PM

## 2019-11-24 ENCOUNTER — Inpatient Hospital Stay (HOSPITAL_COMMUNITY): Payer: Medicare Other

## 2019-11-24 DIAGNOSIS — I214 Non-ST elevation (NSTEMI) myocardial infarction: Secondary | ICD-10-CM | POA: Diagnosis not present

## 2019-11-24 DIAGNOSIS — F10231 Alcohol dependence with withdrawal delirium: Secondary | ICD-10-CM | POA: Diagnosis not present

## 2019-11-24 DIAGNOSIS — F10239 Alcohol dependence with withdrawal, unspecified: Secondary | ICD-10-CM

## 2019-11-24 DIAGNOSIS — I639 Cerebral infarction, unspecified: Secondary | ICD-10-CM

## 2019-11-24 LAB — COMPREHENSIVE METABOLIC PANEL
ALT: 14 U/L (ref 0–44)
AST: 20 U/L (ref 15–41)
Albumin: 2.9 g/dL — ABNORMAL LOW (ref 3.5–5.0)
Alkaline Phosphatase: 57 U/L (ref 38–126)
Anion gap: 11 (ref 5–15)
BUN: 10 mg/dL (ref 8–23)
CO2: 18 mmol/L — ABNORMAL LOW (ref 22–32)
Calcium: 9 mg/dL (ref 8.9–10.3)
Chloride: 112 mmol/L — ABNORMAL HIGH (ref 98–111)
Creatinine, Ser: 1.24 mg/dL — ABNORMAL HIGH (ref 0.44–1.00)
GFR calc non Af Amer: 45 mL/min — ABNORMAL LOW (ref >60)
Glucose, Bld: 111 mg/dL — ABNORMAL HIGH (ref 70–99)
Potassium: 3.9 mmol/L (ref 3.5–5.1)
Sodium: 141 mmol/L (ref 135–145)
Total Bilirubin: 0.3 mg/dL (ref 0.3–1.2)
Total Protein: 7.2 g/dL (ref 6.5–8.1)

## 2019-11-24 LAB — CBC WITH DIFFERENTIAL/PLATELET
Abs Immature Granulocytes: 0.07 10*3/uL (ref 0.00–0.07)
Basophils Absolute: 0.1 10*3/uL (ref 0.0–0.1)
Basophils Relative: 1 %
Eosinophils Absolute: 0 10*3/uL (ref 0.0–0.5)
Eosinophils Relative: 0 %
HCT: 33.2 % — ABNORMAL LOW (ref 36.0–46.0)
Hemoglobin: 10.7 g/dL — ABNORMAL LOW (ref 12.0–15.0)
Immature Granulocytes: 1 %
Lymphocytes Relative: 23 %
Lymphs Abs: 2.6 10*3/uL (ref 0.7–4.0)
MCH: 33.2 pg (ref 26.0–34.0)
MCHC: 32.2 g/dL (ref 30.0–36.0)
MCV: 103.1 fL — ABNORMAL HIGH (ref 80.0–100.0)
Monocytes Absolute: 1.3 10*3/uL — ABNORMAL HIGH (ref 0.1–1.0)
Monocytes Relative: 12 %
Neutro Abs: 7 10*3/uL (ref 1.7–7.7)
Neutrophils Relative %: 63 %
Platelets: 232 10*3/uL (ref 150–400)
RBC: 3.22 MIL/uL — ABNORMAL LOW (ref 3.87–5.11)
RDW: 13 % (ref 11.5–15.5)
WBC: 11 10*3/uL — ABNORMAL HIGH (ref 4.0–10.5)
nRBC: 0 % (ref 0.0–0.2)

## 2019-11-24 LAB — GLUCOSE, CAPILLARY
Glucose-Capillary: 106 mg/dL — ABNORMAL HIGH (ref 70–99)
Glucose-Capillary: 107 mg/dL — ABNORMAL HIGH (ref 70–99)
Glucose-Capillary: 107 mg/dL — ABNORMAL HIGH (ref 70–99)
Glucose-Capillary: 108 mg/dL — ABNORMAL HIGH (ref 70–99)
Glucose-Capillary: 110 mg/dL — ABNORMAL HIGH (ref 70–99)
Glucose-Capillary: 113 mg/dL — ABNORMAL HIGH (ref 70–99)

## 2019-11-24 MED ORDER — LORAZEPAM 1 MG PO TABS: 1.0000 mg | ORAL_TABLET | ORAL | Status: AC | PRN

## 2019-11-24 MED ORDER — ASPIRIN 325 MG PO TABS
325.0000 mg | ORAL_TABLET | Freq: Every day | ORAL | Status: DC
  Administered 2019-11-24 – 2019-11-30 (×7): 325 mg via ORAL
  Filled 2019-11-24 (×7): qty 1

## 2019-11-24 MED ORDER — THIAMINE HCL 100 MG/ML IJ SOLN
100.0000 mg | Freq: Every day | INTRAMUSCULAR | Status: DC
  Administered 2019-11-25 – 2019-11-29 (×5): 100 mg via INTRAVENOUS
  Filled 2019-11-24 (×6): qty 2

## 2019-11-24 MED ORDER — FOLIC ACID 5 MG/ML IJ SOLN
1.0000 mg | Freq: Every day | INTRAMUSCULAR | Status: DC
  Administered 2019-11-25 – 2019-11-29 (×5): 1 mg via INTRAVENOUS
  Filled 2019-11-24 (×7): qty 0.2

## 2019-11-24 MED ORDER — LORAZEPAM 2 MG/ML IJ SOLN
1.0000 mg | INTRAMUSCULAR | Status: AC | PRN
  Administered 2019-11-24: 4 mg via INTRAVENOUS
  Administered 2019-11-26: 2 mg via INTRAVENOUS
  Administered 2019-11-26: 4 mg via INTRAVENOUS
  Administered 2019-11-26: 3 mg via INTRAVENOUS
  Administered 2019-11-26 – 2019-11-27 (×4): 2 mg via INTRAVENOUS
  Filled 2019-11-24: qty 1
  Filled 2019-11-24 (×2): qty 2
  Filled 2019-11-24 (×3): qty 1
  Filled 2019-11-24: qty 2
  Filled 2019-11-24: qty 1
  Filled 2019-11-24: qty 2

## 2019-11-24 MED ORDER — ENOXAPARIN SODIUM 40 MG/0.4ML ~~LOC~~ SOLN
40.0000 mg | SUBCUTANEOUS | Status: DC
  Administered 2019-11-25 – 2019-11-29 (×5): 40 mg via SUBCUTANEOUS
  Filled 2019-11-24 (×6): qty 0.4

## 2019-11-24 MED ORDER — DEXMEDETOMIDINE HCL IN NACL 400 MCG/100ML IV SOLN
0.1000 ug/kg/h | INTRAVENOUS | Status: DC
  Administered 2019-11-24: 0.4 ug/kg/h via INTRAVENOUS
  Filled 2019-11-24 (×2): qty 100

## 2019-11-24 MED ORDER — LACTULOSE ENEMA
300.0000 mL | Freq: Two times a day (BID) | ORAL | Status: DC
  Administered 2019-11-24 – 2019-11-25 (×2): 300 mL via RECTAL
  Filled 2019-11-24 (×5): qty 300

## 2019-11-24 MED ORDER — CHLORHEXIDINE GLUCONATE CLOTH 2 % EX PADS
6.0000 | MEDICATED_PAD | Freq: Every day | CUTANEOUS | Status: DC
  Administered 2019-11-24 – 2019-11-30 (×6): 6 via TOPICAL

## 2019-11-24 MED ORDER — ADULT MULTIVITAMIN W/MINERALS CH
1.0000 | ORAL_TABLET | Freq: Every day | ORAL | Status: DC
  Administered 2019-11-26 – 2019-11-27 (×2): 1 via ORAL
  Filled 2019-11-24 (×3): qty 1

## 2019-11-24 NOTE — Progress Notes (Signed)
Progress Note  Patient Name: Sharon Norris Date of Encounter: 11/24/2019  [place] Cardiologist: No primary care provider on file. New  Subjective   Remains heavily sedated after agitation overnight (rec'd 27 mg lorazepam IV). UDS + cocaine.  Inpatient Medications    Scheduled Meds: . abacavir-dolutegravir-lamiVUDine  1 tablet Oral Daily  . aspirin EC  81 mg Oral Daily  . atorvastatin  80 mg Oral Daily  . carvedilol  12.5 mg Oral BID WC  . chlordiazePOXIDE  50 mg Oral Once  . enoxaparin (LOVENOX) injection  90 mg Subcutaneous Q12H  . folic acid  1 mg Oral Daily  . insulin aspart  0-9 Units Subcutaneous Q4H  . isosorbide mononitrate  120 mg Oral Daily  . lactulose  20 g Oral BID  . lactulose  300 mL Rectal Daily  . LORazepam  0-4 mg Intravenous Q8H  . multivitamin with minerals  1 tablet Oral Daily  . nicotine  14 mg Transdermal Daily  . thiamine  100 mg Oral Daily   Or  . thiamine  100 mg Intravenous Daily   Continuous Infusions: . cefTRIAXone (ROCEPHIN)  IV 2 g (11/24/19 0528)  . doxycycline (VIBRAMYCIN) IV 100 mg (11/24/19 0615)   PRN Meds: acetaminophen, hydrALAZINE, nicotine polacrilex   Vital Signs    Vitals:   11/24/19 0300 11/24/19 0400 11/24/19 0430 11/24/19 0700  BP: (!) 166/89  (!) 156/97 (!) 146/69  Pulse: 85 94 89 83  Resp: (!) 21  (!) 24 (!) 24  Temp: 97.8 F (36.6 C)     TempSrc: Axillary     SpO2: 100%  100% 100%  Weight:      Height:        Intake/Output Summary (Last 24 hours) at 11/24/2019 0900 Last data filed at 11/23/2019 1900 Gross per 24 hour  Intake --  Output 750 ml  Net -750 ml   Last 3 Weights 11/23/2019 11/22/2019 04/10/2019  Weight (lbs) 198 lb 13.7 oz 199 lb 1.2 oz 197 lb  Weight (kg) 90.2 kg 90.3 kg 89.359 kg      Telemetry    NSR/mild sinus tachycardia - Personally Reviewed  ECG    NSR, LVH w repol changes, QTc prolongation improved at 474 ms - Personally Reviewed  Physical Exam  Obese, sedated, but  arousable GEN: No acute distress.   Neck: No JVD Cardiac: RRR, no murmurs, rubs, or gallops.  Respiratory: Clear to auscultation bilaterally. GI: Soft, nontender, non-distended  MS: No edema; No deformity. Neuro:  Nonfocal  Psych: Normal affect   Labs    High Sensitivity Troponin:   Recent Labs  Lab 11/21/19 0253 11/21/19 0453 11/21/19 0736 11/21/19 1032 11/21/19 1430  TROPONINIHS 1,051* 1,493* 3,440* 4,942* 4,910*      Chemistry Recent Labs  Lab 11/21/19 0253 11/21/19 0253 11/21/19 0604 11/21/19 0604 11/22/19 0828 11/23/19 0104 11/24/19 0034  NA 144   < > 139   < > 139 140 141  K 3.8   < > 3.7   < > 3.7 4.1 3.9  CL 109   < > 109   < > 107 109 112*  CO2 18*   < > 18*   < > 19* 18* 18*  GLUCOSE 224*   < > 180*   < > 137* 115* 111*  BUN 39*   < > 37*   < > 18 13 10   CREATININE 2.28*   < > 2.04*   < > 1.49* 1.42* 1.24*  CALCIUM  9.1   < > 8.5*   < > 8.3* 8.7* 9.0  PROT 8.7*   < > 7.6  --   --  6.7 7.2  ALBUMIN 3.8   < > 3.5  --   --  2.9* 2.9*  AST 37   < > 36  --   --  28 20  ALT 23   < > 22  --   --  14 14  ALKPHOS 68   < > 63  --   --  57 57  BILITOT 0.7   < > 0.5  --   --  0.7 0.3  GFRNONAA 22*   < > 25*   < > 36* 38* 45*  GFRAA 25*  --  29*  --   --   --   --   ANIONGAP 17*   < > 12   < > 13 13 11    < > = values in this interval not displayed.     Hematology Recent Labs  Lab 11/22/19 0828 11/23/19 0104 11/24/19 0034  WBC 8.3 7.9 11.0*  RBC 3.09* 2.87* 3.22*  HGB 10.1* 9.6* 10.7*  HCT 32.0* 29.8* 33.2*  MCV 103.6* 103.8* 103.1*  MCH 32.7 33.4 33.2  MCHC 31.6 32.2 32.2  RDW 13.3 13.1 13.0  PLT 208 PLATELET CLUMPS NOTED ON SMEAR, COUNT APPEARS ADEQUATE 232    BNPNo results for input(s): BNP, PROBNP in the last 168 hours.   DDimer  Recent Labs  Lab 11/21/19 0403  DDIMER 0.96*     Radiology    DG CHEST PORT 1 VIEW  Result Date: 11/24/2019 CLINICAL DATA:  Pneumonia.  Confusion. EXAM: PORTABLE CHEST 1 VIEW COMPARISON:  11/21/2019 FINDINGS:  0627 hours. Low volumes with asymmetric elevation right hemidiaphragm, similar to prior. Streaky opacity at the left base compatible with atelectasis, not substantially changed. No edema or pleural effusion. Cardiopericardial silhouette is at upper limits of normal for size. The visualized bony structures of the thorax show no acute abnormality. Telemetry leads overlie the chest. IMPRESSION: Low volume film with chronic atelectasis or scarring at the left base. Electronically Signed   By: Misty Stanley M.D.   On: 11/24/2019 07:45   [place] EKG SITE RITE  Result Date: 11/22/2019 If Site Rite image not attached, placement could not be confirmed due to current cardiac rhythm.   Cardiac Studies   11/21/2019 ECHO  1. Mild intracavitary gradient. Peak velocity 1.78 m/s. Peak gradient  12.6 mmHg. Left ventricular ejection fraction, by estimation, is 60 to  65%. The left ventricle has normal function. The left ventricle has no  regional wall motion abnormalities. There  is mild concentric left ventricular hypertrophy. Left ventricular  diastolic parameters are consistent with Grade I diastolic dysfunction  (impaired relaxation). Elevated left ventricular end-diastolic pressure.  2. Right ventricular systolic function is normal. The right ventricular  size is normal. There is normal pulmonary artery systolic pressure.  3. The mitral valve is normal in structure. No evidence of mitral valve  regurgitation. No evidence of mitral stenosis.  4. The aortic valve is normal in structure. Aortic valve regurgitation is  not visualized. No aortic stenosis is present.  5. The inferior vena cava is normal in size with <50% respiratory  variability, suggesting right atrial pressure of 8 mmHg.    Patient Profile     64 y.o. female with a hx of HTN, CKD 3b-4, HIV (treated), alcohol abuse and remote IVDA, presenting with pleuritic chest pain and elevated  cardiac enzymes, recent cocaine use per  UDS  Assessment & Plan    1. Altered mental status: consistent with ETOH withdrawal. 2. Chest pain: sounded more pleuritic than anginal, but hs troponin >4000. Consistent with cocaine related ischemic injury +/- myocarditis. 3. HTN: parallels level of agitation 4. CKD 3b: creatinine back to baseline. 5. HLP: worsened lipid profile possibly due to protease inh, started on high dose atorvastatin. Has prediabetes. 6. Long QTc:  Improved. Hypocalcemia +/- ischemia.  [facility] will sign off.   Medication Recommendations:  Can change enoxaparin to prophylactic dose. ASA 81 mg daily and atorvastatin 80 mg daily recommended. Avoid nonselective beta blockers, carvedilol benefits may outweigh its risks. Other recommendations (labs, testing, etc):  No plans for additional inpatient evaluation. Consider outpatient coronary CT angio, but little benefit expected while polysubstance abuse continues Follow up as an outpatient:  Please schedule non-urgent follow-up at DC.  For questions or updates, please contact [place] Please consult [email or web address] for contact info under        Signed, Sanda Klein, MD  11/24/2019, 9:00 AM

## 2019-11-24 NOTE — Care Management Important Message (Signed)
Important Message  Patient Details  Name: Sharon Norris MRN: [id number] Date of Birth: 1955-06-30   Medicare Important Message Given:  Yes     Jillian Warth 11/24/2019, 1:40 PM

## 2019-11-24 NOTE — Consult Note (Addendum)
Neurology Consultation  Reason for Consult: Cerebellar stroke  Referring Physician: Dr. Verlon Au  CC: Left sided weakness increased relative to baseline   History is obtained from: Chart  HPI: Sharon Norris is a 64 y.o. female with past medical history of neuropathy, hypertension, anxiety and chronic kidney disease stage IV who presented to the emergency department with chest pain.  While in the hospital daughter had noted that her left side was weaker than normal thus MRI was obtained. Although significantly motion degraded, the MRI did show a focus of restricted diffusion within the left cerebellar hemisphere consistent with an acute/subacute infarct. For this reason Neurology was consulted.  At the time of Neurology consultation, the patient is extremely agitated and requiring four-point restraints.  She is yelling profanities at staff.    Of note, she also has a history of drug abuse and has claimed to have been drug-free for 12 years, but was cocaine positive on toxicology screen yesterday (10/7). Family also endorsed ongoing heavy EtOH abuse. The patient was initially conversant this admission, then became progressively more agitated. Tremor was noted by nursing staff initially. She had been requesting that family bring in beer for her to drink, consistent with craving. She has required > 40 mg total of Ativan over the past 24 hours for her increasing agitation. ICU team is consulting and is considering Precedex for probable DTs.   LKW: Unknown tpa given?: no, unknown last seen normal Premorbid modified Rankin scale (mRS): 0 Unable to obtain NIH stroke scale secondary to patient's agitation.   Past Medical History:  Diagnosis Date  . Anxiety   . Bronchitis   . Depression   . Excessive cerumen in both ear canals 07/02/2017  . H/O drug abuse ([place])    IV drugs, drug free 12 years  . Hepatitis    Hep. C  . History of suicidal ideation    thought about overdose in the past  . HIV  infection ([place])   . Hypertension   . Neuropathy   . Sleep difficulties   . Trichomonal infection    2009    Family History  Problem Relation Age of Onset  . Alcohol abuse Father   . Cancer Maternal Aunt        endometrial  . Cancer Sister        endometrial  . Cancer Sister        breast   Social History:   reports that she quit smoking about 19 years ago. She has never used smokeless tobacco. She reports current alcohol use. She reports that she does not use drugs.  Home medications: No current facility-administered medications on file prior to encounter.   Current Outpatient Medications on File Prior to Encounter  Medication Sig Dispense Refill  . amLODipine (NORVASC) 5 MG tablet TAKE ONE TABLET BY MOUTH DAILY (Patient taking differently: Take 5 mg by mouth daily. ) 90 tablet 0  . divalproex (DEPAKOTE ER) 500 MG 24 hr tablet TAKE ONE TABLET BY MOUTH DAILY (Patient taking differently: Take 500 mg by mouth daily. ) 90 tablet 1  . gabapentin (NEURONTIN) 300 MG capsule TAKE TWO CAPSULES BY MOUTH TWICE A DAY (Patient taking differently: Take 300 mg by mouth 2 (two) times daily. TAKE TWO CAPSULES BY MOUTH TWICE A DAY) 180 capsule 0  . lisinopril-hydrochlorothiazide (ZESTORETIC) 20-25 MG tablet TAKE ONE TABLET BY MOUTH DAILY (Patient taking differently: Take 1 tablet by mouth daily. ) 90 tablet 0  . Multiple Vitamin (MULTIVITAMIN WITH MINERALS) TABS  tablet Take 1 tablet by mouth daily.    . TRIUMEQ 600-50-300 MG tablet TAKE ONE TABLET BY MOUTH DAILY (Patient taking differently: Take 1 tablet by mouth daily. ) 30 tablet 4  . aluminum sulfate-calcium acetate (DOMEBORO) packet Apply 1 packet topically 3 (three) times daily. (Patient not taking: Reported on 04/30/2019) 100 each 12     Medications  Current Facility-Administered Medications:  .  abacavir-dolutegravir-lamiVUDine (TRIUMEQ) [id number] MG per tablet 1 tablet, 1 tablet, Oral, Daily, Opyd, Ilene Qua, MD, 1 tablet at 11/23/19  1307 .  acetaminophen (TYLENOL) tablet 650 mg, 650 mg, Oral, Q4H PRN, Opyd, Timothy S, MD .  aspirin EC tablet 81 mg, 81 mg, Oral, Daily, Opyd, Timothy S, MD .  atorvastatin (LIPITOR) tablet 80 mg, 80 mg, Oral, Daily, Croitoru, Mihai, MD, 80 mg at 11/23/19 1307 .  carvedilol (COREG) tablet 12.5 mg, 12.5 mg, Oral, BID WC, Minus Breeding, MD, 12.5 mg at 11/23/19 1648 .  cefTRIAXone (ROCEPHIN) 2 g in sodium chloride 0.9 % 100 mL IVPB, 2 g, Intravenous, Q24H, Opyd, Ilene Qua, MD, Last Rate: 200 mL/hr at 11/24/19 0528, 2 g at 11/24/19 0528 .  chlordiazePOXIDE (LIBRIUM) capsule 50 mg, 50 mg, Oral, Once, Samtani, Jai-Gurmukh, MD .  doxycycline (VIBRAMYCIN) 100 mg in sodium chloride 0.9 % 250 mL IVPB, 100 mg, Intravenous, Q12H, Opyd, Ilene Qua, MD, Last Rate: 125 mL/hr at 11/24/19 0615, 100 mg at 11/24/19 0615 .  enoxaparin (LOVENOX) injection 90 mg, 90 mg, Subcutaneous, Q12H, Carney, Gay Filler, RPH, 90 mg at 11/24/19 0528 .  folic acid (FOLVITE) tablet 1 mg, 1 mg, Oral, Daily, Opyd, Ilene Qua, MD, 1 mg at 11/23/19 1308 .  hydrALAZINE (APRESOLINE) injection 10 mg, 10 mg, Intravenous, Q6H PRN, Shalhoub, Sherryll Burger, MD .  insulin aspart (novoLOG) injection 0-9 Units, 0-9 Units, Subcutaneous, Q4H, Opyd, Ilene Qua, MD, 1 Units at 11/22/19 2032 .  isosorbide mononitrate (IMDUR) 24 hr tablet 120 mg, 120 mg, Oral, Daily, Minus Breeding, MD, 120 mg at 11/23/19 1306 .  lactulose (CHRONULAC) 10 GM/15ML solution 20 g, 20 g, Oral, BID, Verlon Au, Jai-Gurmukh, MD, 20 g at 11/23/19 2116 .  lactulose (CHRONULAC) enema 200 gm, 300 mL, Rectal, Daily, Samtani, Jai-Gurmukh, MD .  [EXPIRED] LORazepam (ATIVAN) injection 0-4 mg, 0-4 mg, Intravenous, Q4H, 2 mg at 11/22/19 0906 **FOLLOWED BY** LORazepam (ATIVAN) injection 0-4 mg, 0-4 mg, Intravenous, Q8H, Opyd, Ilene Qua, MD, 4 mg at 11/24/19 0849 .  multivitamin with minerals tablet 1 tablet, 1 tablet, Oral, Daily, Opyd, Ilene Qua, MD, 1 tablet at 11/22/19 0901 .  nicotine  (NICODERM CQ - dosed in mg/24 hours) patch 14 mg, 14 mg, Transdermal, Daily, Samtani, Jai-Gurmukh, MD .  nicotine polacrilex (NICORETTE) gum 2 mg, 2 mg, Oral, PRN, Nita Sells, MD .  thiamine tablet 100 mg, 100 mg, Oral, Daily, 100 mg at 11/23/19 1322 **OR** thiamine (B-1) injection 100 mg, 100 mg, Intravenous, Daily, Opyd, Timothy S, MD  ROS:  Unable to obtain due to altered mental status.   Exam: Current vital signs: BP 134/76 (BP Location: Right Arm)   Pulse 83   Temp 97.8 F (36.6 C) (Axillary)   Resp (!) 23   Ht 5\' 3"  (1.6 m)   Wt 90.2 kg   SpO2 100%   BMI 35.23 kg/m  Vital signs in last 24 hours: Temp:  [97.8 F (36.6 C)-98.3 F (36.8 C)] 97.8 F (36.6 C) (10/08 1111) Pulse Rate:  [83-94] 83 (10/08 0700) Resp:  [21-24] 23 (10/08 1111) BP: (134-173)/(69-97)  134/76 (10/08 1111) SpO2:  [99 %-100 %] 100 % (10/08 0700)   Constitutional: Appears well-developed and well-nourished.  Psych: Affect appropriate to situation Eyes: No scleral injection HENT: No OP obstrucion Head: Normocephalic.  Cardiovascular: Normal rate and regular rhythm.  Respiratory: Effort normal, non-labored breathing GI: Soft.  No distension. There is no tenderness.  Skin: WDI  Neuro: Mental Status: Patient is awake, screaming profanities, attempting to punch staff, following no verbal commands. Cranial Nerves: II: Blinks to threat. Pupils equal on limited examination.  III,IV, VI: EOMI without ptosis or diplopia. Pupils equal, round and reactive to light V: Not cooperative VII: Facial movement is symmetric with no facial droop noted.  Motor: By observation, when staff is attempting to clean her she has 5/5 strength throughout to the point where 5 people are needed to hold her down. She does not cooperate with motor exam.  Sensory: Sensation is symmetric to touch Deep Tendon Reflexes: 2+ and symmetric in the biceps and patellae.  Plantars: Toes are downgoing bilaterally.  Cerebellar:  Unable to obtain  Labs I have reviewed labs in epic and the results pertinent to this consultation are:   CBC    Component Value Date/Time   WBC 11.0 (H) 11/24/2019 0034   RBC 3.22 (L) 11/24/2019 0034   HGB 10.7 (L) 11/24/2019 0034   HGB 11.9 02/19/2015 1423   HCT 33.2 (L) 11/24/2019 0034   HCT 35.2 02/19/2015 1423   PLT 232 11/24/2019 0034   PLT 239 02/19/2015 1423   MCV 103.1 (H) 11/24/2019 0034   MCV 98 (H) 02/19/2015 1423   MCH 33.2 11/24/2019 0034   MCHC 32.2 11/24/2019 0034   RDW 13.0 11/24/2019 0034   RDW 15.1 02/19/2015 1423   LYMPHSABS 2.6 11/24/2019 0034   LYMPHSABS 1.9 02/19/2015 1423   MONOABS 1.3 (H) 11/24/2019 0034   EOSABS 0.0 11/24/2019 0034   EOSABS 0.1 02/19/2015 1423   BASOSABS 0.1 11/24/2019 0034   BASOSABS 0.0 02/19/2015 1423    CMP     Component Value Date/Time   NA 141 11/24/2019 0034   NA 140 08/24/2018 1551   K 3.9 11/24/2019 0034   CL 112 (H) 11/24/2019 0034   CO2 18 (L) 11/24/2019 0034   GLUCOSE 111 (H) 11/24/2019 0034   BUN 10 11/24/2019 0034   BUN 33 (H) 08/24/2018 1551   CREATININE 1.24 (H) 11/24/2019 0034   CREATININE 1.67 (H) 04/10/2019 1204   CALCIUM 9.0 11/24/2019 0034   PROT 7.2 11/24/2019 0034   PROT 8.2 06/04/2015 1529   ALBUMIN 2.9 (L) 11/24/2019 0034   ALBUMIN 4.4 06/04/2015 1529   AST 20 11/24/2019 0034   ALT 14 11/24/2019 0034   ALKPHOS 57 11/24/2019 0034   BILITOT 0.3 11/24/2019 0034   BILITOT <0.2 06/04/2015 1529   GFRNONAA 45 (L) 11/24/2019 0034   GFRNONAA 38 (L) 06/29/2017 1003   GFRAA 29 (L) 11/21/2019 0604   GFRAA 44 (L) 06/29/2017 1003    Lipid Panel     Component Value Date/Time   CHOL 239 (H) 03/04/2017 1000   CHOL 265 (H) 02/19/2015 1423   TRIG 174 (H) 03/04/2017 1000   HDL 62 03/04/2017 1000   HDL 58 02/19/2015 1423   CHOLHDL 3.9 03/04/2017 1000   VLDL 36 (H) 04/08/2016 1430   LDLCALC 146 (H) 03/04/2017 1000     Imaging I have reviewed the images obtained:  CT-scan of the brain-on  11/21/2019 showed no acute findings  MRI examination of the brain-11/24/2019 showed a  restricted diffusion focus within the left cerebellar hemisphere consistent with acute/subacute infarct.  Echo obtained on 11/21/2019 showed EF of 60 to 65% with no atrial level shunt detected.   Etta Quill PA-C [facility] [phone] 11/24/2019, 1:27 PM   TTE (10/5): 1. Mild intracavitary gradient. Peak velocity 1.78 m/s. Peak gradient  12.6 mmHg. Left ventricular ejection fraction, by estimation, is 60 to  65%. The left ventricle has normal function. The left ventricle has no  regional wall motion abnormalities. There  is mild concentric left ventricular hypertrophy. Left ventricular  diastolic parameters are consistent with Grade I diastolic dysfunction  (impaired relaxation). Elevated left ventricular end-diastolic pressure.  2. Right ventricular systolic function is normal. The right ventricular  size is normal. There is normal pulmonary artery systolic pressure.  3. The mitral valve is normal in structure. No evidence of mitral valve  regurgitation. No evidence of mitral stenosis.  4. The aortic valve is normal in structure. Aortic valve regurgitation is  not visualized. No aortic stenosis is present.  5. The inferior vena cava is normal in size with <50% respiratory  variability, suggesting right atrial pressure of 8 mmHg.   Assessment:  64 y.o. female with past medical history of neuropathy, hypertension, anxiety and CKD stage IV who presented to the emergency department with chest pain.  While in the hospital daughter had noted that her left side was weaker than normal thus MRI was obtained which revealed a small focus of restricted diffusion within the left cerebellar hemisphere, consistent with an acute/subacute infarction. Her second Neurological problem is delirium. The patient was initially conversant this admission, then became progressively more agitated. Tremor was noted  by nursing staff initially. She had been requesting that family bring in beer for her to drink, consistent with craving. She has required > 40 mg total of Ativan over the past 24 hours for her increasing agitation. ICU team is consulting and is considering Precedex for probable DTs.  -- At the time of Neurology consultation, the patient is extremely agitated and requiring four-point restraints.  She is yelling profanities at staff. Limited exam reveals agitation, altered mental status, no facial droop, no eye deviation and symmetric motor function in her upper and lower extremities.  -- Stroke may be secondary to vasospasm in the setting of HTN -- Delirium is most likely secondary to acute EtOH withdrawal.    Impression: -Left cerebellar acute/subacute infarct --Probable delirium tremens  Recommendations: -MRA Head and neck if patient can tolerate -Agree with starting ASA 81 mg po qd. -Consider holding atorvastatin for now, given her heavy EtOH use and risk for liver damage as a side effect. -BP goal: Systolic less than 076 --Carotid ultrasound -HBAIC and Lipid profile -Telemetry monitoring -Frequent neuro checks -NPO until passes stroke swallow screen -PT/OT -Scheduled benzodiazepine or Precedex for probable delirium tremens. Will also need alcohol cessation counseling.   I have seen and examined the patient. I have formulated the assessment and recommendations. 64 year old female initially presenting with CP. Has had increased agitation and worsening mentation this admission as well as history and other symptoms most consistent with EtOH withdrawal. Also was complaining of weakness, with MRI brain revealing an acute small left cerebellar ischemic infarction. Recommendations as above.  Electronically signed: Dr. Kerney Elbe

## 2019-11-24 NOTE — Progress Notes (Signed)
Patient lethargic and only waking to pain. Day shift RN agreed with clinical status and precedex gtt working. Precedex titrated down  Pt had multiple runs of bradycardia into the 30's and once with a large pause, alarming asystole. Patient reactive to stimulation. Precedex gtt turned off and Elink aware

## 2019-11-24 NOTE — Progress Notes (Signed)
eLink Physician-Brief Progress Note Patient Name: Sharon Norris DOB: 01-13-1956 MRN: [id number]   Date of Service  11/24/2019  HPI/Events of Note  Patient with bradycardia into the 30's and a pause with Precedex at doses of 3-6 mcg, however she has had no spells with Precedex at 1 mcg and needs the Precedex for marked agitated delirium.  eICU Interventions  Will change the Precedex order to a fixed dose at 1 mcg.        Sharon Norris 11/24/2019, 10:25 PM

## 2019-11-24 NOTE — Progress Notes (Signed)
PROGRESS NOTE    Sharon Norris  JQZ:[id number] DOB: 09-02-1955 DOA: 11/21/2019 PCP: [name], MD followed by internal medicine teaching service  Brief Narrative:  64 WF known history of HIV followed by infectious disease Dr. Johnnye Sima Prior hep C Prior drug abuse Prior admission 05/25/2013-05/27/2013 noncardiac chest pain Seen for video visit 11/01/2019 and was doing fair at this time Admitted 11/21/2019 with left-sided numbness pins-and-needles burning in chest-EMS noted patient extremely anxious-EKG sinus tach rate 120 CXR atelectasis?  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 on MRI 10/8 Because of agitation and delirium difficult to control probably related to multifactorial reasons in addition to hepatitis possible cirrhosis and illicit drug use critical care was consulted to evaluate for need for Precedex on 10//8  Assessment & Plan:   Principal Problem:   NSTEMI (non-ST elevated myocardial infarction) ([place]) Active Problems:   Human immunodeficiency virus (HIV) disease ([place])   Essential hypertension   CKD (chronic kidney disease), stage IV ([facility])   SIRS (systemic inflammatory response syndrome) ([facility])   Numbness on left side   Prolonged QT interval   Alcohol withdrawal ([facility])   Hyperglycemia  1. Toxic metabolic encephalopathy with worsening CIWA scales 2. Hep C?  Encephalopathy related to this versus alcohol or other illicits a. Urine drug screen positive for cocaine b. CIWA scores are mid 20s close to 30 and she is not redirectable and needed about 28 mg of IV Ativan overnight c. Critical care consulted to assist with management d. May need scheduled lactulose enemas which have  been ordered 3. NSTEMI versus myocarditis?  Other causes a. Earlier had poor IV access therefore did change heparin to Lovenox b. Echo shows HFpEF but no WM anomaly-cardiology feels does not need further heparin and that this can be explained by cocaine use therefore have signed off c. Appreciate input 4. ?  New stroke cerebellar region a. Neurology is recommending MRA head start aspirin 325 atorvastatin 80 and further work-up b. It is difficult to get her to take meds we will attempt the same 5. ?  Pneumonia on admission a. Continue ceftriaxone IV doxycycline IV until can reliably take p.o. and or until 10/8 at stop (this would be 5 days) 6. HTN a. Patient refusing some meds so may need to transition back to IV from oral meds 7. Acute superimposed on chronic CKD 3 a. Peak 39/2.2 and is improving b. Try to avoid nephrotoxins 8. Prolonged QTC 9. HIV a. Continue triumeq 1 tablet daily-needs outpatient follow-up with infectious disease 10. A1c 6.1 with obesity 11. BMI 35 a. Outpatient characterization follow  DVT prophylaxis:  Code Status:  Family Communication: Disposition:   Status is: Inpatient  Remains inpatient appropriate because:Hemodynamically unstable, Altered mental status, Ongoing diagnostic testing needed not appropriate for outpatient work up and IV treatments appropriate due to intensity of illness or inability to take PO   Dispo: The patient is from: Home              Anticipated d/c is to: SNF              Anticipated d/c date is: 2 days              Patient currently is not medically stable to d/c.  Consultants:   Cardiology  Procedures: Duplex lower extremities 10/5 Echo 10/5  Antimicrobials: Ceftriaxone doxycycline   Subjective:  Received multiple rounds of Ativan nurse tells me about 28 mg I evaluated her this morning and she was still asleep after receiving this much-I came back in the afternoon when I was told she was being belligerent but began  to seem to be sleepy Not very responsive verbally to me although does arouse slightly remains in restraints CIWA scale is about 28 It is difficult to get any history out of her She is not redirectable per nursing and was kicking and screaming earlier today  Objective: Vitals:   11/24/19 0400 11/24/19 0430 11/24/19 0700 11/24/19 1111  BP:  (!) 156/97 (!) 146/69 134/76  Pulse: 94 89 83   Resp:  (!) 24 (!) 24 (!) 23  Temp:    97.8 F (36.6 C)  TempSrc:    Axillary  SpO2:  100% 100%   Weight:      Height:        Intake/Output Summary (Last 24 hours) at 11/24/2019 1425 Last data filed at 11/23/2019 1900 Gross per 24 hour  Intake --  Output 750 ml  Net -750 ml   Filed Weights   11/22/19 0417 11/23/19 0500  Weight: 90.3 kg 90.2 kg    Examination:  General exam: Right now is sleepy Respiratory system: Chest clear no rales rhonchi Cardiovascular system: s1 s2no m/r/g sinus rhythm and PVCs on monitor Gastrointestinal system: soft nd nt no rebound. Central nervous system: Quite confused-cannot assess mentation difficult to test power given she is in restraints and not cooperative with exam  Data Reviewed: I have personally reviewed following labs and imaging studies BUN/Creat 37/2.0--->18/1.49--> 10/1.2 LFTs normal WBC 11.0 Hemoglobin 10.7   Radiology Studies: MR BRAIN WO CONTRAST  Result Date: 11/24/2019 CLINICAL DATA:  Stroke, follow-up. EXAM: MRI HEAD WITHOUT CONTRAST TECHNIQUE: Multiplanar, multiecho pulse sequences of the brain and surrounding structures were obtained without intravenous contrast. COMPARISON:  Head CT November 21, 2019. FINDINGS: The study is severely degraded by motion, most images are nondiagnostic. Brain: Focus of restricted diffusion within the left cerebellar hemisphere consistent with acute/subacute. There is hyperintensity the diffusion weighted sequence diffusely the right cerebellar hemisphere cortex and right temporoparietal and occipital cortex with  no corresponding hyperintensity on the T2 sequence, may be artifactual. Hazy T2 hyperintensity within the white matter of the cerebral hemispheres, nonspecific. No large hemorrhage hydrocephalus, large extra-axial collection mass lesion. Vascular: Normal flow voids. Skull and upper cervical spine: Grossly unremarkable. Sinuses/Orbits: Grossly unremarkable. Other: None. IMPRESSION: 1. Severely motion degraded study, most images are nondiagnostic. 2. Focus of restricted diffusion within the left cerebellar hemisphere consistent with acute/subacute infarct. 3. Hyperintensity on diffusion weighted sequence diffusely in the right cerebellar hemisphere cortex and right temporoparietal and occipital cortex, likely artifactual. These results will be called to the ordering clinician or representative by the Radiologist Assistant, and communication documented in the PACS or [employer]. Electronically Signed   By: Pedro Earls M.D.   On: 11/24/2019 10:27   DG CHEST PORT 1 VIEW  Result Date: 11/24/2019 CLINICAL DATA:  Pneumonia.  Confusion. EXAM: PORTABLE CHEST 1 VIEW COMPARISON:  11/21/2019 FINDINGS: 0627 hours. Low volumes with asymmetric elevation right hemidiaphragm, similar to prior. Streaky opacity at the left base compatible with atelectasis, not substantially changed. No edema or pleural effusion. Cardiopericardial silhouette is at upper limits of normal for size. The visualized bony structures of the thorax show no acute  abnormality. Telemetry leads overlie the chest. IMPRESSION: Low volume film with chronic atelectasis or scarring at the left base. Electronically Signed   By: Misty Stanley M.D.   On: 11/24/2019 07:45     Scheduled Meds:  abacavir-dolutegravir-lamiVUDine  1 tablet Oral Daily   aspirin EC  81 mg Oral Daily   atorvastatin  80 mg Oral Daily   carvedilol  12.5 mg Oral BID WC   chlordiazePOXIDE  50 mg Oral Once   enoxaparin (LOVENOX) injection  90 mg Subcutaneous  T21C   folic acid  1 mg Oral Daily   insulin aspart  0-9 Units Subcutaneous Q4H   isosorbide mononitrate  120 mg Oral Daily   lactulose  20 g Oral BID   lactulose  300 mL Rectal Daily   LORazepam  0-4 mg Intravenous Q8H   multivitamin with minerals  1 tablet Oral Daily   nicotine  14 mg Transdermal Daily   thiamine  100 mg Oral Daily   Or   thiamine  100 mg Intravenous Daily   Continuous Infusions:  cefTRIAXone (ROCEPHIN)  IV 2 g (11/24/19 0528)   doxycycline (VIBRAMYCIN) IV 100 mg (11/24/19 0615)     LOS: 3 days    Time spent: [place], MD [facility] To contact the attending provider between 7A-7P or the covering provider during after hours 7P-7A, please log into the web site [email or web address] and access using universal [place] password for that web site. If you do not have the password, please call the hospital operator.  11/24/2019, 2:25 PM

## 2019-11-24 NOTE — Consult Note (Signed)
NAME:  Sharon Norris, MRN:  [id number], DOB:  12-Mar-1955, LOS: 3 ADMISSION DATE:  11/21/2019, CONSULTATION DATE:  11/24/19 REFERRING MD:  Dr. Verlon Au, CHIEF COMPLAINT:  AMS  Brief History   Sharon Norris is a 64 y/o female with a PMHx of HTN, HIV, prior Hep C, polysubstance abuse who presented to the ED with c/o chest pain and admitted for NSTEMI, acute toxic encephalopathy, acute infarct, CAP, AoCKD and prolonged QTc. NSTEMI thought to be 2/2 cocaine use and encephalopathy 2/2 alcohol withdrawal. Despite 45mg  Ativan over last 24 hours, patient continues to be altered. PCCM consulted for precedex gtt.  History of present illness   Sharon Norris is a 64 y.o. female with medical history significant for HIV, alcohol abuse, hypertension, remote history of IV drug abuse, and chronic kidney disease stage IIIb-IV, presenting to the emergency department for evaluation of chest pain.  Patient reports that she developed a burning sensation involving her left side around 6 PM yesterday.  The discomfort intensified and became more localized to the left chest.  Burning chest pain has now been constant and without any alleviating or exacerbating factors.  Patient also reports shortness of breath and productive cough.  She reports occasional leg swelling but is unsure if they are swollen now.  She denies abdominal pain. In ED, found to have NSTEMI, acute encephalopathy, and possible pneumonia, subsequently admitted to [facility]. Cardiology following, contributing NSTEMI to cocaine use (UDS+). During hospitalization, found to have left cerebellar hemisphere acute/subacute infarct, neurology consulted.  Patient has also increasingly become agitated during her stay. Per RN, patient unwilling to take oral medications, swearing at staff, and hallucinating. CIWA's 20-30, requiring 45mg  Ativan over the last 24 hours.   Past Medical History  HTN, HIV, CKD stage 4, prior hep C, polysubstance abuse  Significant Hospital Events    10/5 >> Admitted to [facility] 10/8 >> PCCM consulted for agitation resistant to benzodiazepines  Consults:  Cardiology, Neurology, PCCM  Procedures:  N/A  Significant Diagnostic Tests:  CXR 10/5 >> Left lung base with atelectasis versus infectiously etiology.  CT Head 10/5 >> Negative for acute intracranial abnormality TTE 10/5 >> EF 93-23%, grade 1 diastolic dysfunction BLE u/s 10/5 >> No evidence of DVT bilaterally MRI Head 10/8 >> Restricted diffusion within left cerebellar hemisphere consistent w/ acute/subacute infarct CXR 10/8 >> Chronic atelectasis, stable with last CXR  Micro Data:  MRSA > neg COVID-19 > neg Influenza A/B > neg  Antimicrobials:  Rocephin 10/5 -  Doxycycline 10/5 -   Interim history/subjective:  Sharon Norris persistently asks for help during out interaction. She is oriented to city and self. She is endorsing seeing the sky with stars. Patient states her arms are bothering her the most right now. She also endorses chest pain, back pain. She cannot identify where her chest.   Objective   Blood pressure 134/76, pulse 84, temperature 97.8 F (36.6 C), temperature source Axillary, resp. rate (!) 23, height 5\' 3"  (1.6 m), weight 90.2 kg, SpO2 100 %.        Intake/Output Summary (Last 24 hours) at 11/24/2019 1558 Last data filed at 11/23/2019 1900 Gross per 24 hour  Intake --  Output 750 ml  Net -750 ml   Filed Weights   11/22/19 0417 11/23/19 0500  Weight: 90.3 kg 90.2 kg    Examination: General: Tearful, ill-appearing HENT: Normocephalic, atraumatic Lungs: Clear to auscultation bilaterally Cardiovascular: Tachycardic, regular rhythm. No m/r/g Abdomen: Soft, tender to palpation throughout, non-distended Extremities: No edema bilaterally  Neuro: Awake, alert to self only. Psych: Anxious, crying, difficult to re-direct  Resolved Hospital Problem list     Assessment & Plan:  #Acute metabolic encephalopathy d/t alcohol withdrawal -Per chart review,  patient agitated, tremulous, and tachycardic on arrival -Acute worsening of agitation over past 48 hours, including refusing oral medications, swearing at staff, and hallucinating requiring four-point restraints -Received 45mg  Ativan over last 24 hours, continues to be encephalopathic -Start precedex gtt -Re-start thiamine -Continue restraints as needed  #Acute/subacute left cerebellar hemisphere infarct -Per daughter, patient had difficulty using left hand -Exam limited 2/2 AMS, but no weakness noted -BP control w/ SBP goal <140 -C/w ASA 325, Atorvastatin 80 -Frequent neuro checks -F/u A1c, lipids  #CAP -Patient afebrile during hospitalization, although endorsing SOB, productive cough -C/w Rocephin, doxycycline (day 3/5)  #NSTEMI -Markedly elevated troponins on arrival -TTE 10/5 w/ EF 60-65%, no WMA -Likely 2/2 cocaine (UDS+) -s/p heparin gtt -Tele  #Acute on chronic stage III kidney disease -Kidney function improving, Cr 1.24 < 1.42 -Avoid nephrotoxins  #HTN -Patient was refusing oral antihypertensives, BP improved w/ IV hydralazine -SBP goal <140 -C/w po coreg, imdur  #Macrocytic anemia -Most likely vitamin deficiency 2/2 alcohol use -Check B12, folate  #HIV -Last CD4+ 410 2019 -Re-checking CD4+ in setting of acute encephalopathy -C/w Triumeq 1 qd  #Pre-diabetes -A1c 6.1 on admission -C/w SSI -CBG monitoring  Best practice:  Diet: NPO except w/ meds DVT prophylaxis: Lovenox GI prophylaxis: n/a Glucose control: SSI Mobility: bed Code Status: Full Family Communication: Daughter primary contact Disposition:   Labs   CBC: Recent Labs  Lab 11/21/19 0253 11/21/19 0604 11/22/19 0828 11/23/19 0104 11/24/19 0034  WBC 15.5* 14.1* 8.3 7.9 11.0*  NEUTROABS 13.0*  --   --  5.2 7.0  HGB 11.4* 10.3* 10.1* 9.6* 10.7*  HCT 34.8* 32.8* 32.0* 29.8* 33.2*  MCV 102.1* 104.5* 103.6* 103.8* 103.1*  PLT 238 223 208 PLATELET CLUMPS NOTED ON SMEAR, COUNT APPEARS  ADEQUATE 662    Basic Metabolic Panel: Recent Labs  Lab 11/21/19 0253 11/21/19 0604 11/22/19 0828 11/23/19 0104 11/24/19 0034  NA 144 139 139 140 141  K 3.8 3.7 3.7 4.1 3.9  CL 109 109 107 109 112*  CO2 18* 18* 19* 18* 18*  GLUCOSE 224* 180* 137* 115* 111*  BUN 39* 37* 18 13 10   CREATININE 2.28* 2.04* 1.49* 1.42* 1.24*  CALCIUM 9.1 8.5* 8.3* 8.7* 9.0  MG  --  2.2  --   --   --   PHOS  --  2.8  --   --   --    GFR: Estimated Creatinine Clearance: 47.6 mL/min (A) (by C-G formula based on SCr of 1.24 mg/dL (H)). Recent Labs  Lab 11/21/19 0604 11/22/19 0828 11/23/19 0104 11/24/19 0034  PROCALCITON <0.10  --   --   --   WBC 14.1* 8.3 7.9 11.0*    Liver Function Tests: Recent Labs  Lab 11/21/19 0253 11/21/19 0604 11/23/19 0104 11/24/19 0034  AST 37 36 28 20  ALT 23 22 14 14   ALKPHOS 68 63 57 57  BILITOT 0.7 0.5 0.7 0.3  PROT 8.7* 7.6 6.7 7.2  ALBUMIN 3.8 3.5 2.9* 2.9*   No results for input(s): LIPASE, AMYLASE in the last 168 hours. Recent Labs  Lab 11/23/19 0104  AMMONIA 46*    ABG    Component Value Date/Time   PHART 7.651 (HH) 12/31/2006 1807   PCO2ART 14.3 (LL) 12/31/2006 1807   HCO3 15.8 (L) 12/31/2006 1807  TCO2 16 12/31/2006 1807   ACIDBASEDEF 5.0 (H) 12/31/2006 1807     Coagulation Profile: Recent Labs  Lab 11/21/19 0403  INR 1.0    Cardiac Enzymes: No results for input(s): CKTOTAL, CKMB, CKMBINDEX, TROPONINI in the last 168 hours.  HbA1C: Hgb A1c MFr Bld  Date/Time Value Ref Range Status  11/21/2019 06:04 AM 6.2 (H) 4.8 - 5.6 % Final    Comment:    (NOTE) Pre diabetes:          5.7%-6.4%  Diabetes:              >6.4%  Glycemic control for   <7.0% adults with diabetes     CBG: Recent Labs  Lab 11/23/19 1622 11/23/19 1923 11/24/19 0002 11/24/19 0411 11/24/19 1114  GLUCAP 108* 111* 110* 106* 107*    Review of Systems:   Unable to assess 2/2 AMS  Past Medical History  She,  has a past medical history of Anxiety,  Bronchitis, Depression, Excessive cerumen in both ear canals (07/02/2017), H/O drug abuse ([place]), Hepatitis, History of suicidal ideation, HIV infection ([place]), Hypertension, Neuropathy, Sleep difficulties, and Trichomonal infection.   Surgical History    Past Surgical History:  Procedure Laterality Date   COLPOSCOPY  2010     Social History   reports that she quit smoking about 19 years ago. She has never used smokeless tobacco. She reports current alcohol use. She reports that she does not use drugs.   Family History   Her family history includes Alcohol abuse in her father; Cancer in her maternal aunt, sister, and sister.   Allergies Allergies  Allergen Reactions   Benadryl [Diphenhydramine Hcl] Hypertension   Ribavirin Rash   Viekira Pak [Ombitasvir-Paritaprevir-Ritonavir-Dasabuvir] Rash    Hives like reaction     Home Medications  Prior to Admission medications   Medication Sig Start Date End Date Taking? Authorizing Provider  amLODipine (NORVASC) 5 MG tablet TAKE ONE TABLET BY MOUTH DAILY Patient taking differently: Take 5 mg by mouth daily.  07/12/19  Yes Oda Kilts, MD  divalproex (DEPAKOTE ER) 500 MG 24 hr tablet TAKE ONE TABLET BY MOUTH DAILY Patient taking differently: Take 500 mg by mouth daily.  07/14/19  Yes Axel Filler, MD  gabapentin (NEURONTIN) 300 MG capsule TAKE TWO CAPSULES BY MOUTH TWICE A DAY Patient taking differently: Take 300 mg by mouth 2 (two) times daily. TAKE TWO CAPSULES BY MOUTH TWICE A DAY 07/12/19  Yes Oda Kilts, MD  lisinopril-hydrochlorothiazide (ZESTORETIC) 20-25 MG tablet TAKE ONE TABLET BY MOUTH DAILY Patient taking differently: Take 1 tablet by mouth daily.  07/12/19  Yes Oda Kilts, MD  Multiple Vitamin (MULTIVITAMIN WITH MINERALS) TABS tablet Take 1 tablet by mouth daily.   Yes [provider]  [place] 600-50-300 MG tablet TAKE ONE TABLET BY MOUTH DAILY Patient taking differently: Take 1 tablet  by mouth daily.  10/10/19  Yes Campbell Riches, MD  aluminum sulfate-calcium acetate [facility]) packet Apply 1 packet topically 3 (three) times daily. Patient not taking: Reported on 04/30/2019 01/06/19   Ok Edwards, PA-C     Sanjuan Dame, MD Internal Medicine PGY-1 [phone]

## 2019-11-24 NOTE — Progress Notes (Signed)
Pt arrived to [address], alert, confused, cursing, yelling. Hypertensive. Precedex gtt started, prn ativan given.

## 2019-11-25 DIAGNOSIS — I214 Non-ST elevation (NSTEMI) myocardial infarction: Secondary | ICD-10-CM

## 2019-11-25 DIAGNOSIS — F10231 Alcohol dependence with withdrawal delirium: Secondary | ICD-10-CM | POA: Diagnosis not present

## 2019-11-25 LAB — CBC WITH DIFFERENTIAL/PLATELET
Abs Immature Granulocytes: 0.06 10*3/uL (ref 0.00–0.07)
Basophils Absolute: 0 10*3/uL (ref 0.0–0.1)
Basophils Relative: 1 %
Eosinophils Absolute: 0.1 10*3/uL (ref 0.0–0.5)
Eosinophils Relative: 1 %
HCT: 36.4 % (ref 36.0–46.0)
Hemoglobin: 12.1 g/dL (ref 12.0–15.0)
Immature Granulocytes: 1 %
Lymphocytes Relative: 26 %
Lymphs Abs: 1.5 10*3/uL (ref 0.7–4.0)
MCH: 32.9 pg (ref 26.0–34.0)
MCHC: 33.2 g/dL (ref 30.0–36.0)
MCV: 98.9 fL (ref 80.0–100.0)
Monocytes Absolute: 0.5 10*3/uL (ref 0.1–1.0)
Monocytes Relative: 8 %
Neutro Abs: 3.8 10*3/uL (ref 1.7–7.7)
Neutrophils Relative %: 63 %
Platelets: DECREASED 10*3/uL (ref 150–400)
RBC: 3.68 MIL/uL — ABNORMAL LOW (ref 3.87–5.11)
RDW: 12.9 % (ref 11.5–15.5)
WBC: 6 10*3/uL (ref 4.0–10.5)
nRBC: 0 % (ref 0.0–0.2)

## 2019-11-25 LAB — COMPREHENSIVE METABOLIC PANEL
ALT: 14 U/L (ref 0–44)
AST: 37 U/L (ref 15–41)
Albumin: 3 g/dL — ABNORMAL LOW (ref 3.5–5.0)
Alkaline Phosphatase: 58 U/L (ref 38–126)
Anion gap: 15 (ref 5–15)
BUN: 15 mg/dL (ref 8–23)
CO2: 16 mmol/L — ABNORMAL LOW (ref 22–32)
Calcium: 9.5 mg/dL (ref 8.9–10.3)
Chloride: 110 mmol/L (ref 98–111)
Creatinine, Ser: 1.19 mg/dL — ABNORMAL HIGH (ref 0.44–1.00)
GFR, Estimated: 48 mL/min — ABNORMAL LOW (ref >60)
Glucose, Bld: 96 mg/dL (ref 70–99)
Potassium: 4.2 mmol/L (ref 3.5–5.1)
Sodium: 141 mmol/L (ref 135–145)
Total Bilirubin: 1 mg/dL (ref 0.3–1.2)
Total Protein: 7.3 g/dL (ref 6.5–8.1)

## 2019-11-25 LAB — GLUCOSE, CAPILLARY
Glucose-Capillary: 78 mg/dL (ref 70–99)
Glucose-Capillary: 81 mg/dL (ref 70–99)
Glucose-Capillary: 82 mg/dL (ref 70–99)
Glucose-Capillary: 86 mg/dL (ref 70–99)
Glucose-Capillary: 90 mg/dL (ref 70–99)
Glucose-Capillary: 95 mg/dL (ref 70–99)

## 2019-11-25 LAB — LIPID PANEL
Cholesterol: 227 mg/dL — ABNORMAL HIGH (ref 0–200)
HDL: 35 mg/dL — ABNORMAL LOW (ref >40)
LDL Cholesterol: 148 mg/dL — ABNORMAL HIGH (ref 0–99)
Total CHOL/HDL Ratio: 6.5 RATIO
Triglycerides: 219 mg/dL — ABNORMAL HIGH (ref <150)
VLDL: 44 mg/dL — ABNORMAL HIGH (ref 0–40)

## 2019-11-25 MED ORDER — SODIUM CHLORIDE 0.9% FLUSH: 10.0000 mL | INTRAVENOUS | Status: DC | PRN

## 2019-11-25 MED ORDER — SODIUM CHLORIDE 0.9% FLUSH
10.0000 mL | Freq: Two times a day (BID) | INTRAVENOUS | Status: DC
  Administered 2019-11-26 – 2019-11-30 (×10): 10 mL

## 2019-11-25 MED ORDER — HYDRALAZINE HCL 20 MG/ML IJ SOLN
10.0000 mg | INTRAMUSCULAR | Status: DC | PRN
  Administered 2019-11-25 – 2019-11-27 (×5): 10 mg via INTRAVENOUS
  Filled 2019-11-25 (×4): qty 1

## 2019-11-25 NOTE — Progress Notes (Signed)
NAME:  Sharon Norris, MRN:  [id number], DOB:  07-May-1955, LOS: 4 ADMISSION DATE:  11/21/2019, CONSULTATION DATE:  11/24/19 REFERRING MD:  Dr. Verlon Au, CHIEF COMPLAINT:  AMS  Brief History   [name] is a 64 y/o female with a PMHx of HTN, HIV, prior Hep C, polysubstance abuse who presented to the ED with c/o chest pain and admitted for NSTEMI, acute toxic encephalopathy, acute infarct, CAP, AoCKD and prolonged QTc. NSTEMI thought to be 2/2 cocaine use and encephalopathy 2/2 alcohol withdrawal. Despite 45mg  Ativan over last 24 hours, patient continues to be altered. PCCM consulted for precedex gtt.  History of present illness   Sharon Norris is a 64 y.o. female with medical history significant for HIV, alcohol abuse, hypertension, remote history of IV drug abuse, and chronic kidney disease stage IIIb-IV, presenting to the emergency department for evaluation of chest pain.  Patient reports that she developed a burning sensation involving her left side around 6 PM yesterday.  The discomfort intensified and became more localized to the left chest.  Burning chest pain has now been constant and without any alleviating or exacerbating factors.  Patient also reports shortness of breath and productive cough.  She reports occasional leg swelling but is unsure if they are swollen now.  She denies abdominal pain. In ED, found to have NSTEMI, acute encephalopathy, and possible pneumonia, subsequently admitted to [facility]. Cardiology following, contributing NSTEMI to cocaine use (UDS+). During hospitalization, found to have left cerebellar hemisphere acute/subacute infarct, neurology consulted.  Patient has also increasingly become agitated during her stay. Per RN, patient unwilling to take oral medications, swearing at staff, and hallucinating. CIWA's 20-30, requiring 45mg  Ativan over the last 24 hours.   Past Medical History  HTN, HIV, CKD stage 4, prior hep C, polysubstance abuse  Significant Hospital Events    10/5 >> Admitted to [facility] 10/8 >> PCCM consulted for agitation resistant to benzodiazepines  Consults:  Cardiology, Neurology, PCCM  Procedures:  N/A  Significant Diagnostic Tests:  CXR 10/5 >> Left lung base with atelectasis versus infectiously etiology.  CT Head 10/5 >> Negative for acute intracranial abnormality TTE 10/5 >> EF 51-70%, grade 1 diastolic dysfunction BLE u/s 10/5 >> No evidence of DVT bilaterally MRI Head 10/8 >> Restricted diffusion within left cerebellar hemisphere consistent w/ acute/subacute infarct CXR 10/8 >> Chronic atelectasis, stable with last CXR  Micro Data:  MRSA > neg COVID-19 > neg Influenza A/B > neg  Antimicrobials:  Rocephin 10/5 -  Doxycycline 10/5 -   Interim history/subjective:   Shortly after initiation of Precedex, patient experienced bradycardia with a short pause. Drip was slowed.   This morning, patient continues to endorses pain, nodding her head when asked if all over. She is tearful and appears mildly distressed.   Objective   Blood pressure 132/75, pulse 71, temperature (!) 97.2 F (36.2 C), temperature source Axillary, resp. rate (!) 23, height 5\' 3"  (1.6 m), weight 91.4 kg, SpO2 99 %.        Intake/Output Summary (Last 24 hours) at 11/25/2019 1044 Last data filed at 11/25/2019 0900 Gross per 24 hour  Intake 1706.82 ml  Output 1050 ml  Net 656.82 ml   Filed Weights   11/22/19 0417 11/23/19 0500 11/25/19 0500  Weight: 90.3 kg 90.2 kg 91.4 kg   Physical Exam Vitals and nursing note reviewed.  Constitutional:      General: She is awake.     Appearance: She is obese.  Cardiovascular:  Rate and Rhythm: Normal rate and regular rhythm.     Heart sounds: No murmur heard.   Pulmonary:     Effort: Pulmonary effort is normal. No accessory muscle usage or respiratory distress.     Breath sounds: Normal breath sounds.  Neurological:     Comments: Not able to follow commands this morning. Moving all extremities though;  unable to assess strength.   Psychiatric:        Mood and Affect: Mood is anxious. Affect is tearful.        Speech: She is noncommunicative.        Behavior: Behavior is uncooperative.    Resolved Hospital Problem list     Assessment & Plan:   # Acute metabolic encephalopathy d/t alcohol withdrawal - Per chart review, patient agitated, tremulous, and tachycardic on arrival - Acute worsening of agitation over first 72 hours of admission, including refusing oral medications, swearing at staff, and hallucinating requiring four-point restraints  - Transferred to the ICU for Precedex gtt last night - Some bradycardia with drip; will need to monitor closely - Continue precedex gtt - Continue restraints as needed  # Acute/subacute left cerebellar hemisphere infarct - Left hand weakness as primary presentation concerning for infarct.   - Neurology on board; appreciate their recommendations  - BP control w/ SBP goal <140 - C/w ASA 325, Atorvastatin 80 - Frequent neuro checks - Will order CTA head and neck, per Neurology's recommendations, pending BMP this morning.  # CAP - No oxygen requirements; remained afebrile.  - Continue with Rocephin, doxycycline (day 4/5)  # NSTEMI - Markedly elevated troponins on arrival with a maximum of 4910.  - TTE 10/5 w/ EF 60-65%, no WMA - Cardiology consulted and feels likely due to cocaine related ischemia with possible myocarditis; recommended de-escalation of Lovenox to ppx dose only, continuation of aspirin and high intensity statin. Avoid non-selective beta-blockers, but okay to continue with Carvedilol at this time. Recommended outpatient follow up   # Acute on chronic stage III kidney disease - Baseline ~ 1.3 - 1.6  - Creatinine improved yesterday back to baseline.  - BMP for this morning still pending  - Avoid nephrotoxins  # HTN - SBP goal <140 - Current regimen includes Carvedilol 12.5 mg BID, Imdur 120 mg daily, PRN Hydralazine -  Required two doses of Hydralazine overnight, likely due to missing her anti-HTNs yesterday. She did take them today, so will wait to make any dosage changes.  - Continue with current regimen.   # Macrocytic anemia - Most likely vitamin deficiency 2/2 alcohol use - Check B12, folate  # HIV - Last CD4+ 410 2019 - Re-checking CD4+ in setting of acute encephalopathy - Continue Triumeq 1 qd  # Pre-diabetes - A1c 6.1 on admission - C/w SSI - CBG monitoring  Best practice:  Diet: NPO except w/ meds DVT prophylaxis: Lovenox GI prophylaxis: n/a Glucose control: SSI Mobility: Bedrest Code Status: Full Family Communication: Daughter primary contact Disposition: ICU  Labs   CBC: Recent Labs  Lab 11/21/19 0253 11/21/19 0604 11/22/19 0828 11/23/19 0104 11/24/19 0034  WBC 15.5* 14.1* 8.3 7.9 11.0*  NEUTROABS 13.0*  --   --  5.2 7.0  HGB 11.4* 10.3* 10.1* 9.6* 10.7*  HCT 34.8* 32.8* 32.0* 29.8* 33.2*  MCV 102.1* 104.5* 103.6* 103.8* 103.1*  PLT 238 223 208 PLATELET CLUMPS NOTED ON SMEAR, COUNT APPEARS ADEQUATE 856    Basic Metabolic Panel: Recent Labs  Lab 11/21/19 0253 11/21/19 0604 11/22/19 3149  11/23/19 0104 11/24/19 0034  NA 144 139 139 140 141  K 3.8 3.7 3.7 4.1 3.9  CL 109 109 107 109 112*  CO2 18* 18* 19* 18* 18*  GLUCOSE 224* 180* 137* 115* 111*  BUN 39* 37* 18 13 10   CREATININE 2.28* 2.04* 1.49* 1.42* 1.24*  CALCIUM 9.1 8.5* 8.3* 8.7* 9.0  MG  --  2.2  --   --   --   PHOS  --  2.8  --   --   --    GFR: Estimated Creatinine Clearance: 47.9 mL/min (A) (by C-G formula based on SCr of 1.24 mg/dL (H)). Recent Labs  Lab 11/21/19 0604 11/22/19 0828 11/23/19 0104 11/24/19 0034  PROCALCITON <0.10  --   --   --   WBC 14.1* 8.3 7.9 11.0*    Liver Function Tests: Recent Labs  Lab 11/21/19 0253 11/21/19 0604 11/23/19 0104 11/24/19 0034  AST 37 36 28 20  ALT 23 22 14 14   ALKPHOS 68 63 57 57  BILITOT 0.7 0.5 0.7 0.3  PROT 8.7* 7.6 6.7 7.2  ALBUMIN 3.8  3.5 2.9* 2.9*   No results for input(s): LIPASE, AMYLASE in the last 168 hours. Recent Labs  Lab 11/23/19 0104  AMMONIA 46*    ABG    Component Value Date/Time   PHART 7.651 (HH) 12/31/2006 1807   PCO2ART 14.3 (LL) 12/31/2006 1807   HCO3 15.8 (L) 12/31/2006 1807   TCO2 16 12/31/2006 1807   ACIDBASEDEF 5.0 (H) 12/31/2006 1807     Coagulation Profile: Recent Labs  Lab 11/21/19 0403  INR 1.0    Cardiac Enzymes: No results for input(s): CKTOTAL, CKMB, CKMBINDEX, TROPONINI in the last 168 hours.  HbA1C: Hgb A1c MFr Bld  Date/Time Value Ref Range Status  11/21/2019 06:04 AM 6.2 (H) 4.8 - 5.6 % Final    Comment:    (NOTE) Pre diabetes:          5.7%-6.4%  Diabetes:              >6.4%  Glycemic control for   <7.0% adults with diabetes     CBG: Recent Labs  Lab 11/24/19 1716 11/24/19 1927 11/24/19 2326 11/25/19 0332 11/25/19 0735  GLUCAP 113* 108* 107* 95 86    Review of Systems:   Unable to assess 2/2 AMS  Past Medical History  She,  has a past medical history of Anxiety, Bronchitis, Depression, Excessive cerumen in both ear canals (07/02/2017), H/O drug abuse ([place]), Hepatitis, History of suicidal ideation, HIV infection ([place]), Hypertension, Neuropathy, Sleep difficulties, and Trichomonal infection.   Surgical History    Past Surgical History:  Procedure Laterality Date  . COLPOSCOPY  2010     Social History   reports that she quit smoking about 19 years ago. She has never used smokeless tobacco. She reports current alcohol use. She reports that she does not use drugs.   Family History   Her family history includes Alcohol abuse in her father; Cancer in her maternal aunt, sister, and sister.   Allergies Allergies  Allergen Reactions  . Benadryl [Diphenhydramine Hcl] Hypertension  . Ribavirin Rash  . Viekira Pak [Ombitasvir-Paritaprevir-Ritonavir-Dasabuvir] Rash    Hives like reaction     Home Medications  Prior to Admission medications     Medication Sig Start Date End Date Taking? Authorizing Provider  amLODipine (NORVASC) 5 MG tablet TAKE ONE TABLET BY MOUTH DAILY Patient taking differently: Take 5 mg by mouth daily.  07/12/19  Yes Raines,  Raynaldo Opitz, MD  divalproex (DEPAKOTE ER) 500 MG 24 hr tablet TAKE ONE TABLET BY MOUTH DAILY Patient taking differently: Take 500 mg by mouth daily.  07/14/19  Yes Axel Filler, MD  gabapentin (NEURONTIN) 300 MG capsule TAKE TWO CAPSULES BY MOUTH TWICE A DAY Patient taking differently: Take 300 mg by mouth 2 (two) times daily. TAKE TWO CAPSULES BY MOUTH TWICE A DAY 07/12/19  Yes Oda Kilts, MD  lisinopril-hydrochlorothiazide (ZESTORETIC) 20-25 MG tablet TAKE ONE TABLET BY MOUTH DAILY Patient taking differently: Take 1 tablet by mouth daily.  07/12/19  Yes Oda Kilts, MD  Multiple Vitamin (MULTIVITAMIN WITH MINERALS) TABS tablet Take 1 tablet by mouth daily.   Yes [provider]  [place] 600-50-300 MG tablet TAKE ONE TABLET BY MOUTH DAILY Patient taking differently: Take 1 tablet by mouth daily.  10/10/19  Yes Campbell Riches, MD  aluminum sulfate-calcium acetate [facility]) packet Apply 1 packet topically 3 (three) times daily. Patient not taking: Reported on 04/30/2019 01/06/19   Ok Edwards, PA-C     Dr. Jose Persia Internal Medicine PGY-2  Pager: [phone]  11/25/2019, 12:31 PM

## 2019-11-25 NOTE — Progress Notes (Signed)
STROKE TEAM PROGRESS NOTE   HISTORY OF PRESENT ILLNESS (per record) Sharon Norris is a 63 y.o. female with past medical history of neuropathy, hypertension, anxiety and chronic kidney disease stage IV who presented to the emergency department with chest pain.  While in the hospital daughter had noted that her left side was weaker than normal thus MRI was obtained. Although significantly motion degraded, the MRI did show a focus of restricted diffusion within the left cerebellar hemisphere consistent with an acute/subacute infarct. For this reason Neurology was consulted. At the time of Neurology consultation, the patient is extremely agitated and requiring four-point restraints.  She is yelling profanities at staff.   Of note, she also has a history of drug abuse and has claimed to have been drug-free for 12 years, but was cocaine positive on toxicology screen yesterday (10/7). Family also endorsed ongoing heavy EtOH abuse. The patient was initially conversant this admission, then became progressively more agitated. Tremor was noted by nursing staff initially. She had been requesting that family bring in beer for her to drink, consistent with craving. She has required > 40 mg total of Ativan over the past 24 hours for her increasing agitation. ICU team is consulting and is considering Precedex for probable DTs.  LKW: Unknown tpa given?: no, unknown last seen normal Premorbid modified Rankin scale (mRS): 0 Unable to obtain NIH stroke scale secondary to patient's agitation.   INTERVAL HISTORY No family members present. Lethargic sedated with Precedex drip.. Not following commands. Responds to painful stimuli.  Blood pressure adequately controlled.  Vital signs stable.  MRI scan shows tiny left cerebellar infarct.  Urine drug screen positive for cocaine.  Echocardiogram is unremarkable.  CT angiograms and lipid profile are pending    OBJECTIVE Vitals:   11/25/19 0700 11/25/19 0800 11/25/19 0900  11/25/19 1000  BP: 123/67 132/75    Pulse: 62 71 65 68  Resp: (!) 23 (!) 23 (!) 22 (!) 30  Temp:  (!) 97.2 F (36.2 C)    TempSrc:  Axillary    SpO2: 95% 99% 96% 98%  Weight:      Height:        CBC:  Recent Labs  Lab 11/23/19 0104 11/24/19 0034  WBC 7.9 11.0*  NEUTROABS 5.2 7.0  HGB 9.6* 10.7*  HCT 29.8* 33.2*  MCV 103.8* 103.1*  PLT PLATELET CLUMPS NOTED ON SMEAR, COUNT APPEARS ADEQUATE 403    Basic Metabolic Panel:  Recent Labs  Lab 11/21/19 0604 11/22/19 0828 11/23/19 0104 11/24/19 0034  NA 139   < > 140 141  K 3.7   < > 4.1 3.9  CL 109   < > 109 112*  CO2 18*   < > 18* 18*  GLUCOSE 180*   < > 115* 111*  BUN 37*   < > 13 10  CREATININE 2.04*   < > 1.42* 1.24*  CALCIUM 8.5*   < > 8.7* 9.0  MG 2.2  --   --   --   PHOS 2.8  --   --   --    < > = values in this interval not displayed.    Lipid Panel:     Component Value Date/Time   CHOL 239 (H) 03/04/2017 1000   CHOL 265 (H) 02/19/2015 1423   TRIG 174 (H) 03/04/2017 1000   HDL 62 03/04/2017 1000   HDL 58 02/19/2015 1423   CHOLHDL 3.9 03/04/2017 1000   VLDL 36 (H) 04/08/2016 1430   LDLCALC  146 (H) 03/04/2017 1000   HgbA1c:  Lab Results  Component Value Date   HGBA1C 6.2 (H) 11/21/2019   Urine Drug Screen:     Component Value Date/Time   LABOPIA NONE DETECTED 11/23/2019 1629   COCAINSCRNUR POSITIVE (A) 11/23/2019 1629   LABBENZ POSITIVE (A) 11/23/2019 1629   AMPHETMU NONE DETECTED 11/23/2019 1629   THCU NONE DETECTED 11/23/2019 1629   LABBARB NONE DETECTED 11/23/2019 1629    Alcohol Level     Component Value Date/Time   ETH <10 11/21/2019 0604    IMAGING   MR BRAIN WO CONTRAST  Result Date: 11/24/2019 CLINICAL DATA:  Stroke, follow-up. EXAM: MRI HEAD WITHOUT CONTRAST TECHNIQUE: Multiplanar, multiecho pulse sequences of the brain and surrounding structures were obtained without intravenous contrast. COMPARISON:  Head CT November 21, 2019. FINDINGS: The study is severely degraded by motion,  most images are nondiagnostic. Brain: Focus of restricted diffusion within the left cerebellar hemisphere consistent with acute/subacute. There is hyperintensity the diffusion weighted sequence diffusely the right cerebellar hemisphere cortex and right temporoparietal and occipital cortex with no corresponding hyperintensity on the T2 sequence, may be artifactual. Hazy T2 hyperintensity within the white matter of the cerebral hemispheres, nonspecific. No large hemorrhage hydrocephalus, large extra-axial collection mass lesion. Vascular: Normal flow voids. Skull and upper cervical spine: Grossly unremarkable. Sinuses/Orbits: Grossly unremarkable. Other: None. IMPRESSION: 1. Severely motion degraded study, most images are nondiagnostic. 2. Focus of restricted diffusion within the left cerebellar hemisphere consistent with acute/subacute infarct. 3. Hyperintensity on diffusion weighted sequence diffusely in the right cerebellar hemisphere cortex and right temporoparietal and occipital cortex, likely artifactual. These results will be called to the ordering clinician or representative by the Radiologist Assistant, and communication documented in the PACS or [employer]. Electronically Signed   By: Pedro Earls M.D.   On: 11/24/2019 10:27   DG CHEST PORT 1 VIEW  Result Date: 11/24/2019 CLINICAL DATA:  Pneumonia.  Confusion. EXAM: PORTABLE CHEST 1 VIEW COMPARISON:  11/21/2019 FINDINGS: 0627 hours. Low volumes with asymmetric elevation right hemidiaphragm, similar to prior. Streaky opacity at the left base compatible with atelectasis, not substantially changed. No edema or pleural effusion. Cardiopericardial silhouette is at upper limits of normal for size. The visualized bony structures of the thorax show no acute abnormality. Telemetry leads overlie the chest. IMPRESSION: Low volume film with chronic atelectasis or scarring at the left base. Electronically Signed   By: Misty Stanley M.D.   On:  11/24/2019 07:45     Transthoracic Echocardiogram  11/21/2019 1. Mild intracavitary gradient. Peak velocity 1.78 m/s. Peak gradient  12.6 mmHg. Left ventricular ejection fraction, by estimation, is 60 to  65%. The left ventricle has normal function. The left ventricle has no  regional wall motion abnormalities. There  is mild concentric left ventricular hypertrophy. Left ventricular  diastolic parameters are consistent with Grade I diastolic dysfunction  (impaired relaxation). Elevated left ventricular end-diastolic pressure.  2. Right ventricular systolic function is normal. The right ventricular  size is normal. There is normal pulmonary artery systolic pressure.  3. The mitral valve is normal in structure. No evidence of mitral valve  regurgitation. No evidence of mitral stenosis.  4. The aortic valve is normal in structure. Aortic valve regurgitation is  not visualized. No aortic stenosis is present.  5. The inferior vena cava is normal in size with <50% respiratory  variability, suggesting right atrial pressure of 8 mmHg.      ECG - SR rate 90 BPM. (See  cardiology reading for complete details)   PHYSICAL EXAM Blood pressure 132/75, pulse 68, temperature (!) 97.2 F (36.2 C), temperature source Axillary, resp. rate (!) 30, height 5\' 3"  (1.6 m), weight 91.4 kg, SpO2 98 %. Middle-aged African-American lady who is sedated. . Afebrile. Head is nontraumatic. Neck is supple without bruit.    Cardiac exam no murmur or gallop. Lungs are clear to auscultation. Distal pulses are well felt. Neurological Exam ;  Sedated, lethargic.  Barely opens eyes.  Mutters few sounds.  Does not follow commands..eye movements full without nystagmus.fundi were not visualized. Vision acuity and fields and hearing cannot be reliably tested.  l. Face symmetric. Tongue midline. Normal strength, tone, reflexes and withdraws all 4 extremities equally well to stimuli.  Normal sensation. Gait  deferred.            ASSESSMENT/PLAN [name] is a 64 y.o. female with history of neuropathy, hepatitis C, HIV, hypertension, anxiety, depression, ETOH abuse, substance abuse and chronic kidney disease stage IV who presented to the emergency department with chest pain where she was noted to have left sided weakness.  She became extremely agitated and UDS was found to be positive for cocaine. Later she was noted to have DTs.  She did not receive IV t-PA due to unknown time of onset.    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    Resultant altered mental status due to alcohol and drug withdrawal  Code Stroke CT Head - not ordered  CT head - not ordered  MRI head - Severely motion degraded study, most images are nondiagnostic. Focus of restricted diffusion within the left cerebellar hemisphere consistent with acute/subacute infarct. Hyperintensity on diffusion weighted sequence diffusely in the right cerebellar hemisphere cortex and right temporoparietal and occipital cortex, likely artifactual.  MRA head - not ordered  CTA H&N - pending  CT Perfusion - not ordered  Carotid Doppler - CTA neck ordered - carotid dopplers not indicated.   2D Echo - EF 60 - 65%. No cardiac source of emboli identified.   Bilateral Lower Extremity Venous Dopplers - negative for DVT  Lacey Jensen Virus 2 - negative  LDL - pending  HgbA1c - 6.2  UDS positive for cocaine and - benzodiazepine  VTE prophylaxis - Lovenox Diet  Diet Order            Diet NPO time specified Except for: Sips with Meds  Diet effective now                 No antithrombotic prior to admission, now on aspirin 325 mg daily  Patient counseled to be compliant with her antithrombotic medications  Ongoing aggressive stroke risk factor management  Therapy recommendations:  pending  Disposition:  Pending  Hypertension  Home BP meds: amlodipine ; lisinopril ;  HCTZ  Current BP meds: Coreg ; isosorbide  Stable . Permissive hypertension (OK if < 220/120) but gradually normalize in 5-7 days  . Long-term BP goal normotensive  Hyperlipidemia  Home Lipid lowering medication: none   LDL pending, goal < 70  Current lipid lowering medication: Lipitor 80 mg daily   Continue statin at discharge  Other Stroke Risk Factors  Advanced age  Former cigarette smoker - quit  ETOH abuse - will be advised to abstain.   Obesity, Body mass index is 35.69 kg/m., recommend weight loss, diet and exercise as appropriate   Substance Abuse  Other Active Problems  Code status - Full code  HIV  HEP C  CKD - creatinine - 1.24  Anemia - Hgb - 10.7  Mild leukocytosis - WBC's - 11.0 (afebrile)  Substance abuse  ETOH - DTs   Hospital day # 4 She presented with chest pain and was found to have some left-sided weakness and MRI showed a small left cerebellar infarct which he then became agitated due to alcohol withdrawal and is now currently in sedation.  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  Recommend check CT angiogram of brain and neck, lipid profile.  Aspirin for stroke prevention.  Continue ongoing management for alcohol withdrawal as per critical care team.  Discussed with Dr. Marolyn Hammock critical care medicine. This patient is critically ill and at significant risk of neurological worsening, death and care requires constant monitoring of vital signs, hemodynamics,respiratory and cardiac monitoring, extensive review of multiple databases, frequent neurological assessment, discussion with family, other specialists and medical decision making of high complexity.I have made any additions or clarifications directly to the above note.This critical care time does not reflect procedure time, or teaching time or supervisory time of PA/NP/Med Resident etc but could involve care discussion time.  I spent  30 minutes of neurocritical care time  in the care of  this patient.    Antony Contras, MD  To contact Stroke Continuity provider, please refer to [email or web address] After hours, contact General Neurology

## 2019-11-25 NOTE — Progress Notes (Signed)
[facility] Progress Note Patient Name: Melitta Tigue DOB: 1955-05-30 MRN: [id number]   Date of Service  11/25/2019  HPI/Events of Note  Patient needs adjustments to SBP parameters for PRN Hydralazine.  eICU Interventions  PRN threshold for Hydralazine changed to SBP  > 160 mmHg.        Laveda Demedeiros U Jordi Kamm 11/25/2019, 12:30 AM

## 2019-11-26 ENCOUNTER — Inpatient Hospital Stay (HOSPITAL_COMMUNITY): Payer: Medicare Other

## 2019-11-26 DIAGNOSIS — I214 Non-ST elevation (NSTEMI) myocardial infarction: Secondary | ICD-10-CM

## 2019-11-26 DIAGNOSIS — F10231 Alcohol dependence with withdrawal delirium: Secondary | ICD-10-CM | POA: Diagnosis not present

## 2019-11-26 LAB — CBC WITH DIFFERENTIAL/PLATELET
Abs Immature Granulocytes: 0.05 10*3/uL (ref 0.00–0.07)
Basophils Absolute: 0 10*3/uL (ref 0.0–0.1)
Basophils Relative: 0 %
Eosinophils Absolute: 0 10*3/uL (ref 0.0–0.5)
Eosinophils Relative: 0 %
HCT: 35.3 % — ABNORMAL LOW (ref 36.0–46.0)
Hemoglobin: 11.3 g/dL — ABNORMAL LOW (ref 12.0–15.0)
Immature Granulocytes: 1 %
Lymphocytes Relative: 18 %
Lymphs Abs: 1.8 10*3/uL (ref 0.7–4.0)
MCH: 31.8 pg (ref 26.0–34.0)
MCHC: 32 g/dL (ref 30.0–36.0)
MCV: 99.4 fL (ref 80.0–100.0)
Monocytes Absolute: 0.9 10*3/uL (ref 0.1–1.0)
Monocytes Relative: 9 %
Neutro Abs: 7.2 10*3/uL (ref 1.7–7.7)
Neutrophils Relative %: 72 %
Platelets: 273 10*3/uL (ref 150–400)
RBC: 3.55 MIL/uL — ABNORMAL LOW (ref 3.87–5.11)
RDW: 12.9 % (ref 11.5–15.5)
WBC: 10.1 10*3/uL (ref 4.0–10.5)
nRBC: 0 % (ref 0.0–0.2)

## 2019-11-26 LAB — BASIC METABOLIC PANEL
Anion gap: 13 (ref 5–15)
BUN: 14 mg/dL (ref 8–23)
CO2: 18 mmol/L — ABNORMAL LOW (ref 22–32)
Calcium: 9.5 mg/dL (ref 8.9–10.3)
Chloride: 111 mmol/L (ref 98–111)
Creatinine, Ser: 1.22 mg/dL — ABNORMAL HIGH (ref 0.44–1.00)
GFR, Estimated: 46 mL/min — ABNORMAL LOW (ref >60)
Glucose, Bld: 86 mg/dL (ref 70–99)
Potassium: 3.4 mmol/L — ABNORMAL LOW (ref 3.5–5.1)
Sodium: 142 mmol/L (ref 135–145)

## 2019-11-26 LAB — GLUCOSE, CAPILLARY
Glucose-Capillary: 107 mg/dL — ABNORMAL HIGH (ref 70–99)
Glucose-Capillary: 109 mg/dL — ABNORMAL HIGH (ref 70–99)
Glucose-Capillary: 142 mg/dL — ABNORMAL HIGH (ref 70–99)
Glucose-Capillary: 85 mg/dL (ref 70–99)
Glucose-Capillary: 96 mg/dL (ref 70–99)
Glucose-Capillary: 97 mg/dL (ref 70–99)

## 2019-11-26 MED ORDER — SODIUM CHLORIDE 0.9 % IV BOLUS
250.0000 mL | Freq: Once | INTRAVENOUS | Status: AC
  Administered 2019-11-26: 250 mL via INTRAVENOUS

## 2019-11-26 MED ORDER — CHLORHEXIDINE GLUCONATE 0.12 % MT SOLN
15.0000 mL | Freq: Two times a day (BID) | OROMUCOSAL | Status: DC
  Administered 2019-11-26 – 2019-11-27 (×3): 15 mL via OROMUCOSAL
  Filled 2019-11-26: qty 15

## 2019-11-26 MED ORDER — ORAL CARE MOUTH RINSE
15.0000 mL | Freq: Two times a day (BID) | OROMUCOSAL | Status: DC
  Administered 2019-11-26: 15 mL via OROMUCOSAL

## 2019-11-26 MED ORDER — POTASSIUM CHLORIDE CRYS ER 20 MEQ PO TBCR
40.0000 meq | EXTENDED_RELEASE_TABLET | Freq: Once | ORAL | Status: DC
  Filled 2019-11-26: qty 2

## 2019-11-26 MED ORDER — SODIUM CHLORIDE 0.9 % IV SOLN: INTRAVENOUS | Status: DC

## 2019-11-26 MED ORDER — IOHEXOL 350 MG/ML SOLN
75.0000 mL | Freq: Once | INTRAVENOUS | Status: AC | PRN
  Administered 2019-11-26: 75 mL via INTRAVENOUS

## 2019-11-26 MED ORDER — CHLORDIAZEPOXIDE HCL 25 MG PO CAPS
25.0000 mg | ORAL_CAPSULE | Freq: Once | ORAL | Status: AC
  Administered 2019-11-26: 25 mg via ORAL
  Filled 2019-11-26: qty 1

## 2019-11-26 NOTE — Progress Notes (Signed)
[facility] Progress Note Patient Name: Ura Yingling DOB: 08/10/1955 MRN: [id number]   Date of Service  11/26/2019  HPI/Events of Note  K+ 3.4, creatinine 1.22  eICU Interventions  Adult electrolyte protocol for K+ replacement ordered.        Kerry Kass Anna Beaird 11/26/2019, 4:56 AM

## 2019-11-26 NOTE — Progress Notes (Signed)
NAME:  Sharon Norris, MRN:  [id number], DOB:  06-May-1955, LOS: 5 ADMISSION DATE:  11/21/2019, CONSULTATION DATE:  11/24/19 REFERRING MD:  Dr. Verlon Au, CHIEF COMPLAINT:  AMS  Brief History   [name] is a 64 y/o female with a PMHx of HTN, HIV, prior Hep C, polysubstance abuse who presented to the ED with c/o chest pain and admitted for NSTEMI, acute toxic encephalopathy, acute infarct, CAP, AoCKD and prolonged QTc. NSTEMI thought to be 2/2 cocaine use and encephalopathy 2/2 alcohol withdrawal. Despite 45mg  Ativan over last 24 hours, patient continues to be altered. PCCM consulted for precedex gtt.  History of present illness   Sharon Norris is a 64 y.o. female with medical history significant for HIV, alcohol abuse, hypertension, remote history of IV drug abuse, and chronic kidney disease stage IIIb-IV, presenting to the emergency department for evaluation of chest pain.  Patient reports that she developed a burning sensation involving her left side around 6 PM yesterday.  The discomfort intensified and became more localized to the left chest.  Burning chest pain has now been constant and without any alleviating or exacerbating factors.  Patient also reports shortness of breath and productive cough.  She reports occasional leg swelling but is unsure if they are swollen now.  She denies abdominal pain. In ED, found to have NSTEMI, acute encephalopathy, and possible pneumonia, subsequently admitted to [facility]. Cardiology following, contributing NSTEMI to cocaine use (UDS+). During hospitalization, found to have left cerebellar hemisphere acute/subacute infarct, neurology consulted.  Patient has also increasingly become agitated during her stay. Per RN, patient unwilling to take oral medications, swearing at staff, and hallucinating. CIWA's 20-30, requiring 45mg  Ativan over the last 24 hours.   Past Medical History  HTN, HIV, CKD stage 4, prior hep C, polysubstance abuse  Significant Hospital Events    10/5 >> Admitted to [facility] 10/8 >> PCCM consulted for agitation resistant to benzodiazepines  Consults:  Cardiology, Neurology, PCCM  Procedures:  N/A  Significant Diagnostic Tests:  CXR 10/5 >> Left lung base with atelectasis versus infectiously etiology.  CT Head 10/5 >> Negative for acute intracranial abnormality TTE 10/5 >> EF 31-49%, grade 1 diastolic dysfunction BLE u/s 10/5 >> No evidence of DVT bilaterally MRI Head 10/8 >> Restricted diffusion within left cerebellar hemisphere consistent w/ acute/subacute infarct CXR 10/8 >> Chronic atelectasis, stable with last CXR  Micro Data:  MRSA > neg COVID-19 > neg Influenza A/B > neg  Antimicrobials:  Rocephin 10/5 -  Doxycycline 10/5 -   Interim history/subjective:  She continue to receive intermittent doses of Ativan overnight for agitation.  We were not able to use Precedex due to relative bradycardia.  Family feels that she is substantially more interactive today, I cannot obtain any significant history from her she replies only with execrations.    Objective   Blood pressure (!) 155/84, pulse (!) 40, temperature 98 F (36.7 C), temperature source Axillary, resp. rate (!) 39, height 5\' 3"  (1.6 m), weight 92 kg, SpO2 98 %.   She is much more alert and interactive today producing some appropriate speech when talking with family.  Pupils are equal, EOMs are full, she is moving all fours. She has no JVD respirations are unlabored there is symmetric air movement no wheezes S1 and S2 are regular without murmur rub or gallop The abdomen is obese and soft without masses tenderness guarding or rebound Limbs are warm without edema        Intake/Output Summary (Last 24 hours)  at 11/26/2019 1330 Last data filed at 11/26/2019 0400 Gross per 24 hour  Intake 158.42 ml  Output 900 ml  Net -741.58 ml   Filed Weights   11/23/19 0500 11/25/19 0500 11/26/19 0500  Weight: 90.2 kg 91.4 kg 92 kg    Resolved Hospital Problem list      Assessment & Plan:   # Acute metabolic encephalopathy d/t alcohol withdrawal - Per chart review, patient agitated, tremulous, and tachycardic on arrival - Acute worsening of agitation over first 72 hours of admission, including refusing oral medications, swearing at staff, and hallucinating requiring four-point restraints  -Because of bradycardia we have not been able to use Precedex to control her delirium, I have given her a single dose of Librium this afternoon and will continue to titrate that dose up relying on a long-acting agent to self taper over the next day.  As needed doses of Ativan will still be available.  # Acute/subacute left cerebellar hemisphere infarct - Left hand weakness as primary presentation concerning for infarct.   - Neurology on board; appreciate their recommendations  - BP control w/ SBP goal <140 - C/w ASA 325, Atorvastatin 80 - Frequent neuro checks -A CTA of the head and neck is pending, I have ordered a saline bolus and maintenance saline IV in order to avoid a renal insult secondary to the dye load.   # CAP - No oxygen requirements; remained afebrile.  - Continue with Rocephin, doxycycline (day 4/5)  # NSTEMI - Markedly elevated troponins on arrival with a maximum of 4910.  - TTE 10/5 w/ EF 60-65%, no WMA - Cardiology consulted and feels likely due to cocaine related ischemia with possible myocarditis; recommended de-escalation of Lovenox to ppx dose only, continuation of aspirin and high intensity statin. Avoid non-selective beta-blockers, but okay to continue with Carvedilol at this time. Recommended outpatient follow up   # Acute on chronic stage III kidney disease - Baseline ~ 1.3 - 1.6  - Creatinine improved yesterday back to baseline.  - BMP for this morning still pending  - Avoid nephrotoxins  # HTN - SBP goal <140 - Current regimen includes Carvedilol 12.5 mg BID, Imdur 120 mg daily, PRN Hydralazine - Required two doses of Hydralazine  overnight, likely due to missing her anti-HTNs yesterday. She did take them today, so will wait to make any dosage changes.  - Continue with current regimen.   # Macrocytic anemia - Most likely vitamin deficiency 2/2 alcohol use - Check B12, folate  # HIV - Last CD4+ 410 2019 - Re-checking CD4+ in setting of acute encephalopathy - Continue Triumeq 1 qd  # Pre-diabetes - A1c 6.1 on admission - C/w SSI - CBG monitoring  Best practice:  Diet: NPO except w/ meds DVT prophylaxis: Lovenox GI prophylaxis: n/a Glucose control: SSI Mobility: Bedrest Code Status: Full Family Communication: Daughter primary contact Disposition: ICU  Labs   CBC: Recent Labs  Lab 11/21/19 0253 11/21/19 0604 11/22/19 0828 11/23/19 0104 11/24/19 0034 11/25/19 1319 11/26/19 0256  WBC 15.5*   < > 8.3 7.9 11.0* 6.0 10.1  NEUTROABS 13.0*  --   --  5.2 7.0 3.8 7.2  HGB 11.4*   < > 10.1* 9.6* 10.7* 12.1 11.3*  HCT 34.8*   < > 32.0* 29.8* 33.2* 36.4 35.3*  MCV 102.1*   < > 103.6* 103.8* 103.1* 98.9 99.4  PLT 238   < > 208 PLATELET CLUMPS NOTED ON SMEAR, COUNT APPEARS ADEQUATE 232 PLATELET CLUMPS NOTED ON SMEAR,  COUNT APPEARS DECREASED 273   < > = values in this interval not displayed.    Basic Metabolic Panel: Recent Labs  Lab 11/21/19 0604 11/21/19 0604 11/22/19 0828 11/23/19 0104 11/24/19 0034 11/25/19 1319 11/26/19 0256  NA 139   < > 139 140 141 141 142  K 3.7   < > 3.7 4.1 3.9 4.2 3.4*  CL 109   < > 107 109 112* 110 111  CO2 18*   < > 19* 18* 18* 16* 18*  GLUCOSE 180*   < > 137* 115* 111* 96 86  BUN 37*   < > 18 13 10 15 14   CREATININE 2.04*   < > 1.49* 1.42* 1.24* 1.19* 1.22*  CALCIUM 8.5*   < > 8.3* 8.7* 9.0 9.5 9.5  MG 2.2  --   --   --   --   --   --   PHOS 2.8  --   --   --   --   --   --    < > = values in this interval not displayed.   GFR: Estimated Creatinine Clearance: 48.8 mL/min (A) (by C-G formula based on SCr of 1.22 mg/dL (H)). Recent Labs  Lab 11/21/19 0604  11/22/19 0828 11/23/19 0104 11/24/19 0034 11/25/19 1319 11/26/19 0256  PROCALCITON <0.10  --   --   --   --   --   WBC 14.1*   < > 7.9 11.0* 6.0 10.1   < > = values in this interval not displayed.    Liver Function Tests: Recent Labs  Lab 11/21/19 0253 11/21/19 0604 11/23/19 0104 11/24/19 0034 11/25/19 1319  AST 37 36 28 20 37  ALT 23 22 14 14 14   ALKPHOS 68 63 57 57 58  BILITOT 0.7 0.5 0.7 0.3 1.0  PROT 8.7* 7.6 6.7 7.2 7.3  ALBUMIN 3.8 3.5 2.9* 2.9* 3.0*   No results for input(s): LIPASE, AMYLASE in the last 168 hours. Recent Labs  Lab 11/23/19 0104  AMMONIA 46*    ABG    Component Value Date/Time   PHART 7.651 (HH) 12/31/2006 1807   PCO2ART 14.3 (LL) 12/31/2006 1807   HCO3 15.8 (L) 12/31/2006 1807   TCO2 16 12/31/2006 1807   ACIDBASEDEF 5.0 (H) 12/31/2006 1807     Coagulation Profile: Recent Labs  Lab 11/21/19 0403  INR 1.0    Cardiac Enzymes: No results for input(s): CKTOTAL, CKMB, CKMBINDEX, TROPONINI in the last 168 hours.  HbA1C: Hgb A1c MFr Bld  Date/Time Value Ref Range Status  11/21/2019 06:04 AM 6.2 (H) 4.8 - 5.6 % Final    Comment:    (NOTE) Pre diabetes:          5.7%-6.4%  Diabetes:              >6.4%  Glycemic control for   <7.0% adults with diabetes     CBG: Recent Labs  Lab 11/25/19 2000 11/25/19 2352 11/26/19 0358 11/26/19 0825 11/26/19 1136  GLUCAP 82 78 107* 96 109*    Review of Systems:   Unable to assess 2/2 AMS  Past Medical History  She,  has a past medical history of Anxiety, Bronchitis, Depression, Excessive cerumen in both ear canals (07/02/2017), H/O drug abuse ([place]), Hepatitis, History of suicidal ideation, HIV infection ([place]), Hypertension, Neuropathy, Sleep difficulties, and Trichomonal infection.   Surgical History    Past Surgical History:  Procedure Laterality Date  . COLPOSCOPY  2010     Social History  reports that she quit smoking about 19 years ago. She has never used smokeless tobacco.  She reports current alcohol use. She reports that she does not use drugs.   Family History   Her family history includes Alcohol abuse in her father; Cancer in her maternal aunt, sister, and sister.   Allergies Allergies  Allergen Reactions  . Benadryl [Diphenhydramine Hcl] Hypertension  . Ribavirin Rash  . Viekira Pak [Ombitasvir-Paritaprevir-Ritonavir-Dasabuvir] Rash    Hives like reaction     Home Medications  Prior to Admission medications   Medication Sig Start Date End Date Taking? Authorizing Provider  amLODipine (NORVASC) 5 MG tablet TAKE ONE TABLET BY MOUTH DAILY Patient taking differently: Take 5 mg by mouth daily.  07/12/19  Yes Oda Kilts, MD  divalproex (DEPAKOTE ER) 500 MG 24 hr tablet TAKE ONE TABLET BY MOUTH DAILY Patient taking differently: Take 500 mg by mouth daily.  07/14/19  Yes Axel Filler, MD  gabapentin (NEURONTIN) 300 MG capsule TAKE TWO CAPSULES BY MOUTH TWICE A DAY Patient taking differently: Take 300 mg by mouth 2 (two) times daily. TAKE TWO CAPSULES BY MOUTH TWICE A DAY 07/12/19  Yes Oda Kilts, MD  lisinopril-hydrochlorothiazide (ZESTORETIC) 20-25 MG tablet TAKE ONE TABLET BY MOUTH DAILY Patient taking differently: Take 1 tablet by mouth daily.  07/12/19  Yes Oda Kilts, MD  Multiple Vitamin (MULTIVITAMIN WITH MINERALS) TABS tablet Take 1 tablet by mouth daily.   Yes [provider]  [place] 600-50-300 MG tablet TAKE ONE TABLET BY MOUTH DAILY Patient taking differently: Take 1 tablet by mouth daily.  10/10/19  Yes Campbell Riches, MD  aluminum sulfate-calcium acetate [facility]) packet Apply 1 packet topically 3 (three) times daily. Patient not taking: Reported on 04/30/2019 01/06/19   Ok Edwards, PA-C     Lars Masson, MD  11/26/2019, 1:30 PM

## 2019-11-26 NOTE — Progress Notes (Signed)
STROKE TEAM PROGRESS NOTE      INTERVAL HISTORY Patient is restless in bed.  She is on intermittent sedation for delirium tremens.  She is tachycardic and hypertensive... Not following commands. Responds to painful stimuli.Lipid profile shows LDL cholesterol 148 mg percent, triglycerides 219 and total cholesterol 227 mg percent.   OBJECTIVE Vitals:   11/26/19 0400 11/26/19 0500 11/26/19 0600 11/26/19 0700  BP: (!) 157/87 (!) 146/66 (!) 153/81   Pulse: (!) 115 93 (!) 117 (!) 115  Resp: (!) 23 (!) 26 18 (!) 22  Temp:      TempSrc: Axillary     SpO2: 96% 96% 97% 94%  Weight:  92 kg    Height:        CBC:  Recent Labs  Lab 11/25/19 1319 11/26/19 0256  WBC 6.0 10.1  NEUTROABS 3.8 7.2  HGB 12.1 11.3*  HCT 36.4 35.3*  MCV 98.9 99.4  PLT PLATELET CLUMPS NOTED ON SMEAR, COUNT APPEARS DECREASED 097    Basic Metabolic Panel:  Recent Labs  Lab 11/21/19 0604 11/22/19 0828 11/25/19 1319 11/26/19 0256  NA 139   < > 141 142  K 3.7   < > 4.2 3.4*  CL 109   < > 110 111  CO2 18*   < > 16* 18*  GLUCOSE 180*   < > 96 86  BUN 37*   < > 15 14  CREATININE 2.04*   < > 1.19* 1.22*  CALCIUM 8.5*   < > 9.5 9.5  MG 2.2  --   --   --   PHOS 2.8  --   --   --    < > = values in this interval not displayed.    Lipid Panel:     Component Value Date/Time   CHOL 227 (H) 11/25/2019 1319   CHOL 265 (H) 02/19/2015 1423   TRIG 219 (H) 11/25/2019 1319   HDL 35 (L) 11/25/2019 1319   HDL 58 02/19/2015 1423   CHOLHDL 6.5 11/25/2019 1319   VLDL 44 (H) 11/25/2019 1319   LDLCALC 148 (H) 11/25/2019 1319   LDLCALC 146 (H) 03/04/2017 1000   HgbA1c:  Lab Results  Component Value Date   HGBA1C 6.2 (H) 11/21/2019   Urine Drug Screen:     Component Value Date/Time   LABOPIA NONE DETECTED 11/23/2019 1629   COCAINSCRNUR POSITIVE (A) 11/23/2019 1629   LABBENZ POSITIVE (A) 11/23/2019 1629   AMPHETMU NONE DETECTED 11/23/2019 1629   THCU NONE DETECTED 11/23/2019 1629   LABBARB NONE DETECTED  11/23/2019 1629    Alcohol Level     Component Value Date/Time   ETH <10 11/21/2019 0604    IMAGING   MR BRAIN WO CONTRAST  Result Date: 11/24/2019 CLINICAL DATA:  Stroke, follow-up. EXAM: MRI HEAD WITHOUT CONTRAST TECHNIQUE: Multiplanar, multiecho pulse sequences of the brain and surrounding structures were obtained without intravenous contrast. COMPARISON:  Head CT November 21, 2019. FINDINGS: The study is severely degraded by motion, most images are nondiagnostic. Brain: Focus of restricted diffusion within the left cerebellar hemisphere consistent with acute/subacute. There is hyperintensity the diffusion weighted sequence diffusely the right cerebellar hemisphere cortex and right temporoparietal and occipital cortex with no corresponding hyperintensity on the T2 sequence, may be artifactual. Hazy T2 hyperintensity within the white matter of the cerebral hemispheres, nonspecific. No large hemorrhage hydrocephalus, large extra-axial collection mass lesion. Vascular: Normal flow voids. Skull and upper cervical spine: Grossly unremarkable. Sinuses/Orbits: Grossly unremarkable. Other: None. IMPRESSION: 1. Severely motion  degraded study, most images are nondiagnostic. 2. Focus of restricted diffusion within the left cerebellar hemisphere consistent with acute/subacute infarct. 3. Hyperintensity on diffusion weighted sequence diffusely in the right cerebellar hemisphere cortex and right temporoparietal and occipital cortex, likely artifactual. These results will be called to the ordering clinician or representative by the Radiologist Assistant, and communication documented in the PACS or [employer]. Electronically Signed   By: Pedro Earls M.D.   On: 11/24/2019 10:27     Transthoracic Echocardiogram  11/21/2019 1. Mild intracavitary gradient. Peak velocity 1.78 m/s. Peak gradient  12.6 mmHg. Left ventricular ejection fraction, by estimation, is 60 to  65%. The left ventricle  has normal function. The left ventricle has no  regional wall motion abnormalities. There  is mild concentric left ventricular hypertrophy. Left ventricular  diastolic parameters are consistent with Grade I diastolic dysfunction  (impaired relaxation). Elevated left ventricular end-diastolic pressure.  2. Right ventricular systolic function is normal. The right ventricular  size is normal. There is normal pulmonary artery systolic pressure.  3. The mitral valve is normal in structure. No evidence of mitral valve  regurgitation. No evidence of mitral stenosis.  4. The aortic valve is normal in structure. Aortic valve regurgitation is  not visualized. No aortic stenosis is present.  5. The inferior vena cava is normal in size with <50% respiratory  variability, suggesting right atrial pressure of 8 mmHg.      ECG - SR rate 90 BPM. (See cardiology reading for complete details)   PHYSICAL EXAM Blood pressure (!) 153/81, pulse (!) 115, temperature 98.8 F (37.1 C), temperature source Axillary, resp. rate (!) 22, height 5\' 3"  (1.6 m), weight 92 kg, SpO2 94 %. Middle-aged African-American lady who is sedated. . Afebrile. Head is nontraumatic. Neck is supple without bruit.    Cardiac exam no murmur or gallop. Lungs are clear to auscultation. Distal pulses are well felt. Neurological Exam ;  Sedated, lethargic.  Barely opens eyes.  Mutters few sounds.  Does not follow commands..eye movements full without nystagmus.fundi were not visualized. Vision acuity and fields and hearing cannot be reliably tested.  l. Face symmetric. Tongue midline. Normal strength, tone, reflexes and withdraws all 4 extremities equally well to stimuli.  Normal sensation. Gait deferred.            ASSESSMENT/PLAN Sharon Norris is a 64 y.o. female with history of neuropathy, hepatitis C, HIV, hypertension, anxiety, depression, ETOH abuse, substance abuse and chronic kidney disease stage IV who presented to  the emergency department with chest pain where she was noted to have left sided weakness.  She became extremely agitated and UDS was found to be positive for cocaine. Later she was noted to have DTs.  She did not receive IV t-PA due to unknown time of onset.    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    Resultant altered mental status due to alcohol and drug withdrawal  Code Stroke CT Head - not ordered  CT head - not ordered  MRI head - Severely motion degraded study, most images are nondiagnostic. Focus of restricted diffusion within the left cerebellar hemisphere consistent with acute/subacute infarct. Hyperintensity on diffusion weighted sequence diffusely in the right cerebellar hemisphere cortex and right temporoparietal and occipital cortex, likely artifactual.  MRA head - not ordered  CTA H&N - pending  CT Perfusion - not ordered  Carotid Doppler - CTA neck ordered - carotid dopplers not indicated.   2D  Echo - EF 60 - 65%. No cardiac source of emboli identified.   Bilateral Lower Extremity Venous Dopplers - negative for DVT  Lacey Jensen Virus 2 - negative  LDL - pending  HgbA1c - 6.2  UDS positive for cocaine and - benzodiazepine  VTE prophylaxis - Lovenox Diet  Diet Order            Diet NPO time specified Except for: Sips with Meds  Diet effective now                 No antithrombotic prior to admission, now on aspirin 325 mg daily  Patient counseled to be compliant with her antithrombotic medications  Ongoing aggressive stroke risk factor management  Therapy recommendations:  pending  Disposition:  Pending  Hypertension  Home BP meds: amlodipine ; lisinopril ; HCTZ  Current BP meds: Coreg ; isosorbide  Stable . Permissive hypertension (OK if < 220/120) but gradually normalize in 5-7 days  . Long-term BP goal normotensive  Hyperlipidemia  Home Lipid lowering medication: none   LDL pending, goal <  70  Current lipid lowering medication: Lipitor 80 mg daily   Continue statin at discharge  Other Stroke Risk Factors  Advanced age  Former cigarette smoker - quit  ETOH abuse - will be advised to abstain.   Obesity, Body mass index is 35.93 kg/m., recommend weight loss, diet and exercise as appropriate   Substance Abuse  Other Active Problems  Code status - Full code  HIV  HEP C  CKD - creatinine - 1.24  Anemia - Hgb - 10.7  Mild leukocytosis - WBC's - 11.0 (afebrile)  Substance abuse  ETOH - DTs   Hospital day # 5 She remains agitated due to alcohol and substance abuse withdrawal.  Continue management of this as per critical care team.  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  Recommend check CT angiogram of brain and neck,  Aspirin for stroke prevention.  Continue ongoing management for alcohol withdrawal as per critical care team.  Discussed with Dr. Marolyn Hammock critical care medicine. This patient is critically ill and at significant risk of neurological worsening, death and care requires constant monitoring of vital signs, hemodynamics,respiratory and cardiac monitoring, extensive review of multiple databases, frequent neurological assessment, discussion with family, other specialists and medical decision making of high complexity.I have made any additions or clarifications directly to the above note.This critical care time does not reflect procedure time, or teaching time or supervisory time of PA/NP/Med Resident etc but could involve care discussion time.  I spent 30 minutes of neurocritical care time  in the care of  this patient.   Antony Contras, MD   To contact Stroke Continuity provider, please refer to [email or web address] After hours, contact General Neurology

## 2019-11-27 DIAGNOSIS — I6340 Cerebral infarction due to embolism of unspecified cerebral artery: Secondary | ICD-10-CM | POA: Insufficient documentation

## 2019-11-27 LAB — GLUCOSE, CAPILLARY
Glucose-Capillary: 100 mg/dL — ABNORMAL HIGH (ref 70–99)
Glucose-Capillary: 108 mg/dL — ABNORMAL HIGH (ref 70–99)
Glucose-Capillary: 112 mg/dL — ABNORMAL HIGH (ref 70–99)
Glucose-Capillary: 158 mg/dL — ABNORMAL HIGH (ref 70–99)
Glucose-Capillary: 198 mg/dL — ABNORMAL HIGH (ref 70–99)
Glucose-Capillary: 94 mg/dL (ref 70–99)

## 2019-11-27 MED ORDER — CARVEDILOL 25 MG PO TABS
25.0000 mg | ORAL_TABLET | Freq: Two times a day (BID) | ORAL | Status: DC
  Administered 2019-11-27 – 2019-11-30 (×6): 25 mg via ORAL
  Filled 2019-11-27: qty 2
  Filled 2019-11-27: qty 1
  Filled 2019-11-27: qty 2
  Filled 2019-11-27: qty 1
  Filled 2019-11-27: qty 2
  Filled 2019-11-27: qty 1

## 2019-11-27 MED ORDER — LACTULOSE 10 GM/15ML PO SOLN
20.0000 g | Freq: Three times a day (TID) | ORAL | Status: DC
  Administered 2019-11-27 – 2019-11-28 (×5): 20 g via ORAL
  Filled 2019-11-27 (×7): qty 30

## 2019-11-27 NOTE — Progress Notes (Addendum)
PROGRESS NOTE    Sharon Norris  WCH:[id number] DOB: [birth year] DOA: 11/21/2019 PCP: [name], MD followed by internal medicine teaching service  Brief Narrative:  64 WF known history of HIV followed by infectious disease Dr. Johnnye Sima Prior hep C Prior drug abuse Prior admission 05/25/2013-05/27/2013 noncardiac chest pain Seen for video visit 11/01/2019 and was doing fair at this time Admitted 11/21/2019 with left-sided numbness pins-and-needles burning in chest-EMS noted patient extremely anxious-EKG sinus tach rate 120 CXR atelectasis?  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 on MRI 10/8 Because of agitation and delirium difficult to control probably related to multifactorial reasons in addition to hepatitis possible cirrhosis and illicit drug use critical care was consulted to evaluate for need for Precedex on 10//8  Patient was transferred to critical care service 10/8 however was not able to use Precedex secondary to significant bradycardia and was transferred back to hospitalist service on 10/11  Assessment & Plan:   Principal Problem:   NSTEMI (non-ST elevated myocardial infarction) ([place]) Active Problems:   Human immunodeficiency virus (HIV) disease ([place])   Essential hypertension   CKD (chronic kidney disease), stage IV (HCC)   SIRS (systemic inflammatory response syndrome) (HCC)   Numbness on left side   Prolonged QT interval   Alcohol withdrawal ([place])   Hyperglycemia  1. Toxic metabolic encephalopathy with worsening CIWA scales 2. Hep C? Cirrhosis??  Encephalopathy related to this versus alcohol or other illicits a. Urine drug screen positive for cocaine b. More  redirectable c. Admitted to ICU 10/8 through 10/11 but transferred back out d. Encephalopathy is however not completely cleared she is intermittently confused and is asking to speak to her parents although she can tell me she is in [place] she does not know where exactly e. Continue CIWA protocol--CIwa 8-13 f. Have changed the patient to lactulose 20 g 3 times daily and we will continue to monitor mentation and hope for improvement g. Continue triumeq 1 tablet daily for hepatitis C 3. NSTEMI versus myocarditis?  Other causes a. Earlier had poor IV access therefore did change heparin to Lovenox b. Echo shows HFpEF but no WM anomaly-cardiology signed off-can be explained by cocaine use c. Appreciate input 4. ?  New stroke cerebellar region a. Neurology is recommending MRA head start aspirin 325 atorvastatin 80 and further work-up b. It is difficult to get her to take meds we will attempt the same 5. ?  Pneumonia on admission a. Received IV ceftriaxone doxycycline until 10/8 b. Finish 5 days therapy no further fevers today. 6. HTN a. Increase Coreg from 12.5-25 twice daily continue Imdur 120 daily b. Can use as needed hydralazine 10 mg every 4 as needed for pressure above 160 7. Acute superimposed on chronic CKD 3 a. Repeat labs a.m. none none today b. Saline lock fluids 50 cc/h 8. Prolonged QTC 9. HIV a. Continue triumeq 1 tablet daily-needs outpatient follow-up with infectious disease 10. A1c 6.1 with obesity 11. BMI 35 a. Outpatient characterization follow  DVT prophylaxis:  Code Status:  Family Communication:  Disposition:   Status is: Inpatient  Remains inpatient appropriate because:Hemodynamically unstable, Altered mental status, Ongoing diagnostic testing needed not appropriate for outpatient work up and IV treatments appropriate due to intensity of illness or  inability to take PO   Dispo: The patient is from: Home              Anticipated d/c is to: SNF               Anticipated d/c date is: 2 days              Patient currently is not medically stable to d/c.   Consultants:   Cardiology  Procedures: Duplex lower extremities 10/5 Echo 10/5  Antimicrobials: Ceftriaxone doxycycline stopped on 10/8   Subjective:  Somewhat more redirectable, however asks for her parents and becomes suspicious of nursing and becomes somewhat cantankerous at times No other issues other than high blood pressure  Objective: Vitals:   11/27/19 0700 11/27/19 0800 11/27/19 0900 11/27/19 1000  BP: (!) 196/92 (!) 178/94 (!) 198/85 (!) 106/58  Pulse: (!) 111 (!) 107 (!) 104 (!) 107  Resp: 18 (!) 30 (!) 22 19  Temp:  100.2 F (37.9 C)    TempSrc:  Oral    SpO2: 100% 99% 99% 98%  Weight:      Height:        Intake/Output Summary (Last 24 hours) at 11/27/2019 1050 Last data filed at 11/27/2019 0700 Gross per 24 hour  Intake 1050 ml  Output 950 ml  Net 100 ml   Filed Weights   11/25/19 0500 11/26/19 0500 11/27/19 0500  Weight: 91.4 kg 92 kg 92 kg    Examination:  General exam: More coherent asking for family members specifically parents Respiratory system: Chest clear no rales rhonchi no added sound Cardiovascular system: PVCs on monitors S1-S2 Gastrointestinal system: soft nd nt no rebound. Central nervous system: Less confused moving around is in restraints however Psych she seems to be slightly delirious seems to understand however she has hepatitis C can tell me she is in [place]: I have personally reviewed following labs and imaging studies  Potassium 3.4 BUNs/creatinine 14/1.22 WBC 10.1 Hemoglobin 11.3 Platelets 273  Radiology Studies: CT ANGIO HEAD W OR WO CONTRAST  Result Date: 11/26/2019 CLINICAL DATA:  Stroke, follow-up. Patient presented with left-sided weakness 5 days ago. Left cerebellar infarct on MRI. EXAM: CT ANGIOGRAPHY HEAD AND NECK TECHNIQUE: Multidetector CT imaging of the head and neck was performed using the  standard protocol during bolus administration of intravenous contrast. Multiplanar CT image reconstructions and MIPs were obtained to evaluate the vascular anatomy. Carotid stenosis measurements (when applicable) are obtained utilizing NASCET criteria, using the distal internal carotid diameter as the denominator. CONTRAST:  30mL OMNIPAQUE IOHEXOL 350 MG/ML SOLN COMPARISON:  None. FINDINGS: CT HEAD FINDINGS Brain: The acute/subacute posteromedial left cerebellar infarct is again seen. Remote lacunar infarcts are noted within the basal ganglia. No acute cortical infarcts are present. No progression is evident. Ventricles are of normal size. Insert pass fluid Vascular: Atherosclerotic calcifications are present within the cavernous internal carotid arteries bilaterally. No hyperdense vessel is present. Skull: Calvarium is intact. No focal lytic or blastic lesions are present. No significant extracranial soft tissue lesion is present. Sinuses: The paranasal sinuses and mastoid air cells are clear. Orbits: The globes and orbits are within normal limits. Review of the MIP images confirms the above findings CTA NECK FINDINGS Aortic arch: Atherosclerotic calcifications are present within the aortic arch and at the origin of the innominate artery and left subclavian artery. No significant stenosis or aneurysm is present. Right carotid system: Although the study is mildly degraded by patient motion. The right common  carotid artery is within normal limits. Atherosclerotic calcification is present at the right bifurcation without significant stenosis. Mild tortuosity is present cervical right ICA without significant stenosis. Left carotid system: The left common carotid artery is distorted by motion. Atherosclerotic calcifications are present at the bifurcation without significant stenosis. Cervical left ICA is within normal limits. Vertebral arteries: Right vertebral artery is the dominant vessel. Left vertebral artery is  hypoplastic and incompletely visualized due to patient motion. No significant stenosis is present on the right. Distal left vertebral artery is within normal limits. Skeleton: Degenerative changes cervical spine are most pronounced at C5-6 and to lesser degree at C6-7. No focal lytic or blastic lesions are present. No significant listhesis is present. There is straightening of the normal cervical lordosis. Other neck: Soft tissues the neck are otherwise unremarkable. Upper chest: The lung apices are clear. Thoracic inlet is within normal limits. Review of the MIP images confirms the above findings CTA HEAD FINDINGS Anterior circulation: Atherosclerotic calcifications are present within the cavernous internal carotid arteries bilaterally. Narrowing of approximately 50% is present in the proximal right cavernous ICA. The ICA termini are within normal limits bilaterally. The A1 and M1 segments are normal. The anterior communicating artery is patent. Moderate stenosis is present in the proximal superior left M2 segment. A high-grade stenosis is present in the anterior right A2 segment. Segmental narrowing is present throughout the distal MCA branches bilaterally. Posterior circulation: The right vertebral artery is dominant. PICA origins are visualized and within normal limits. Vertebrobasilar junction is normal. The basilar artery is normal. Both posterior cerebral arteries originate from the basilar tip. PCA branch vessels are within normal limits. Distal PCA segmental narrowing is present without a significant proximal stenosis or occlusion. Venous sinuses: The dural sinuses are patent. The straight sinus and deep cerebral veins are intact. Cortical veins are unremarkable. Anatomic variants: None Review of the MIP images confirms the above findings IMPRESSION: 1. Stable appearance of acute/subacute posteromedial left cerebellar infarct. 2. Remote lacunar infarcts of the basal ganglia. 3. No acute cortical infarct. 4.  Moderate stenosis of the proximal superior left M2 segment. 5. High-grade stenosis of the anterior right A2 segment. 6. Segmental narrowing throughout the distal MCA branches bilaterally. 7. Distal small vessel disease without a significant proximal stenosis, aneurysm, or branch vessel occlusion within the Circle of Willis. 8. Atherosclerotic changes at the carotid bifurcations bilaterally without significant stenosis. 9. Hypoplastic left vertebral artery is incompletely visualized due to patient motion. 10. Aortic Atherosclerosis (ICD10-I70.0). Electronically Signed   By: San Morelle M.D.   On: 11/26/2019 15:32   CT ANGIO NECK W OR WO CONTRAST  Result Date: 11/26/2019 CLINICAL DATA:  Stroke, follow-up. Patient presented with left-sided weakness 5 days ago. Left cerebellar infarct on MRI. EXAM: CT ANGIOGRAPHY HEAD AND NECK TECHNIQUE: Multidetector CT imaging of the head and neck was performed using the standard protocol during bolus administration of intravenous contrast. Multiplanar CT image reconstructions and MIPs were obtained to evaluate the vascular anatomy. Carotid stenosis measurements (when applicable) are obtained utilizing NASCET criteria, using the distal internal carotid diameter as the denominator. CONTRAST:  33mL OMNIPAQUE IOHEXOL 350 MG/ML SOLN COMPARISON:  None. FINDINGS: CT HEAD FINDINGS Brain: The acute/subacute posteromedial left cerebellar infarct is again seen. Remote lacunar infarcts are noted within the basal ganglia. No acute cortical infarcts are present. No progression is evident. Ventricles are of normal size. Insert pass fluid Vascular: Atherosclerotic calcifications are present within the cavernous internal carotid arteries bilaterally. No hyperdense vessel is  present. Skull: Calvarium is intact. No focal lytic or blastic lesions are present. No significant extracranial soft tissue lesion is present. Sinuses: The paranasal sinuses and mastoid air cells are clear. Orbits:  The globes and orbits are within normal limits. Review of the MIP images confirms the above findings CTA NECK FINDINGS Aortic arch: Atherosclerotic calcifications are present within the aortic arch and at the origin of the innominate artery and left subclavian artery. No significant stenosis or aneurysm is present. Right carotid system: Although the study is mildly degraded by patient motion. The right common carotid artery is within normal limits. Atherosclerotic calcification is present at the right bifurcation without significant stenosis. Mild tortuosity is present cervical right ICA without significant stenosis. Left carotid system: The left common carotid artery is distorted by motion. Atherosclerotic calcifications are present at the bifurcation without significant stenosis. Cervical left ICA is within normal limits. Vertebral arteries: Right vertebral artery is the dominant vessel. Left vertebral artery is hypoplastic and incompletely visualized due to patient motion. No significant stenosis is present on the right. Distal left vertebral artery is within normal limits. Skeleton: Degenerative changes cervical spine are most pronounced at C5-6 and to lesser degree at C6-7. No focal lytic or blastic lesions are present. No significant listhesis is present. There is straightening of the normal cervical lordosis. Other neck: Soft tissues the neck are otherwise unremarkable. Upper chest: The lung apices are clear. Thoracic inlet is within normal limits. Review of the MIP images confirms the above findings CTA HEAD FINDINGS Anterior circulation: Atherosclerotic calcifications are present within the cavernous internal carotid arteries bilaterally. Narrowing of approximately 50% is present in the proximal right cavernous ICA. The ICA termini are within normal limits bilaterally. The A1 and M1 segments are normal. The anterior communicating artery is patent. Moderate stenosis is present in the proximal superior left  M2 segment. A high-grade stenosis is present in the anterior right A2 segment. Segmental narrowing is present throughout the distal MCA branches bilaterally. Posterior circulation: The right vertebral artery is dominant. PICA origins are visualized and within normal limits. Vertebrobasilar junction is normal. The basilar artery is normal. Both posterior cerebral arteries originate from the basilar tip. PCA branch vessels are within normal limits. Distal PCA segmental narrowing is present without a significant proximal stenosis or occlusion. Venous sinuses: The dural sinuses are patent. The straight sinus and deep cerebral veins are intact. Cortical veins are unremarkable. Anatomic variants: None Review of the MIP images confirms the above findings IMPRESSION: 1. Stable appearance of acute/subacute posteromedial left cerebellar infarct. 2. Remote lacunar infarcts of the basal ganglia. 3. No acute cortical infarct. 4. Moderate stenosis of the proximal superior left M2 segment. 5. High-grade stenosis of the anterior right A2 segment. 6. Segmental narrowing throughout the distal MCA branches bilaterally. 7. Distal small vessel disease without a significant proximal stenosis, aneurysm, or branch vessel occlusion within the Circle of Willis. 8. Atherosclerotic changes at the carotid bifurcations bilaterally without significant stenosis. 9. Hypoplastic left vertebral artery is incompletely visualized due to patient motion. 10. Aortic Atherosclerosis (ICD10-I70.0). Electronically Signed   By: San Morelle M.D.   On: 11/26/2019 15:32     Scheduled Meds: . abacavir-dolutegravir-lamiVUDine  1 tablet Oral Daily  . aspirin  325 mg Oral Daily  . atorvastatin  80 mg Oral Daily  . carvedilol  25 mg Oral BID WC  . Chlorhexidine Gluconate Cloth  6 each Topical Daily  . enoxaparin (LOVENOX) injection  40 mg Subcutaneous Q24H  . folic acid  1 mg Intravenous Daily  . insulin aspart  0-9 Units Subcutaneous Q4H  .  isosorbide mononitrate  120 mg Oral Daily  . lactulose  20 g Oral TID  . multivitamin with minerals  1 tablet Oral Daily  . nicotine  14 mg Transdermal Daily  . potassium chloride  40 mEq Oral Once  . sodium chloride flush  10-40 mL Intracatheter Q12H  . thiamine injection  100 mg Intravenous Daily   Continuous Infusions: . sodium chloride 50 mL/hr at 11/27/19 0650     LOS: 6 days    Time spent: [place], MD [facility] To contact the attending provider between 7A-7P or the covering provider during after hours 7P-7A, please log into the web site [email or web address] and access using universal [place] password for that web site. If you do not have the password, please call the hospital operator.  11/27/2019, 10:50 AM

## 2019-11-27 NOTE — Progress Notes (Signed)
STROKE TEAM PROGRESS NOTE   INTERVAL HISTORY Patient remains confused and disoriented and intermittently agitated.  She is in restraints.  She appears to be hallucinating.  Blood pressure still remains elevated.  OBJECTIVE Vitals:   11/27/19 0400 11/27/19 0500 11/27/19 0600 11/27/19 0700  BP: (!) 180/99 (!) 182/99 (!) 184/92 (!) 196/92  Pulse: 97 (!) 106 (!) 104 (!) 111  Resp: (!) 22 (!) 27 (!) 30 18  Temp: 98.8 F (37.1 C)     TempSrc: Axillary     SpO2: 100% 100% 100% 100%  Weight:  92 kg    Height:       CBC:  Recent Labs  Lab 11/25/19 1319 11/26/19 0256  WBC 6.0 10.1  NEUTROABS 3.8 7.2  HGB 12.1 11.3*  HCT 36.4 35.3*  MCV 98.9 99.4  PLT PLATELET CLUMPS NOTED ON SMEAR, COUNT APPEARS DECREASED 591   Basic Metabolic Panel:  Recent Labs  Lab 11/21/19 0604 11/22/19 0828 11/25/19 1319 11/26/19 0256  NA 139   < > 141 142  K 3.7   < > 4.2 3.4*  CL 109   < > 110 111  CO2 18*   < > 16* 18*  GLUCOSE 180*   < > 96 86  BUN 37*   < > 15 14  CREATININE 2.04*   < > 1.19* 1.22*  CALCIUM 8.5*   < > 9.5 9.5  MG 2.2  --   --   --   PHOS 2.8  --   --   --    < > = values in this interval not displayed.   Lipid Panel:     Component Value Date/Time   CHOL 227 (H) 11/25/2019 1319   CHOL 265 (H) 02/19/2015 1423   TRIG 219 (H) 11/25/2019 1319   HDL 35 (L) 11/25/2019 1319   HDL 58 02/19/2015 1423   CHOLHDL 6.5 11/25/2019 1319   VLDL 44 (H) 11/25/2019 1319   LDLCALC 148 (H) 11/25/2019 1319   LDLCALC 146 (H) 03/04/2017 1000   HgbA1c:  Lab Results  Component Value Date   HGBA1C 6.2 (H) 11/21/2019   Urine Drug Screen:     Component Value Date/Time   LABOPIA NONE DETECTED 11/23/2019 1629   COCAINSCRNUR POSITIVE (A) 11/23/2019 1629   LABBENZ POSITIVE (A) 11/23/2019 1629   AMPHETMU NONE DETECTED 11/23/2019 1629   THCU NONE DETECTED 11/23/2019 1629   LABBARB NONE DETECTED 11/23/2019 1629    Alcohol Level     Component Value Date/Time   ETH <10 11/21/2019 0604     IMAGING CT ANGIO HEAD W OR WO CONTRAST  Result Date: 11/26/2019 CLINICAL DATA:  Stroke, follow-up. Patient presented with left-sided weakness 5 days ago. Left cerebellar infarct on MRI. EXAM: CT ANGIOGRAPHY HEAD AND NECK TECHNIQUE: Multidetector CT imaging of the head and neck was performed using the standard protocol during bolus administration of intravenous contrast. Multiplanar CT image reconstructions and MIPs were obtained to evaluate the vascular anatomy. Carotid stenosis measurements (when applicable) are obtained utilizing NASCET criteria, using the distal internal carotid diameter as the denominator. CONTRAST:  76mL OMNIPAQUE IOHEXOL 350 MG/ML SOLN COMPARISON:  None. FINDINGS: CT HEAD FINDINGS Brain: The acute/subacute posteromedial left cerebellar infarct is again seen. Remote lacunar infarcts are noted within the basal ganglia. No acute cortical infarcts are present. No progression is evident. Ventricles are of normal size. Insert pass fluid Vascular: Atherosclerotic calcifications are present within the cavernous internal carotid arteries bilaterally. No hyperdense vessel is present. Skull: Calvarium  is intact. No focal lytic or blastic lesions are present. No significant extracranial soft tissue lesion is present. Sinuses: The paranasal sinuses and mastoid air cells are clear. Orbits: The globes and orbits are within normal limits. Review of the MIP images confirms the above findings CTA NECK FINDINGS Aortic arch: Atherosclerotic calcifications are present within the aortic arch and at the origin of the innominate artery and left subclavian artery. No significant stenosis or aneurysm is present. Right carotid system: Although the study is mildly degraded by patient motion. The right common carotid artery is within normal limits. Atherosclerotic calcification is present at the right bifurcation without significant stenosis. Mild tortuosity is present cervical right ICA without significant  stenosis. Left carotid system: The left common carotid artery is distorted by motion. Atherosclerotic calcifications are present at the bifurcation without significant stenosis. Cervical left ICA is within normal limits. Vertebral arteries: Right vertebral artery is the dominant vessel. Left vertebral artery is hypoplastic and incompletely visualized due to patient motion. No significant stenosis is present on the right. Distal left vertebral artery is within normal limits. Skeleton: Degenerative changes cervical spine are most pronounced at C5-6 and to lesser degree at C6-7. No focal lytic or blastic lesions are present. No significant listhesis is present. There is straightening of the normal cervical lordosis. Other neck: Soft tissues the neck are otherwise unremarkable. Upper chest: The lung apices are clear. Thoracic inlet is within normal limits. Review of the MIP images confirms the above findings CTA HEAD FINDINGS Anterior circulation: Atherosclerotic calcifications are present within the cavernous internal carotid arteries bilaterally. Narrowing of approximately 50% is present in the proximal right cavernous ICA. The ICA termini are within normal limits bilaterally. The A1 and M1 segments are normal. The anterior communicating artery is patent. Moderate stenosis is present in the proximal superior left M2 segment. A high-grade stenosis is present in the anterior right A2 segment. Segmental narrowing is present throughout the distal MCA branches bilaterally. Posterior circulation: The right vertebral artery is dominant. PICA origins are visualized and within normal limits. Vertebrobasilar junction is normal. The basilar artery is normal. Both posterior cerebral arteries originate from the basilar tip. PCA branch vessels are within normal limits. Distal PCA segmental narrowing is present without a significant proximal stenosis or occlusion. Venous sinuses: The dural sinuses are patent. The straight sinus and  deep cerebral veins are intact. Cortical veins are unremarkable. Anatomic variants: None Review of the MIP images confirms the above findings IMPRESSION: 1. Stable appearance of acute/subacute posteromedial left cerebellar infarct. 2. Remote lacunar infarcts of the basal ganglia. 3. No acute cortical infarct. 4. Moderate stenosis of the proximal superior left M2 segment. 5. High-grade stenosis of the anterior right A2 segment. 6. Segmental narrowing throughout the distal MCA branches bilaterally. 7. Distal small vessel disease without a significant proximal stenosis, aneurysm, or branch vessel occlusion within the Circle of Willis. 8. Atherosclerotic changes at the carotid bifurcations bilaterally without significant stenosis. 9. Hypoplastic left vertebral artery is incompletely visualized due to patient motion. 10. Aortic Atherosclerosis (ICD10-I70.0). Electronically Signed   By: San Morelle M.D.   On: 11/26/2019 15:32   CT ANGIO NECK W OR WO CONTRAST  Result Date: 11/26/2019 CLINICAL DATA:  Stroke, follow-up. Patient presented with left-sided weakness 5 days ago. Left cerebellar infarct on MRI. EXAM: CT ANGIOGRAPHY HEAD AND NECK TECHNIQUE: Multidetector CT imaging of the head and neck was performed using the standard protocol during bolus administration of intravenous contrast. Multiplanar CT image reconstructions and MIPs were  obtained to evaluate the vascular anatomy. Carotid stenosis measurements (when applicable) are obtained utilizing NASCET criteria, using the distal internal carotid diameter as the denominator. CONTRAST:  53mL OMNIPAQUE IOHEXOL 350 MG/ML SOLN COMPARISON:  None. FINDINGS: CT HEAD FINDINGS Brain: The acute/subacute posteromedial left cerebellar infarct is again seen. Remote lacunar infarcts are noted within the basal ganglia. No acute cortical infarcts are present. No progression is evident. Ventricles are of normal size. Insert pass fluid Vascular: Atherosclerotic  calcifications are present within the cavernous internal carotid arteries bilaterally. No hyperdense vessel is present. Skull: Calvarium is intact. No focal lytic or blastic lesions are present. No significant extracranial soft tissue lesion is present. Sinuses: The paranasal sinuses and mastoid air cells are clear. Orbits: The globes and orbits are within normal limits. Review of the MIP images confirms the above findings CTA NECK FINDINGS Aortic arch: Atherosclerotic calcifications are present within the aortic arch and at the origin of the innominate artery and left subclavian artery. No significant stenosis or aneurysm is present. Right carotid system: Although the study is mildly degraded by patient motion. The right common carotid artery is within normal limits. Atherosclerotic calcification is present at the right bifurcation without significant stenosis. Mild tortuosity is present cervical right ICA without significant stenosis. Left carotid system: The left common carotid artery is distorted by motion. Atherosclerotic calcifications are present at the bifurcation without significant stenosis. Cervical left ICA is within normal limits. Vertebral arteries: Right vertebral artery is the dominant vessel. Left vertebral artery is hypoplastic and incompletely visualized due to patient motion. No significant stenosis is present on the right. Distal left vertebral artery is within normal limits. Skeleton: Degenerative changes cervical spine are most pronounced at C5-6 and to lesser degree at C6-7. No focal lytic or blastic lesions are present. No significant listhesis is present. There is straightening of the normal cervical lordosis. Other neck: Soft tissues the neck are otherwise unremarkable. Upper chest: The lung apices are clear. Thoracic inlet is within normal limits. Review of the MIP images confirms the above findings CTA HEAD FINDINGS Anterior circulation: Atherosclerotic calcifications are present within  the cavernous internal carotid arteries bilaterally. Narrowing of approximately 50% is present in the proximal right cavernous ICA. The ICA termini are within normal limits bilaterally. The A1 and M1 segments are normal. The anterior communicating artery is patent. Moderate stenosis is present in the proximal superior left M2 segment. A high-grade stenosis is present in the anterior right A2 segment. Segmental narrowing is present throughout the distal MCA branches bilaterally. Posterior circulation: The right vertebral artery is dominant. PICA origins are visualized and within normal limits. Vertebrobasilar junction is normal. The basilar artery is normal. Both posterior cerebral arteries originate from the basilar tip. PCA branch vessels are within normal limits. Distal PCA segmental narrowing is present without a significant proximal stenosis or occlusion. Venous sinuses: The dural sinuses are patent. The straight sinus and deep cerebral veins are intact. Cortical veins are unremarkable. Anatomic variants: None Review of the MIP images confirms the above findings IMPRESSION: 1. Stable appearance of acute/subacute posteromedial left cerebellar infarct. 2. Remote lacunar infarcts of the basal ganglia. 3. No acute cortical infarct. 4. Moderate stenosis of the proximal superior left M2 segment. 5. High-grade stenosis of the anterior right A2 segment. 6. Segmental narrowing throughout the distal MCA branches bilaterally. 7. Distal small vessel disease without a significant proximal stenosis, aneurysm, or branch vessel occlusion within the Circle of Willis. 8. Atherosclerotic changes at the carotid bifurcations bilaterally without significant  stenosis. 9. Hypoplastic left vertebral artery is incompletely visualized due to patient motion. 10. Aortic Atherosclerosis (ICD10-I70.0). Electronically Signed   By: San Morelle M.D.   On: 11/26/2019 15:32    PHYSICAL EXAM   Blood pressure (!) 196/92, pulse (!) 111,  temperature 98.8 F (37.1 C), temperature source Axillary, resp. rate 18, height 5\' 3"  (1.6 m), weight 92 kg, SpO2 100 %. Middle-aged African-American lady who is sedated. . Afebrile. Head is nontraumatic. Neck is supple without bruit.    Cardiac exam no murmur or gallop. Lungs are clear to auscultation. Distal pulses are well felt. Neurological Exam ;  She is awake and alert but disoriented confused and delirious.  She has easy distractibility and decreased attention.  Does not follow commands..eye movements full without nystagmus.fundi were not visualized. Vision acuity and fields and hearing cannot be reliably tested.  l. Face symmetric. Tongue midline. Normal strength, tone, reflexes and withdraws all 4 extremities equally well to stimuli.  Normal sensation. Gait deferred.   ASSESSMENT/PLAN Ms. Tristina Sahagian is a 64 y.o. female with history of neuropathy, hepatitis C, HIV, hypertension, anxiety, depression, ETOH abuse, substance abuse and chronic kidney disease stage IV who presented to the emergency department with chest pain where she was noted to have left sided weakness.  She became extremely agitated and UDS was found to be positive for cocaine. Later she was noted to have DTs.  She did not receive IV t-PA due to unknown time of onset.   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    MRI head - Severely motion degraded study, most images are nondiagnostic. Focus of restricted diffusion within the left cerebellar hemisphere consistent with acute/subacute infarct. Hyperintensity on diffusion weighted sequence diffusely in the right cerebellar hemisphere cortex and right temporoparietal and occipital cortex, likely artifactual.  CTA H&N - stable L cerebellar infarct. Old basal ganglia lacunes. Moderate stenosis proximal superior L M2. High-grade stenosis R A2. Small vessel disease. B ICA atherosclerosis. L VA hypoplastic. Aortic atherosclerosis.  2D Echo -  EF 60 - 65%. No cardiac source of emboli identified.   Bilateral Lower Extremity Venous Dopplers - negative for DVT  Lacey Jensen Virus 2 - negative  LDL - 148   HgbA1c - 6.2  UDS positive for cocaine and benzodiazepine  VTE prophylaxis - Lovenox  No antithrombotic prior to admission, now on aspirin 325 mg daily  Therapy recommendations:  SNF  Disposition:  Pending  For xfer to floor today  Hypertension  Home BP meds: amlodipine ; lisinopril ; HCTZ  Current BP meds: Coreg ; isosorbide  Stable on the high side at times . Permissive hypertension (OK if < 220/120) but gradually normalize in 5-7 days  . Long-term BP goal normotensive  Hyperlipidemia  Home Lipid lowering medication: none   LDL 148, goal < 70  Current lipid lowering medication: Lipitor 80 mg daily   Continue statin at discharge  Other Stroke Risk Factors  Advanced age  Former cigarette smoker - quit  ETOH abuse - advised to abstain -  DTs  Obesity, Body mass index is 35.93 kg/m., recommend weight loss, diet and exercise as appropriate   Substance Abuse + benzos, + cociane - advised to quit  HIV  Other Active Problems  HEP C  CKD - creatinine - 1.22  Anemia - Hgb - 11.3  Mild leukocytosis - WBC's - 10.1 - resolved  Hospital day # 6 Continue mobilization out of bed.  Therapy consults.  Transfer to  floor bed if okay with critical care team.  Medical hospitalist team resume care.  Continue aspirin for stroke prevention and aggressive risk factor modification.  Greater than 50% time during this 25-minute visit was spent on counseling and coordination of care and discussion with care team.  Stroke team will sign off.  Kindly call for questions.  Antony Contras, MD   To contact Stroke Continuity provider, please refer to [email or web address] After hours, contact General Neurology
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
## 2019-11-27 NOTE — Progress Notes (Signed)
[facility] Progress Note Patient Name: Klynn Linnemann DOB: 09/12/1955 MRN: [id number]   Date of Service  11/27/2019  HPI/Events of Note  Agitation - Nursing request to renew expired restraint orders.   eICU Interventions  Will renew Bilateral soft wrist, ankle and waist restraint X 8 hours.      Intervention Category Major Interventions: Delirium, psychosis, severe agitation - evaluation and management  Hellena Pridgen [place] 11/27/2019, 12:38 AM

## 2019-11-27 NOTE — TOC CAGE-AID Note (Signed)
Transition of Care [facility]) - CAGE-AID Screening   Patient Details  Name: Sharon Norris MRN: [id number] Date of Birth: Mar 14, 1955  Transition of Care [facility]) CM/SW Contact:    Emeterio Reeve, [place] Phone Number: 11/27/2019, 3:20 PM   Clinical Narrative:  Pt is unable to participate in assessment due to only being oriented to self.  CAGE-AID Screening: Substance Abuse Screening unable to be completed due to: : Patient unable to participate                  Providence [place]by Clinical Social Worker [phone]
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
## 2019-11-28 LAB — CBC WITH DIFFERENTIAL/PLATELET
Abs Immature Granulocytes: 0.03 10*3/uL (ref 0.00–0.07)
Basophils Absolute: 0 10*3/uL (ref 0.0–0.1)
Basophils Relative: 1 %
Eosinophils Absolute: 0 10*3/uL (ref 0.0–0.5)
Eosinophils Relative: 0 %
HCT: 31.2 % — ABNORMAL LOW (ref 36.0–46.0)
Hemoglobin: 9.8 g/dL — ABNORMAL LOW (ref 12.0–15.0)
Immature Granulocytes: 0 %
Lymphocytes Relative: 24 %
Lymphs Abs: 2 10*3/uL (ref 0.7–4.0)
MCH: 32.7 pg (ref 26.0–34.0)
MCHC: 31.4 g/dL (ref 30.0–36.0)
MCV: 104 fL — ABNORMAL HIGH (ref 80.0–100.0)
Monocytes Absolute: 0.8 10*3/uL (ref 0.1–1.0)
Monocytes Relative: 10 %
Neutro Abs: 5.3 10*3/uL (ref 1.7–7.7)
Neutrophils Relative %: 65 %
Platelets: 257 10*3/uL (ref 150–400)
RBC: 3 MIL/uL — ABNORMAL LOW (ref 3.87–5.11)
RDW: 13.2 % (ref 11.5–15.5)
WBC: 8.2 10*3/uL (ref 4.0–10.5)
nRBC: 0 % (ref 0.0–0.2)

## 2019-11-28 LAB — GLUCOSE, CAPILLARY
Glucose-Capillary: 122 mg/dL — ABNORMAL HIGH (ref 70–99)
Glucose-Capillary: 113 mg/dL — ABNORMAL HIGH (ref 70–99)
Glucose-Capillary: 118 mg/dL — ABNORMAL HIGH (ref 70–99)
Glucose-Capillary: 128 mg/dL — ABNORMAL HIGH (ref 70–99)
Glucose-Capillary: 109 mg/dL — ABNORMAL HIGH (ref 70–99)
Glucose-Capillary: 122 mg/dL — ABNORMAL HIGH (ref 70–99)

## 2019-11-28 LAB — COMPREHENSIVE METABOLIC PANEL
ALT: 16 U/L (ref 0–44)
AST: 23 U/L (ref 15–41)
Albumin: 2.9 g/dL — ABNORMAL LOW (ref 3.5–5.0)
Alkaline Phosphatase: 52 U/L (ref 38–126)
Anion gap: 11 (ref 5–15)
BUN: 17 mg/dL (ref 8–23)
CO2: 16 mmol/L — ABNORMAL LOW (ref 22–32)
Calcium: 9.2 mg/dL (ref 8.9–10.3)
Chloride: 116 mmol/L — ABNORMAL HIGH (ref 98–111)
Creatinine, Ser: 1.78 mg/dL — ABNORMAL HIGH (ref 0.44–1.00)
GFR, Estimated: 29 mL/min — ABNORMAL LOW (ref >60)
Glucose, Bld: 118 mg/dL — ABNORMAL HIGH (ref 70–99)
Potassium: 3.7 mmol/L (ref 3.5–5.1)
Sodium: 143 mmol/L (ref 135–145)
Total Bilirubin: 0.3 mg/dL (ref 0.3–1.2)
Total Protein: 6.9 g/dL (ref 6.5–8.1)

## 2019-11-28 LAB — T-HELPER CELLS (CD4) COUNT (NOT AT ARMC)
CD4 % Helper T Cell: 28 % — ABNORMAL LOW (ref 33–65)
CD4 T Cell Abs: 483 /uL (ref 400–1790)

## 2019-11-28 MED ORDER — ADULT MULTIVITAMIN W/MINERALS CH
1.0000 | ORAL_TABLET | Freq: Every day | ORAL | Status: DC
  Administered 2019-11-28 – 2019-11-29 (×2): 1 via ORAL
  Filled 2019-11-28 (×3): qty 1

## 2019-11-28 MED ORDER — CHLORDIAZEPOXIDE HCL 25 MG PO CAPS
25.0000 mg | ORAL_CAPSULE | Freq: Four times a day (QID) | ORAL | Status: DC | PRN
  Administered 2019-11-28 (×3): 25 mg via ORAL
  Filled 2019-11-28 (×3): qty 1

## 2019-11-28 MED ORDER — THIAMINE HCL 100 MG PO TABS
100.0000 mg | ORAL_TABLET | Freq: Every day | ORAL | Status: DC
  Administered 2019-11-29 – 2019-11-30 (×2): 100 mg via ORAL
  Filled 2019-11-28 (×2): qty 1

## 2019-11-28 MED ORDER — HYDROXYZINE HCL 25 MG PO TABS
25.0000 mg | ORAL_TABLET | Freq: Four times a day (QID) | ORAL | Status: DC | PRN
  Administered 2019-11-30: 25 mg via ORAL
  Filled 2019-11-28 (×2): qty 1

## 2019-11-28 MED ORDER — SODIUM CHLORIDE 0.9 % IV SOLN: INTRAVENOUS | Status: DC

## 2019-11-28 MED ORDER — THIAMINE HCL 100 MG/ML IJ SOLN: 100.0000 mg | Freq: Once | INTRAMUSCULAR | Status: DC

## 2019-11-28 NOTE — Progress Notes (Signed)
PROGRESS NOTE    Sharon Norris  SWH:[id number] DOB: January 02, 1956 DOA: 11/21/2019 PCP: [name], MD followed by internal medicine teaching service  Brief Narrative:  64 WF known history of HIV followed by infectious disease Dr. Johnnye Sima Prior hep C Prior drug abuse Prior admission 05/25/2013-05/27/2013 noncardiac chest pain Seen for video visit 11/01/2019 and was doing fair at this time Admitted 11/21/2019 with left-sided numbness pins-and-needles burning in chest-EMS noted patient extremely anxious-EKG sinus tach rate 120 CXR atelectasis?  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 on MRI 10/8 Because of agitation and delirium difficult to control probably related to multifactorial reasons in addition to hepatitis possible cirrhosis and illicit drug use critical care was consulted to evaluate for need for Precedex on 10//8  Patient was transferred to critical care service 10/8 however was not able to use Precedex secondary to significant bradycardia and was transferred back to hospitalist service on 10/11  She has improved slowly--unclear if imrpovement secondary to lactulose or librium She will require several days more stay to ensure stability and then SNF  Assessment & Plan:   Principal Problem:   NSTEMI (non-ST elevated myocardial infarction) ([place]) Active Problems:   Human immunodeficiency virus (HIV) disease ([place])   Essential hypertension   CKD (chronic kidney disease), stage IV ([place])   SIRS (systemic inflammatory response syndrome) ([place])   Numbness on left side   Prolonged QT interval   Alcohol withdrawal ([place])   Hyperglycemia   Cerebral embolism with cerebral  infarction  1. Toxic metabolic encephalopathy with worsening CIWA scales 2. Hep C? Cirrhosis??   a. drinks about 1/5th a da and takes Goodies b. Urine drug screen positive for cocaine c. Patient much improved d. Admitted to ICU 10/8 through 10/11 but transferred back out e. Continue CIWA protocol--with libirum f. Re-iterated lactulose 20 g 3 times daily given improvement of mentation and will need to continue this--can be cut back slightly if continues to be copherent 3. NSTEMI versus myocarditis?  Other causes a. Earlier had poor IV access therefore did change heparin to Lovenox b. Echo shows HFpEF but no WM anomaly-cardiology signed off-can be explained by cocaine use c. Appreciate input 4. ?  New stroke cerebellar region a. MRA head cancelled Neuro signe doff b. aspirin 325 atorvastatin 80 and no further work-up 5. ?  Pneumonia on admission a. Received IV ceftriaxone doxycycline until 10/8 b. Finish 5 days therapy no further fevers today. 6. HTN a. Increase Coreg from 12.5-25 twice daily continue Imdur 120 daily b. Can use as needed hydralazine 10 mg every 4 as needed for pressure above 160 7. Acute superimposed on chronic CKD 3 a. Repeat labs a.m. none none today b. cotinue IV Saline 50 cc/h c. Because of mild rise in creatinine get bladder scan and recheck am labs 8. Prolonged QTC 9. HIV a. Continue triumeq 1 tablet daily-needs outpatient follow-up with infectious disease 10. A1c 6.1 with obesity 11. BMI 35 a. Outpatient characterization follow  DVT prophylaxis: lovenox Code Status: full Family Communication: spoke with daughter on phone 11/27/19 Disposition:   Status is: Inpatient  Remains inpatient appropriate because:Hemodynamically unstable, Altered mental status, Ongoing diagnostic testing needed not appropriate for outpatient work up and IV treatments appropriate due to intensity of illness  or inability to take PO   Dispo: The patient is from: Home               Anticipated d/c is to: SNF              Anticipated d/c date is: 2 days              Patient currently is not medically stable to d/c.   Consultants:   Cardiology  Procedures: Duplex lower extremities 10/5 Echo 10/5  Antimicrobials: Ceftriaxone doxycycline stopped on 10/8   Subjective:  much more coherent--close to normal orientation and can related various histories No fever no CP nursing relates was declining use of Lactulose which I re-iterated in detAIL  Objective: Vitals:   11/28/19 0800 11/28/19 0945 11/28/19 1145 11/28/19 1200  BP:   137/72   Pulse:  86    Resp:   17   Temp: 97.6 F (36.4 C)   97.9 F (36.6 C)  TempSrc: Oral   Oral  SpO2:      Weight:      Height:        Intake/Output Summary (Last 24 hours) at 11/28/2019 1339 Last data filed at 11/28/2019 0400 Gross per 24 hour  Intake --  Output 300 ml  Net -300 ml   Filed Weights   11/25/19 0500 11/26/19 0500 11/27/19 0500  Weight: 91.4 kg 92 kg 92 kg    Examination:  General exam: awake no distress more coherent and directable Respiratory system: Chest clear no rales rhonchi no added sound Cardiovascular system: PVCs on monitors S1-S2 Gastrointestinal system: soft nd nt no rebound, no hsm Central nervous system: much less confused  Data Reviewed: I have personally reviewed following labs and imaging studies  Potassium 3.4-->3.7 BUNs/creatinine 14/1.22--17/1.78 WBC 10.1-->8.2 Hemoglobin 11.3-->9.8 Platelets 273  Radiology Studies: No results found.   Scheduled Meds: . abacavir-dolutegravir-lamiVUDine  1 tablet Oral Daily  . aspirin  325 mg Oral Daily  . atorvastatin  80 mg Oral Daily  . carvedilol  25 mg Oral BID WC  . Chlorhexidine Gluconate Cloth  6 each Topical Daily  . enoxaparin (LOVENOX) injection  40 mg Subcutaneous Q24H  . folic acid  1 mg Intravenous Daily  . insulin aspart  0-9 Units Subcutaneous Q4H  . isosorbide mononitrate  120 mg Oral Daily  . lactulose  20 g Oral  TID  . multivitamin with minerals  1 tablet Oral Daily  . nicotine  14 mg Transdermal Daily  . sodium chloride flush  10-40 mL Intracatheter Q12H  . thiamine injection  100 mg Intravenous Daily  . thiamine  100 mg Intramuscular Once  . [START ON 11/29/2019] thiamine  100 mg Oral Daily   Continuous Infusions: . sodium chloride 50 mL/hr at 11/28/19 0900     LOS: 7 days    Time spent: 25  Nita Sells, MD [facility] To contact the attending provider between 7A-7P or the covering provider during after hours 7P-7A, please log into the web site [email or web address] and access using universal [place] password for that web site. If you do not have the password, please call the hospital operator.  11/28/2019, 1:39 PM

## 2019-11-28 NOTE — Progress Notes (Signed)
Physical Therapy Treatment Patient Details Name: Sharon Norris MRN: [id number] DOB: 1955/11/09 Today's Date: 11/28/2019    History of Present Illness Sharon Norris is a 64 y.o. female with medical history significant for HIV, alcohol abuse, hypertension, remote history of IV drug abuse, and chronic kidney disease stage IIIb-IV, presenting to the emergency department for evaluation of chest pain. Pt found to have cocaine in system, NSTEMI, and acute L cerebral infarct.    PT Comments    Pt more sleepy today but cooperative. Pt remains to have inconsistent report of PLOF and living situation. Pt was able to tolerate ambulation today with RW and minax2. Pt c/o SOB and fatigue requiring seated rest breaks. Continue to recommend SNF upon d/c to achieve safe mod I level of function. Acute PT to cont to follow.    Follow Up Recommendations  SNF;Supervision/Assistance - 24 hour     Equipment Recommendations       Recommendations for Other Services       Precautions / Restrictions Precautions Precautions: Fall Restrictions Weight Bearing Restrictions: No    Mobility  Bed Mobility Overal bed mobility: Needs Assistance Bed Mobility: Supine to Sit     Supine to sit: Min assist     General bed mobility comments: HOB elevated, directional verbal cues  Transfers Overall transfer level: Needs assistance Equipment used: Rolling walker (2 wheeled) Transfers: Sit to/from Stand Sit to Stand: Mod assist         General transfer comment: mod A to power up, pt slow and guarded, verabl cues for safe hand placement and modA to steady during transition of hands from bed to RW  Ambulation/Gait Ambulation/Gait assistance: Min assist;Mod assist;+2 safety/equipment Gait Distance (Feet): 30 Feet (x1, 100x1) Assistive device: Rolling walker (2 wheeled) Gait Pattern/deviations: Step-through pattern;Decreased stride length;Trunk flexed Gait velocity: slow Gait velocity interpretation: <1.8  ft/sec, indicate of risk for recurrent falls General Gait Details: pt slow and guarded, has to stop due to c/o SOB, SPO2 >98% on RA, verbal cues to keep head up and look straight headed, with onset of fatigue pt became unsteady in bilat knees with some buckling   Stairs             Wheelchair Mobility    Modified Rankin (Stroke Patients Only) Modified Rankin (Stroke Patients Only) Pre-Morbid Rankin Score: No symptoms Modified Rankin: Moderate disability     Balance Overall balance assessment: Needs assistance Sitting-balance support: Feet supported;No upper extremity supported Sitting balance-Leahy Scale: Fair     Standing balance support: During functional activity Standing balance-Leahy Scale: Fair Standing balance comment: dependent on physical assist to maintain standing                            Cognition Arousal/Alertness: Awake/alert Behavior During Therapy: WFL for tasks assessed/performed Overall Cognitive Status: No family/caregiver present to determine baseline cognitive functioning                                 General Comments: pt continues with inconsistent report of PLOF and living situation, pt stated she lived in a 3 bedroom apt today with dtr and 25 yo granddtr then stated it was a 1 bedroom       Exercises      General Comments General comments (skin integrity, edema, etc.): pt c/o SOB during ambulation, SPO2 >98% on RA      Pertinent Vitals/Pain  Pain Assessment: No/denies pain    Home Living                      Prior Function            PT Goals (current goals can now be found in the care plan section) Progress towards PT goals: Progressing toward goals    Frequency    Min 4X/week      PT Plan Current plan remains appropriate    Co-evaluation              AM-PAC PT "6 Clicks" Mobility   Outcome Measure  Help needed turning from your back to your side while in a flat bed without  using bedrails?: A Little Help needed moving from lying on your back to sitting on the side of a flat bed without using bedrails?: A Little Help needed moving to and from a bed to a chair (including a wheelchair)?: A Little Help needed standing up from a chair using your arms (e.g., wheelchair or bedside chair)?: A Lot Help needed to walk in hospital room?: A Lot Help needed climbing 3-5 steps with a railing? : A Lot 6 Click Score: 15    End of Session Equipment Utilized During Treatment: Gait belt Activity Tolerance: Patient tolerated treatment well Patient left: in chair;with call bell/phone within reach;with chair alarm set;with nursing/sitter in room Nurse Communication: Mobility status PT Visit Diagnosis: Unsteadiness on feet (R26.81);Muscle weakness (generalized) (M62.81);Difficulty in walking, not elsewhere classified (R26.2)     Time: 3818-2993 PT Time Calculation (min) (ACUTE ONLY): 30 min  Charges:  $Gait Training: 23-37 mins                     Kittie Plater, PT, DPT Acute Rehabilitation Services Pager #: [phone] Office #: [phone]    [place] Lopes 11/28/2019, 2:20 PM

## 2019-11-28 NOTE — TOC Initial Note (Signed)
Transition of Care [facility]) - Initial/Assessment Note    Patient Details  Name: Sharon Norris MRN: [id number] Date of Birth: 16-Jul-1955  Transition of Care [facility]) CM/SW Contact:    Joanne Chars, LCSW Phone Number: 11/28/2019, 9:35 AM  Clinical Narrative:     CSW spoke with pt regarding DC recommendation for SNF.  Pt agreeable to this but would like to speak to her daughter before committing.  Permission given for CSW to speak to daughter Jonelle Sidle as well.  Choice document provided.  Pt currently lives alone but has support from daughter and a [facility] aide. (could not remember which agency)  Pt is vaccinated.  CSW spoke with daughter Jonelle Sidle who is in agreement with plan for SNF and will speak to her mother today.           Expected Discharge Plan: Skilled Nursing Facility Barriers to Discharge: Continued Medical Work up, SNF Pending bed offer   Patient Goals and CMS Choice Patient states their goals for this hospitalization and ongoing recovery are:: "walk on my own, not be so out of breath" CMS Medicare.gov Compare Post Acute Care list provided to:: Patient Choice offered to / list presented to : Patient  Expected Discharge Plan and Services Expected Discharge Plan: [place] Choice: [place] arrangements for the past 2 months: Apartment                                      Prior Living Arrangements/Services Living arrangements for the past 2 months: Apartment Lives with:: Self Patient language and need for interpreter reviewed:: Yes Do you feel safe going back to the place where you live?: Yes      Need for Family Participation in Patient Care: Yes (Comment) Care giver support system in place?: Yes (comment) Current home services: Homehealth aide (not sure which company) Criminal Activity/Legal Involvement Pertinent to Current Situation/Hospitalization: No - Comment as needed  Activities of Daily Living Home  Assistive Devices/Equipment: Cane (specify quad or straight) (single point cane) ADL Screening (condition at time of admission) Is the patient deaf or have difficulty hearing?: No Does the patient have difficulty seeing, even when wearing glasses/contacts?: No Does the patient have difficulty concentrating, remembering, or making decisions?: No Patient able to express need for assistance with ADLs?: Yes Does the patient have difficulty dressing or bathing?: Yes Independently performs ADLs?: No Communication: Independent Dressing (OT): Needs assistance Is this a change from baseline?: Change from baseline, expected to last >3 days Grooming: Needs assistance Is this a change from baseline?: Change from baseline, expected to last >3 days Feeding: Needs assistance Is this a change from baseline?: Change from baseline, expected to last >3 days Bathing: Needs assistance Is this a change from baseline?: Change from baseline, expected to last >3 days Toileting: Needs assistance Is this a change from baseline?: Change from baseline, expected to last >3days In/Out Bed: Needs assistance Is this a change from baseline?: Change from baseline, expected to last >3 days Walks in Home: Needs assistance Is this a change from baseline?: Change from baseline, expected to last >3 days Does the patient have difficulty walking or climbing stairs?: Yes (secondary to weakness) Weakness of Legs: Left Weakness of Arms/Hands: Left  Permission Sought/Granted Permission sought to share information with : Facility Sport and exercise psychologist, Family Supports Permission granted to share information with : Yes, Verbal Permission  Granted  Share Information with NAME: daughter Jonelle Sidle  Permission granted to share info w AGENCY: SNF        Emotional Assessment Appearance:: Appears stated age Attitude/Demeanor/Rapport: Engaged Affect (typically observed): Appropriate, Pleasant Orientation: : Oriented to Self, Oriented  to Place Alcohol / Substance Use: Alcohol Use, Illicit Drugs Psych Involvement: No (comment)  Admission diagnosis:  NSTEMI (non-ST elevated myocardial infarction) [facility]) [I21.4] Patient Active Problem List   Diagnosis Date Noted  . Cerebral embolism with cerebral infarction 11/27/2019  . NSTEMI (non-ST elevated myocardial infarction) ([place]) 11/21/2019  . SIRS (systemic inflammatory response syndrome) ([place]) 11/21/2019  . Numbness on left side 11/21/2019  . Prolonged QT interval 11/21/2019  . Alcohol withdrawal ([place]) 11/21/2019  . Hyperglycemia 11/21/2019  . Pneumonia of left lower lobe due to infectious organism   . Atypical chest pain/L. Shoulder pain 04/10/2019  . CKD (chronic kidney disease), stage IV ([place]) 08/25/2018  . Constipated 06/28/2018  . Fall 07/13/2017  . Poor dentition 04/08/2016  . Health care maintenance 09/05/2013  . Psychiatric disorder 09/17/2010  . Peripheral neuropathy ([place]) 04/02/2009  . Insomnia 01/15/2009  . Chronic back pain 12/12/2007  . Human immunodeficiency virus (HIV) disease ([place]) 12/23/2005  . History of Chronic hepatitis C without hepatic coma ([place]) 12/23/2005  . Essential hypertension 12/23/2005   PCP:  [name], MD Pharmacy:   [facility], [address] [place] [address] Phone: [phone] Fax: [phone]     Social Determinants of Health (SDOH) Interventions    Readmission Risk Interventions No flowsheet data found.

## 2019-11-29 DIAGNOSIS — G9341 Metabolic encephalopathy: Secondary | ICD-10-CM

## 2019-11-29 DIAGNOSIS — I63432 Cerebral infarction due to embolism of left posterior cerebral artery: Secondary | ICD-10-CM

## 2019-11-29 LAB — CBC WITH DIFFERENTIAL/PLATELET
Abs Immature Granulocytes: 0.04 10*3/uL (ref 0.00–0.07)
Basophils Absolute: 0 10*3/uL (ref 0.0–0.1)
Basophils Relative: 1 %
Eosinophils Absolute: 0.1 10*3/uL (ref 0.0–0.5)
Eosinophils Relative: 1 %
HCT: 31.8 % — ABNORMAL LOW (ref 36.0–46.0)
Hemoglobin: 10 g/dL — ABNORMAL LOW (ref 12.0–15.0)
Immature Granulocytes: 1 %
Lymphocytes Relative: 24 %
Lymphs Abs: 1.9 10*3/uL (ref 0.7–4.0)
MCH: 31.5 pg (ref 26.0–34.0)
MCHC: 31.4 g/dL (ref 30.0–36.0)
MCV: 100.3 fL — ABNORMAL HIGH (ref 80.0–100.0)
Monocytes Absolute: 0.6 10*3/uL (ref 0.1–1.0)
Monocytes Relative: 7 %
Neutro Abs: 5.3 10*3/uL (ref 1.7–7.7)
Neutrophils Relative %: 66 %
Platelets: 273 10*3/uL (ref 150–400)
RBC: 3.17 MIL/uL — ABNORMAL LOW (ref 3.87–5.11)
RDW: 12.9 % (ref 11.5–15.5)
WBC: 7.9 10*3/uL (ref 4.0–10.5)
nRBC: 0 % (ref 0.0–0.2)

## 2019-11-29 LAB — COMPREHENSIVE METABOLIC PANEL
ALT: 15 U/L (ref 0–44)
AST: 22 U/L (ref 15–41)
Albumin: 3 g/dL — ABNORMAL LOW (ref 3.5–5.0)
Alkaline Phosphatase: 52 U/L (ref 38–126)
Anion gap: 11 (ref 5–15)
BUN: 19 mg/dL (ref 8–23)
CO2: 16 mmol/L — ABNORMAL LOW (ref 22–32)
Calcium: 9.1 mg/dL (ref 8.9–10.3)
Chloride: 114 mmol/L — ABNORMAL HIGH (ref 98–111)
Creatinine, Ser: 1.48 mg/dL — ABNORMAL HIGH (ref 0.44–1.00)
GFR, Estimated: 37 mL/min — ABNORMAL LOW (ref >60)
Glucose, Bld: 159 mg/dL — ABNORMAL HIGH (ref 70–99)
Potassium: 3.7 mmol/L (ref 3.5–5.1)
Sodium: 141 mmol/L (ref 135–145)
Total Bilirubin: 0.6 mg/dL (ref 0.3–1.2)
Total Protein: 7 g/dL (ref 6.5–8.1)

## 2019-11-29 LAB — GLUCOSE, CAPILLARY
Glucose-Capillary: 108 mg/dL — ABNORMAL HIGH (ref 70–99)
Glucose-Capillary: 111 mg/dL — ABNORMAL HIGH (ref 70–99)
Glucose-Capillary: 114 mg/dL — ABNORMAL HIGH (ref 70–99)
Glucose-Capillary: 126 mg/dL — ABNORMAL HIGH (ref 70–99)
Glucose-Capillary: 146 mg/dL — ABNORMAL HIGH (ref 70–99)
Glucose-Capillary: 83 mg/dL (ref 70–99)
Glucose-Capillary: 96 mg/dL (ref 70–99)

## 2019-11-29 LAB — AMMONIA: Ammonia: 32 umol/L (ref 9–35)

## 2019-11-29 NOTE — TOC Transition Note (Signed)
Transition of Care [facility]) - CM/SW Discharge Note   Patient Details  Name: Libby Goehring MRN: [id number] Date of Birth: [birth year]  [facility]) CM/SW Contact:  Ella Bodo, RN Phone Number: 11/29/2019, 4:39 PM   Clinical Narrative:   Pt for possible dc later today pending labwork results.  PT/OT now recommending home with [facility] services, and pt agreeable to plan.  Referral to [place], per pt choice.  Pt states she has RW at home, and has no DME needs. Pt states family able to provide 24h supervision at discharge.  No other dc needs identified.          Final next level of care: [place] Barriers to Discharge: Barriers Resolved   Patient Goals and CMS Choice Patient states their goals for this hospitalization and ongoing recovery are:: "walk on my own, not be so out of breath" CMS Medicare.gov Compare Post Acute Care list provided to:: Patient Choice offered to / list presented to : Patient                        Discharge Plan and Services In-house Referral: Clinical Social Work Discharge Planning Services: CM Consult Post Acute Care Choice: Home Health                    HH Arranged: PT, OT [facility] Agency: [place] ([facility]) Date HH Agency Contacted: 11/29/19 Time [place]: [phone] Representative spoke with at [place]: [place] ([place]) Interventions     Readmission Risk Interventions Readmission Risk Prevention Plan 11/29/2019  Transportation Screening Complete  PCP or Specialist Appt within 5-7 Days Not Complete  Not Complete comments Called to make appt, but office already closed at 4:29pm.  Home Care Screening Complete  Medication Review (RN CM) Complete  Some recent data might be hidden   Reinaldo Raddle, RN, BSN  Trauma/Neuro ICU Case Manager [phone]

## 2019-11-29 NOTE — TOC Initial Note (Signed)
Transition of Care [facility]) - Initial/Assessment Note    Patient Details  Name: Sharon Norris MRN: [id number] Date of Birth: Apr 07, 1955  Transition of Care [facility]) CM/SW Contact:    Vinie Sill, [place] Phone Number: 11/29/2019, 1:11 PM  Clinical Narrative:                  CSW met with patient. CSW introduced self and explained role. CSW discussed PT recommendation of short term rehab at [facility]. Patient states her adult daughter and grandchildren lives in the home with her and declined SNF. She states no preferred [facility] agency. She reports having walker, shower chair and cane. She states no DME needs.  CSW discuss cocaine /ETOH usage. Patient states she plans to decreasing her alcohol intake and stop cocaine usage. CSW advised the impact of drugs on her health and offered SA/ETOH/Mental Health resources, patient declined.   Thurmond Butts, MSW, [place] Clinical Social Worker   Expected Discharge Plan: [place] Barriers to Discharge: Continued Medical Work up   Patient Goals and CMS Choice Patient states their goals for this hospitalization and ongoing recovery are:: "walk on my own, not be so out of breath" CMS Medicare.gov Compare Post Acute Care list provided to:: Patient Choice offered to / list presented to : Patient  Expected Discharge Plan and Services Expected Discharge Plan: [place] In-house Referral: Clinical Social Work   Post Acute Care Choice: [place] Living arrangements for the past 2 months: Apartment                                      Prior Living Arrangements/Services Living arrangements for the past 2 months: Apartment Lives with:: Self, Adult Children Patient language and need for interpreter reviewed:: Yes Do you feel safe going back to the place where you live?: Yes      Need for Family Participation in Patient Care: Yes (Comment) Care giver support system in place?: Yes (comment) Current  home services: Homehealth aide (not sure which company) Criminal Activity/Legal Involvement Pertinent to Current Situation/Hospitalization: No - Comment as needed  Activities of Daily Living Home Assistive Devices/Equipment: Cane (specify quad or straight) (single point cane) ADL Screening (condition at time of admission) Is the patient deaf or have difficulty hearing?: No Does the patient have difficulty seeing, even when wearing glasses/contacts?: No Does the patient have difficulty concentrating, remembering, or making decisions?: No Patient able to express need for assistance with ADLs?: Yes Does the patient have difficulty dressing or bathing?: Yes Independently performs ADLs?: No Communication: Independent Dressing (OT): Needs assistance Is this a change from baseline?: Change from baseline, expected to last >3 days Grooming: Needs assistance Is this a change from baseline?: Change from baseline, expected to last >3 days Feeding: Needs assistance Is this a change from baseline?: Change from baseline, expected to last >3 days Bathing: Needs assistance Is this a change from baseline?: Change from baseline, expected to last >3 days Toileting: Needs assistance Is this a change from baseline?: Change from baseline, expected to last >3days In/Out Bed: Needs assistance Is this a change from baseline?: Change from baseline, expected to last >3 days Walks in Home: Needs assistance Is this a change from baseline?: Change from baseline, expected to last >3 days Does the patient have difficulty walking or climbing stairs?: Yes (secondary to weakness) Weakness of Legs: Left Weakness of Arms/Hands: Left  Permission Sought/Granted Permission sought to share information with : Facility Sport and exercise psychologist, Family Supports Permission granted to share information with : Yes, Verbal Permission Granted  Share Information with NAME: daughter Jonelle Sidle  Permission granted to share info w AGENCY:  SNF        Emotional Assessment Appearance:: Appears older than stated age Attitude/Demeanor/Rapport: Engaged Affect (typically observed): Appropriate Orientation: : Oriented to Self, Oriented to Place, Oriented to  Time, Oriented to Situation Alcohol / Substance Use: Illicit Drugs, Alcohol Use Psych Involvement: No (comment)  Admission diagnosis:  NSTEMI (non-ST elevated myocardial infarction) [facility]) [I21.4] Patient Active Problem List   Diagnosis Date Noted  . Cerebral embolism with cerebral infarction 11/27/2019  . NSTEMI (non-ST elevated myocardial infarction) ([place]) 11/21/2019  . SIRS (systemic inflammatory response syndrome) ([place]) 11/21/2019  . Numbness on left side 11/21/2019  . Prolonged QT interval 11/21/2019  . Alcohol withdrawal ([place]) 11/21/2019  . Hyperglycemia 11/21/2019  . Pneumonia of left lower lobe due to infectious organism   . Atypical chest pain/L. Shoulder pain 04/10/2019  . CKD (chronic kidney disease), stage IV ([place]) 08/25/2018  . Constipated 06/28/2018  . Fall 07/13/2017  . Poor dentition 04/08/2016  . Health care maintenance 09/05/2013  . Psychiatric disorder 09/17/2010  . Peripheral neuropathy ([place]) 04/02/2009  . Insomnia 01/15/2009  . Chronic back pain 12/12/2007  . Human immunodeficiency virus (HIV) disease ([place]) 12/23/2005  . History of Chronic hepatitis C without hepatic coma ([place]) 12/23/2005  . Essential hypertension 12/23/2005   PCP:  [name], MD Pharmacy:   [facility], [address] [place] [address] Phone: [phone] Fax: [phone]     Social Determinants of Health (SDOH) Interventions    Readmission Risk Interventions No flowsheet data found.

## 2019-11-29 NOTE — Progress Notes (Signed)
PROGRESS NOTE  Sharon Norris KPT:[id number] DOB: 1956/01/04   PCP: [name], MD  Patient is from: Home  DOA: 11/21/2019 LOS: 8  Chief complaints: Chest pain, burning sensation on the left side  Brief Narrative / Interim history: 64 year old female with history of HIV, EtOH abuse, HTN, remote IVDU and CKD-3A presenting to ED with the above chief complaints.  In ED, afebrile.  Slight tachycardia and tachypnea.  BP 180/100.  EKG with sinus tachycardia to 120, LVH with repolarization abnormality.  QTc 561.  CXR with mild atelectasis versus infectious process.  Noncontrast head CT negative for acute finding.  Glucose 224. Cr 2.28.  WBC 15.5. HS trop 1051.  COVID-19 PCR negative.  Received full dose aspirin and started on IV heparin.  Admitted for non-STEMI and alcohol withdrawal and AKI.    Cardiology consulted, and recommended noninvasive strategy as patient was not able to cooperate due to mental status/agitation.  Echocardiogram without significant finding.  UDS positive for cocaine  Hospital course complicated by elevated CIWA score.  MRI brain on 10/8 concerning for subacute cerebellar stroke.  She was transferred to ICU on 10/8 for Precedex drip.  However, she did not tolerate Precedex due to significant bradycardia, and she was transferred back to [facility] service on 10/11, and treated with Librium taper and as needed Ativan.  Subjective: Seen and examined earlier this morning.  No major events overnight of this morning.  No complaints. She says she feels well to go home. She says she has family members to look after her.  She is is not enthusiastic about going to rehab.  She denies chest pain, dyspnea, nausea, vomiting or abdominal pain.  Objective: Vitals:   11/29/19 0336 11/29/19 0406 11/29/19 0814 11/29/19 1221  BP: (!) 146/64 (!) 146/64 (!) 175/87 (!) 151/80  Pulse: 77 76 81 78  Resp: 20  20 17   Temp: 98.7 F (37.1 C)  97.9 F (36.6 C) 98.9 F (37.2 C)  TempSrc: Oral  Oral  Oral  SpO2: 100%  100% 100%  Weight:  91.6 kg    Height:        Intake/Output Summary (Last 24 hours) at 11/29/2019 1300 Last data filed at 11/29/2019 1222 Gross per 24 hour  Intake 2292.17 ml  Output 1100 ml  Net 1192.17 ml   Filed Weights   11/26/19 0500 11/27/19 0500 11/29/19 0406  Weight: 92 kg 92 kg 91.6 kg    Examination:  GENERAL: No apparent distress.  Nontoxic. HEENT: MMM.  Vision and hearing grossly intact.  NECK: Supple.  No apparent JVD.  RESP: On room air.  No IWOB.  Fair aeration bilaterally. CVS:  RRR. Heart sounds normal.  ABD/GI/GU: BS+. Abd soft, NTND.  MSK/EXT:  Moves extremities. No apparent deformity. No edema.  SKIN: no apparent skin lesion or wound NEURO: Awake, alert and oriented appropriately.  No apparent focal neuro deficit. PSYCH: Calm. Normal affect.  Procedures:  None  Microbiology summarized: COVID-19 PCR negative. Influenza PCR negative. MRSA PCR negative.  Assessment & Plan: Non-STEMI: Presented with chest pain and significantly elevated troponin to 1051.  TTE without significant finding.  UDS positive for cocaine which could have contributed.  Cardiology consulted and recommended medical management, and signed off.  A1c 6.1%. -Continue aspirin, statin, Coreg  Acute metabolic encephalopathy: Multifactorial including alcohol intoxication/withdrawal and possible pneumonia.  Ammonia slightly elevated at 46.  Seems to have resolved. -Treat treatable causes -Reorientation and delirium precautions. -Minimize or avoid sedating medications.  Alcohol withdrawal/alcohol abuse: Withdrawal  symptoms improved.  CIWA score has been 0-1. -Discontinue as needed Librium and continue monitoring -Counseled. -Continue vitamins.  Subacute cerebellar CVA: Severely motion degraded MRI brain on 10/8 with focus of restricted diffusion within the left cerebellar hemisphere consistent with acute/subacute infarct.  CTA head and neck with moderate stenosis of  proximal superior left M2 segment, high-grade stenosis of the anterior right A2 segment, segmental narrowing throughout the distal MCA branches bilaterally.  Neurology consulted and recommended full dose aspirin and high intensity statin and signed off.  -Continue aspirin and statin -Outpatient follow-up with neurology   Community-acquired pneumonia: Completed antibiotic course with ceftriaxone and doxycycline.  Essential hypertension: Normotensive -Continue Coreg, Imdur,  HIV: Quantitative RNA of 75 in 03/2019.  CD4 count 483 on 11/27/19. -Continue Triumeq  Prolonged QTC: QTC of 561 on admit. -Avoid QTC prolonging drugs  AKI on CKD-3A: Baseline Cr 1.3-1.6 > 2.47 in 04/2019 > 2.28 (admit) > 1.19 >> 1.78 > 1.48.  Resolved. -Avoid nephrotoxic meds -Continue monitoring  Generalized weakness/debility -PT/OT recommended SNF but patient would like to go home with [facility]  Cocaine abuse: UDS positive for cocaine -Counseled. -TOC consulted  Tobacco use disorder: -Continue nicotine patch -Encourage cessation.  Class II obesity Body mass index is 35.77 kg/m.         DVT prophylaxis:  enoxaparin (LOVENOX) injection 40 mg Start: 11/25/19 1000  Code Status: Full code Family Communication: Updated patient's daughter over the phone. Status is: Inpatient  Remains inpatient appropriate because:Unsafe d/c plan and Inpatient level of care appropriate due to severity of illness   Dispo: The patient is from: Home              Anticipated d/c is to: Home vs SNF              Anticipated d/c date is: 1 day              Patient currently is not medically stable to d/c.       Consultants:  Cardiology Neurology   Sch Meds:  Scheduled Meds:  abacavir-dolutegravir-lamiVUDine  1 tablet Oral Daily   aspirin  325 mg Oral Daily   atorvastatin  80 mg Oral Daily   carvedilol  25 mg Oral BID WC   Chlorhexidine Gluconate Cloth  6 each Topical Daily   enoxaparin (LOVENOX) injection  40  mg Subcutaneous [id number]   folic acid  1 mg Intravenous Daily   insulin aspart  0-9 Units Subcutaneous Q4H   isosorbide mononitrate  120 mg Oral Daily   lactulose  20 g Oral TID   multivitamin with minerals  1 tablet Oral Daily   nicotine  14 mg Transdermal Daily   sodium chloride flush  10-40 mL Intracatheter Q12H   thiamine injection  100 mg Intravenous Daily   thiamine  100 mg Intramuscular Once   thiamine  100 mg Oral Daily   Continuous Infusions:  sodium chloride 50 mL/hr at 11/29/19 0551   PRN Meds:.acetaminophen, chlordiazePOXIDE, hydrALAZINE, hydrOXYzine, nicotine polacrilex, sodium chloride flush  Antimicrobials: Anti-infectives (From admission, onward)   Start     Dose/Rate Route Frequency Ordered Stop   11/21/19 1000  abacavir-dolutegravir-lamiVUDine (TRIUMEQ) [id number] MG per tablet 1 tablet        1 tablet Oral Daily 11/21/19 0537     11/21/19 0630  doxycycline (VIBRAMYCIN) 100 mg in sodium chloride 0.9 % 250 mL IVPB        100 mg 125 mL/hr over 120 Minutes Intravenous Every 12 hours 11/21/19  9983 11/25/19 2359   11/21/19 0600  cefTRIAXone (ROCEPHIN) 2 g in sodium chloride 0.9 % 100 mL IVPB        2 g 200 mL/hr over 30 Minutes Intravenous Every 24 hours 11/21/19 0538 11/25/19 0643       I have personally reviewed the following labs and images: CBC: Recent Labs  Lab 11/24/19 0034 11/25/19 1319 11/26/19 0256 11/28/19 0448 11/29/19 1026  WBC 11.0* 6.0 10.1 8.2 7.9  NEUTROABS 7.0 3.8 7.2 5.3 5.3  HGB 10.7* 12.1 11.3* 9.8* 10.0*  HCT 33.2* 36.4 35.3* 31.2* 31.8*  MCV 103.1* 98.9 99.4 104.0* 100.3*  PLT 232 PLATELET CLUMPS NOTED ON SMEAR, COUNT APPEARS DECREASED 273 257 273   BMP &GFR Recent Labs  Lab 11/24/19 0034 11/25/19 1319 11/26/19 0256 11/28/19 0448 11/29/19 1026  NA 141 141 142 143 141  K 3.9 4.2 3.4* 3.7 3.7  CL 112* 110 111 116* 114*  CO2 18* 16* 18* 16* 16*  GLUCOSE 111* 96 86 118* 159*  BUN 10 15 14 17 19   CREATININE 1.24* 1.19*  1.22* 1.78* 1.48*  CALCIUM 9.0 9.5 9.5 9.2 9.1   Estimated Creatinine Clearance: 40.2 mL/min (A) (by C-G formula based on SCr of 1.48 mg/dL (H)). Liver & Pancreas: Recent Labs  Lab 11/23/19 0104 11/24/19 0034 11/25/19 1319 11/28/19 0448 11/29/19 1026  AST 28 20 37 23 22  ALT 14 14 14 16 15   ALKPHOS 57 57 58 52 52  BILITOT 0.7 0.3 1.0 0.3 0.6  PROT 6.7 7.2 7.3 6.9 7.0  ALBUMIN 2.9* 2.9* 3.0* 2.9* 3.0*   No results for input(s): LIPASE, AMYLASE in the last 168 hours. Recent Labs  Lab 11/23/19 0104  AMMONIA 46*   Diabetic: No results for input(s): HGBA1C in the last 72 hours. Recent Labs  Lab 11/28/19 2218 11/28/19 2339 11/29/19 0335 11/29/19 0813 11/29/19 1215  GLUCAP 122* 114* 126* 111* 146*   Cardiac Enzymes: No results for input(s): CKTOTAL, CKMB, CKMBINDEX, TROPONINI in the last 168 hours. No results for input(s): PROBNP in the last 8760 hours. Coagulation Profile: No results for input(s): INR, PROTIME in the last 168 hours. Thyroid Function Tests: No results for input(s): TSH, T4TOTAL, FREET4, T3FREE, THYROIDAB in the last 72 hours. Lipid Profile: No results for input(s): CHOL, HDL, LDLCALC, TRIG, CHOLHDL, LDLDIRECT in the last 72 hours. Anemia Panel: No results for input(s): VITAMINB12, FOLATE, FERRITIN, TIBC, IRON, RETICCTPCT in the last 72 hours. Urine analysis:    Component Value Date/Time   COLORURINE YELLOW 11/23/2019 1629   APPEARANCEUR CLEAR 11/23/2019 1629   LABSPEC 1.012 11/23/2019 1629   PHURINE 5.0 11/23/2019 1629   GLUCOSEU NEGATIVE 11/23/2019 1629   GLUCOSEU NEG mg/dL 12/12/2007 2026   HGBUR NEGATIVE 11/23/2019 1629   BILIRUBINUR NEGATIVE 11/23/2019 [place] 11/23/2019 1629   PROTEINUR 30 (A) 11/23/2019 1629   UROBILINOGEN 1.0 05/25/2013 1940   NITRITE NEGATIVE 11/23/2019 1629   LEUKOCYTESUR NEGATIVE 11/23/2019 1629   Sepsis Labs: Invalid input(s): PROCALCITONIN, [place]  Microbiology: Recent Results (from the  past 240 hour(s))  Respiratory Panel by RT PCR (Flu A&B, Covid) - Nasopharyngeal Swab     Status: None   Collection Time: 11/21/19  2:55 AM   Specimen: Nasopharyngeal Swab  Result Value Ref Range Status   SARS Coronavirus 2 by RT PCR NEGATIVE NEGATIVE Final    Comment: (NOTE) SARS-CoV-2 target nucleic acids are NOT DETECTED.  The SARS-CoV-2 RNA is generally detectable in upper respiratoy specimens during the acute  phase of infection. The lowest concentration of SARS-CoV-2 viral copies this assay can detect is 131 copies/mL. A negative result does not preclude SARS-Cov-2 infection and should not be used as the sole basis for treatment or other patient management decisions. A negative result may occur with  improper specimen collection/handling, submission of specimen other than nasopharyngeal swab, presence of viral mutation(s) within the areas targeted by this assay, and inadequate number of viral copies (<131 copies/mL). A negative result must be combined with clinical observations, patient history, and epidemiological information. The expected result is Negative.  Fact Sheet for Patients:  PinkCheek.be  Fact Sheet for Healthcare Providers:  GravelBags.it  This test is no t yet approved or cleared by the [place] FDA and  has been authorized for detection and/or diagnosis of SARS-CoV-2 by FDA under an Emergency Use Authorization (EUA). This EUA will remain  in effect (meaning this test can be used) for the duration of the COVID-19 declaration under Section 564(b)(1) of the Act, 21 U.S.C. section 360bbb-3(b)(1), unless the authorization is terminated or revoked sooner.     Influenza A by PCR NEGATIVE NEGATIVE Final   Influenza B by PCR NEGATIVE NEGATIVE Final    Comment: (NOTE) The Xpert Xpress SARS-CoV-2/FLU/RSV assay is intended as an aid in  the diagnosis of influenza from Nasopharyngeal swab specimens and    should not be used as a sole basis for treatment. Nasal washings and  aspirates are unacceptable for Xpert Xpress SARS-CoV-2/FLU/RSV  testing.  Fact Sheet for Patients: PinkCheek.be  Fact Sheet for Healthcare Providers: GravelBags.it  This test is not yet approved or cleared by the [place] FDA and  has been authorized for detection and/or diagnosis of SARS-CoV-2 by  FDA under an Emergency Use Authorization (EUA). This EUA will remain  in effect (meaning this test can be used) for the duration of the  Covid-19 declaration under Section 564(b)(1) of the Act, 21  U.S.C. section 360bbb-3(b)(1), unless the authorization is  terminated or revoked. Performed at [facility], [place] [address], [place], [place] [address]   MRSA PCR Screening     Status: None   Collection Time: 11/21/19  6:15 PM   Specimen: Nasopharyngeal  Result Value Ref Range Status   MRSA by PCR NEGATIVE NEGATIVE Final    Comment:        The GeneXpert MRSA Assay (FDA approved for NASAL specimens only), is one component of a comprehensive MRSA colonization surveillance program. It is not intended to diagnose MRSA infection nor to guide or monitor treatment for MRSA infections. Performed at [facility], [place] [address], [place], [place] [address]     Radiology Studies: No results found.    Nazyia Gaugh T. Clifton  If 7PM-7AM, please contact night-coverage [email or web address] 11/29/2019, 1:00 PM

## 2019-11-29 NOTE — Progress Notes (Signed)
Physical Therapy Treatment Patient Details Name: Sharon Norris MRN: [id number] DOB: 01/03/1956 Today's Date: 11/29/2019    History of Present Illness Sharon Norris is a 64 y.o. female with medical history significant for HIV, alcohol abuse, hypertension, remote history of IV drug abuse, and chronic kidney disease stage IIIb-IV, presenting to the emergency department for evaluation of chest pain. Pt found to have cocaine in system, NSTEMI, and acute L cerebellar infarct.    PT Comments    Pt with progressed activity tolerance today, ambulating hallway distance and toileting without PT assist. Pt tolerated x3 transfers throughout session with no physical assist to perform. Pt with use of RW for distance gait, but also ambulatory without AD and close guard for safety for short distances. PT updating recommendation to reflect HHPT and 24/7 assist from daughter/grandchildren, pt is adamant about d/c home and has progressed with mobility well. Will continue to follow acutely.     Follow Up Recommendations  Supervision/Assistance - 24 hour;Home health PT     Equipment Recommendations  Rolling walker with 5" wheels    Recommendations for Other Services       Precautions / Restrictions Precautions Precautions: Fall Restrictions Weight Bearing Restrictions: No    Mobility  Bed Mobility Overal bed mobility: Needs Assistance             General bed mobility comments: in recliner upon PT arrival to room  Transfers Overall transfer level: Needs assistance Equipment used: Rolling walker (2 wheeled) Transfers: Sit to/from Stand Sit to Stand: Min guard         General transfer comment: Min guard for safety, verbal cuing for proper hand placement when rising/sitting. sit to stand x3, from recliner, toilet, and chair in hallway.  Ambulation/Gait Ambulation/Gait assistance: Min guard Gait Distance (Feet): 100 Feet (x2) Assistive device: Rolling walker (2 wheeled) Gait  Pattern/deviations: Step-through pattern;Decreased stride length;Trunk flexed Gait velocity: decr   General Gait Details: min guard for safety only, pt with mild drifting L and R, pt corrected when PT provided visual and verbal cues. Seated rest break x1 during ambulation, VSS.   Stairs Stairs:  (pt declines practicing)           Wheelchair Mobility    Modified Rankin (Stroke Patients Only) Modified Rankin (Stroke Patients Only) Pre-Morbid Rankin Score: No symptoms Modified Rankin: Moderate disability     Balance Overall balance assessment: Needs assistance Sitting-balance support: Feet supported;No upper extremity supported Sitting balance-Leahy Scale: Fair     Standing balance support: During functional activity Standing balance-Leahy Scale: Fair Standing balance comment: short distance ambulation without AD, mild unsteadiness but no overt LOB                            Cognition Arousal/Alertness: Awake/alert Behavior During Therapy: WFL for tasks assessed/performed Overall Cognitive Status: No family/caregiver present to determine baseline cognitive functioning                                 General Comments: Pt states today she will have 24/7 assist from daughter and grandchildren upon d/c from acute, insistent on d/c home. Decreased safety awareness with using and navigating RW, PT provided cuing to correct.      Exercises      General Comments General comments (skin integrity, edema, etc.): SpO2 92-100% on RA      Pertinent Vitals/Pain Pain Assessment: No/denies pain Faces  Pain Scale: No hurt Pain Intervention(s): Monitored during session    Home Living                      Prior Function            PT Goals (current goals can now be found in the care plan section) Acute Rehab PT Goals Patient Stated Goal: home PT Goal Formulation: With patient Time For Goal Achievement: 12/11/19 Potential to Achieve Goals:  Good Progress towards PT goals: Progressing toward goals    Frequency    Min 4X/week      PT Plan Discharge plan needs to be updated    Co-evaluation              AM-PAC PT "6 Clicks" Mobility   Outcome Measure  Help needed turning from your back to your side while in a flat bed without using bedrails?: A Little Help needed moving from lying on your back to sitting on the side of a flat bed without using bedrails?: A Little Help needed moving to and from a bed to a chair (including a wheelchair)?: A Little Help needed standing up from a chair using your arms (e.g., wheelchair or bedside chair)?: A Little Help needed to walk in hospital room?: A Little Help needed climbing 3-5 steps with a railing? : A Little 6 Click Score: 18    End of Session Equipment Utilized During Treatment: Gait belt Activity Tolerance: Patient tolerated treatment well Patient left: in chair;with call bell/phone within reach;with chair alarm set;with nursing/sitter in room Nurse Communication: Mobility status PT Visit Diagnosis: Unsteadiness on feet (R26.81);Muscle weakness (generalized) (M62.81);Difficulty in walking, not elsewhere classified (R26.2)     Time: 8295-6213 PT Time Calculation (min) (ACUTE ONLY): 26 min  Charges:  $Gait Training: 8-22 mins $Therapeutic Activity: 8-22 mins                    Treshaun Carrico E, PT Acute Rehabilitation Services Pager [phone]  Office [phone]    Sheldon Sem D Elonda Husky 11/29/2019, 3:19 PM

## 2019-11-29 NOTE — Progress Notes (Signed)
Occupational Therapy Treatment Patient Details Name: Sharon Norris MRN: [id number] DOB: 1955-09-14 Today's Date: 11/29/2019    History of present illness Sharon Norris is a 64 y.o. female with medical history significant for HIV, alcohol abuse, hypertension, remote history of IV drug abuse, and chronic kidney disease stage IIIb-IV, presenting to the emergency department for evaluation of chest pain. Pt found to have cocaine in system, NSTEMI, and acute L cerebellar infarct.   OT comments  Pt is now able to perform ADLs with min guard assist. She insists that her daughter will be available to provide 24 hour assist at discharge.  Recommendations for direct assist with shower transfer, medication and financial management as well as cooking shared with pt and she verbalized understanding.   Follow Up Recommendations  Home health OT;Supervision/Assistance - 24 hour    Equipment Recommendations  None recommended by OT    Recommendations for Other Services      Precautions / Restrictions Precautions Precautions: Fall Restrictions Weight Bearing Restrictions: No       Mobility Bed Mobility Overal bed mobility: Needs Assistance             General bed mobility comments: Pt sitting EOB   Transfers Overall transfer level: Needs assistance Equipment used: Rolling walker (2 wheeled) Transfers: Sit to/from [facility] Sit to Stand: Min guard Stand pivot transfers: Min guard       General transfer comment: Pt requires min guard assist and min cues for hand placement     Balance Overall balance assessment: Needs assistance Sitting-balance support: Feet supported;No upper extremity supported Sitting balance-Leahy Scale: Good     Standing balance support: During functional activity Standing balance-Leahy Scale: Fair Standing balance comment: able to maintain static standing with min guard assist                            ADL either performed or  assessed with clinical judgement   ADL Overall ADL's : Needs assistance/impaired     Grooming: Wash/dry hands;Wash/dry face;Oral care;Brushing hair;Min guard;Standing   Upper Body Bathing: Set up;Sitting   Lower Body Bathing: Min guard;Sit to/from stand   Upper Body Dressing : Set up;Sitting   Lower Body Dressing: Min guard;Sit to/from stand   Toilet Transfer: Min guard;Ambulation;Comfort height toilet;Grab bars;RW   Toileting- Water quality scientist and Hygiene: Min guard;Sit to/from stand       Functional mobility during ADLs: Passenger transport manager     Praxis      Cognition Arousal/Alertness: Awake/alert Behavior During Therapy: WFL for tasks assessed/performed Overall Cognitive Status: Within Functional Limits for tasks assessed                                 General Comments: WFL for basic ADLs         Exercises     Shoulder Instructions       General Comments Discussed recommendation with pt that she have 24 hour supervision/assist, that she have daughter assist with shower transfer, and that she have direct assist/supervision with medication management, cooking, and finances.  She verbalized understanding and reports daughter will provide assist     Pertinent Vitals/ Pain       Pain Assessment: No/denies pain Faces Pain Scale: No hurt Pain Intervention(s): Monitored during session  Home Living  Prior Functioning/Environment              Frequency  Min 2X/week        Progress Toward Goals  OT Goals(current goals can now be found in the care plan section)  Progress towards OT goals: Not progressing toward goals - comment  Acute Rehab OT Goals Patient Stated Goal: home  Plan Discharge plan needs to be updated    Co-evaluation                 AM-PAC OT "6 Clicks" Daily Activity     Outcome Measure   Help from another  person eating meals?: A Little Help from another person taking care of personal grooming?: A Little Help from another person toileting, which includes using toliet, bedpan, or urinal?: A Little Help from another person bathing (including washing, rinsing, drying)?: A Little Help from another person to put on and taking off regular upper body clothing?: A Little Help from another person to put on and taking off regular lower body clothing?: A Little 6 Click Score: 18    End of Session Equipment Utilized During Treatment: Rolling walker  OT Visit Diagnosis: Muscle weakness (generalized) (M62.81);Other symptoms and signs involving cognitive function;Unsteadiness on feet (R26.81)   Activity Tolerance Patient tolerated treatment well   Patient Left in bed;with call bell/phone within reach   Nurse Communication          Time: 6734-1937 OT Time Calculation (min): 8 min  Charges: OT General Charges $OT Visit: 1 Visit OT Treatments $Self Care/Home Management : 8-22 mins  Nilsa Nutting OTR/L Acute Rehabilitation Services Pager [phone] Office [phone]    Lucille Passy M 11/29/2019, 4:58 PM

## 2019-11-30 DIAGNOSIS — R5381 Other malaise: Secondary | ICD-10-CM

## 2019-11-30 DIAGNOSIS — F17200 Nicotine dependence, unspecified, uncomplicated: Secondary | ICD-10-CM

## 2019-11-30 DIAGNOSIS — F1410 Cocaine abuse, uncomplicated: Secondary | ICD-10-CM

## 2019-11-30 LAB — CBC WITH DIFFERENTIAL/PLATELET
Abs Immature Granulocytes: 0.04 10*3/uL (ref 0.00–0.07)
Basophils Absolute: 0 10*3/uL (ref 0.0–0.1)
Basophils Relative: 0 %
Eosinophils Absolute: 0.1 10*3/uL (ref 0.0–0.5)
Eosinophils Relative: 1 %
HCT: 29.4 % — ABNORMAL LOW (ref 36.0–46.0)
Hemoglobin: 9.5 g/dL — ABNORMAL LOW (ref 12.0–15.0)
Immature Granulocytes: 1 %
Lymphocytes Relative: 22 %
Lymphs Abs: 1.9 10*3/uL (ref 0.7–4.0)
MCH: 32.6 pg (ref 26.0–34.0)
MCHC: 32.3 g/dL (ref 30.0–36.0)
MCV: 101 fL — ABNORMAL HIGH (ref 80.0–100.0)
Monocytes Absolute: 0.6 10*3/uL (ref 0.1–1.0)
Monocytes Relative: 7 %
Neutro Abs: 6.1 10*3/uL (ref 1.7–7.7)
Neutrophils Relative %: 69 %
Platelets: 245 10*3/uL (ref 150–400)
RBC: 2.91 MIL/uL — ABNORMAL LOW (ref 3.87–5.11)
RDW: 13.1 % (ref 11.5–15.5)
WBC: 8.8 10*3/uL (ref 4.0–10.5)
nRBC: 0 % (ref 0.0–0.2)

## 2019-11-30 LAB — COMPREHENSIVE METABOLIC PANEL
ALT: 16 U/L (ref 0–44)
AST: 20 U/L (ref 15–41)
Albumin: 2.9 g/dL — ABNORMAL LOW (ref 3.5–5.0)
Alkaline Phosphatase: 48 U/L (ref 38–126)
Anion gap: 8 (ref 5–15)
BUN: 18 mg/dL (ref 8–23)
CO2: 17 mmol/L — ABNORMAL LOW (ref 22–32)
Calcium: 9.1 mg/dL (ref 8.9–10.3)
Chloride: 115 mmol/L — ABNORMAL HIGH (ref 98–111)
Creatinine, Ser: 1.36 mg/dL — ABNORMAL HIGH (ref 0.44–1.00)
GFR, Estimated: 40 mL/min — ABNORMAL LOW (ref >60)
Glucose, Bld: 102 mg/dL — ABNORMAL HIGH (ref 70–99)
Potassium: 3.6 mmol/L (ref 3.5–5.1)
Sodium: 140 mmol/L (ref 135–145)
Total Bilirubin: 0.3 mg/dL (ref 0.3–1.2)
Total Protein: 6.8 g/dL (ref 6.5–8.1)

## 2019-11-30 LAB — MAGNESIUM: Magnesium: 1.9 mg/dL (ref 1.7–2.4)

## 2019-11-30 LAB — GLUCOSE, CAPILLARY
Glucose-Capillary: 104 mg/dL — ABNORMAL HIGH (ref 70–99)
Glucose-Capillary: 146 mg/dL — ABNORMAL HIGH (ref 70–99)

## 2019-11-30 LAB — PHOSPHORUS: Phosphorus: 3.7 mg/dL (ref 2.5–4.6)

## 2019-11-30 MED ORDER — AMLODIPINE BESYLATE 10 MG PO TABS
10.0000 mg | ORAL_TABLET | Freq: Every day | ORAL | Status: DC
  Administered 2019-11-30: 10 mg via ORAL
  Filled 2019-11-30: qty 1

## 2019-11-30 MED ORDER — NICOTINE 14 MG/24HR TD PT24: 14.0000 mg | MEDICATED_PATCH | Freq: Every day | TRANSDERMAL | 0 refills | Status: AC

## 2019-11-30 MED ORDER — CARVEDILOL 25 MG PO TABS
25.0000 mg | ORAL_TABLET | Freq: Two times a day (BID) | ORAL | 1 refills | Status: DC
Start: 2019-11-30 — End: 2020-05-23

## 2019-11-30 MED ORDER — ASPIRIN 325 MG PO TABS
325.0000 mg | ORAL_TABLET | Freq: Every day | ORAL | 1 refills | Status: DC
Start: 2019-11-30 — End: 2020-05-23

## 2019-11-30 MED ORDER — ATORVASTATIN CALCIUM 80 MG PO TABS
80.0000 mg | ORAL_TABLET | Freq: Every day | ORAL | 1 refills | Status: DC
Start: 2019-11-30 — End: 2020-05-23

## 2019-11-30 MED ORDER — DIVALPROEX SODIUM ER 500 MG PO TB24
500.0000 mg | ORAL_TABLET | Freq: Every day | ORAL | Status: DC
  Administered 2019-11-30: 500 mg via ORAL
  Filled 2019-11-30: qty 1

## 2019-11-30 MED ORDER — ISOSORBIDE MONONITRATE ER 60 MG PO TB24
60.0000 mg | ORAL_TABLET | Freq: Every day | ORAL | 1 refills | Status: DC
Start: 2019-11-30 — End: 2020-05-23

## 2019-11-30 MED ORDER — AMLODIPINE BESYLATE 10 MG PO TABS
10.0000 mg | ORAL_TABLET | Freq: Every day | ORAL | 1 refills | Status: DC
Start: 2019-11-30 — End: 2020-05-23

## 2019-11-30 MED ORDER — GABAPENTIN 300 MG PO CAPS
300.0000 mg | ORAL_CAPSULE | Freq: Two times a day (BID) | ORAL | Status: DC
  Administered 2019-11-30: 300 mg via ORAL
  Filled 2019-11-30: qty 1

## 2019-11-30 NOTE — Progress Notes (Signed)
Discharge instructions given. Patient verbalized understanding and all questions were answered.  ?

## 2019-11-30 NOTE — Progress Notes (Signed)
Spoke with Mallie Snooks and made her aware through secure chat, that this patient has a midline not a picc line and she can remove this line

## 2019-11-30 NOTE — Discharge Summary (Signed)
Physician Discharge Summary  Sharon Norris AYO:[id number] DOB: [birth year] DOA: 11/21/2019  PCP: [name], MD  Admit date: 11/21/2019 Discharge date: 11/30/2019  Admitted From: Home Disposition: Home  Recommendations for Outpatient Follow-up:  1. Follow ups as below. 2. Please obtain CBC/BMP/Mag at follow up 3. Please follow up on the following pending results: None  Home Health: PT/OT/RN Equipment/Devices: Rolling walker  Discharge Condition: Stable CODE STATUS: Full code   Follow-up Information    [name], MD. Call.   Specialty: Internal Medicine Why: CALL FOR APPT IN 1-2 WEEKS Contact information: [address] [place] [address] [place] [facility] up.   Why: Nurse,physical and occupational therapist at home; they will call you to arrange an appointment.  Contact information: Phone: [phone]             Hospital Course: 64 year old female with history of HIV, EtOH abuse, HTN, remote IVDU and CKD-3A presenting to ED with the above chief complaints.  In ED, afebrile.  Slight tachycardia and tachypnea.  BP 180/100.  EKG with sinus tachycardia to 120, LVH with repolarization abnormality.  QTc 561.  CXR with mild atelectasis versus infectious process.  Noncontrast head CT negative for acute finding.  Glucose 224. Cr 2.28.  WBC 15.5. HS trop 1051.  COVID-19 PCR negative.  Received full dose aspirin and started on IV heparin.  Admitted for non-STEMI and alcohol withdrawal and AKI.    Cardiology consulted, and recommended noninvasive strategy as patient was not able to cooperate due to mental status/agitation.  Echocardiogram without significant finding.  UDS positive for cocaine  Hospital course complicated by elevated CIWA score.  MRI brain on 10/8 concerning for subacute cerebellar stroke.  She was transferred to ICU on 10/8 for Precedex drip.  However, she did not tolerate Precedex due to significant  bradycardia, and she was transferred back to [facility] service on 10/11, and treated with as needed Librium.  Eventually, encephalopathy and withdrawal symptoms resolved.  Therapy initially recommended SNF.  However, patient improved significantly, and no recommendation has changed to home health with DME.   See individual problem list below for more hospital course.   Discharge Diagnoses:  Non-STEMI: Presented with chest pain and significantly elevated troponin to 1051.  TTE without significant finding.  UDS positive for cocaine which could have contributed.  Cardiology consulted and recommended medical management, and signed off.  A1c 6.1%. -Continue aspirin, statin and Coreg  Subacute cerebellar CVA: Severely motion degraded MRI brain on 10/8 with focus of restricted diffusion within the left cerebellar hemisphere consistent with acute/subacute infarct.  CTA head and neck with moderate stenosis of proximal superior left M2 segment, high-grade stenosis of the anterior right A2 segment, segmental narrowing throughout the distal MCA branches bilaterally.  Neurology consulted and recommended full dose aspirin and high intensity statin and signed off.  -Discharged on full dose aspirin and statin per neuro recommendation. -Outpatient follow-up with neurology in 4 weeks.  Ambulatory referral ordered.  Acute metabolic encephalopathy: Multifactorial including alcohol intoxication/withdrawal, cocaine use and possible pneumonia.   Alcohol withdrawal/alcohol abuse: Withdrawal symptoms resolved. -Advised to quit drinking.  Community-acquired pneumonia: Completed antibiotic course with ceftriaxone and doxycycline.  Essential hypertension: Normotensive -Discharged on Coreg, Imdur and amlodipine.  HIV: Quantitative RNA of 75 in 03/2019.  CD4 count 483 on 11/27/19. -Continue Triumeq  Prolonged QTC: QTC of 561 on admit. -Avoid QTC prolonging drugs  AKI on CKD-3A: Baseline Cr 1.3-1.6 >  2.47 in 04/2019 >  2.28 (admit) > 1.19 >> 1.78 > 1.36.  Resolved. -Discontinue lisinopril/HCTZ. -Recheck BMP in 1 to 2 weeks.  Generalized weakness/debility: Improved. -Home health PT/OT/RN -Rolling walker  Cocaine abuse: UDS positive for cocaine -Counseled.  Tobacco use disorder: Reports smoking about 1 cigarette a day. -Encourage cessation.  Class II obesity Body mass index is 35.66 kg/m.            Discharge Exam: Vitals:   11/30/19 0355 11/30/19 0740  BP: (!) 182/81 (!) 171/87  Pulse: 88 85  Resp: 19 20  Temp: 97.9 F (36.6 C) 97.7 F (36.5 C)  SpO2: 98% 99%    GENERAL: No apparent distress.  Nontoxic. HEENT: MMM.  Vision and hearing grossly intact.  NECK: Supple.  No apparent JVD.  RESP:  No IWOB.  Fair aeration bilaterally. CVS:  RRR. Heart sounds normal.  ABD/GI/GU: Bowel sounds present. Soft. Non tender.  MSK/EXT:  Moves extremities. No apparent deformity. No edema.  SKIN: no apparent skin lesion or wound NEURO: Awake, alert and oriented appropriately.  No apparent focal neuro deficit. PSYCH: Calm. Normal affect.  Discharge Instructions  Discharge Instructions    Ambulatory referral to Cardiology   Complete by: As directed    NSTEMI   Ambulatory referral to Neurology   Complete by: As directed    An appointment is requested in approximately: 4 weeks   Call MD for:  difficulty breathing, headache or visual disturbances   Complete by: As directed    Call MD for:  extreme fatigue   Complete by: As directed    Call MD for:  persistant dizziness or light-headedness   Complete by: As directed    Call MD for:  temperature >100.4   Complete by: As directed    Diet - low sodium heart healthy   Complete by: As directed    Discharge instructions   Complete by: As directed    It has been a pleasure taking care of you!  You were hospitalized due to chest pain, subacute stroke, confusion and alcohol withdrawal.  You were treated medically and your symptoms improved.   We are discharging you on new medications.  Please review your new medication list of the directions on your medications before you take them.  There is very important that you take your medications as prescribed.  We strongly encourage you to quit drinking alcohol, smoking cigarettes or using cocaine.  Please follow-up with your primary care doctor in 1 to 2 weeks.   It is important that you quit smoking cigarettes.  You may use nicotine patch to help you quit smoking.  Nicotine patch is available over-the-counter.  You may also discuss other options to help you quit smoking with your primary care doctor. You can also talk to professional counselors at 1-800-QUIT-NOW (726)091-2472) for free smoking cessation counseling.  Take care,   Increase activity slowly   Complete by: As directed      Allergies as of 11/30/2019      Reactions   Benadryl [diphenhydramine Hcl] Hypertension   Ribavirin Rash   Viekira Pak [ombitasvir-paritaprevir-ritonavir-dasabuvir] Rash   Hives like reaction      Medication List    STOP taking these medications   aluminum sulfate-calcium acetate packet Commonly known as: DOMEBORO   lisinopril-hydrochlorothiazide 20-25 MG tablet Commonly known as: ZESTORETIC     TAKE these medications   amLODipine 10 MG tablet Commonly known as: NORVASC Take 1 tablet (10 mg total) by mouth daily. What  changed:   medication strength  how much to take   aspirin 325 MG tablet Take 1 tablet (325 mg total) by mouth daily.   atorvastatin 80 MG tablet Commonly known as: LIPITOR Take 1 tablet (80 mg total) by mouth daily.   carvedilol 25 MG tablet Commonly known as: COREG Take 1 tablet (25 mg total) by mouth 2 (two) times daily with a meal.   divalproex 500 MG 24 hr tablet Commonly known as: DEPAKOTE ER TAKE ONE TABLET BY MOUTH DAILY   gabapentin 300 MG capsule Commonly known as: NEURONTIN TAKE TWO CAPSULES BY MOUTH TWICE A DAY What changed:   how much to  take  additional instructions   isosorbide mononitrate 60 MG 24 hr tablet Commonly known as: IMDUR Take 1 tablet (60 mg total) by mouth daily.   multivitamin with minerals Tabs tablet Take 1 tablet by mouth daily.   nicotine 14 mg/24hr patch Commonly known as: NICODERM CQ - dosed in mg/24 hours Place 1 patch (14 mg total) onto the skin daily.   Triumeq [id number] MG tablet Generic drug: abacavir-dolutegravir-lamiVUDine TAKE ONE TABLET BY MOUTH DAILY            Durable Medical Equipment  (From admission, onward)         Start     Ordered   11/29/19 1556  For home use only DME Walker rolling  Once       Question Answer Comment  Walker: With [employer] Wheels   Patient needs a walker to treat with the following condition Unsteady gait      11/29/19 1555          Consultations:  Cardiology  PCCM  Neurology  Procedures/Studies:  2D Echo on 11/21/2019 1. Mild intracavitary gradient. Peak velocity 1.78 m/s. Peak gradient  12.6 mmHg. Left ventricular ejection fraction, by estimation, is 60 to  65%. The left ventricle has normal function. The left ventricle has no  regional wall motion abnormalities. There  is mild concentric left ventricular hypertrophy. Left ventricular  diastolic parameters are consistent with Grade I diastolic dysfunction  (impaired relaxation). Elevated left ventricular end-diastolic pressure.  2. Right ventricular systolic function is normal. The right ventricular  size is normal. There is normal pulmonary artery systolic pressure.  3. The mitral valve is normal in structure. No evidence of mitral valve  regurgitation. No evidence of mitral stenosis.  4. The aortic valve is normal in structure. Aortic valve regurgitation is  not visualized. No aortic stenosis is present.  5. The inferior vena cava is normal in size with <50% respiratory  variability, suggesting right atrial pressure of 8 mmHg.    CT ANGIO HEAD W OR WO  CONTRAST  Result Date: 11/26/2019 CLINICAL DATA:  Stroke, follow-up. Patient presented with left-sided weakness 5 days ago. Left cerebellar infarct on MRI. EXAM: CT ANGIOGRAPHY HEAD AND NECK TECHNIQUE: Multidetector CT imaging of the head and neck was performed using the standard protocol during bolus administration of intravenous contrast. Multiplanar CT image reconstructions and MIPs were obtained to evaluate the vascular anatomy. Carotid stenosis measurements (when applicable) are obtained utilizing NASCET criteria, using the distal internal carotid diameter as the denominator. CONTRAST:  72mL OMNIPAQUE IOHEXOL 350 MG/ML SOLN COMPARISON:  None. FINDINGS: CT HEAD FINDINGS Brain: The acute/subacute posteromedial left cerebellar infarct is again seen. Remote lacunar infarcts are noted within the basal ganglia. No acute cortical infarcts are present. No progression is evident. Ventricles are of normal size. Insert pass fluid Vascular: Atherosclerotic calcifications  are present within the cavernous internal carotid arteries bilaterally. No hyperdense vessel is present. Skull: Calvarium is intact. No focal lytic or blastic lesions are present. No significant extracranial soft tissue lesion is present. Sinuses: The paranasal sinuses and mastoid air cells are clear. Orbits: The globes and orbits are within normal limits. Review of the MIP images confirms the above findings CTA NECK FINDINGS Aortic arch: Atherosclerotic calcifications are present within the aortic arch and at the origin of the innominate artery and left subclavian artery. No significant stenosis or aneurysm is present. Right carotid system: Although the study is mildly degraded by patient motion. The right common carotid artery is within normal limits. Atherosclerotic calcification is present at the right bifurcation without significant stenosis. Mild tortuosity is present cervical right ICA without significant stenosis. Left carotid system: The left  common carotid artery is distorted by motion. Atherosclerotic calcifications are present at the bifurcation without significant stenosis. Cervical left ICA is within normal limits. Vertebral arteries: Right vertebral artery is the dominant vessel. Left vertebral artery is hypoplastic and incompletely visualized due to patient motion. No significant stenosis is present on the right. Distal left vertebral artery is within normal limits. Skeleton: Degenerative changes cervical spine are most pronounced at C5-6 and to lesser degree at C6-7. No focal lytic or blastic lesions are present. No significant listhesis is present. There is straightening of the normal cervical lordosis. Other neck: Soft tissues the neck are otherwise unremarkable. Upper chest: The lung apices are clear. Thoracic inlet is within normal limits. Review of the MIP images confirms the above findings CTA HEAD FINDINGS Anterior circulation: Atherosclerotic calcifications are present within the cavernous internal carotid arteries bilaterally. Narrowing of approximately 50% is present in the proximal right cavernous ICA. The ICA termini are within normal limits bilaterally. The A1 and M1 segments are normal. The anterior communicating artery is patent. Moderate stenosis is present in the proximal superior left M2 segment. A high-grade stenosis is present in the anterior right A2 segment. Segmental narrowing is present throughout the distal MCA branches bilaterally. Posterior circulation: The right vertebral artery is dominant. PICA origins are visualized and within normal limits. Vertebrobasilar junction is normal. The basilar artery is normal. Both posterior cerebral arteries originate from the basilar tip. PCA branch vessels are within normal limits. Distal PCA segmental narrowing is present without a significant proximal stenosis or occlusion. Venous sinuses: The dural sinuses are patent. The straight sinus and deep cerebral veins are intact. Cortical  veins are unremarkable. Anatomic variants: None Review of the MIP images confirms the above findings IMPRESSION: 1. Stable appearance of acute/subacute posteromedial left cerebellar infarct. 2. Remote lacunar infarcts of the basal ganglia. 3. No acute cortical infarct. 4. Moderate stenosis of the proximal superior left M2 segment. 5. High-grade stenosis of the anterior right A2 segment. 6. Segmental narrowing throughout the distal MCA branches bilaterally. 7. Distal small vessel disease without a significant proximal stenosis, aneurysm, or branch vessel occlusion within the Circle of Willis. 8. Atherosclerotic changes at the carotid bifurcations bilaterally without significant stenosis. 9. Hypoplastic left vertebral artery is incompletely visualized due to patient motion. 10. Aortic Atherosclerosis (ICD10-I70.0). Electronically Signed   By: San Morelle M.D.   On: 11/26/2019 15:32   CT HEAD WO CONTRAST  Result Date: 11/21/2019 CLINICAL DATA:  Left side body numbness. History of HIV and hypertension EXAM: CT HEAD WITHOUT CONTRAST TECHNIQUE: Contiguous axial images were obtained from the base of the skull through the vertex without intravenous contrast. COMPARISON:  04/20/2017 FINDINGS:  Brain: No evidence of acute infarction, hemorrhage, hydrocephalus, extra-axial collection or mass lesion/mass effect. Interval but chronic appearing wedge-shaped infarct at the left stratum and internal capsule. Vascular: No hyperdense vessel or unexpected calcification. Skull: Normal. Negative for fracture or focal lesion. Sinuses/Orbits: No acute finding. IMPRESSION: 1. No acute finding. 2. 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 Electronically Signed   By: Monte Fantasia M.D.   On: 11/21/2019 04:07   CT ANGIO NECK W OR WO CONTRAST  Result Date: 11/26/2019 CLINICAL DATA:  Stroke, follow-up. Patient presented with left-sided weakness 5 days ago. Left cerebellar infarct on MRI.  EXAM: CT ANGIOGRAPHY HEAD AND NECK TECHNIQUE: Multidetector CT imaging of the head and neck was performed using the standard protocol during bolus administration of intravenous contrast. Multiplanar CT image reconstructions and MIPs were obtained to evaluate the vascular anatomy. Carotid stenosis measurements (when applicable) are obtained utilizing NASCET criteria, using the distal internal carotid diameter as the denominator. CONTRAST:  56mL OMNIPAQUE IOHEXOL 350 MG/ML SOLN COMPARISON:  None. FINDINGS: CT HEAD FINDINGS Brain: The acute/subacute posteromedial left cerebellar infarct is again seen. Remote lacunar infarcts are noted within the basal ganglia. No acute cortical infarcts are present. No progression is evident. Ventricles are of normal size. Insert pass fluid Vascular: Atherosclerotic calcifications are present within the cavernous internal carotid arteries bilaterally. No hyperdense vessel is present. Skull: Calvarium is intact. No focal lytic or blastic lesions are present. No significant extracranial soft tissue lesion is present. Sinuses: The paranasal sinuses and mastoid air cells are clear. Orbits: The globes and orbits are within normal limits. Review of the MIP images confirms the above findings CTA NECK FINDINGS Aortic arch: Atherosclerotic calcifications are present within the aortic arch and at the origin of the innominate artery and left subclavian artery. No significant stenosis or aneurysm is present. Right carotid system: Although the study is mildly degraded by patient motion. The right common carotid artery is within normal limits. Atherosclerotic calcification is present at the right bifurcation without significant stenosis. Mild tortuosity is present cervical right ICA without significant stenosis. Left carotid system: The left common carotid artery is distorted by motion. Atherosclerotic calcifications are present at the bifurcation without significant stenosis. Cervical left ICA is  within normal limits. Vertebral arteries: Right vertebral artery is the dominant vessel. Left vertebral artery is hypoplastic and incompletely visualized due to patient motion. No significant stenosis is present on the right. Distal left vertebral artery is within normal limits. Skeleton: Degenerative changes cervical spine are most pronounced at C5-6 and to lesser degree at C6-7. No focal lytic or blastic lesions are present. No significant listhesis is present. There is straightening of the normal cervical lordosis. Other neck: Soft tissues the neck are otherwise unremarkable. Upper chest: The lung apices are clear. Thoracic inlet is within normal limits. Review of the MIP images confirms the above findings CTA HEAD FINDINGS Anterior circulation: Atherosclerotic calcifications are present within the cavernous internal carotid arteries bilaterally. Narrowing of approximately 50% is present in the proximal right cavernous ICA. The ICA termini are within normal limits bilaterally. The A1 and M1 segments are normal. The anterior communicating artery is patent. Moderate stenosis is present in the proximal superior left M2 segment. A high-grade stenosis is present in the anterior right A2 segment. Segmental narrowing is present throughout the distal MCA branches bilaterally. Posterior circulation: The right vertebral artery is dominant. PICA origins are visualized and within normal limits. Vertebrobasilar junction is normal. The basilar artery is normal.  Both posterior cerebral arteries originate from the basilar tip. PCA branch vessels are within normal limits. Distal PCA segmental narrowing is present without a significant proximal stenosis or occlusion. Venous sinuses: The dural sinuses are patent. The straight sinus and deep cerebral veins are intact. Cortical veins are unremarkable. Anatomic variants: None Review of the MIP images confirms the above findings IMPRESSION: 1. Stable appearance of acute/subacute  posteromedial left cerebellar infarct. 2. Remote lacunar infarcts of the basal ganglia. 3. No acute cortical infarct. 4. Moderate stenosis of the proximal superior left M2 segment. 5. High-grade stenosis of the anterior right A2 segment. 6. Segmental narrowing throughout the distal MCA branches bilaterally. 7. Distal small vessel disease without a significant proximal stenosis, aneurysm, or branch vessel occlusion within the Circle of Willis. 8. Atherosclerotic changes at the carotid bifurcations bilaterally without significant stenosis. 9. Hypoplastic left vertebral artery is incompletely visualized due to patient motion. 10. Aortic Atherosclerosis (ICD10-I70.0). Electronically Signed   By: San Morelle M.D.   On: 11/26/2019 15:32   MR BRAIN WO CONTRAST  Result Date: 11/24/2019 CLINICAL DATA:  Stroke, follow-up. EXAM: MRI HEAD WITHOUT CONTRAST TECHNIQUE: Multiplanar, multiecho pulse sequences of the brain and surrounding structures were obtained without intravenous contrast. COMPARISON:  Head CT November 21, 2019. FINDINGS: The study is severely degraded by motion, most images are nondiagnostic. Brain: Focus of restricted diffusion within the left cerebellar hemisphere consistent with acute/subacute. There is hyperintensity the diffusion weighted sequence diffusely the right cerebellar hemisphere cortex and right temporoparietal and occipital cortex with no corresponding hyperintensity on the T2 sequence, may be artifactual. Hazy T2 hyperintensity within the white matter of the cerebral hemispheres, nonspecific. No large hemorrhage hydrocephalus, large extra-axial collection mass lesion. Vascular: Normal flow voids. Skull and upper cervical spine: Grossly unremarkable. Sinuses/Orbits: Grossly unremarkable. Other: None. IMPRESSION: 1. Severely motion degraded study, most images are nondiagnostic. 2. Focus of restricted diffusion within the left cerebellar hemisphere consistent with acute/subacute infarct.  3. Hyperintensity on diffusion weighted sequence diffusely in the right cerebellar hemisphere cortex and right temporoparietal and occipital cortex, likely artifactual. These results will be called to the ordering clinician or representative by the Radiologist Assistant, and communication documented in the PACS or [employer]. Electronically Signed   By: Pedro Earls M.D.   On: 11/24/2019 10:27   DG CHEST PORT 1 VIEW  Result Date: 11/24/2019 CLINICAL DATA:  Pneumonia.  Confusion. EXAM: PORTABLE CHEST 1 VIEW COMPARISON:  11/21/2019 FINDINGS: 0627 hours. Low volumes with asymmetric elevation right hemidiaphragm, similar to prior. Streaky opacity at the left base compatible with atelectasis, not substantially changed. No edema or pleural effusion. Cardiopericardial silhouette is at upper limits of normal for size. The visualized bony structures of the thorax show no acute abnormality. Telemetry leads overlie the chest. IMPRESSION: Low volume film with chronic atelectasis or scarring at the left base. Electronically Signed   By: Misty Stanley M.D.   On: 11/24/2019 07:45   DG Chest Port 1 View  Result Date: 11/21/2019 CLINICAL DATA:  Chest pain EXAM: PORTABLE CHEST 1 VIEW COMPARISON:  April 20, 2017 FINDINGS: The heart size and mediastinal contours are within normal limits. Streaky airspace opacities seen at the left lung base. The visualized skeletal structures are unremarkable. IMPRESSION: Streaky airspace opacity at the left lung base which could be due to atelectasis and/or infectious etiology. Electronically Signed   By: Prudencio Pair M.D.   On: 11/21/2019 03:21   ECHOCARDIOGRAM COMPLETE  Result Date: 11/21/2019    ECHOCARDIOGRAM REPORT  Patient Name:   Sharon Norris Date of Exam: 11/21/2019 Medical Rec #:  [id number]      Height:       63.0 in Accession #:    [id number]     Weight:       197.0 lb Date of Birth:  May 19, 1955       BSA:          1.921 m Patient Age:    39 years       BP:            155/88 mmHg Patient Gender: F              HR:           98 bpm. Exam Location:  Inpatient Procedure: 2D Echo STAT ECHO Indications:    acute coronary syndrome I24.9  History:        Patient has prior history of Echocardiogram examinations, most                 recent 05/26/2013. Risk Factors:Hypertension. HIV. former IV drug                 use.  Sonographer:    Administrator, sports RDCS (AE) Referring Phys: [phone] [facility]  Sonographer Comments: Technically difficult study due to poor echo windows, suboptimal apical window and suboptimal parasternal window. Image acquisition challenging due to patient body habitus and Image acquisition challenging due to respiratory motion. IMPRESSIONS  1. Mild intracavitary gradient. Peak velocity 1.78 m/s. Peak gradient 12.6 mmHg. Left ventricular ejection fraction, by estimation, is 60 to 65%. The left ventricle has normal function. The left ventricle has no regional wall motion abnormalities. There  is mild concentric left ventricular hypertrophy. Left ventricular diastolic parameters are consistent with Grade I diastolic dysfunction (impaired relaxation). Elevated left ventricular end-diastolic pressure.  2. Right ventricular systolic function is normal. The right ventricular size is normal. There is normal pulmonary artery systolic pressure.  3. The mitral valve is normal in structure. No evidence of mitral valve regurgitation. No evidence of mitral stenosis.  4. The aortic valve is normal in structure. Aortic valve regurgitation is not visualized. No aortic stenosis is present.  5. The inferior vena cava is normal in size with <50% respiratory variability, suggesting right atrial pressure of 8 mmHg. FINDINGS  Left Ventricle: Mild intracavitary gradient. Peak velocity 1.78 m/s. Peak gradient 12.6 mmHg. Left ventricular ejection fraction, by estimation, is 60 to 65%. The left ventricle has normal function. The left ventricle has no regional wall motion abnormalities.  The left ventricular internal cavity size was normal in size. There is mild concentric left ventricular hypertrophy. Left ventricular diastolic parameters are consistent with Grade I diastolic dysfunction (impaired relaxation). Elevated left ventricular end-diastolic pressure. Right Ventricle: The right ventricular size is normal. No increase in right ventricular wall thickness. Right ventricular systolic function is normal. There is normal pulmonary artery systolic pressure. The tricuspid regurgitant velocity is 2.24 m/s, and  with an assumed right atrial pressure of 8 mmHg, the estimated right ventricular systolic pressure is 41.3 mmHg. Left Atrium: Left atrial size was normal in size. Right Atrium: Right atrial size was normal in size. Pericardium: There is no evidence of pericardial effusion. Mitral Valve: The mitral valve is normal in structure. No evidence of mitral valve regurgitation. No evidence of mitral valve stenosis. Tricuspid Valve: The tricuspid valve is normal in structure. Tricuspid valve regurgitation is trivial. No evidence of tricuspid stenosis. Aortic Valve: The aortic valve is  normal in structure. Aortic valve regurgitation is not visualized. No aortic stenosis is present. Pulmonic Valve: The pulmonic valve was normal in structure. Pulmonic valve regurgitation is not visualized. No evidence of pulmonic stenosis. Aorta: The aortic root is normal in size and structure. Venous: The inferior vena cava is normal in size with less than 50% respiratory variability, suggesting right atrial pressure of 8 mmHg. IAS/Shunts: No atrial level shunt detected by color flow Doppler.  LEFT VENTRICLE PLAX 2D LVIDd:         4.50 cm  Diastology LVIDs:         2.80 cm  LV e' medial:    6.31 cm/s LV PW:         1.30 cm  LV E/e' medial:  14.3 LV IVS:        1.20 cm  LV e' lateral:   5.44 cm/s LVOT diam:     1.90 cm  LV E/e' lateral: 16.5 LVOT Area:     2.84 cm  LEFT ATRIUM         Index LA diam:    3.50 cm 1.82 cm/m    AORTA Ao Root diam: 2.60 cm MITRAL VALVE               TRICUSPID VALVE MV Area (PHT): 2.99 cm    TR Peak grad:   20.1 mmHg MV Decel Time: 254 msec    TR Vmax:        224.00 cm/s MV E velocity: 90.00 cm/s MV A velocity: 93.40 cm/s  SHUNTS MV E/A ratio:  0.96        Systemic Diam: 1.90 cm Skeet Latch MD Electronically signed by Skeet Latch MD Signature Date/Time: 11/21/2019/11:04:43 AM    Final    VAS [place] LOWER EXTREMITY VENOUS (DVT)  Result Date: 11/22/2019  Lower Venous DVT Study Indications: Edema.  Risk Factors: None identified. Limitations: Body habitus and poor ultrasound/tissue interface. Comparison Study: No prior studies. Performing Technologist: Oliver Hum RVT  Examination Guidelines: A complete evaluation includes B-mode imaging, spectral Doppler, color Doppler, and power Doppler as needed of all accessible portions of each vessel. Bilateral testing is considered an integral part of a complete examination. Limited examinations for reoccurring indications may be performed as noted. The reflux portion of the exam is performed with the patient in reverse Trendelenburg.  +---------+---------------+---------+-----------+----------+--------------+ RIGHT    CompressibilityPhasicitySpontaneityPropertiesThrombus Aging +---------+---------------+---------+-----------+----------+--------------+ CFV      Full           Yes      Yes                                 +---------+---------------+---------+-----------+----------+--------------+ SFJ      Full                                                        +---------+---------------+---------+-----------+----------+--------------+ FV Prox  Full                                                        +---------+---------------+---------+-----------+----------+--------------+ FV Mid   Full                                                        +---------+---------------+---------+-----------+----------+--------------+  FV DistalFull                                                        +---------+---------------+---------+-----------+----------+--------------+ PFV      Full                                                        +---------+---------------+---------+-----------+----------+--------------+ POP      Full           Yes      Yes                                 +---------+---------------+---------+-----------+----------+--------------+ PTV      Full                                                        +---------+---------------+---------+-----------+----------+--------------+ PERO                                                  Not visualized +---------+---------------+---------+-----------+----------+--------------+   +---------+---------------+---------+-----------+----------+--------------+ LEFT     CompressibilityPhasicitySpontaneityPropertiesThrombus Aging +---------+---------------+---------+-----------+----------+--------------+ CFV      Full           Yes      Yes                                 +---------+---------------+---------+-----------+----------+--------------+ SFJ      Full                                                        +---------+---------------+---------+-----------+----------+--------------+ FV Prox  Full                                                        +---------+---------------+---------+-----------+----------+--------------+ FV Mid   Full                                                        +---------+---------------+---------+-----------+----------+--------------+ FV DistalFull                                                        +---------+---------------+---------+-----------+----------+--------------+  PFV      Full                                                        +---------+---------------+---------+-----------+----------+--------------+ POP      Full           Yes      Yes                                  +---------+---------------+---------+-----------+----------+--------------+ PTV      Full                                                        +---------+---------------+---------+-----------+----------+--------------+ PERO                                                  Not visualized +---------+---------------+---------+-----------+----------+--------------+     Summary: RIGHT: - There is no evidence of deep vein thrombosis in the lower extremity. However, portions of this examination were limited- see technologist comments above.  - No cystic structure found in the popliteal fossa.  LEFT: - There is no evidence of deep vein thrombosis in the lower extremity. However, portions of this examination were limited- see technologist comments above.  - No cystic structure found in the popliteal fossa.  *See table(s) above for measurements and observations. Electronically signed by Harold Barban MD on 11/22/2019 at 9:05:04 PM.    Final    [place] EKG SITE RITE  Result Date: 11/22/2019 If Site Rite image not attached, placement could not be confirmed due to current cardiac rhythm.       The results of significant diagnostics from this hospitalization (including imaging, microbiology, ancillary and laboratory) are listed below for reference.     Microbiology: Recent Results (from the past 240 hour(s))  Respiratory Panel by RT PCR (Flu A&B, Covid) - Nasopharyngeal Swab     Status: None   Collection Time: 11/21/19  2:55 AM   Specimen: Nasopharyngeal Swab  Result Value Ref Range Status   SARS Coronavirus 2 by RT PCR NEGATIVE NEGATIVE Final    Comment: (NOTE) SARS-CoV-2 target nucleic acids are NOT DETECTED.  The SARS-CoV-2 RNA is generally detectable in upper respiratoy specimens during the acute phase of infection. The lowest concentration of SARS-CoV-2 viral copies this assay can detect is 131 copies/mL. A negative result does not preclude  SARS-Cov-2 infection and should not be used as the sole basis for treatment or other patient management decisions. A negative result may occur with  improper specimen collection/handling, submission of specimen other than nasopharyngeal swab, presence of viral mutation(s) within the areas targeted by this assay, and inadequate number of viral copies (<131 copies/mL). A negative result must be combined with clinical observations, patient history, and epidemiological information. The expected result is Negative.  Fact Sheet for Patients:  PinkCheek.be  Fact Sheet for Healthcare Providers:  GravelBags.it  This test is no t yet approved or cleared by the [place]  FDA and  has been authorized for detection and/or diagnosis of SARS-CoV-2 by FDA under an Emergency Use Authorization (EUA). This EUA will remain  in effect (meaning this test can be used) for the duration of the COVID-19 declaration under Section 564(b)(1) of the Act, 21 U.S.C. section 360bbb-3(b)(1), unless the authorization is terminated or revoked sooner.     Influenza A by PCR NEGATIVE NEGATIVE Final   Influenza B by PCR NEGATIVE NEGATIVE Final    Comment: (NOTE) The Xpert Xpress SARS-CoV-2/FLU/RSV assay is intended as an aid in  the diagnosis of influenza from Nasopharyngeal swab specimens and  should not be used as a sole basis for treatment. Nasal washings and  aspirates are unacceptable for Xpert Xpress SARS-CoV-2/FLU/RSV  testing.  Fact Sheet for Patients: PinkCheek.be  Fact Sheet for Healthcare Providers: GravelBags.it  This test is not yet approved or cleared by the [place] FDA and  has been authorized for detection and/or diagnosis of SARS-CoV-2 by  FDA under an Emergency Use Authorization (EUA). This EUA will remain  in effect (meaning this test can be used) for the duration of the   Covid-19 declaration under Section 564(b)(1) of the Act, 21  U.S.C. section 360bbb-3(b)(1), unless the authorization is  terminated or revoked. Performed at [facility], [place] [address] [place], [place] [address]   MRSA PCR Screening     Status: None   Collection Time: 11/21/19  6:15 PM   Specimen: Nasopharyngeal  Result Value Ref Range Status   MRSA by PCR NEGATIVE NEGATIVE Final    Comment:        The GeneXpert MRSA Assay (FDA approved for NASAL specimens only), is one component of a comprehensive MRSA colonization surveillance program. It is not intended to diagnose MRSA infection nor to guide or monitor treatment for MRSA infections. Performed at [facility], [place] [address], [place], [place] [address]      Labs: BNP (last 3 results) No results for input(s): BNP in the last 8760 hours. Basic Metabolic Panel: Recent Labs  Lab 11/25/19 1319 11/26/19 0256 11/28/19 0448 11/29/19 1026 11/30/19 0306  NA 141 142 143 141 140  K 4.2 3.4* 3.7 3.7 3.6  CL 110 111 116* 114* 115*  CO2 16* 18* 16* 16* 17*  GLUCOSE 96 86 118* 159* 102*  BUN 15 14 17 19 18   CREATININE 1.19* 1.22* 1.78* 1.48* 1.36*  CALCIUM 9.5 9.5 9.2 9.1 9.1  MG  --   --   --   --  1.9  PHOS  --   --   --   --  3.7   Liver Function Tests: Recent Labs  Lab 11/24/19 0034 11/25/19 1319 11/28/19 0448 11/29/19 1026 11/30/19 0306  AST 20 37 23 22 20   ALT 14 14 16 15 16   ALKPHOS 57 58 52 52 48  BILITOT 0.3 1.0 0.3 0.6 0.3  PROT 7.2 7.3 6.9 7.0 6.8  ALBUMIN 2.9* 3.0* 2.9* 3.0* 2.9*   No results for input(s): LIPASE, AMYLASE in the last 168 hours. Recent Labs  Lab 11/29/19 1459  AMMONIA 32   CBC: Recent Labs  Lab 11/25/19 1319 11/26/19 0256 11/28/19 0448 11/29/19 1026 11/30/19 0306  WBC 6.0 10.1 8.2 7.9 8.8  NEUTROABS 3.8 7.2 5.3 5.3 6.1  HGB 12.1 11.3* 9.8* 10.0* 9.5*  HCT 36.4 35.3* 31.2* 31.8* 29.4*  MCV 98.9 99.4 104.0* 100.3* 101.0*  PLT PLATELET CLUMPS  NOTED ON SMEAR, COUNT APPEARS DECREASED 273 257 273 245  Cardiac Enzymes: No results for input(s): CKTOTAL, CKMB, CKMBINDEX, TROPONINI in the last 168 hours. BNP: Invalid input(s): POCBNP CBG: Recent Labs  Lab 11/29/19 1557 11/29/19 2107 11/29/19 2341 11/30/19 0356 11/30/19 0737  GLUCAP 96 108* 83 104* 146*   D-Dimer No results for input(s): DDIMER in the last 72 hours. Hgb A1c No results for input(s): HGBA1C in the last 72 hours. Lipid Profile No results for input(s): CHOL, HDL, LDLCALC, TRIG, CHOLHDL, LDLDIRECT in the last 72 hours. Thyroid function studies No results for input(s): TSH, T4TOTAL, T3FREE, THYROIDAB in the last 72 hours.  Invalid input(s): FREET3 Anemia work up No results for input(s): VITAMINB12, FOLATE, FERRITIN, TIBC, IRON, RETICCTPCT in the last 72 hours. Urinalysis    Component Value Date/Time   COLORURINE YELLOW 11/23/2019 1629   APPEARANCEUR CLEAR 11/23/2019 1629   LABSPEC 1.012 11/23/2019 1629   PHURINE 5.0 11/23/2019 1629   GLUCOSEU NEGATIVE 11/23/2019 1629   GLUCOSEU NEG mg/dL 12/12/2007 2026   HGBUR NEGATIVE 11/23/2019 1629   BILIRUBINUR NEGATIVE 11/23/2019 1629   KETONESUR NEGATIVE 11/23/2019 1629   PROTEINUR 30 (A) 11/23/2019 1629   UROBILINOGEN 1.0 05/25/2013 1940   NITRITE NEGATIVE 11/23/2019 1629   LEUKOCYTESUR NEGATIVE 11/23/2019 1629   Sepsis Labs Invalid input(s): PROCALCITONIN,  WBC,  LACTICIDVEN   Time coordinating discharge: 35 minutes  SIGNED:  Mercy Riding, MD  [facility] 11/30/2019, 4:04 PM  If 7PM-7AM, please contact night-coverage [email or web address]

## 2019-12-01 ENCOUNTER — Telehealth: Payer: Self-pay | Admitting: Internal Medicine

## 2019-12-01 NOTE — Telephone Encounter (Signed)
RTC to Cecille Rubin, nurse with [facility].  She is requesting VO for [facility] nursing. This RN asked [facility] nurse if this is r/t pt's last hospital stay, [facility] nurse states yes.  This RN informed [facility] nurse that patient has not been seen in our office since 03/2019 and per chart review, was under the care of attending Dr. Wendee Beavers at last hospital stay.  [facility] nurse confirms that orders for [facility] evaluation was received by Dr. Cyndia Skeeters.   This RN instructed [facility] nurse if she could not obtain further [place] orders from Dr. Cyndia Skeeters, patient would need an appt to be seen at [facility].  SChaplin, RN,BSN

## 2019-12-01 NOTE — Telephone Encounter (Signed)
[facility] Nurse requesting a VO for 1 week 4 and 2PRN for nursing. Please call back.

## 2019-12-04 ENCOUNTER — Telehealth: Payer: Self-pay

## 2019-12-04 NOTE — Telephone Encounter (Signed)
Hospital TOC appt 12/08/2019.

## 2019-12-06 ENCOUNTER — Telehealth: Payer: Self-pay | Admitting: *Deleted

## 2019-12-06 NOTE — Telephone Encounter (Signed)
Katie, RN with [facility] called to let PCP know that patient has declined [facility] RN services but has agreed to [facility] PT/OT. Hubbard Hartshorn, BSN, RN-BC

## 2019-12-06 NOTE — Telephone Encounter (Signed)
Called pt - no answer; left message to call the office . 

## 2019-12-06 NOTE — Telephone Encounter (Signed)
Thank you :)

## 2019-12-08 ENCOUNTER — Encounter: Payer: Self-pay | Admitting: General Practice

## 2019-12-08 ENCOUNTER — Encounter: Payer: Medicare Other | Admitting: Internal Medicine

## 2019-12-12 NOTE — Telephone Encounter (Signed)
Called pt - no answer. HFU appt has been changed to 10/28.

## 2019-12-14 ENCOUNTER — Encounter: Payer: Medicare Other | Admitting: Internal Medicine

## 2020-01-03 ENCOUNTER — Inpatient Hospital Stay: Payer: Medicare Other | Admitting: Adult Health

## 2020-01-17 ENCOUNTER — Encounter: Payer: Medicare Other | Admitting: Internal Medicine

## 2020-01-25 ENCOUNTER — Other Ambulatory Visit: Payer: Self-pay | Admitting: Internal Medicine

## 2020-01-25 DIAGNOSIS — I10 Essential (primary) hypertension: Secondary | ICD-10-CM

## 2020-01-26 NOTE — Telephone Encounter (Signed)
This does not appear on med list. Pt needs appt. Spoke to doriss. Who will try to make contact and sch appt asap

## 2020-01-29 ENCOUNTER — Other Ambulatory Visit: Payer: Self-pay | Admitting: Internal Medicine

## 2020-01-29 NOTE — Telephone Encounter (Signed)
Contacted patient to schedule an appointment. Stated will call me back, because due to transportation would like to schedule same day as Dr. Algis Downs office is possible.  Gave her the dates Dr. Eileen Stanford will be here this month and told her to call back to schedule an appointment.  Forwarding back to yellow team.

## 2020-05-22 ENCOUNTER — Ambulatory Visit (INDEPENDENT_AMBULATORY_CARE_PROVIDER_SITE_OTHER): Payer: Medicare Other | Admitting: Student

## 2020-05-22 ENCOUNTER — Ambulatory Visit (HOSPITAL_COMMUNITY): Admit: 2020-05-22 | Discharge: 2020-05-22 | Disposition: A | Discharge status: Home / Self Care | Payer: Medicare Other

## 2020-05-22 ENCOUNTER — Other Ambulatory Visit: Payer: Self-pay

## 2020-05-22 ENCOUNTER — Emergency Department (HOSPITAL_COMMUNITY): Payer: Medicare Other

## 2020-05-22 ENCOUNTER — Emergency Department (HOSPITAL_COMMUNITY)
Admission: EM | Admit: 2020-05-22 | Discharge: 2020-05-22 | Discharge status: Against Medical Advice | Payer: Medicare Other | Attending: Emergency Medicine | Admitting: Emergency Medicine

## 2020-05-22 ENCOUNTER — Encounter: Payer: Self-pay | Admitting: Student

## 2020-05-22 VITALS — BP 211/69 | HR 65 | Temp 98.1°F | Ht 63.0 in | Wt 190.2 lb

## 2020-05-22 DIAGNOSIS — Z21 Asymptomatic human immunodeficiency virus [HIV] infection status: Secondary | ICD-10-CM | POA: Insufficient documentation

## 2020-05-22 DIAGNOSIS — R4781 Slurred speech: Secondary | ICD-10-CM | POA: Insufficient documentation

## 2020-05-22 DIAGNOSIS — J9811 Atelectasis: Secondary | ICD-10-CM | POA: Diagnosis not present

## 2020-05-22 DIAGNOSIS — N184 Chronic kidney disease, stage 4 (severe): Secondary | ICD-10-CM | POA: Insufficient documentation

## 2020-05-22 DIAGNOSIS — B20 Human immunodeficiency virus [HIV] disease: Secondary | ICD-10-CM

## 2020-05-22 DIAGNOSIS — I161 Hypertensive emergency: Secondary | ICD-10-CM | POA: Diagnosis not present

## 2020-05-22 DIAGNOSIS — I10 Essential (primary) hypertension: Secondary | ICD-10-CM | POA: Diagnosis not present

## 2020-05-22 DIAGNOSIS — Z79899 Other long term (current) drug therapy: Secondary | ICD-10-CM | POA: Insufficient documentation

## 2020-05-22 DIAGNOSIS — Z87891 Personal history of nicotine dependence: Secondary | ICD-10-CM | POA: Diagnosis not present

## 2020-05-22 DIAGNOSIS — Z7982 Long term (current) use of aspirin: Secondary | ICD-10-CM | POA: Diagnosis not present

## 2020-05-22 DIAGNOSIS — R519 Headache, unspecified: Secondary | ICD-10-CM | POA: Diagnosis not present

## 2020-05-22 DIAGNOSIS — I214 Non-ST elevation (NSTEMI) myocardial infarction: Secondary | ICD-10-CM

## 2020-05-22 DIAGNOSIS — R0789 Other chest pain: Secondary | ICD-10-CM | POA: Insufficient documentation

## 2020-05-22 DIAGNOSIS — I129 Hypertensive chronic kidney disease with stage 1 through stage 4 chronic kidney disease, or unspecified chronic kidney disease: Secondary | ICD-10-CM | POA: Insufficient documentation

## 2020-05-22 DIAGNOSIS — R42 Dizziness and giddiness: Secondary | ICD-10-CM | POA: Insufficient documentation

## 2020-05-22 DIAGNOSIS — H532 Diplopia: Secondary | ICD-10-CM | POA: Insufficient documentation

## 2020-05-22 DIAGNOSIS — R079 Chest pain, unspecified: Secondary | ICD-10-CM

## 2020-05-22 DIAGNOSIS — R4182 Altered mental status, unspecified: Secondary | ICD-10-CM | POA: Insufficient documentation

## 2020-05-22 DIAGNOSIS — R0602 Shortness of breath: Secondary | ICD-10-CM | POA: Diagnosis not present

## 2020-05-22 DIAGNOSIS — R202 Paresthesia of skin: Secondary | ICD-10-CM | POA: Diagnosis not present

## 2020-05-22 DIAGNOSIS — F5104 Psychophysiologic insomnia: Secondary | ICD-10-CM

## 2020-05-22 LAB — BASIC METABOLIC PANEL
Anion gap: 12 (ref 5–15)
BUN: 19 mg/dL (ref 8–23)
CO2: 18 mmol/L — ABNORMAL LOW (ref 22–32)
Calcium: 10 mg/dL (ref 8.9–10.3)
Chloride: 108 mmol/L (ref 98–111)
Creatinine, Ser: 1.24 mg/dL — ABNORMAL HIGH (ref 0.44–1.00)
GFR, Estimated: 48 mL/min — ABNORMAL LOW (ref >60)
Glucose, Bld: 103 mg/dL — ABNORMAL HIGH (ref 70–99)
Potassium: 4.2 mmol/L (ref 3.5–5.1)
Sodium: 138 mmol/L (ref 135–145)

## 2020-05-22 LAB — POCT I-STAT, CHEM 8
BUN: 21 mg/dL (ref 8–23)
Calcium, Ion: 1.1 mmol/L — ABNORMAL LOW (ref 1.15–1.40)
Chloride: 110 mmol/L (ref 98–111)
Creatinine, Ser: 1.3 mg/dL — ABNORMAL HIGH (ref 0.44–1.00)
Glucose, Bld: 103 mg/dL — ABNORMAL HIGH (ref 70–99)
HCT: 42 % (ref 36.0–46.0)
Hemoglobin: 14.3 g/dL (ref 12.0–15.0)
Potassium: 4.2 mmol/L (ref 3.5–5.1)
Sodium: 140 mmol/L (ref 135–145)
TCO2: 20 mmol/L — ABNORMAL LOW (ref 22–32)

## 2020-05-22 LAB — DIFFERENTIAL
Abs Immature Granulocytes: 0.03 10*3/uL (ref 0.00–0.07)
Basophils Absolute: 0 10*3/uL (ref 0.0–0.1)
Basophils Relative: 1 %
Eosinophils Absolute: 0 10*3/uL (ref 0.0–0.5)
Eosinophils Relative: 0 %
Immature Granulocytes: 0 %
Lymphocytes Relative: 25 %
Lymphs Abs: 2.1 10*3/uL (ref 0.7–4.0)
Monocytes Absolute: 0.6 10*3/uL (ref 0.1–1.0)
Monocytes Relative: 8 %
Neutro Abs: 5.4 10*3/uL (ref 1.7–7.7)
Neutrophils Relative %: 66 %

## 2020-05-22 LAB — CBC
HCT: 43.8 % (ref 36.0–46.0)
Hemoglobin: 14 g/dL (ref 12.0–15.0)
MCH: 32.9 pg (ref 26.0–34.0)
MCHC: 32 g/dL (ref 30.0–36.0)
MCV: 102.8 fL — ABNORMAL HIGH (ref 80.0–100.0)
Platelets: 227 10*3/uL (ref 150–400)
RBC: 4.26 MIL/uL (ref 3.87–5.11)
RDW: 13.2 % (ref 11.5–15.5)
WBC: 8.1 10*3/uL (ref 4.0–10.5)
nRBC: 0 % (ref 0.0–0.2)

## 2020-05-22 LAB — APTT: aPTT: 33 seconds (ref 24–36)

## 2020-05-22 LAB — RAPID URINE DRUG SCREEN, HOSP PERFORMED
Amphetamines: NOT DETECTED
Barbiturates: NOT DETECTED
Benzodiazepines: NOT DETECTED
Cocaine: POSITIVE — AB
Opiates: NOT DETECTED
Tetrahydrocannabinol: NOT DETECTED

## 2020-05-22 LAB — TROPONIN I (HIGH SENSITIVITY): Troponin I (High Sensitivity): 396 ng/L (ref <18)

## 2020-05-22 LAB — PROTIME-INR
INR: 1 (ref 0.8–1.2)
Prothrombin Time: 12.9 seconds (ref 11.4–15.2)

## 2020-05-22 MED ORDER — ASPIRIN 81 MG PO CHEW
324.0000 mg | CHEWABLE_TABLET | Freq: Once | ORAL | Status: AC
  Administered 2020-05-22: 324 mg via ORAL
  Filled 2020-05-22: qty 4

## 2020-05-22 MED ORDER — FENTANYL CITRATE (PF) 100 MCG/2ML IJ SOLN
50.0000 ug | Freq: Once | INTRAMUSCULAR | Status: DC
  Filled 2020-05-22: qty 2

## 2020-05-22 MED ORDER — SODIUM CHLORIDE 0.9% FLUSH
3.0000 mL | Freq: Once | INTRAVENOUS | Status: DC
Start: 2020-05-22 — End: 2020-05-23

## 2020-05-22 NOTE — ED Provider Notes (Signed)
[name] EMERGENCY DEPARTMENT Provider Note   CSN: [id number] Arrival date & time: 05/22/20  1540     History Chief Complaint  Patient presents with  . Chest Pain  . Aphasia    Sharon Norris is a 65 y.o. female.  HPI Patient presents with chest pain numbness tingling vision changes and sometimes slurred speech.  Sent in from internal medicine clinic.  Has had symptoms since Saturday or Sunday.  States has severe tightness in her chest.  States also pain goes down the left arm.  States there is some tingling in the arm.  Also feels dizzy.  Feels as if things are moving around.  States that has a dull headache.  Has been unsteady also since this weekend.  Today is Wednesday.  Had been seen in the clinic and sent here for further evaluation.  Had been seen in triage and code stroke called although canceled upon my seeing her since symptoms have been there since Saturday.  Patient states she has double vision.  States she sees to me.  States if she closes either eye she also will still see 2 of me.  Patient states that her blood pressure is very high.    Past Medical History:  Diagnosis Date  . Anxiety   . Bronchitis   . Depression   . Excessive cerumen in both ear canals 07/02/2017  . H/O drug abuse ([place])    IV drugs, drug free 12 years  . Hepatitis    Hep. C  . History of suicidal ideation    thought about overdose in the past  . HIV infection ([place])   . Hypertension   . Neuropathy   . Sleep difficulties   . Trichomonal infection    2009    Patient Active Problem List   Diagnosis Date Noted  . Hypertensive emergency 05/22/2020  . Cerebral embolism with cerebral infarction 11/27/2019  . NSTEMI (non-ST elevated myocardial infarction) ([place]) 11/21/2019  . SIRS (systemic inflammatory response syndrome) ([place]) 11/21/2019  . Numbness on left side 11/21/2019  . Prolonged QT interval 11/21/2019  . Hyperglycemia 11/21/2019  . Pneumonia of left lower lobe due to  infectious organism   . Atypical chest pain/L. Shoulder pain 04/10/2019  . CKD (chronic kidney disease), stage IV ([place]) 08/25/2018  . Constipated 06/28/2018  . Fall 07/13/2017  . Poor dentition 04/08/2016  . Health care maintenance 09/05/2013  . Psychiatric disorder 09/17/2010  . Peripheral neuropathy ([place]) 04/02/2009  . Insomnia 01/15/2009  . Chronic back pain 12/12/2007  . Human immunodeficiency virus (HIV) disease ([place]) 12/23/2005  . History of Chronic hepatitis C without hepatic coma ([place]) 12/23/2005  . Essential hypertension 12/23/2005    Past Surgical History:  Procedure Laterality Date  . COLPOSCOPY  2010     OB History    Gravida  3   Para  2   Term  2   Preterm      AB  1   Living        SAB      IAB  1   Ectopic      Multiple      Live Births              Family History  Problem Relation Age of Onset  . Alcohol abuse Father   . Cancer Maternal Aunt        endometrial  . Cancer Sister        endometrial  . Cancer Sister  breast    Social History   Tobacco Use  . Smoking status: Former Smoker    Quit date: 02/17/2000    Years since quitting: 20.2  . Smokeless tobacco: Never Used  Vaping Use  . Vaping Use: Never used  Substance Use Topics  . Alcohol use: Yes    Alcohol/week: 0.0 standard drinks    Comment: beer, occasional  . Drug use: No    Home Medications Prior to Admission medications   Medication Sig Start Date End Date Taking? Authorizing Provider  ramelteon (ROZEREM) 8 MG tablet Take 1 tablet (8 mg total) by mouth at bedtime as needed for sleep. 05/23/20 05/23/21  Jeralyn Bennett, MD  abacavir-dolutegravir-lamiVUDine (TRIUMEQ) [id number] MG tablet Take 1 tablet by mouth daily. 05/23/20   Jeralyn Bennett, MD  amLODipine (NORVASC) 10 MG tablet Take 1 tablet (10 mg total) by mouth daily. 05/23/20   Jeralyn Bennett, MD  aspirin EC 81 MG tablet Take 1 tablet (81 mg total) by mouth daily. Swallow whole. 05/23/20   Jeralyn Bennett, MD  atorvastatin (LIPITOR) 80 MG tablet Take 1 tablet (80 mg total) by mouth daily. 05/23/20   Jeralyn Bennett, MD  carvedilol (COREG) 25 MG tablet Take 1 tablet (25 mg total) by mouth 2 (two) times daily with a meal. 05/23/20   Jeralyn Bennett, MD  divalproex (DEPAKOTE ER) 500 MG 24 hr tablet Take 1 tablet (500 mg total) by mouth daily. 05/23/20   Jeralyn Bennett, MD  gabapentin (NEURONTIN) 300 MG capsule Take 2 capsules (600 mg total) by mouth 2 (two) times daily. 05/23/20   Jeralyn Bennett, MD  isosorbide mononitrate (IMDUR) 60 MG 24 hr tablet Take 1 tablet (60 mg total) by mouth daily. 05/23/20   Jeralyn Bennett, MD  Multiple Vitamin (MULTIVITAMIN WITH MINERALS) TABS tablet Take 1 tablet by mouth daily.    [provider]  nicotine (NICODERM CQ - DOSED IN MG/24 HOURS) 14 mg/24hr patch Place 1 patch (14 mg total) onto the skin daily. 11/30/19   Mercy Riding, MD    Allergies    Benadryl [diphenhydramine hcl], Ribavirin, and Viekira pak [ombitasvir-paritaprevir-ritonavir-dasabuvir]  Review of Systems   Review of Systems  Constitutional: Negative for appetite change, fatigue and fever.  HENT: Negative for congestion.   Respiratory: Positive for cough and chest tightness.   Cardiovascular: Positive for chest pain and leg swelling.  Gastrointestinal: Negative for abdominal distention.  Genitourinary: Negative for flank pain.  Musculoskeletal: Negative for back pain.  Skin: Negative for rash.  Neurological: Positive for dizziness, speech difficulty and headaches.  Psychiatric/Behavioral: Negative for confusion.    Physical Exam Updated Vital Signs BP (!) 200/182   Pulse (!) 57   Temp 98.2 F (36.8 C) (Oral)   Resp (!) 22   SpO2 98%   Physical Exam Vitals and nursing note reviewed.  HENT:     Head: Normocephalic.  Eyes:     Extraocular Movements: Extraocular movements intact.     Pupils: Pupils are equal, round, and reactive to light.  Cardiovascular:     Rate and  Rhythm: Normal rate and regular rhythm.  Pulmonary:     Breath sounds: No wheezing, rhonchi or rales.  Chest:     Chest wall: Tenderness present.     Comments: Tenderness to anterior chest and left shoulder. Abdominal:     Tenderness: There is no abdominal tenderness.  Musculoskeletal:     Right lower leg: No edema.     Left lower leg: No edema.  Skin:  Capillary Refill: Capillary refill takes less than 2 seconds.  Neurological:     Mental Status: She is alert and oriented to person, place, and time.     ED Results / Procedures / Treatments   Labs (all labs ordered are listed, but only abnormal results are displayed) Labs Reviewed  BASIC METABOLIC PANEL - Abnormal; Notable for the following components:      Result Value   CO2 18 (*)    Glucose, Bld 103 (*)    Creatinine, Ser 1.24 (*)    GFR, Estimated 48 (*)    All other components within normal limits  CBC - Abnormal; Notable for the following components:   MCV 102.8 (*)    All other components within normal limits  RAPID URINE DRUG SCREEN, HOSP PERFORMED - Abnormal; Notable for the following components:   Cocaine POSITIVE (*)    All other components within normal limits  TROPONIN I (HIGH SENSITIVITY) - Abnormal; Notable for the following components:   Troponin I (High Sensitivity) 396 (*)    All other components within normal limits  PROTIME-INR  APTT  DIFFERENTIAL  CBG MONITORING, ED  I-STAT CHEM 8, ED  CBG MONITORING, ED  TROPONIN I (HIGH SENSITIVITY)    EKG EKG Interpretation  Date/Time:  Wednesday May 22 2020 17:50:32 EDT Ventricular Rate:  57 PR Interval:  167 QRS Duration: 83 QT Interval:  430 QTC Calculation: 419 R Axis:   42 Text Interpretation: Age not entered, assumed to be  65 years old for purpose of ECG interpretation Sinus rhythm Left atrial enlargement Borderline abnrm T, anterolateral leads No significant change since last tracing Confirmed by Davonna Belling [phone]) on 05/22/2020 5:54:22  PM Also confirmed by Davonna Belling [phone]), editor [place], [place] [phone])  on 05/23/2020 9:04:41 AM   Radiology DG Chest Portable 1 View  Result Date: 05/22/2020 CLINICAL DATA:  Shortness of breath. EXAM: PORTABLE CHEST 1 VIEW COMPARISON:  November 24, 2019. FINDINGS: The heart size and mediastinal contours are within normal limits. No pneumothorax or pleural effusion is noted. Right lung is clear. Minimal left basilar subsegmental atelectasis is noted. The visualized skeletal structures are unremarkable. IMPRESSION: Minimal left basilar subsegmental atelectasis. Electronically Signed   By: Marijo Conception M.D.   On: 05/22/2020 16:40    Procedures Procedures   Medications Ordered in ED Medications  aspirin chewable tablet 324 mg (324 mg Oral Given 05/22/20 1738)    ED Course  I have reviewed the triage vital signs and the nursing notes.  Pertinent labs & imaging results that were available during my care of the patient were reviewed by me and considered in my medical decision making (see chart for details).    MDM Rules/Calculators/A&P                          Patient presented with chest pain and had some difficulty speaking.  High blood pressure.  However with potential neuro deficits not overly aggressively controlled.  Patient did have some difficulty getting IV access.  Had been given some pain medicines.  Blood work done.  However patient was not willing to stay to complete her work-up.  Left AMA.  Patient's troponin was mildly elevated but was lower than it had been previously.  I had been worried for aortic dissection but had not been able to get CTA before she left AMA.  Of note her urine did show cocaine again. Final Clinical Impression(s) / ED Diagnoses Final  diagnoses:  Chest pain, unspecified type    Rx / DC Orders ED Discharge Orders    None       Davonna Belling, MD 05/24/20 [phone]

## 2020-05-22 NOTE — Assessment & Plan Note (Signed)
Patient continues to endorse troubles sleeping at night.   - Would likely benefit from medication in the short term to reduce anxiety and assist with sleep

## 2020-05-22 NOTE — ED Notes (Signed)
IV team at bedside 

## 2020-05-22 NOTE — ED Provider Notes (Cosign Needed Addendum)
Patient placed in Quick Look pathway, seen and evaluated   Chief Complaint: left arm pain, shortness of breath and chest pain for 4 days.  Pt reports difficulty speaking for 2 hours.  Pt has previous cva. Pt seen in clinic and sent here for evaluation.  Possible CVA  HPI:   Chest pain  ROS: no fever or cough  Physical Exam: difficulty speaking  Gen: No distress  Neuro: Awake and Alert  Skin: Warm    Focused Exam: Lungs clear heart rrr   Initiation of care has begun. The patient has been counseled on the process, plan, and necessity for staying for the completion/evaluation, and the remainder of the medical screening examination   Sidney Ace 05/22/20 1555    [address] PA-C 05/22/20 1603    Fransico Meadow, [place] 05/22/20 1605

## 2020-05-22 NOTE — ED Notes (Signed)
Pt c/o mid-chest pain radiating into left arm and dizziness that started two days ago.

## 2020-05-22 NOTE — Assessment & Plan Note (Signed)
Patient's blood pressure today is 211/69. She ran out of all her antihypertensives and other medications aside from HIV medications and cholesterol medication recently. Her typical CP and vertigo with left-sided CN V and 8 as well as Vertigo and dysmetria are concerning for hypertensive emergency with cardiac and neurological involvement. EKG did show ST elevations in V1 which are new since previous EKG although did not show consecutive T wave elevations consistent with STEMI. She does have signs and symptoms of posterior CVA and had an embolic CVA in the recent past.   - Patient was sent immediately to the ED following evaluation for further workup

## 2020-05-22 NOTE — ED Notes (Signed)
Code Stroke cancelled per Dr. Alvino Chapel

## 2020-05-22 NOTE — ED Notes (Signed)
Pt awaiting IV team for IV start, attempt by RN x2, unsuccessful. Pt c/o pain, states she wants to leave, educated pt on reasons for staying. IV team paged.

## 2020-05-22 NOTE — Progress Notes (Signed)
Internal Medicine Clinic Attending  I saw and evaluated the patient.  I personally confirmed the key portions of the history and exam documented by Dr. Konrad Penta and I reviewed pertinent patient test results.  The assessment, diagnosis, and plan were formulated together and I agree with the documentation in the resident's note.  Patient living with ischemic vascular disease who came to clinic with three days of accelerating angina, dizziness, and facial numbness. EKG shows ST changes in V1, and non-specific T wave inversions, does not meet criteria for STEMI. She has been without medical management for many months, at risk for another ischemic event today. Differential includes cerebral ischemia, NSTEMI/UA, or hypertensive emergency. We have brought the patient to the ED and recommend usual labs and brain imaging, out of the time window for lytic therapy.

## 2020-05-22 NOTE — ED Triage Notes (Signed)
Pt was in the clinic down stairs and started having chest discomfort and slurred speech. pt said she has SOB and cant get her words out correctly. Pt's BP is very high in triage. Pain in her chest is in the left side and runs up into her left arm. All grips are equal

## 2020-05-22 NOTE — Assessment & Plan Note (Signed)
Patient states that she continues to follow regularly with Dr. Johnnye Sima with ID and has been compliant with her HIV medications.   - Continue ID follow up

## 2020-05-22 NOTE — Assessment & Plan Note (Deleted)
Patient's blood pressure today is 211/69. She ran out of all her antihypertensives recently. Her typical CP and vertigo/dysmetria are concerning for hypertensive emergency with cardiac and neurological involvement.   - Will send to the ED for acute management

## 2020-05-22 NOTE — Progress Notes (Signed)
   CC: Chest Pain   HPI:  [name] is a 64 y.o. lady w/ PMHx NSTEMI, Embolic CVA, HTN, HIV, CKD stage IV, prolonged QTc, polysubstance abuse and chronic HCV, both in remission, presenting with chief complaint of CP. She has had burning CP in the center of her chest with minimal exertion associated with achy pain that radiates down her left arm, shortness of breath, diaphoresis, and dizziness over the past 3 days which resolves when she is completely still. She describes her dizziness as vertigo that occurs with movement of her eyes and also with exertion. At baseline, she is able to walk with a cane without symptoms although struggles with severe pain and numbness of her bilateral LE's that she attributes to neuropathy. She notes she ran out of all her medication aside from cholesterol medication recently. She denies any history of heartburn, nausea, vomiting, abdominal pain, pleuritic pain, lower extremity swelling, focal numbness or weakness, or any other symptoms.   Past Medical History:  Diagnosis Date  . Anxiety   . Bronchitis   . Depression   . Excessive cerumen in both ear canals 07/02/2017  . H/O drug abuse ([place])    IV drugs, drug free 12 years  . Hepatitis    Hep. C  . History of suicidal ideation    thought about overdose in the past  . HIV infection ([place])   . Hypertension   . Neuropathy   . Sleep difficulties   . Trichomonal infection    2009   Review of Systems:  10-point review of systems otherwise negative except as noted above in HPI.   Physical Exam:  Vitals:   05/22/20 1423  BP: (!) 211/69  Pulse: 65  Temp: 98.1 F (36.7 C)  TempSrc: Oral  SpO2: 100%  Weight: 190 lb 3.2 oz (86.3 kg)  Height: '5\' 3"'$  (1.6 m)   General: Patient is obese and uncomfortable although in no acute distress.  Eyes: Sclera non-icteric. No conjunctival injection.  HENT: MMM. No nasal discharge. Respiratory: Lungs are CTA, bilaterally. No wheezes, rales, or rhonchi. Breathing  comfortably on room air.  Cardiovascular: Regular rate and rhythm. No murmurs, rubs, or gallops. No lower extremity edema. Extremities are warm and well perfused.  Neurological: Patient has decreased sensation to light touch on the left side of her face compared to the right. Her hearing is diminished on the left compared to the right. She has subjective vertigo without nystagmus on left lateral gaze. CN II-XII are otherwise intact. She has dysmetria on finger to nose testing bilaterally. She has mild expressive aphasia (slow to form words). Her strength throughout her bilateral lower extremities is 4/5. Strength in her upper extremities is 4+/5 bilaterally. Musculoskeletal: Decreased muscle bulk present with mild diffuse cachexia of the extremities. There is diffusely decreased muscle tone in bilateral lower extremities.  Abdominal: Obese. Soft and non-tender to palpation. Bowel sounds intact. No rebound or guarding. Skin: No lesions. No rashes.  Psych: Normal affect. Normal tone of voice.   Assessment & Plan:   See Encounters Tab for problem based charting.  Patient seen with Dr. Evette Doffing.  Jeralyn Bennett, MD 05/22/2020, 3:58 PM Pager: [phone]

## 2020-05-22 NOTE — ED Notes (Addendum)
Pt informed IV team that she is leaving because she does not want to be stuck anymore, pt understands risk of leaving, MD notified, pt seen getting dressed and seen walking out of room. Refused vitals

## 2020-05-22 NOTE — Progress Notes (Signed)
Patient transported to ED via w/c. Presented to Charge RN who had spoken with Dr. Konrad Penta. States patient outside of window for Code Stroke as last known well was 3 days ago. Presented EKG with notes from Attending that outlined positive changes and active CP. Was instructed to present to Triage desk in waiting room. Hand off given to Saralyn Pilar, [place] who took patient back to ED. Drs. Evette Doffing and [place] made aware. Hubbard Hartshorn, BSN, RN-BC

## 2020-05-23 ENCOUNTER — Other Ambulatory Visit: Payer: Self-pay | Admitting: Student

## 2020-05-23 ENCOUNTER — Telehealth: Payer: Self-pay | Admitting: Student

## 2020-05-23 DIAGNOSIS — B20 Human immunodeficiency virus [HIV] disease: Secondary | ICD-10-CM

## 2020-05-23 DIAGNOSIS — G620 Drug-induced polyneuropathy: Secondary | ICD-10-CM

## 2020-05-23 DIAGNOSIS — F99 Mental disorder, not otherwise specified: Secondary | ICD-10-CM

## 2020-05-23 MED ORDER — ATORVASTATIN CALCIUM 80 MG PO TABS: 80.0000 mg | ORAL_TABLET | Freq: Every day | ORAL | 1 refills | Status: DC

## 2020-05-23 MED ORDER — TRIUMEQ 600-50-300 MG PO TABS: 1.0000 | ORAL_TABLET | Freq: Every day | ORAL | 4 refills | Status: DC

## 2020-05-23 MED ORDER — ASPIRIN EC 81 MG PO TBEC: 81.0000 mg | DELAYED_RELEASE_TABLET | Freq: Every day | ORAL | 1 refills | Status: DC

## 2020-05-23 MED ORDER — RAMELTEON 8 MG PO TABS: 8.0000 mg | ORAL_TABLET | Freq: Every evening | ORAL | 1 refills | Status: DC | PRN

## 2020-05-23 MED ORDER — GABAPENTIN 300 MG PO CAPS: 2.0000 | ORAL_CAPSULE | Freq: Two times a day (BID) | ORAL | 1 refills | Status: DC

## 2020-05-23 MED ORDER — CARVEDILOL 25 MG PO TABS: 25.0000 mg | ORAL_TABLET | Freq: Two times a day (BID) | ORAL | 1 refills | Status: DC

## 2020-05-23 MED ORDER — ISOSORBIDE MONONITRATE ER 60 MG PO TB24: 60.0000 mg | ORAL_TABLET | Freq: Every day | ORAL | 1 refills | Status: DC

## 2020-05-23 MED ORDER — DIVALPROEX SODIUM ER 500 MG PO TB24: 500.0000 mg | ORAL_TABLET | Freq: Every day | ORAL | 1 refills | Status: DC

## 2020-05-23 MED ORDER — AMLODIPINE BESYLATE 10 MG PO TABS: 10.0000 mg | ORAL_TABLET | Freq: Every day | ORAL | 1 refills | Status: DC

## 2020-05-23 NOTE — Telephone Encounter (Signed)
I called to speak to Sharon Norris over the phone this morning. She states that she left the emergency department last night before her workup could be completed because she was having difficulty with IV access. She states the nurse was "striking a nerve" and it was too painful to bear. She became anxious and ended up leaving the hospital prematurely.   I informed her that with her blood pressure in the 200's with elevated troponin >300 in the setting of chest pain and acute neurological deficits, I remain concerned she has had damage to her heart and/or brain (concern for MI/CVA/HTN emergency). I informed her that I would refill all of her medications as she has run out of all but her HIV and cholesterol medications. She endorses severe anxiety and requests something to help her get some sleep. I again reminded her of the risk of organ damage if she weren't able to go to the hospital for evaluation and encouraged her to go back to the ED and let them know I instructed her to return due to persistent concern. I will prescribe ramelteon with her other medications for now.   Jeralyn Bennett, MD 05/23/2020, 10:03 AM Pager: [phone]

## 2020-05-30 ENCOUNTER — Ambulatory Visit: Payer: Medicare Other | Admitting: Infectious Diseases

## 2020-06-13 ENCOUNTER — Other Ambulatory Visit: Payer: Self-pay

## 2020-06-13 ENCOUNTER — Ambulatory Visit (INDEPENDENT_AMBULATORY_CARE_PROVIDER_SITE_OTHER): Payer: Medicare Other | Admitting: Infectious Diseases

## 2020-06-13 ENCOUNTER — Encounter: Payer: Self-pay | Admitting: Infectious Diseases

## 2020-06-13 VITALS — BP 133/77 | HR 69 | Temp 98.0°F | Wt 192.0 lb

## 2020-06-13 DIAGNOSIS — Z113 Encounter for screening for infections with a predominantly sexual mode of transmission: Secondary | ICD-10-CM

## 2020-06-13 DIAGNOSIS — I10 Essential (primary) hypertension: Secondary | ICD-10-CM | POA: Diagnosis not present

## 2020-06-13 DIAGNOSIS — M25559 Pain in unspecified hip: Secondary | ICD-10-CM | POA: Diagnosis not present

## 2020-06-13 DIAGNOSIS — Z87898 Personal history of other specified conditions: Secondary | ICD-10-CM | POA: Diagnosis not present

## 2020-06-13 DIAGNOSIS — Z79899 Other long term (current) drug therapy: Secondary | ICD-10-CM

## 2020-06-13 DIAGNOSIS — I63432 Cerebral infarction due to embolism of left posterior cerebral artery: Secondary | ICD-10-CM | POA: Diagnosis not present

## 2020-06-13 DIAGNOSIS — N184 Chronic kidney disease, stage 4 (severe): Secondary | ICD-10-CM | POA: Diagnosis not present

## 2020-06-13 DIAGNOSIS — F1991 Other psychoactive substance use, unspecified, in remission: Secondary | ICD-10-CM

## 2020-06-13 DIAGNOSIS — B20 Human immunodeficiency virus [HIV] disease: Secondary | ICD-10-CM

## 2020-06-13 NOTE — Assessment & Plan Note (Signed)
She swears adherence Will check her labs today Concern over her fear of vaccines, fear of dentist.  Will see her back in 6 months.

## 2020-06-13 NOTE — Assessment & Plan Note (Signed)
"  clean for 21 years"
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
## 2020-06-13 NOTE — Assessment & Plan Note (Signed)
Will repeat her labs today

## 2020-06-13 NOTE — Assessment & Plan Note (Signed)
BP much better today Appreciate IMTS f/u

## 2020-06-13 NOTE — Assessment & Plan Note (Signed)
Taking baby asa, lipid lowering, bp control.  Will try to arrange neuro f/u.

## 2020-06-14 LAB — T-HELPER CELL (CD4) - (RCID CLINIC ONLY)
CD4 % Helper T Cell: 26 % — ABNORMAL LOW (ref 33–65)
CD4 T Cell Abs: 587 /uL (ref 400–1790)

## 2020-06-16 LAB — COMPREHENSIVE METABOLIC PANEL
AG Ratio: 1.1 (calc) (ref 1.0–2.5)
ALT: 7 U/L (ref 6–29)
AST: 10 U/L (ref 10–35)
Albumin: 3.9 g/dL (ref 3.6–5.1)
Alkaline phosphatase (APISO): 70 U/L (ref 37–153)
BUN/Creatinine Ratio: 21 (calc) (ref 6–22)
BUN: 30 mg/dL — ABNORMAL HIGH (ref 7–25)
CO2: 23 mmol/L (ref 20–32)
Calcium: 9.5 mg/dL (ref 8.6–10.4)
Chloride: 107 mmol/L (ref 98–110)
Creat: 1.46 mg/dL — ABNORMAL HIGH (ref 0.50–0.99)
Globulin: 3.5 g/dL (calc) (ref 1.9–3.7)
Glucose, Bld: 104 mg/dL — ABNORMAL HIGH (ref 65–99)
Potassium: 4.7 mmol/L (ref 3.5–5.3)
Sodium: 140 mmol/L (ref 135–146)
Total Bilirubin: 0.3 mg/dL (ref 0.2–1.2)
Total Protein: 7.4 g/dL (ref 6.1–8.1)

## 2020-06-16 LAB — CBC
HCT: 34.8 % — ABNORMAL LOW (ref 35.0–45.0)
Hemoglobin: 11.7 g/dL (ref 11.7–15.5)
MCH: 32.5 pg (ref 27.0–33.0)
MCHC: 33.6 g/dL (ref 32.0–36.0)
MCV: 96.7 fL (ref 80.0–100.0)
MPV: 12.3 fL (ref 7.5–12.5)
Platelets: 254 10*3/uL (ref 140–400)
RBC: 3.6 10*6/uL — ABNORMAL LOW (ref 3.80–5.10)
RDW: 12.5 % (ref 11.0–15.0)
WBC: 8.7 10*3/uL (ref 3.8–10.8)

## 2020-06-16 LAB — HIV-1 RNA QUANT-NO REFLEX-BLD
HIV 1 RNA Quant: 41 Copies/mL — ABNORMAL HIGH
HIV-1 RNA Quant, Log: 1.62 Log cps/mL — ABNORMAL HIGH

## 2020-06-16 LAB — RPR: RPR Ser Ql: NONREACTIVE

## 2020-06-16 LAB — HEPATITIS C RNA QUANTITATIVE
HCV Quantitative Log: <1.18 log IU/mL
HCV RNA, PCR, QN: <15 IU/mL

## 2020-06-16 LAB — LIPID PANEL
Cholesterol: 175 mg/dL (ref <200)
HDL: 43 mg/dL — ABNORMAL LOW (ref >=50)
LDL Cholesterol (Calc): 100 mg/dL (calc) — ABNORMAL HIGH
Non-HDL Cholesterol (Calc): 132 mg/dL (calc) — ABNORMAL HIGH (ref <130)
Total CHOL/HDL Ratio: 4.1 (calc) (ref <5.0)
Triglycerides: 203 mg/dL — ABNORMAL HIGH (ref <150)

## 2020-06-17 ENCOUNTER — Other Ambulatory Visit: Payer: Self-pay | Admitting: Infectious Diseases

## 2020-06-17 DIAGNOSIS — Z1231 Encounter for screening mammogram for malignant neoplasm of breast: Secondary | ICD-10-CM

## 2020-06-17 DIAGNOSIS — B20 Human immunodeficiency virus [HIV] disease: Secondary | ICD-10-CM

## 2020-06-21 ENCOUNTER — Other Ambulatory Visit: Payer: Self-pay

## 2020-06-21 ENCOUNTER — Emergency Department (HOSPITAL_COMMUNITY): Payer: Medicare Other

## 2020-06-21 ENCOUNTER — Emergency Department (HOSPITAL_COMMUNITY)
Admission: EM | Admit: 2020-06-21 | Discharge: 2020-06-21 | Disposition: A | Discharge status: Home / Self Care | Payer: Medicare Other | Attending: Emergency Medicine | Admitting: Emergency Medicine

## 2020-06-21 DIAGNOSIS — Z743 Need for continuous supervision: Secondary | ICD-10-CM | POA: Diagnosis not present

## 2020-06-21 DIAGNOSIS — R079 Chest pain, unspecified: Secondary | ICD-10-CM | POA: Diagnosis not present

## 2020-06-21 DIAGNOSIS — Z7982 Long term (current) use of aspirin: Secondary | ICD-10-CM | POA: Insufficient documentation

## 2020-06-21 DIAGNOSIS — R0789 Other chest pain: Secondary | ICD-10-CM | POA: Diagnosis not present

## 2020-06-21 DIAGNOSIS — N184 Chronic kidney disease, stage 4 (severe): Secondary | ICD-10-CM | POA: Diagnosis not present

## 2020-06-21 DIAGNOSIS — Z21 Asymptomatic human immunodeficiency virus [HIV] infection status: Secondary | ICD-10-CM | POA: Diagnosis not present

## 2020-06-21 DIAGNOSIS — I129 Hypertensive chronic kidney disease with stage 1 through stage 4 chronic kidney disease, or unspecified chronic kidney disease: Secondary | ICD-10-CM | POA: Insufficient documentation

## 2020-06-21 DIAGNOSIS — J9811 Atelectasis: Secondary | ICD-10-CM | POA: Diagnosis not present

## 2020-06-21 DIAGNOSIS — R109 Unspecified abdominal pain: Secondary | ICD-10-CM | POA: Diagnosis not present

## 2020-06-21 DIAGNOSIS — Z79899 Other long term (current) drug therapy: Secondary | ICD-10-CM | POA: Diagnosis not present

## 2020-06-21 DIAGNOSIS — I2510 Atherosclerotic heart disease of native coronary artery without angina pectoris: Secondary | ICD-10-CM | POA: Diagnosis not present

## 2020-06-21 DIAGNOSIS — R918 Other nonspecific abnormal finding of lung field: Secondary | ICD-10-CM | POA: Diagnosis not present

## 2020-06-21 DIAGNOSIS — I700 Atherosclerosis of aorta: Secondary | ICD-10-CM | POA: Diagnosis not present

## 2020-06-21 DIAGNOSIS — Z87891 Personal history of nicotine dependence: Secondary | ICD-10-CM | POA: Insufficient documentation

## 2020-06-21 DIAGNOSIS — R6889 Other general symptoms and signs: Secondary | ICD-10-CM | POA: Diagnosis not present

## 2020-06-21 LAB — CBC WITH DIFFERENTIAL/PLATELET
Abs Immature Granulocytes: 0.02 10*3/uL (ref 0.00–0.07)
Basophils Absolute: 0 10*3/uL (ref 0.0–0.1)
Basophils Relative: 1 %
Eosinophils Absolute: 0.1 10*3/uL (ref 0.0–0.5)
Eosinophils Relative: 1 %
HCT: 34.4 % — ABNORMAL LOW (ref 36.0–46.0)
Hemoglobin: 11.1 g/dL — ABNORMAL LOW (ref 12.0–15.0)
Immature Granulocytes: 0 %
Lymphocytes Relative: 25 %
Lymphs Abs: 1.8 10*3/uL (ref 0.7–4.0)
MCH: 32.6 pg (ref 26.0–34.0)
MCHC: 32.3 g/dL (ref 30.0–36.0)
MCV: 100.9 fL — ABNORMAL HIGH (ref 80.0–100.0)
Monocytes Absolute: 0.7 10*3/uL (ref 0.1–1.0)
Monocytes Relative: 9 %
Neutro Abs: 4.8 10*3/uL (ref 1.7–7.7)
Neutrophils Relative %: 64 %
Platelets: 196 10*3/uL (ref 150–400)
RBC: 3.41 MIL/uL — ABNORMAL LOW (ref 3.87–5.11)
RDW: 12.8 % (ref 11.5–15.5)
WBC: 7.4 10*3/uL (ref 4.0–10.5)
nRBC: 0 % (ref 0.0–0.2)

## 2020-06-21 LAB — BASIC METABOLIC PANEL
Anion gap: 5 (ref 5–15)
BUN: 34 mg/dL — ABNORMAL HIGH (ref 8–23)
CO2: 21 mmol/L — ABNORMAL LOW (ref 22–32)
Calcium: 8.7 mg/dL — ABNORMAL LOW (ref 8.9–10.3)
Chloride: 114 mmol/L — ABNORMAL HIGH (ref 98–111)
Creatinine, Ser: 1.66 mg/dL — ABNORMAL HIGH (ref 0.44–1.00)
GFR, Estimated: 34 mL/min — ABNORMAL LOW (ref >60)
Glucose, Bld: 114 mg/dL — ABNORMAL HIGH (ref 70–99)
Potassium: 4 mmol/L (ref 3.5–5.1)
Sodium: 140 mmol/L (ref 135–145)

## 2020-06-21 LAB — TROPONIN I (HIGH SENSITIVITY)
Troponin I (High Sensitivity): 24 ng/L — ABNORMAL HIGH (ref <18)
Troponin I (High Sensitivity): 26 ng/L — ABNORMAL HIGH (ref <18)

## 2020-06-21 LAB — ETHANOL: Alcohol, Ethyl (B): <10 mg/dL (ref <10)

## 2020-06-21 MED ORDER — IOHEXOL 350 MG/ML SOLN
75.0000 mL | Freq: Once | INTRAVENOUS | Status: AC | PRN
  Administered 2020-06-21: 75 mL via INTRAVENOUS

## 2020-06-21 MED ORDER — FENTANYL CITRATE (PF) 100 MCG/2ML IJ SOLN
50.0000 ug | Freq: Once | INTRAMUSCULAR | Status: AC
  Administered 2020-06-21: 50 ug via INTRAVENOUS
  Filled 2020-06-21: qty 2

## 2020-06-21 NOTE — Discharge Instructions (Signed)
Please follow-up with your primary doctor.  If you you change your mind, you have any return of your chest pain or difficulty breathing, come back to ER for reassessment.  I would recommend following up with a cardiologist to discuss her symptoms.

## 2020-06-21 NOTE — ED Provider Notes (Signed)
[place] EMERGENCY DEPARTMENT Provider Note   CSN: [id number] Arrival date & time: 06/21/20  0710     History Chief Complaint  Patient presents with  . Chest Pain    Sharon Norris is a 65 y.o. female.  Presented to ER with concern for chest pain.  Patient reports that she is having severe chest pain, radiates to back and down her left arm.  Has been ongoing for the last 2 days.  Relatively constant, described as pressure.  Slight relief with nitro given by EMS.  No associated difficulty in breathing.  No fevers or chills or cough.  Last alcohol and cocaine use was a little over a week ago.  Prior admission for NSTEMI, CVA, withdrawal.  HPI     Past Medical History:  Diagnosis Date  . Anxiety   . Bronchitis   . Depression   . Excessive cerumen in both ear canals 07/02/2017  . H/O drug abuse ([place])    IV drugs, drug free 12 years  . Hepatitis    Hep. C  . History of suicidal ideation    thought about overdose in the past  . HIV infection ([place])   . Hypertension   . Neuropathy   . Sleep difficulties   . Trichomonal infection    2009    Patient Active Problem List   Diagnosis Date Noted  . Hip pain 06/13/2020  . Hypertensive emergency 05/22/2020  . Cerebral embolism with cerebral infarction 11/27/2019  . NSTEMI (non-ST elevated myocardial infarction) ([place]) 11/21/2019  . SIRS (systemic inflammatory response syndrome) ([place]) 11/21/2019  . Numbness on left side 11/21/2019  . Prolonged QT interval 11/21/2019  . Hyperglycemia 11/21/2019  . Pneumonia of left lower lobe due to infectious organism   . Atypical chest pain/L. Shoulder pain 04/10/2019  . CKD (chronic kidney disease), stage IV ([place]) 08/25/2018  . Constipated 06/28/2018  . Fall 07/13/2017  . Poor dentition 04/08/2016  . Health care maintenance 09/05/2013  . History of drug use 06/11/2011  . Psychiatric disorder 09/17/2010  . Peripheral neuropathy ([place]) 04/02/2009  . Insomnia 01/15/2009  .  Chronic back pain 12/12/2007  . Human immunodeficiency virus (HIV) disease ([place]) 12/23/2005  . History of Chronic hepatitis C without hepatic coma ([place]) 12/23/2005  . Essential hypertension 12/23/2005    Past Surgical History:  Procedure Laterality Date  . COLPOSCOPY  2010     OB History    Gravida  3   Para  2   Term  2   Preterm      AB  1   Living        SAB      IAB  1   Ectopic      Multiple      Live Births              Family History  Problem Relation Age of Onset  . Alcohol abuse Father   . Cancer Maternal Aunt        endometrial  . Cancer Sister        endometrial  . Cancer Sister        breast    Social History   Tobacco Use  . Smoking status: Former Smoker    Quit date: 02/17/2000    Years since quitting: 20.3  . Smokeless tobacco: Never Used  Vaping Use  . Vaping Use: Never used  Substance Use Topics  . Alcohol use: Yes    Alcohol/week: 0.0 standard drinks  Comment: beer, occasional  . Drug use: No    Home Medications Prior to Admission medications   Medication Sig Start Date End Date Taking? Authorizing Provider  abacavir-dolutegravir-lamiVUDine (TRIUMEQ) 600-50-300 MG tablet Take 1 tablet by mouth daily. 05/23/20   Jeralyn Bennett, MD  amLODipine (NORVASC) 10 MG tablet Take 1 tablet (10 mg total) by mouth daily. 05/23/20   Jeralyn Bennett, MD  aspirin EC 81 MG tablet Take 1 tablet (81 mg total) by mouth daily. Swallow whole. 05/23/20   Jeralyn Bennett, MD  atorvastatin (LIPITOR) 80 MG tablet Take 1 tablet (80 mg total) by mouth daily. 05/23/20   Jeralyn Bennett, MD  carvedilol (COREG) 25 MG tablet Take 1 tablet (25 mg total) by mouth 2 (two) times daily with a meal. 05/23/20   Jeralyn Bennett, MD  divalproex (DEPAKOTE ER) 500 MG 24 hr tablet Take 1 tablet (500 mg total) by mouth daily. 05/23/20   Jeralyn Bennett, MD  gabapentin (NEURONTIN) 300 MG capsule Take 2 capsules (600 mg total) by mouth 2 (two) times daily. 05/23/20   Jeralyn Bennett, MD  isosorbide mononitrate (IMDUR) 60 MG 24 hr tablet Take 1 tablet (60 mg total) by mouth daily. 05/23/20   Jeralyn Bennett, MD  Multiple Vitamin (MULTIVITAMIN WITH MINERALS) TABS tablet Take 1 tablet by mouth daily.    [provider]  nicotine (NICODERM CQ - DOSED IN MG/24 HOURS) 14 mg/24hr patch Place 1 patch (14 mg total) onto the skin daily. 11/30/19   Mercy Riding, MD  ramelteon (ROZEREM) 8 MG tablet Take 1 tablet (8 mg total) by mouth at bedtime as needed for sleep. 05/23/20 05/23/21  Jeralyn Bennett, MD    Allergies    Benadryl [diphenhydramine hcl], Ribavirin, and Viekira pak [ombitasvir-paritaprevir-ritonavir-dasabuvir]  Review of Systems   Review of Systems  Constitutional: Negative for chills and fever.  HENT: Negative for ear pain and sore throat.   Eyes: Negative for pain and visual disturbance.  Respiratory: Negative for cough and shortness of breath.   Cardiovascular: Positive for chest pain. Negative for palpitations.  Gastrointestinal: Negative for abdominal pain and vomiting.  Genitourinary: Negative for dysuria and hematuria.  Musculoskeletal: Positive for arthralgias and back pain.  Skin: Negative for color change and rash.  Neurological: Negative for seizures and syncope.  All other systems reviewed and are negative.   Physical Exam Updated Vital Signs BP (!) 180/75   Pulse (!) 59   Temp 98.4 F (36.9 C) (Oral)   Resp 11   SpO2 100%   Physical Exam Vitals and nursing note reviewed.  Constitutional:      General: She is not in acute distress.    Appearance: She is well-developed.  HENT:     Head: Normocephalic and atraumatic.  Eyes:     Conjunctiva/sclera: Conjunctivae normal.  Cardiovascular:     Rate and Rhythm: Normal rate and regular rhythm.     Heart sounds: No murmur heard.   Pulmonary:     Effort: Pulmonary effort is normal. No respiratory distress.     Breath sounds: Normal breath sounds.  Chest:     Comments: Some  tenderness to palpation over the anterior chest wall, no crepitus Abdominal:     Palpations: Abdomen is soft.     Tenderness: There is no abdominal tenderness.  Musculoskeletal:     Cervical back: Neck supple.  Skin:    General: Skin is warm and dry.  Neurological:     Mental Status: She is alert.  ED Results / Procedures / Treatments   Labs (all labs ordered are listed, but only abnormal results are displayed) Labs Reviewed  CBC WITH DIFFERENTIAL/PLATELET - Abnormal; Notable for the following components:      Result Value   RBC 3.41 (*)    Hemoglobin 11.1 (*)    HCT 34.4 (*)    MCV 100.9 (*)    All other components within normal limits  BASIC METABOLIC PANEL - Abnormal; Notable for the following components:   Chloride 114 (*)    CO2 21 (*)    Glucose, Bld 114 (*)    BUN 34 (*)    Creatinine, Ser 1.66 (*)    Calcium 8.7 (*)    GFR, Estimated 34 (*)    All other components within normal limits  TROPONIN I (HIGH SENSITIVITY) - Abnormal; Notable for the following components:   Troponin I (High Sensitivity) 24 (*)    All other components within normal limits  ETHANOL  RAPID URINE DRUG SCREEN, HOSP PERFORMED  URINALYSIS, ROUTINE W REFLEX MICROSCOPIC  TROPONIN I (HIGH SENSITIVITY)    EKG EKG Interpretation  Date/Time:  Friday Jun 21 2020 07:15:34 EDT Ventricular Rate:  59 PR Interval:  201 QRS Duration: 87 QT Interval:  445 QTC Calculation: 441 R Axis:   50 Text Interpretation: Sinus rhythm Probable LVH with secondary repol abnrm Confirmed by Madalyn Rob [id number]) on 06/21/2020 7:36:41 AM   Radiology DG Chest Portable 1 View  Result Date: 06/21/2020 CLINICAL DATA:  Chest pain. EXAM: PORTABLE CHEST 1 VIEW COMPARISON:  Prior chest radiographs 05/22/2020 and earlier. FINDINGS: Shallow inspiration radiograph. Heart size within normal limits. Mild ill-defined opacity within the medial right lung base. The left lung is clear. No evidence of pleural effusion or  pneumothorax. No acute bony abnormality identified. IMPRESSION: Shallow inspiration radiograph. Mild ill-defined opacity within the medial right lung base, favored to reflect atelectasis. An early pneumonia is difficult to definitively exclude. Electronically Signed   By: Kellie Simmering DO   On: 06/21/2020 08:26    Procedures Procedures   Medications Ordered in ED Medications  fentaNYL (SUBLIMAZE) injection 50 mcg (50 mcg Intravenous Given 06/21/20 [id number])    ED Course  I have reviewed the triage vital signs and the nursing notes.  Pertinent labs & imaging results that were available during my care of the patient were reviewed by me and considered in my medical decision making (see chart for details).    MDM Rules/Calculators/A&P                         66 year old lady presents to ER with concern for chest pain radiating to back and left shoulder.  Extensive past medical history including polysubstance abuse, HIV.  Prior admission for NSTEMI, CVA.  Based upon her initial history, obtain broad work-up.  EKG without acute ischemic change, troponin was borderline elevated but repeat troponin stable.  CTA chest abdomen pelvis negative for dissection or other acute process.  Based on her initial presentation, mild elevation of troponin, I recommend patient that she remain in ER until case is discussed further with cardiology, possibly admit for observation.  Patient states that she is unwilling to stay in ER and wishes to be discharged home. She reports complete resolution of symptoms.  I discussed the risks and benefits of admission versus outpatient management, patient able to demonstrate good understanding and ultimately left against medical advice.  Instructed to try to get close follow-up with her primary  care doctor.   Final Clinical Impression(s) / ED Diagnoses Final diagnoses:  Chest pain, unspecified type    Rx / DC Orders ED Discharge Orders    None       Lucrezia Starch,  MD 06/21/20 2108

## 2020-06-21 NOTE — ED Triage Notes (Signed)
Pt reports chest pain x 2 days, worse today. 324 ASA and one nitro given by EMS. Pt initially anxious but pain improving in route as pt became more calm.

## 2020-06-21 NOTE — ED Notes (Signed)
Patient transported to CT 

## 2020-06-25 ENCOUNTER — Other Ambulatory Visit: Payer: Self-pay | Admitting: Infectious Diseases

## 2020-06-25 ENCOUNTER — Telehealth: Payer: Self-pay | Admitting: Infectious Diseases

## 2020-06-25 DIAGNOSIS — Z1231 Encounter for screening mammogram for malignant neoplasm of breast: Secondary | ICD-10-CM

## 2020-06-25 NOTE — Telephone Encounter (Signed)
Called and left VM to check on pt's hip after fall.  Asked her to call back if not improved. If improved, no need to call back.

## 2020-06-26 NOTE — Telephone Encounter (Signed)
Patient returning call, she states her whole left side is still hurting and she has not had any improvement. Will route to provider.   Beryle Flock, RN

## 2020-06-27 ENCOUNTER — Other Ambulatory Visit: Payer: Self-pay | Admitting: Infectious Diseases

## 2020-06-27 DIAGNOSIS — B20 Human immunodeficiency virus [HIV] disease: Secondary | ICD-10-CM

## 2020-07-02 ENCOUNTER — Encounter: Payer: Medicare Other | Admitting: Student

## 2020-07-02 ENCOUNTER — Other Ambulatory Visit: Payer: Self-pay | Admitting: Student

## 2020-07-10 ENCOUNTER — Encounter: Payer: Self-pay | Admitting: *Deleted

## 2020-07-10 ENCOUNTER — Other Ambulatory Visit: Payer: Medicare Other

## 2020-07-21 DIAGNOSIS — F1990 Other psychoactive substance use, unspecified, uncomplicated: Secondary | ICD-10-CM | POA: Insufficient documentation

## 2020-07-21 NOTE — Assessment & Plan Note (Deleted)
Symptoms ** controlled on gabapentin '600mg'$  twice daily. Based on her labs from 06/21/20, creatinine clearance is around 47. Maximum gabapentin dosing for this is '900mg'$ /day. Will need to continue to monitor for toxicity.

## 2020-07-21 NOTE — Assessment & Plan Note (Deleted)
Cocaine positive on prior UA's collected in the setting of hospital presentations for chest pain and CVA.  -substance abuse resources provided -continue to encourage cessation

## 2020-07-21 NOTE — Assessment & Plan Note (Deleted)
HYPERTENSION FOLLOW-UP: Current medications: amlodipine '10mg'$ , coreg '25mg'$  BID, imdur '60mg'$  daily Med Adherence: '[]'$  Yes    '[]'$  No Medication side effects: '[]'$  Yes    '[]'$  No Adherence with salt restriction: '[]'$  Yes    '[]'$  No  Assessment: Blood pressure is ** goal in the office today. Renal function and electrolytes appeared stable on 5/6 labs so will defer repeat today. BP Readings from Last 3 Encounters:  06/21/20 (!) 172/69  06/13/20 133/77  05/22/20 (!) 200/182     Plan -BMP -continue current management -follow up 3 mo

## 2020-07-21 NOTE — Assessment & Plan Note (Deleted)
Currently on depakote '500mg'$  daily.  No recent levels checked. Given her complex psychiatric history, recommend referral to psychiatry for ongoing management

## 2020-07-21 NOTE — Progress Notes (Deleted)
Office Visit   Patient ID: Sharon Norris, female    DOB: [birth year], 65 y.o.   MRN: [id number]   PCP: [name], MD   Subjective:  CC: hospital follow up, chronic hypertension, CKD  Sharon Norris is a 65 y.o. year old female who presents for hospital follow up and follow up of her chronic medical conditions.   Hospital follow up She presented to [facility] ED 06/21/20 for evaluation of chest pain that radiated to her back and left shoulder. Workup, including EKG, CTA c/a/p were unremarkable for acute changes. Troponins were elevated but stable. It was recommended that she await further evaluation by cardiology however ultimately left AMA.  Of note, she was seen in our clinic on 05/22/20 by Dr. Konrad Penta for chest pain and was subsequently transferred to the ED. Unfortunately, she left AMA, prior to completing an evaluation.       ACTIVE MEDICATIONS   Outpatient Medications Prior to Visit  Medication Sig Dispense Refill  . abacavir-dolutegravir-lamiVUDine (TRIUMEQ) 600-50-300 MG tablet Take 1 tablet by mouth daily. 30 tablet 4  . amLODipine (NORVASC) 10 MG tablet Take 1 tablet (10 mg total) by mouth daily. 90 tablet 1  . aspirin EC 81 MG tablet Take 1 tablet (81 mg total) by mouth daily. Swallow whole. 90 tablet 1  . atorvastatin (LIPITOR) 80 MG tablet Take 1 tablet (80 mg total) by mouth daily. 90 tablet 1  . carvedilol (COREG) 25 MG tablet Take 1 tablet (25 mg total) by mouth 2 (two) times daily with a meal. 180 tablet 1  . divalproex (DEPAKOTE ER) 500 MG 24 hr tablet Take 1 tablet (500 mg total) by mouth daily. 90 tablet 1  . gabapentin (NEURONTIN) 300 MG capsule Take 2 capsules (600 mg total) by mouth 2 (two) times daily. 180 capsule 1  . isosorbide mononitrate (IMDUR) 60 MG 24 hr tablet Take 1 tablet (60 mg total) by mouth daily. 90 tablet 1  . Multiple Vitamin (MULTIVITAMIN WITH MINERALS) TABS tablet Take 1 tablet by mouth daily.    . nicotine (NICODERM CQ - DOSED IN MG/24 HOURS)  14 mg/24hr patch Place 1 patch (14 mg total) onto the skin daily. 28 patch 0  . ramelteon (ROZEREM) 8 MG tablet TAKE ONE TABLET BY MOUTH AT BEDTIME AS NEEDED FOR SLEEP 30 tablet 0   No facility-administered medications prior to visit.     Objective:   There were no vitals taken for this visit. Wt Readings from Last 3 Encounters:  06/13/20 192 lb (87.1 kg)  05/22/20 190 lb 3.2 oz (86.3 kg)  11/30/19 201 lb 4.5 oz (91.3 kg)   BP Readings from Last 3 Encounters:  06/21/20 (!) 172/69  06/13/20 133/77  05/22/20 (!) 200/182   Physical Exam  Health Maintenance:   Health Maintenance  Topic Date Due  . Pneumococcal Vaccine 56-62 Years old (1 of 4 - PCV13) Never done  . COVID-19 Vaccine (1) Never done  . TETANUS/TDAP  Never done  . COLONOSCOPY (Pts 45-52yr Insurance coverage will need to be confirmed)  Never done  . Zoster Vaccines- Shingrix (1 of 2) Never done  . MAMMOGRAM  06/10/2013  . DEXA SCAN  Never done  . INFLUENZA VACCINE  09/16/2020  . Hepatitis C Screening  Completed  . PNA vac Low Risk Adult  Completed  . HPV VACCINES  Aged Out     Assessment & Plan:   Problem List Items Addressed This Visit      Cardiovascular  and Mediastinum   Essential hypertension - Primary (Chronic)       No follow-ups on file.   Pt discussed with ***  Mitzi Hansen, MD Internal Medicine Resident PGY-2 [facility] Pager: [phone] 07/21/2020 8:10 PM

## 2020-07-22 ENCOUNTER — Encounter: Payer: Medicare Other | Admitting: Internal Medicine

## 2020-07-22 ENCOUNTER — Telehealth: Payer: Self-pay

## 2020-07-22 DIAGNOSIS — G620 Drug-induced polyneuropathy: Secondary | ICD-10-CM

## 2020-07-22 DIAGNOSIS — I10 Essential (primary) hypertension: Secondary | ICD-10-CM

## 2020-07-22 DIAGNOSIS — F1990 Other psychoactive substance use, unspecified, uncomplicated: Secondary | ICD-10-CM

## 2020-07-22 DIAGNOSIS — F99 Mental disorder, not otherwise specified: Secondary | ICD-10-CM

## 2020-07-22 NOTE — Telephone Encounter (Signed)
Pt was called about missed appt today 07/22/20 @ 2:15  A voice message was left

## 2020-08-20 ENCOUNTER — Encounter: Payer: Self-pay | Admitting: *Deleted

## 2020-08-20 ENCOUNTER — Other Ambulatory Visit: Payer: Medicare Other

## 2020-10-03 ENCOUNTER — Other Ambulatory Visit: Payer: Self-pay | Admitting: Student

## 2020-10-03 ENCOUNTER — Other Ambulatory Visit: Payer: Self-pay | Admitting: Infectious Diseases

## 2020-10-03 ENCOUNTER — Telehealth: Payer: Self-pay

## 2020-10-03 DIAGNOSIS — B20 Human immunodeficiency virus [HIV] disease: Secondary | ICD-10-CM

## 2020-10-03 DIAGNOSIS — G620 Drug-induced polyneuropathy: Secondary | ICD-10-CM

## 2020-10-03 NOTE — Telephone Encounter (Signed)
Patient states she is scheduled for MRI on 8/31, she is requesting medication prior to her imaging due to her claustrophobia. Routing message to provider to make aware for advise.   Sharon Norris

## 2020-10-03 NOTE — Progress Notes (Signed)
Patient ID: Sharon Norris, female   DOB: 1955/10/08, 65 y.o.   MRN: [id number] Called patient to reschedule MRI Hip   left message to call  patient has been scheduled twice  on 07-10-20 and canceled  On 08-20-20 and canceled

## 2020-10-04 NOTE — Telephone Encounter (Signed)
Rx ativan 0.5 mg tablet. Take 1 tablet by  mouth 30 minutes prior to MRI faxed to pharmacy. Patient made aware and will contact pharmacy

## 2020-10-16 ENCOUNTER — Other Ambulatory Visit: Payer: Medicare Other

## 2020-10-25 NOTE — Progress Notes (Signed)
Patient ID: Sharon Norris, female   DOB: January 28, 1956, 65 y.o.   MRN: [id number] Patient canceled her mri on 10-16-20  for the third time she will need to call [facility] to reschedule at [phone]

## 2020-10-29 ENCOUNTER — Telehealth: Payer: Self-pay

## 2020-10-29 NOTE — Telephone Encounter (Signed)
Patient called and advised of the need for an AWV, she verbalized understanding. Appointment scheduled for Thursday 10/31/20 at 1800.

## 2020-10-31 ENCOUNTER — Ambulatory Visit: Payer: Medicare Other

## 2020-11-06 ENCOUNTER — Encounter: Payer: Medicare Other | Admitting: Internal Medicine

## 2020-11-07 ENCOUNTER — Other Ambulatory Visit: Payer: Self-pay | Admitting: Student

## 2020-11-07 DIAGNOSIS — F99 Mental disorder, not otherwise specified: Secondary | ICD-10-CM

## 2020-11-08 NOTE — Assessment & Plan Note (Signed)
I received a med refill request for depakote today. It's unclear to me what the indication is for this but it appears to be associated with "psychiatric disorder" in the chart. Last note regarding this is from 2019 but it appears to be refilled regularly.  No prior psychiatric evaluation is present for me to see in the chart. Depakote needs regular monitoring of renal and liver function in addition to levels drawn.  No further refills for this should be provided through the [facility]. She will need to follow up with psychiatry for ongoing care.  She also needs to be seen in our office--LOV 05/2020 with BP 211/69 and chest pain>>sent to the ED. No further follow up in our clinic since then.  30d supply of all medications sent to the pharmacy. No further refills until seen in the office.

## 2020-11-08 NOTE — Telephone Encounter (Signed)
Pt needs follow up in our office prior to future refills. 30d supply of medications provided. Please arrange follow up in the clinic.

## 2020-11-11 NOTE — Telephone Encounter (Signed)
If we are unable to contact her, can we send a letter to schedule a visit?

## 2020-11-11 NOTE — Telephone Encounter (Signed)
Attempted to contact patient via telephone, no answer.  Left detailed message asking to please contact the clinic to get an appointment scheduled in the next 30 days.

## 2020-11-14 ENCOUNTER — Ambulatory Visit: Payer: Medicare Other | Admitting: Infectious Diseases

## 2020-11-20 ENCOUNTER — Encounter: Payer: Self-pay | Admitting: *Deleted

## 2020-11-20 NOTE — Progress Notes (Unsigned)

## 2020-11-23 ENCOUNTER — Emergency Department (HOSPITAL_COMMUNITY): Payer: Medicare Other

## 2020-11-23 ENCOUNTER — Encounter (HOSPITAL_COMMUNITY): Payer: Self-pay | Admitting: Emergency Medicine

## 2020-11-23 DIAGNOSIS — I129 Hypertensive chronic kidney disease with stage 1 through stage 4 chronic kidney disease, or unspecified chronic kidney disease: Secondary | ICD-10-CM | POA: Diagnosis not present

## 2020-11-23 DIAGNOSIS — I214 Non-ST elevation (NSTEMI) myocardial infarction: Principal | ICD-10-CM | POA: Diagnosis present

## 2020-11-23 DIAGNOSIS — G9340 Encephalopathy, unspecified: Secondary | ICD-10-CM

## 2020-11-23 DIAGNOSIS — B1920 Unspecified viral hepatitis C without hepatic coma: Secondary | ICD-10-CM | POA: Diagnosis present

## 2020-11-23 DIAGNOSIS — I422 Other hypertrophic cardiomyopathy: Secondary | ICD-10-CM | POA: Diagnosis present

## 2020-11-23 DIAGNOSIS — J90 Pleural effusion, not elsewhere classified: Secondary | ICD-10-CM | POA: Diagnosis not present

## 2020-11-23 DIAGNOSIS — Z7982 Long term (current) use of aspirin: Secondary | ICD-10-CM

## 2020-11-23 DIAGNOSIS — Z66 Do not resuscitate: Secondary | ICD-10-CM | POA: Diagnosis present

## 2020-11-23 DIAGNOSIS — Z4682 Encounter for fitting and adjustment of non-vascular catheter: Secondary | ICD-10-CM | POA: Diagnosis not present

## 2020-11-23 DIAGNOSIS — Z87891 Personal history of nicotine dependence: Secondary | ICD-10-CM

## 2020-11-23 DIAGNOSIS — J9690 Respiratory failure, unspecified, unspecified whether with hypoxia or hypercapnia: Secondary | ICD-10-CM

## 2020-11-23 DIAGNOSIS — G931 Anoxic brain damage, not elsewhere classified: Secondary | ICD-10-CM | POA: Diagnosis not present

## 2020-11-23 DIAGNOSIS — F32A Depression, unspecified: Secondary | ICD-10-CM | POA: Diagnosis present

## 2020-11-23 DIAGNOSIS — J9602 Acute respiratory failure with hypercapnia: Secondary | ICD-10-CM | POA: Diagnosis not present

## 2020-11-23 DIAGNOSIS — B20 Human immunodeficiency virus [HIV] disease: Secondary | ICD-10-CM | POA: Diagnosis present

## 2020-11-23 DIAGNOSIS — I469 Cardiac arrest, cause unspecified: Secondary | ICD-10-CM | POA: Diagnosis present

## 2020-11-23 DIAGNOSIS — Z811 Family history of alcohol abuse and dependence: Secondary | ICD-10-CM | POA: Diagnosis not present

## 2020-11-23 DIAGNOSIS — J811 Chronic pulmonary edema: Secondary | ICD-10-CM | POA: Diagnosis not present

## 2020-11-23 DIAGNOSIS — R402431 Glasgow coma scale score 3-8, in the field [EMT or ambulance]: Secondary | ICD-10-CM

## 2020-11-23 DIAGNOSIS — F1410 Cocaine abuse, uncomplicated: Secondary | ICD-10-CM | POA: Diagnosis not present

## 2020-11-23 DIAGNOSIS — E875 Hyperkalemia: Secondary | ICD-10-CM | POA: Diagnosis not present

## 2020-11-23 DIAGNOSIS — F419 Anxiety disorder, unspecified: Secondary | ICD-10-CM | POA: Diagnosis present

## 2020-11-23 DIAGNOSIS — N179 Acute kidney failure, unspecified: Secondary | ICD-10-CM | POA: Diagnosis not present

## 2020-11-23 DIAGNOSIS — R0600 Dyspnea, unspecified: Secondary | ICD-10-CM | POA: Diagnosis not present

## 2020-11-23 DIAGNOSIS — J9601 Acute respiratory failure with hypoxia: Secondary | ICD-10-CM | POA: Diagnosis not present

## 2020-11-23 DIAGNOSIS — J189 Pneumonia, unspecified organism: Secondary | ICD-10-CM | POA: Diagnosis present

## 2020-11-23 DIAGNOSIS — Z809 Family history of malignant neoplasm, unspecified: Secondary | ICD-10-CM | POA: Diagnosis not present

## 2020-11-23 DIAGNOSIS — G9341 Metabolic encephalopathy: Secondary | ICD-10-CM | POA: Diagnosis present

## 2020-11-23 DIAGNOSIS — Z20822 Contact with and (suspected) exposure to covid-19: Secondary | ICD-10-CM | POA: Diagnosis not present

## 2020-11-23 DIAGNOSIS — J984 Other disorders of lung: Secondary | ICD-10-CM | POA: Diagnosis not present

## 2020-11-23 DIAGNOSIS — E872 Acidosis, unspecified: Secondary | ICD-10-CM | POA: Diagnosis not present

## 2020-11-23 DIAGNOSIS — N1832 Chronic kidney disease, stage 3b: Secondary | ICD-10-CM | POA: Diagnosis present

## 2020-11-23 DIAGNOSIS — J9811 Atelectasis: Secondary | ICD-10-CM | POA: Diagnosis not present

## 2020-11-23 DIAGNOSIS — I2510 Atherosclerotic heart disease of native coronary artery without angina pectoris: Secondary | ICD-10-CM | POA: Diagnosis not present

## 2020-11-23 DIAGNOSIS — F1990 Other psychoactive substance use, unspecified, uncomplicated: Secondary | ICD-10-CM | POA: Diagnosis present

## 2020-11-23 DIAGNOSIS — G629 Polyneuropathy, unspecified: Secondary | ICD-10-CM | POA: Diagnosis present

## 2020-11-23 DIAGNOSIS — Z4659 Encounter for fitting and adjustment of other gastrointestinal appliance and device: Secondary | ICD-10-CM

## 2020-11-23 DIAGNOSIS — Z515 Encounter for palliative care: Secondary | ICD-10-CM

## 2020-11-23 DIAGNOSIS — R404 Transient alteration of awareness: Secondary | ICD-10-CM | POA: Diagnosis not present

## 2020-11-23 DIAGNOSIS — Z743 Need for continuous supervision: Secondary | ICD-10-CM | POA: Diagnosis not present

## 2020-11-23 DIAGNOSIS — I499 Cardiac arrhythmia, unspecified: Secondary | ICD-10-CM | POA: Diagnosis not present

## 2020-11-23 DIAGNOSIS — R569 Unspecified convulsions: Secondary | ICD-10-CM | POA: Diagnosis not present

## 2020-11-23 DIAGNOSIS — Z87898 Personal history of other specified conditions: Secondary | ICD-10-CM

## 2020-11-23 DIAGNOSIS — R0989 Other specified symptoms and signs involving the circulatory and respiratory systems: Secondary | ICD-10-CM | POA: Diagnosis not present

## 2020-11-23 DIAGNOSIS — Z8673 Personal history of transient ischemic attack (TIA), and cerebral infarction without residual deficits: Secondary | ICD-10-CM

## 2020-11-23 DIAGNOSIS — M7989 Other specified soft tissue disorders: Secondary | ICD-10-CM | POA: Diagnosis not present

## 2020-11-23 LAB — RESP PANEL BY RT-PCR (FLU A&B, COVID) ARPGX2
Influenza A by PCR: NEGATIVE
Influenza B by PCR: NEGATIVE
SARS Coronavirus 2 by RT PCR: NEGATIVE

## 2020-11-23 LAB — COMPREHENSIVE METABOLIC PANEL
ALT: 19 U/L (ref 0–44)
AST: 43 U/L — ABNORMAL HIGH (ref 15–41)
Albumin: 2.6 g/dL — ABNORMAL LOW (ref 3.5–5.0)
Alkaline Phosphatase: 77 U/L (ref 38–126)
Anion gap: 8 (ref 5–15)
BUN: 25 mg/dL — ABNORMAL HIGH (ref 8–23)
CO2: 18 mmol/L — ABNORMAL LOW (ref 22–32)
Calcium: 7.5 mg/dL — ABNORMAL LOW (ref 8.9–10.3)
Chloride: 112 mmol/L — ABNORMAL HIGH (ref 98–111)
Creatinine, Ser: 1.71 mg/dL — ABNORMAL HIGH (ref 0.44–1.00)
GFR, Estimated: 32 mL/min — ABNORMAL LOW (ref >60)
Glucose, Bld: 255 mg/dL — ABNORMAL HIGH (ref 70–99)
Potassium: 5.5 mmol/L — ABNORMAL HIGH (ref 3.5–5.1)
Sodium: 138 mmol/L (ref 135–145)
Total Bilirubin: 0.7 mg/dL (ref 0.3–1.2)
Total Protein: 6.6 g/dL (ref 6.5–8.1)

## 2020-11-23 LAB — CBC WITH DIFFERENTIAL/PLATELET
Abs Immature Granulocytes: 0.24 10*3/uL — ABNORMAL HIGH (ref 0.00–0.07)
Basophils Absolute: 0.1 10*3/uL (ref 0.0–0.1)
Basophils Relative: 0 %
Eosinophils Absolute: 0 10*3/uL (ref 0.0–0.5)
Eosinophils Relative: 0 %
HCT: 39.4 % (ref 36.0–46.0)
Hemoglobin: 12.5 g/dL (ref 12.0–15.0)
Immature Granulocytes: 1 %
Lymphocytes Relative: 9 %
Lymphs Abs: 1.6 10*3/uL (ref 0.7–4.0)
MCH: 32.3 pg (ref 26.0–34.0)
MCHC: 31.7 g/dL (ref 30.0–36.0)
MCV: 101.8 fL — ABNORMAL HIGH (ref 80.0–100.0)
Monocytes Absolute: 0.6 10*3/uL (ref 0.1–1.0)
Monocytes Relative: 3 %
Neutro Abs: 16 10*3/uL — ABNORMAL HIGH (ref 1.7–7.7)
Neutrophils Relative %: 87 %
Platelets: 236 10*3/uL (ref 150–400)
RBC: 3.87 MIL/uL (ref 3.87–5.11)
RDW: 13.2 % (ref 11.5–15.5)
WBC: 18.6 10*3/uL — ABNORMAL HIGH (ref 4.0–10.5)
nRBC: 0 % (ref 0.0–0.2)

## 2020-11-23 LAB — I-STAT ARTERIAL BLOOD GAS, ED
Acid-base deficit: 10 mmol/L — ABNORMAL HIGH (ref 0.0–2.0)
Bicarbonate: 19.2 mmol/L — ABNORMAL LOW (ref 20.0–28.0)
Calcium, Ion: 1.16 mmol/L (ref 1.15–1.40)
HCT: 37 % (ref 36.0–46.0)
Hemoglobin: 12.6 g/dL (ref 12.0–15.0)
O2 Saturation: 100 %
Patient temperature: 98.6
Potassium: 4.9 mmol/L (ref 3.5–5.1)
Sodium: 141 mmol/L (ref 135–145)
TCO2: 21 mmol/L — ABNORMAL LOW (ref 22–32)
pCO2 arterial: 54.5 mmHg — ABNORMAL HIGH (ref 32.0–48.0)
pH, Arterial: 7.155 — CL (ref 7.350–7.450)
pO2, Arterial: 228 mmHg — ABNORMAL HIGH (ref 83.0–108.0)

## 2020-11-23 LAB — URINALYSIS, ROUTINE W REFLEX MICROSCOPIC
Bilirubin Urine: NEGATIVE
Glucose, UA: 50 mg/dL — AB
Ketones, ur: NEGATIVE mg/dL
Leukocytes,Ua: NEGATIVE
Nitrite: NEGATIVE
Protein, ur: 100 mg/dL — AB
Specific Gravity, Urine: 1.009 (ref 1.005–1.030)
pH: 6 (ref 5.0–8.0)

## 2020-11-23 LAB — LACTIC ACID, PLASMA: Lactic Acid, Venous: 3.4 mmol/L (ref 0.5–1.9)

## 2020-11-23 LAB — HEMOGLOBIN A1C
Hgb A1c MFr Bld: 6.4 % — ABNORMAL HIGH (ref 4.8–5.6)
Mean Plasma Glucose: 136.98 mg/dL

## 2020-11-23 LAB — RAPID URINE DRUG SCREEN, HOSP PERFORMED
Amphetamines: NOT DETECTED
Barbiturates: NOT DETECTED
Benzodiazepines: POSITIVE — AB
Cocaine: POSITIVE — AB
Opiates: NOT DETECTED
Tetrahydrocannabinol: NOT DETECTED

## 2020-11-23 LAB — APTT: aPTT: 28 seconds (ref 24–36)

## 2020-11-23 MED ORDER — POLYETHYLENE GLYCOL 3350 17 G PO PACK
17.0000 g | PACK | Freq: Every day | ORAL | Status: DC | PRN
Start: 2020-11-23 — End: 2020-11-28

## 2020-11-23 MED ORDER — SODIUM CHLORIDE 0.9 % IV BOLUS
1000.0000 mL | Freq: Once | INTRAVENOUS | Status: AC
  Administered 2020-11-23: 1000 mL via INTRAVENOUS

## 2020-11-23 MED ORDER — FENTANYL 2500MCG IN NS 250ML (10MCG/ML) PREMIX INFUSION
25.0000 ug/h | INTRAVENOUS | Status: DC
  Administered 2020-11-23: 50 ug/h via INTRAVENOUS
  Administered 2020-11-25 – 2020-11-26 (×2): 150 ug/h via INTRAVENOUS
  Administered 2020-11-27: 100 ug/h via INTRAVENOUS
  Administered 2020-11-28: 150 ug/h via INTRAVENOUS
  Filled 2020-11-23 (×7): qty 250

## 2020-11-23 MED ORDER — INSULIN ASPART 100 UNIT/ML IJ SOLN
0.0000 [IU] | INTRAMUSCULAR | Status: DC
  Administered 2020-11-24: 3 [IU] via SUBCUTANEOUS
  Administered 2020-11-24: 2 [IU] via SUBCUTANEOUS
  Administered 2020-11-24: 3 [IU] via SUBCUTANEOUS
  Administered 2020-11-26: 2 [IU] via SUBCUTANEOUS
  Administered 2020-11-26: 3 [IU] via SUBCUTANEOUS
  Administered 2020-11-26: 5 [IU] via SUBCUTANEOUS
  Administered 2020-11-26 (×2): 2 [IU] via SUBCUTANEOUS
  Administered 2020-11-27 (×2): 3 [IU] via SUBCUTANEOUS

## 2020-11-23 MED ORDER — FENTANYL BOLUS VIA INFUSION
25.0000 ug | INTRAVENOUS | Status: DC | PRN
  Administered 2020-11-26 – 2020-11-28 (×8): 25 ug via INTRAVENOUS
  Filled 2020-11-23: qty 25

## 2020-11-23 MED ORDER — SODIUM CHLORIDE 0.9 % IV SOLN: INTRAVENOUS | Status: DC | PRN

## 2020-11-23 MED ORDER — HYDRALAZINE HCL 20 MG/ML IJ SOLN
10.0000 mg | Freq: Once | INTRAMUSCULAR | Status: AC
  Administered 2020-11-23: 10 mg via INTRAVENOUS

## 2020-11-23 MED ORDER — DOCUSATE SODIUM 50 MG/5ML PO LIQD: 100.0000 mg | Freq: Two times a day (BID) | ORAL | Status: DC | PRN

## 2020-11-23 MED ORDER — PANTOPRAZOLE SODIUM 40 MG IV SOLR
40.0000 mg | Freq: Every day | INTRAVENOUS | Status: DC
  Administered 2020-11-24 (×2): 40 mg via INTRAVENOUS
  Filled 2020-11-23 (×2): qty 40

## 2020-11-23 MED ORDER — ETOMIDATE 2 MG/ML IV SOLN
20.0000 mg | Freq: Once | INTRAVENOUS | Status: AC
  Administered 2020-11-23: 20 mg via INTRAVENOUS

## 2020-11-23 MED ORDER — SUCCINYLCHOLINE CHLORIDE 200 MG/10ML IV SOSY
140.0000 mg | PREFILLED_SYRINGE | Freq: Once | INTRAVENOUS | Status: AC
  Administered 2020-11-23: 140 mg via INTRAVENOUS

## 2020-11-23 MED ORDER — IOHEXOL 350 MG/ML SOLN
75.0000 mL | Freq: Once | INTRAVENOUS | Status: AC | PRN
  Administered 2020-11-23: 75 mL via INTRAVENOUS

## 2020-11-23 MED ORDER — FENTANYL CITRATE PF 50 MCG/ML IJ SOSY: 100.0000 ug | PREFILLED_SYRINGE | Freq: Once | INTRAMUSCULAR | Status: DC

## 2020-11-23 MED ORDER — NICARDIPINE HCL 2.5 MG/ML IV SOLN: 1.0000 ug/kg/min | INTRAVENOUS | Status: DC

## 2020-11-23 MED ORDER — PROPOFOL 1000 MG/100ML IV EMUL
0.0000 ug/kg/min | INTRAVENOUS | Status: DC
  Administered 2020-11-23: 5 ug/kg/min via INTRAVENOUS
  Administered 2020-11-24 (×3): 25 ug/kg/min via INTRAVENOUS
  Administered 2020-11-24: 15 ug/kg/min via INTRAVENOUS
  Administered 2020-11-25 – 2020-11-27 (×7): 20 ug/kg/min via INTRAVENOUS
  Administered 2020-11-27: 45 ug/kg/min via INTRAVENOUS
  Administered 2020-11-27: 20 ug/kg/min via INTRAVENOUS
  Administered 2020-11-28: 45 ug/kg/min via INTRAVENOUS
  Administered 2020-11-28: 35 ug/kg/min via INTRAVENOUS
  Administered 2020-11-28: 50 ug/kg/min via INTRAVENOUS
  Filled 2020-11-23 (×19): qty 100

## 2020-11-23 MED ORDER — FENTANYL CITRATE (PF) 100 MCG/2ML IJ SOLN
INTRAMUSCULAR | Status: AC
  Filled 2020-11-23: qty 2

## 2020-11-23 MED ORDER — HYDRALAZINE HCL 20 MG/ML IJ SOLN: 10.0000 mg | Freq: Once | INTRAMUSCULAR | Status: AC

## 2020-11-23 MED ORDER — FENTANYL CITRATE PF 50 MCG/ML IJ SOSY
100.0000 ug | PREFILLED_SYRINGE | Freq: Once | INTRAMUSCULAR | Status: AC
  Administered 2020-11-23: 100 ug via INTRAVENOUS

## 2020-11-23 MED ORDER — HYDRALAZINE HCL 20 MG/ML IJ SOLN
INTRAMUSCULAR | Status: AC
  Filled 2020-11-23: qty 1

## 2020-11-23 MED ORDER — NICARDIPINE HCL IN NACL 20-0.86 MG/200ML-% IV SOLN
INTRAVENOUS | Status: AC
  Administered 2020-11-23: 10 mg/h
  Filled 2020-11-23: qty 200

## 2020-11-23 MED ORDER — VALPROATE SODIUM 100 MG/ML IV SOLN: 250.0000 mg | Freq: Two times a day (BID) | INTRAVENOUS | Status: DC

## 2020-11-23 MED ORDER — NICARDIPINE HCL IN NACL 20-0.86 MG/200ML-% IV SOLN
3.0000 mg/h | INTRAVENOUS | Status: DC
Start: 2020-11-23 — End: 2020-11-26
  Administered 2020-11-24: 12.5 mg/h via INTRAVENOUS
  Administered 2020-11-24: 10 mg/h via INTRAVENOUS
  Administered 2020-11-24: 3 mg/h via INTRAVENOUS
  Administered 2020-11-24: 8 mg/h via INTRAVENOUS
  Administered 2020-11-24: 5 mg/h via INTRAVENOUS
  Administered 2020-11-24: 10 mg/h via INTRAVENOUS
  Administered 2020-11-25: 4.5 mg/h via INTRAVENOUS
  Administered 2020-11-25: 10 mg/h via INTRAVENOUS
  Administered 2020-11-25: 4.5 mg/h via INTRAVENOUS
  Administered 2020-11-25: 7.5 mg/h via INTRAVENOUS
  Administered 2020-11-26 (×2): 15 mg/h via INTRAVENOUS
  Administered 2020-11-26: 3 mg/h via INTRAVENOUS
  Administered 2020-11-26: 15 mg/h via INTRAVENOUS
  Filled 2020-11-23 (×6): qty 200
  Filled 2020-11-23: qty 400
  Filled 2020-11-23 (×9): qty 200

## 2020-11-23 NOTE — ED Provider Notes (Signed)
[facility] EMERGENCY DEPARTMENT Provider Note   CSN: [id number] Arrival date & time: 12/10/2020  2029     History Chief Complaint  Patient presents with   Post CPR    Sharon Norris is a 65 y.o. female.  HPI This is a 65 year old female with history of IV drug use, hepatitis, HIV, hypertension, NSTEMI and prior stroke who presents via EMS after a cardiac arrest.  Patient reportedly had a witnessed collapse at home.  EMS was called and there was no bystander CPR.  EMS found rhythm to be PEA on arrival and patient was given epinephrine x3 and CPR performed for total of 20 minutes and ROSC was achieved.  King airway in place, patient reportedly breathing on her own but unresponsive.    Past Medical History:  Diagnosis Date   Anxiety    Bronchitis    Depression    Excessive cerumen in both ear canals 07/02/2017   H/O drug abuse ([place])    IV drugs, drug free 12 years   Hepatitis    Hep. C   History of suicidal ideation    thought about overdose in the past   HIV infection ([place])    Hypertension    Neuropathy    Sleep difficulties    Trichomonal infection    2009    Patient Active Problem List   Diagnosis Date Noted   Cardiac arrest ([place]) 12/11/2020   Substance use disorder 07/21/2020   Hip pain 06/13/2020   Cerebral embolism with cerebral infarction 11/27/2019   NSTEMI (non-ST elevated myocardial infarction) ([place]) 11/21/2019   Numbness on left side 11/21/2019   Prolonged QT interval 11/21/2019   Hyperglycemia 11/21/2019   Atypical chest pain/L. Shoulder pain 04/10/2019   CKD (chronic kidney disease), stage IV ([place]) 08/25/2018   Constipated 06/28/2018   Health care maintenance 09/05/2013   History of drug use 06/11/2011   Psychiatric disorder 09/17/2010   Peripheral neuropathy ([place]) 04/02/2009   Insomnia 01/15/2009   Chronic back pain 12/12/2007   Human immunodeficiency virus (HIV) disease ([place]) 12/23/2005   History of Chronic hepatitis C without  hepatic coma ([place]) 12/23/2005   Essential hypertension 12/23/2005    Past Surgical History:  Procedure Laterality Date   COLPOSCOPY  2010     OB History     Gravida  3   Para  2   Term  2   Preterm      AB  1   Living         SAB      IAB  1   Ectopic      Multiple      Live Births              Family History  Problem Relation Age of Onset   Alcohol abuse Father    Cancer Maternal Aunt        endometrial   Cancer Sister        endometrial   Cancer Sister        breast    Social History   Tobacco Use   Smoking status: Former    Types: Cigarettes    Quit date: 02/17/2000    Years since quitting: 20.7   Smokeless tobacco: Never  Vaping Use   Vaping Use: Never used  Substance Use Topics   Alcohol use: Yes    Alcohol/week: 0.0 standard drinks    Comment: beer, occasional   Drug use: No    Home Medications Prior  to Admission medications   Medication Sig Start Date End Date Taking? Authorizing Provider  amLODipine (NORVASC) 10 MG tablet TAKE ONE TABLET BY MOUTH DAILY 11/08/20   Mitzi Hansen, MD  ASPIR-LOW 81 MG EC tablet TAKE ONE TABLET BY MOUTH DAILY . SWALLOW WHOLE 11/08/20   Mitzi Hansen, MD  atorvastatin (LIPITOR) 80 MG tablet TAKE ONE TABLET BY MOUTH DAILY 11/08/20   Mitzi Hansen, MD  carvedilol (COREG) 25 MG tablet TAKE ONE TABLET BY MOUTH TWICE A DAY WITH MEALS 11/08/20   Darrick Meigs, Rylee, MD  divalproex (DEPAKOTE ER) 500 MG 24 hr tablet TAKE ONE TABLET BY MOUTH DAILY 11/08/20   Mitzi Hansen, MD  gabapentin (NEURONTIN) 300 MG capsule TAKE TWO CAPSULES BY MOUTH TWICE A DAY 10/04/20   Madalyn Rob, MD  isosorbide mononitrate (IMDUR) 60 MG 24 hr tablet TAKE ONE TABLET BY MOUTH DAILY 11/08/20   Mitzi Hansen, MD  Multiple Vitamin (MULTIVITAMIN WITH MINERALS) TABS tablet Take 1 tablet by mouth daily.    [provider]  nicotine (NICODERM CQ - DOSED IN MG/24 HOURS) 14 mg/24hr patch Place 1 patch (14 mg total) onto the skin  daily. 11/30/19   Mercy Riding, MD  ramelteon (ROZEREM) 8 MG tablet TAKE ONE TABLET BY MOUTH AT BEDTIME AS NEEDED FOR SLEEP 07/02/20   Gaylan Gerold, DO  TRIUMEQ 600-50-300 MG tablet TAKE ONE TABLET BY MOUTH DAILY 10/04/20   Madalyn Rob, MD    Allergies    Benadryl [diphenhydramine hcl], Ribavirin, and Viekira pak [ombitasvir-paritaprevir-ritonavir-dasabuvir]  Review of Systems   Review of Systems  Unable to perform ROS: Intubated   Physical Exam Updated Vital Signs BP (!) 161/73   Pulse 93   Temp (!) 96.3 F (35.7 C) (Temporal)   Resp (!) 27   Ht '5\' 6"'$  (1.676 m)   Wt 100 kg   SpO2 98%   BMI 35.58 kg/m   Physical Exam Vitals and nursing note reviewed.  Constitutional:      Appearance: She is well-developed.     Comments: Ill appearing, unresponsive   HENT:     Head: Normocephalic and atraumatic.  Eyes:     Conjunctiva/sclera: Conjunctivae normal.     Pupils: Pupils are equal, round, and reactive to light.  Cardiovascular:     Rate and Rhythm: Normal rate and regular rhythm.     Heart sounds: No murmur heard. Pulmonary:     Comments: King airway in place, bilateral breath sounds, patient breathing on her own but shallow respirations. Abdominal:     General: Abdomen is flat. There is distension.     Palpations: Abdomen is soft.  Musculoskeletal:        General: No deformity.     Cervical back: Neck supple. No rigidity.     Right lower leg: No edema.     Left lower leg: No edema.  Skin:    General: Skin is warm and dry.     Findings: No bruising or rash.  Neurological:     Mental Status: She is unresponsive.     GCS: GCS eye subscore is 1. GCS verbal subscore is 1. GCS motor subscore is 1.    ED Results / Procedures / Treatments   Labs (all labs ordered are listed, but only abnormal results are displayed) Labs Reviewed  LACTIC ACID, PLASMA - Abnormal; Notable for the following components:      Result Value   Lactic Acid, Venous 3.4 (*)    All other  components within normal limits  CBC WITH DIFFERENTIAL/PLATELET - Abnormal; Notable for the following components:   WBC 18.6 (*)    MCV 101.8 (*)    Neutro Abs 16.0 (*)    Abs Immature Granulocytes 0.24 (*)    All other components within normal limits  COMPREHENSIVE METABOLIC PANEL - Abnormal; Notable for the following components:   Potassium 5.5 (*)    Chloride 112 (*)    CO2 18 (*)    Glucose, Bld 255 (*)    BUN 25 (*)    Creatinine, Ser 1.71 (*)    Calcium 7.5 (*)    Albumin 2.6 (*)    AST 43 (*)    GFR, Estimated 32 (*)    All other components within normal limits  RAPID URINE DRUG SCREEN, HOSP PERFORMED - Abnormal; Notable for the following components:   Cocaine POSITIVE (*)    Benzodiazepines POSITIVE (*)    All other components within normal limits  URINALYSIS, ROUTINE W REFLEX MICROSCOPIC - Abnormal; Notable for the following components:   Color, Urine STRAW (*)    Glucose, UA 50 (*)    Hgb urine dipstick SMALL (*)    Protein, ur 100 (*)    Bacteria, UA FEW (*)    All other components within normal limits  HEMOGLOBIN A1C - Abnormal; Notable for the following components:   Hgb A1c MFr Bld 6.4 (*)    All other components within normal limits  I-STAT ARTERIAL BLOOD GAS, ED - Abnormal; Notable for the following components:   pH, Arterial 7.155 (*)    pCO2 arterial 54.5 (*)    pO2, Arterial 228 (*)    Bicarbonate 19.2 (*)    TCO2 21 (*)    Acid-base deficit 10.0 (*)    All other components within normal limits  RESP PANEL BY RT-PCR (FLU A&B, COVID) ARPGX2  CULTURE, BLOOD (ROUTINE X 2)  CULTURE, BLOOD (ROUTINE X 2)  APTT  TRIGLYCERIDES  LACTIC ACID, PLASMA  ETHANOL  CBC  BASIC METABOLIC PANEL  BLOOD GAS, ARTERIAL  MAGNESIUM  PHOSPHORUS  VALPROIC ACID LEVEL  TROPONIN I (HIGH SENSITIVITY)    EKG EKG Interpretation  Date/Time:  Saturday November 23 2020 20:34:01 EDT Ventricular Rate:  96 PR Interval:  184 QRS Duration: 86 QT Interval:  346 QTC  Calculation: 438 R Axis:   59 Text Interpretation: Sinus rhythm Biatrial enlargement Probable LVH with secondary repol abnrm Baseline wander in lead(s) I III aVL No significant change since last tracing Confirmed by Isla Pence [phone]) on 11/16/2020 9:38:34 PM  Radiology CT HEAD WO CONTRAST (5MM)  Result Date: 12/11/2020 CLINICAL DATA:  Altered mental status EXAM: CT HEAD WITHOUT CONTRAST TECHNIQUE: Contiguous axial images were obtained from the base of the skull through the vertex without intravenous contrast. COMPARISON:  11/21/2019 FINDINGS: Brain: Normal anatomic configuration. Parenchymal volume loss is commensurate with the patient's age. Remote lacunar infarct noted within the left basal ganglia, unchanged. No abnormal intra or extra-axial mass lesion or fluid collection. No abnormal mass effect or midline shift. No evidence of acute intracranial hemorrhage or infarct. Ventricular size is normal. Cerebellum unremarkable. Vascular: No asymmetric hyperdense vasculature at the skull base. Skull: Intact Sinuses/Orbits: There is dense opacification of the right maxillary sinus, new since prior examination. Remaining paranasal sinuses are clear. Orbits are unremarkable. Other: Mastoid air cells and middle ear cavities are clear. IMPRESSION: No acute intracranial abnormality. Remote lacunar infarct as described above. Right maxillary sinus disease, new since prior examination. Electronically Signed   By: Cassandria Anger  Christa See M.D.   On: 12/16/2020 23:28   CT Angio Chest PE W and/or Wo Contrast  Result Date: 11/19/2020 CLINICAL DATA:  PE suspected, high prob. Dyspnea, altered mental status EXAM: CT ANGIOGRAPHY CHEST WITH CONTRAST TECHNIQUE: Multidetector CT imaging of the chest was performed using the standard protocol during bolus administration of intravenous contrast. Multiplanar CT image reconstructions and MIPs were obtained to evaluate the vascular anatomy. CONTRAST:  29m OMNIPAQUE IOHEXOL 350 MG/ML  SOLN COMPARISON:  06/21/2020 FINDINGS: Cardiovascular: There is suboptimal opacification of the pulmonary arterial tree with preferential opacification of the aorta. The examination is adequate only for exclusion of a central pulmonary embolism involving the main, right and left, and lobar pulmonary arteries of which there is none. The segmental and subsegmental pulmonary arteries are not adequately opacified to definitively exclude the presence of small pulmonary emboli. The central pulmonary arteries are of normal caliber. There is hypoenhancement of the left ventricular apex and mid cavity and apical septum which may reflect changes of an acute myocardial infarction. This appears new since prior examination. Cardiac size within normal limits. No pericardial effusion. Mild atherosclerotic calcification within the thoracic aorta. No aortic aneurysm. Mediastinum/Nodes: No pathologic thoracic adenopathy. Nasogastric tube extends into the mid body of the stomach beyond the margin of the examination. Endotracheal tube is seen 2.5 cm above the carina. Lungs/Pleura: There is peribronchovascular pulmonary infiltrate and mild smooth septal thickening as well as scattered ground-glass pulmonary infiltrate all most in keeping with mild pulmonary edema, possibly cardiogenic in nature. Mild bibasilar atelectasis. No pneumothorax. Trace bilateral pleural effusions are present. Upper Abdomen: Cholelithiasis without pericholecystic inflammatory change. No acute abnormality. Musculoskeletal: No acute bone abnormality. No lytic or blastic bone lesion. Review of the MIP images confirms the above findings. IMPRESSION: Technically limited examination without evidence of pulmonary embolism through the lobar pulmonary arterial tree. The segmental and subsegmental pulmonary arteries are not adequately opacified to exclude the presence of small pulmonary emboli. Hypoenhancement of the left ventricular apex and mid cavity and apical septal  walls suspicious for myocardial ischemia and/or infarction. Correlation with cardiac enzymes and EKG examination is recommended for further evaluation. Mild peribronchovascular and interstitial pulmonary infiltrate most in keeping with mild pulmonary edema, likely cardiogenic in nature. Trace bilateral pleural effusions. Support tubes in appropriate position. Aortic Atherosclerosis (ICD10-I70.0). These results were called by telephone at the time of interpretation on 11/18/2020 at 11:21 pm to [facility], who verbally acknowledged these results. Electronically Signed   By: AFidela SalisburyM.D.   On: 11/17/2020 23:23   DG Chest Portable 1 View  Result Date: 12/09/2020 CLINICAL DATA:  Intubated EXAM: PORTABLE CHEST 1 VIEW COMPARISON:  06/21/2020 FINDINGS: Single frontal view of the chest demonstrates endotracheal tube overlying tracheal air column tip midway between thoracic inlet and carina. Enteric catheter passes below diaphragm, coiled over the gastric fundus. Tip of catheter is excluded by collimation. Cardiac silhouette is stable. There is increased central vascular congestion with bilateral perihilar airspace disease greatest in the upper lobes. No effusion or pneumothorax. No acute bony abnormalities. IMPRESSION: 1. No complication after intubation. 2. Increased vascular congestion with bilateral perihilar airspace disease most consistent with edema. Electronically Signed   By: MRanda NgoM.D.   On: 12/03/2020 21:36    Procedures .Critical Care E&M Performed by: HIsla Pence MD  Critical care provider statement:    Critical care time (minutes):  60   Critical care start time:  11/27/2020 9:33 PM   Critical care end time:  12/06/2020 10:33 PM   Critical care time was exclusive of:  Separately billable procedures and treating other patients and teaching time   Critical care was necessary to treat or prevent imminent or life-threatening deterioration of the following conditions:   Cardiac failure and CNS failure or compromise   Critical care was time spent personally by me on the following activities:  Discussions with consultants, evaluation of patient's response to treatment, examination of patient, vascular access procedures, re-evaluation of patient's condition, ordering and review of radiographic studies, ordering and review of laboratory studies, ordering and performing treatments and interventions and blood draw for specimens   Care discussed with: admitting provider   After initial E/M assessment, critical care services were subsequently performed that were exclusive of separately billable procedures or treatment.   Procedure Name: Intubation Date/Time: 11/21/2020 10:54 PM Performed by: Coralee Pesa, MD Pre-anesthesia Checklist: Emergency Drugs available, Suction available, Patient identified and Patient being monitored Oxygen Delivery Method: Ambu bag Preoxygenation: Pre-oxygenation with 100% oxygen Induction Type: Rapid sequence Laryngoscope Size: 3 and Glidescope Grade View: Grade I Tube size: 8.0 mm Number of attempts: 1 Airway Equipment and Method: Video-laryngoscopy Placement Confirmation: ETT inserted through vocal cords under direct vision, Positive ETCO2, Breath sounds checked- equal and bilateral and CO2 detector Tube secured with: ETT holder    .Central Line  Date/Time: 12/07/2020 10:55 PM Performed by: Coralee Pesa, MD Authorized by: Isla Pence, MD   Consent:    Consent obtained:  Emergent situation Universal protocol:    Patient identity confirmed:  Arm band Pre-procedure details:    Indication(s): central venous access and insufficient peripheral access     Hand hygiene: Hand hygiene performed prior to insertion     Sterile barrier technique: All elements of maximal sterile technique followed     Skin preparation:  Chlorhexidine   Skin preparation agent: Skin preparation agent completely dried prior to procedure    Sedation:    Sedation type:  Moderate sedation Procedure details:    Location:  R femoral   Patient position:  Supine   Procedural supplies:  Triple lumen   Landmarks identified: yes     Ultrasound guidance: no     Number of attempts:  3   Successful placement: yes   Post-procedure details:    Post-procedure:  Dressing applied and line sutured   Assessment:  Blood return through all ports and free fluid flow   Procedure completion:  Tolerated   Medications Ordered in ED Medications  fentaNYL (SUBLIMAZE) injection 100 mcg (100 mcg Intravenous Not Given 11/29/2020 2055)  fentaNYL 254mg in NS 2575m(1012mml) infusion-PREMIX (100 mcg/hr Intravenous Rate/Dose Change 11/30/2020 2342)  fentaNYL (SUBLIMAZE) bolus via infusion 25 mcg (has no administration in time range)  propofol (DIPRIVAN) 1000 MG/100ML infusion (25 mcg/kg/min  100 kg (Order-Specific) Intravenous Rate/Dose Change 11/29/2020 2342)  docusate (COLACE) 50 MG/5ML liquid 100 mg (has no administration in time range)  polyethylene glycol (MIRALAX / GLYCOLAX) packet 17 g (has no administration in time range)  pantoprazole (PROTONIX) injection 40 mg (has no administration in time range)  insulin aspart (novoLOG) injection 0-15 Units (has no administration in time range)  0.9 %  sodium chloride infusion (has no administration in time range)  valproate (DEPACON) 250 mg in dextrose 5 % 50 mL IVPB (has no administration in time range)  hydrALAZINE (APRESOLINE) injection 10 mg (has no administration in time range)  nicardipine (CARDENE) '20mg'$  in 0.86% saline 200m2m infusion (0.1 mg/ml) (has no administration in time  range)  hydrALAZINE (APRESOLINE) 20 MG/ML injection (has no administration in time range)  etomidate (AMIDATE) injection 20 mg (20 mg Intravenous Given 11/20/2020 2035)  succinylcholine (ANECTINE) syringe 140 mg (140 mg Intravenous Given 11/29/2020 2035)  fentaNYL (SUBLIMAZE) injection 100 mcg (100 mcg Intravenous Given 11/25/2020 2038)   fentaNYL (SUBLIMAZE) 100 MCG/2ML injection (  Given 11/20/2020 2054)  sodium chloride 0.9 % bolus 1,000 mL (1,000 mLs Intravenous New Bag/Given 12/15/2020 2100)  iohexol (OMNIPAQUE) 350 MG/ML injection 75 mL (75 mLs Intravenous Contrast Given 11/21/2020 2304)  hydrALAZINE (APRESOLINE) injection 10 mg (10 mg Intravenous Given 11/16/2020 2325)  niCARdipine in saline (CARDENE-IV) 20-0.86 MG/200ML-% infusion SOLN (10 mg/hr  New Bag/Given 12/14/2020 2335)    ED Course  I have reviewed the triage vital signs and the nursing notes.  Pertinent labs & imaging results that were available during my care of the patient were reviewed by me and considered in my medical decision making (see chart for details).    MDM Rules/Calculators/A&P                          On arrival [facility] airway appears to be in the correct place and patient has bilateral breath sounds and satting greater than 95%.  She has 2+ radial pulses and appears to be in normal sinus rhythm with rate in the 70s.  Patient is hypertensive on an epinephrine drip initiated by EMS.  Not responsive to any painful stimuli.  Pupils are 3 mm and reactive bilaterally.  GCS of 3.  Intubated for airway protection as above without complication.   Difficulty obtaining peripheral IV access.  Eventually a triple-lumen was placed in the right groin.  Started sedation with propofol and fentanyl.  Broad work-up initiated.  Initial lab work with white blood count of 18.6 which could represent infection versus reactive in the setting of postarrest, But hemoglobin is normal at 12.5.  CMP reveals a creatinine of 1.7 and potassium of 5.5 but no EKG changes consistent with hyperkalemia.  Initial lactate elevated at 3.4, likely in the setting of postcardiac arrest. Blood cultures in process.   CXR reveals mild pulmonary edema, no pneumothorax or obvious infection. CT head and CTA chest in process. No clear cause of cardiac arrest. UDS positive for cocaine, likely contributing.    CT  of the head without acute intracranial process to explain collapse and arrest.  CTA of the chest without PE but concerning for some cardiac hypoperfusion.  Cardiology consulted who will come to the bedside to evaluate the patient.  Troponin is pending.    Admitted to ICU.    Final Clinical Impression(s) / ED Diagnoses Final diagnoses:  Cardiac arrest ([employer])  Glasgow coma scale total score 3-8, in the field (EMT or ambulance) [facility])    Rx / DC Orders ED Discharge Orders     None        Coralee Pesa, MD 12/08/2020 2348    Isla Pence, MD 11/24/20 [phone]

## 2020-11-23 NOTE — ED Provider Notes (Signed)
  Physical Exam  BP (!) 165/89   Pulse 78   Temp (!) 96.3 F (35.7 C) (Temporal)   Resp 16   Ht '5\' 6"'$  (1.676 m)   Wt 100 kg   SpO2 99%   BMI 35.58 kg/m   Physical Exam  ED Course/Procedures     .Central Line  Date/Time: 11/19/2020 10:26 PM Performed by: Isla Pence, MD Authorized by: Isla Pence, MD   Consent:    Consent obtained:  Emergent situation Universal protocol:    Patient identity confirmed:  Arm band Pre-procedure details:    Indication(s): central venous access, hemodynamic monitoring and insufficient peripheral access     Hand hygiene: Hand hygiene performed prior to insertion     Sterile barrier technique: All elements of maximal sterile technique followed     Skin preparation:  Chlorhexidine   Skin preparation agent: Skin preparation agent completely dried prior to procedure   Sedation:    Sedation type:  Deep Procedure details:    Location:  R femoral   Patient position:  Supine   Procedural supplies:  Triple lumen   Catheter size:  7 Fr   Landmarks identified: yes     Ultrasound guidance: no     Number of attempts:  5 or more   Successful placement: yes   Post-procedure details:    Post-procedure:  Dressing applied and line sutured   Assessment:  Blood return through all ports and free fluid flow   Procedure completion:  Tolerated well, no immediate complications  MDM         Isla Pence, MD 12/02/2020 2227

## 2020-11-23 NOTE — ED Notes (Signed)
Attempted to call report

## 2020-11-23 NOTE — ED Notes (Signed)
CT notified on results of blood test.

## 2020-11-23 NOTE — ED Notes (Signed)
Admitting MD at bedside.

## 2020-11-23 NOTE — ED Notes (Signed)
EDP at bedside attempting to insert central line .

## 2020-11-23 NOTE — ED Notes (Signed)
Patient transported to CT scan . 

## 2020-11-23 NOTE — ED Triage Notes (Addendum)
Patient arrived with EMS from home post CPR , found unresponsive by family this evening , PEA - received 3 doses of Epi , Versed 2.5 mg and Fentanyl 50 mcg prior to arrival , intubated by EDP at arrival . 2 I/O access.

## 2020-11-23 NOTE — ED Notes (Signed)
Ice packs applied to groin/axilla.

## 2020-11-23 NOTE — Procedures (Signed)
Arterial Catheter Insertion Procedure Note  Sharon Norris  [id number]  08/25/1955  Date:12/02/2020  Time:11:22 PM    Provider Performing: Dimple Nanas    Procedure: Insertion of Arterial Line [phone]) without US guidance  Indication(s) Blood pressure monitoring and/or need for frequent ABGs  Consent Unable to obtain consent due to emergent nature of procedure.  Anesthesia None   Time Out Verified patient identification, verified procedure, site/side was marked, verified correct patient position, special equipment/implants available, medications/allergies/relevant history reviewed, required imaging and test results available.   Sterile Technique Maximal sterile technique including full sterile barrier drape, hand hygiene, sterile gown, sterile gloves, mask, hair covering, sterile ultrasound probe cover (if used).   Procedure Description Area of catheter insertion was cleaned with chlorhexidine and draped in sterile fashion. With real-time ultrasound guidance an arterial catheter was placed into the left radial artery.  Appropriate arterial tracings confirmed on monitor.     Complications/Tolerance None; patient tolerated the procedure well.   EBL Minimal   Specimen(s) None

## 2020-11-23 NOTE — H&P (Signed)
NAME:  Sharon Norris, MRN:  [id number], DOB:  [birth year], LOS: 0 ADMISSION DATE:  12/09/2020, CONSULTATION DATE:  12/04/2020 REFERRING MD:  Little Ishikawa, MD, CHIEF COMPLAINT:  PEA cardiac arrest   History of Present Illness:  Sharon Norris is a 65 year old lady with PMH significant for IVDA, hepatitis C, hypertension among other conditions.  It was reported that the patient had a witnessed cardiac arrest at home.  There were no bystander CPR. EMS arrived and found patient in PEA. POSC was archived after a total of 20 minutes of ACLS protocol.  Patient had transient ROSC at 10 minutes but lost pulses but regained it after a second 10 minutes of ACLS. Protocol.  She was intubated in the ED as she arrived with a [facility] airway.  As per the patient's grand daughter, the patient had complained of chest pain intermittently the day prior to presentation.    Pertinent  Medical History  IV cocaine abuse HIV disease hypertension  Significant Hospital Events: Including procedures, antibiotic start and stop dates in addition to other pertinent events    12/13/2020 Intubation; right femoral TLC; Left radial arterial line   Objective   Blood pressure (!) 152/85, pulse 78, temperature (!) 96.3 F (35.7 C), temperature source Temporal, resp. rate (!) 24, height '5\' 6"'$  (1.676 m), weight 100 kg, SpO2 98 %.    Vent Mode: PRVC FiO2 (%):  [50 %-100 %] 50 % Set Rate:  [16 bmp-24 bmp] 24 bmp Vt Set:  [470 mL] 470 mL PEEP:  [5 cmH20] 5 cmH20 Plateau Pressure:  [28 cmH20] 28 cmH20  No intake or output data in the 24 hours ending 12/06/2020 2235 Filed Weights   11/18/2020 2029  Weight: 100 kg    Examination: General: obese , on vent support HENT: atraumatic, normocephalic, pupils pinpoint but symmetrical Lungs: good air entry bilateral, occasional wheezes Cardiovascular: sinus tachycardia Abdomen: obese, soft, inaudible bowel sounds Extremities: no oedema, doppler DP pulses Neuro: no response to noxious  stimuli, + cough reflex GU: foley     Assessment & Plan:  Cardiopulmonary arrest. Patient had no purposeful movements Plan: TTM, will need EEG and neurology prognosis at 72 hours  2. Acute NSTEMI- ECG consistent with ischemic changes in anterolateral leads.  ? Cocaine induced vs plaque rupture Plan: Heparin drip, Aspirin, will hold off on beta blocker because of cocaine positive on toxicology screen. Check lipids.  3. Malignant hypertension- CT head negative Plan: Target BP management  to <180 mm hg with Cardene drip especially as PT is on Heparin drip.  4. Acute hypoxic and hypercarbic respiratory failure secondary  Plan: lung protective vent protocol  5. Acute kidney injury superimposed on CKD 3b Plan: avoid nephrotoxins.  6. Hx of substance abuse with positive toxicology screen for cocaine. Plan: caution for withdrawals, behaviour modification.  7. Hx HIV disease - Hx of hepatitis C Plan: infectious control per hospital protocol.  Best Practice (right click and "Reselect all SmartList Selections" daily)   Diet/type: NPO DVT prophylaxis: systemic heparin GI prophylaxis: PPI Lines: Central line, Arterial line Foley:  Yes, and it is still needed Code Status:  full code Last date of multidisciplinary goals of care discussion   Family: long and multiple discussions with patients large family including daughter [name] and grand daughter.  They understand the severity of her illness and the likelihood of brain injury from cardiac arrest.  They wished to continue on FULL code status.  Labs   CBC: Recent Labs  Lab 11/27/2020 2118 12/14/2020 2131  WBC 18.6*  --   NEUTROABS 16.0*  --   HGB 12.5 12.6  HCT 39.4 37.0  MCV 101.8*  --   PLT 236  --     Basic Metabolic Panel: Recent Labs  Lab 11/27/2020 2118 12/13/2020 2131  NA 138 141  K 5.5* 4.9  CL 112*  --   CO2 18*  --   GLUCOSE 255*  --   BUN 25*  --   CREATININE 1.71*  --   CALCIUM 7.5*  --     GFR: Estimated Creatinine Clearance: 38.1 mL/min (A) (by C-G formula based on SCr of 1.71 mg/dL (H)). Recent Labs  Lab 11/21/2020 2118  WBC 18.6*  LATICACIDVEN 3.4*    Liver Function Tests: Recent Labs  Lab 12/03/2020 2118  AST 43*  ALT 19  ALKPHOS 77  BILITOT 0.7  PROT 6.6  ALBUMIN 2.6*   No results for input(s): LIPASE, AMYLASE in the last 168 hours. No results for input(s): AMMONIA in the last 168 hours.  ABG    Component Value Date/Time   PHART 7.155 (LL) 12/02/2020 2131   PCO2ART 54.5 (H) 11/24/2020 2131   PO2ART 228 (H) 11/22/2020 2131   HCO3 19.2 (L) 11/21/2020 2131   TCO2 21 (L) 11/24/2020 2131   ACIDBASEDEF 10.0 (H) 11/21/2020 2131   O2SAT 100.0 12/01/2020 2131     Coagulation Profile: No results for input(s): INR, PROTIME in the last 168 hours.  Cardiac Enzymes: No results for input(s): CKTOTAL, CKMB, CKMBINDEX, TROPONINI in the last 168 hours.  HbA1C: Hgb A1c MFr Bld  Date/Time Value Ref Range Status  11/21/2019 06:04 AM 6.2 (H) 4.8 - 5.6 % Final    Comment:    (NOTE) Pre diabetes:          5.7%-6.4%  Diabetes:              >6.4%  Glycemic control for   <7.0% adults with diabetes     CBG: No results for input(s): GLUCAP in the last 168 hours.  Review of Systems:   Unable to obtain as patient on vent.  Past Medical History:  She,  has a past medical history of Anxiety, Bronchitis, Depression, Excessive cerumen in both ear canals (07/02/2017), H/O drug abuse ([place]), Hepatitis, History of suicidal ideation, HIV infection ([place]), Hypertension, Neuropathy, Sleep difficulties, and Trichomonal infection.   Surgical History:   Past Surgical History:  Procedure Laterality Date   COLPOSCOPY  2010     Social History:   reports that she quit smoking about 20 years ago. She has never used smokeless tobacco. She reports current alcohol use. She reports that she does not use drugs.   Family History:  Her family history includes Alcohol abuse in her  father; Cancer in her maternal aunt, sister, and sister.   Allergies Allergies  Allergen Reactions   Benadryl [Diphenhydramine Hcl] Hypertension   Ribavirin Rash   Viekira Pak [Ombitasvir-Paritaprevir-Ritonavir-Dasabuvir] Rash    Hives like reaction     Home Medications  Prior to Admission medications   Medication Sig Start Date End Date Taking? Authorizing Provider  amLODipine (NORVASC) 10 MG tablet TAKE ONE TABLET BY MOUTH DAILY 11/08/20   Mitzi Hansen, MD  ASPIR-LOW 81 MG EC tablet TAKE ONE TABLET BY MOUTH DAILY . SWALLOW WHOLE 11/08/20   Mitzi Hansen, MD  atorvastatin (LIPITOR) 80 MG tablet TAKE ONE TABLET BY MOUTH DAILY 11/08/20   Mitzi Hansen, MD  carvedilol (COREG) 25 MG tablet TAKE  ONE TABLET BY MOUTH TWICE A DAY WITH MEALS 11/08/20   Mitzi Hansen, MD  divalproex (DEPAKOTE ER) 500 MG 24 hr tablet TAKE ONE TABLET BY MOUTH DAILY 11/08/20   Mitzi Hansen, MD  gabapentin (NEURONTIN) 300 MG capsule TAKE TWO CAPSULES BY MOUTH TWICE A DAY 10/04/20   Madalyn Rob, MD  isosorbide mononitrate (IMDUR) 60 MG 24 hr tablet TAKE ONE TABLET BY MOUTH DAILY 11/08/20   Mitzi Hansen, MD  Multiple Vitamin (MULTIVITAMIN WITH MINERALS) TABS tablet Take 1 tablet by mouth daily.    [provider]  nicotine (NICODERM CQ - DOSED IN MG/24 HOURS) 14 mg/24hr patch Place 1 patch (14 mg total) onto the skin daily. 11/30/19   Mercy Riding, MD  ramelteon (ROZEREM) 8 MG tablet TAKE ONE TABLET BY MOUTH AT BEDTIME AS NEEDED FOR SLEEP 07/02/20   Gaylan Gerold, DO  TRIUMEQ 600-50-300 MG tablet TAKE ONE TABLET BY MOUTH DAILY 10/04/20   Madalyn Rob, MD     Critical care time: 120 minutes

## 2020-11-24 ENCOUNTER — Inpatient Hospital Stay (HOSPITAL_COMMUNITY): Payer: Medicare Other

## 2020-11-24 DIAGNOSIS — I214 Non-ST elevation (NSTEMI) myocardial infarction: Secondary | ICD-10-CM | POA: Diagnosis not present

## 2020-11-24 DIAGNOSIS — I469 Cardiac arrest, cause unspecified: Secondary | ICD-10-CM

## 2020-11-24 LAB — POCT I-STAT 7, (LYTES, BLD GAS, ICA,H+H)
Acid-base deficit: 8 mmol/L — ABNORMAL HIGH (ref 0.0–2.0)
Acid-base deficit: 8 mmol/L — ABNORMAL HIGH (ref 0.0–2.0)
Acid-base deficit: 8 mmol/L — ABNORMAL HIGH (ref 0.0–2.0)
Acid-base deficit: 9 mmol/L — ABNORMAL HIGH (ref 0.0–2.0)
Bicarbonate: 15.4 mmol/L — ABNORMAL LOW (ref 20.0–28.0)
Bicarbonate: 15.2 mmol/L — ABNORMAL LOW (ref 20.0–28.0)
Bicarbonate: 15.9 mmol/L — ABNORMAL LOW (ref 20.0–28.0)
Bicarbonate: 16.6 mmol/L — ABNORMAL LOW (ref 20.0–28.0)
Calcium, Ion: 1.13 mmol/L — ABNORMAL LOW (ref 1.15–1.40)
Calcium, Ion: 1.11 mmol/L — ABNORMAL LOW (ref 1.15–1.40)
Calcium, Ion: 1.12 mmol/L — ABNORMAL LOW (ref 1.15–1.40)
Calcium, Ion: 1.14 mmol/L — ABNORMAL LOW (ref 1.15–1.40)
HCT: 33 % — ABNORMAL LOW (ref 36.0–46.0)
HCT: 31 % — ABNORMAL LOW (ref 36.0–46.0)
HCT: 28 % — ABNORMAL LOW (ref 36.0–46.0)
HCT: 34 % — ABNORMAL LOW (ref 36.0–46.0)
Hemoglobin: 11.2 g/dL — ABNORMAL LOW (ref 12.0–15.0)
Hemoglobin: 10.5 g/dL — ABNORMAL LOW (ref 12.0–15.0)
Hemoglobin: 11.6 g/dL — ABNORMAL LOW (ref 12.0–15.0)
Hemoglobin: 9.5 g/dL — ABNORMAL LOW (ref 12.0–15.0)
O2 Saturation: 94 %
O2 Saturation: 95 %
O2 Saturation: 97 %
O2 Saturation: 97 %
Patient temperature: 35.7
Patient temperature: 37.6
Patient temperature: 37.1
Patient temperature: 37.2
Potassium: 3.7 mmol/L (ref 3.5–5.1)
Potassium: 3.7 mmol/L (ref 3.5–5.1)
Potassium: 3.4 mmol/L — ABNORMAL LOW (ref 3.5–5.1)
Potassium: 3.4 mmol/L — ABNORMAL LOW (ref 3.5–5.1)
Sodium: 143 mmol/L (ref 135–145)
Sodium: 143 mmol/L (ref 135–145)
Sodium: 142 mmol/L (ref 135–145)
Sodium: 142 mmol/L (ref 135–145)
TCO2: 16 mmol/L — ABNORMAL LOW (ref 22–32)
TCO2: 16 mmol/L — ABNORMAL LOW (ref 22–32)
TCO2: 17 mmol/L — ABNORMAL LOW (ref 22–32)
TCO2: 17 mmol/L — ABNORMAL LOW (ref 22–32)
pCO2 arterial: 23.9 mmHg — ABNORMAL LOW (ref 32.0–48.0)
pCO2 arterial: 24.9 mmHg — ABNORMAL LOW (ref 32.0–48.0)
pCO2 arterial: 28.5 mmHg — ABNORMAL LOW (ref 32.0–48.0)
pCO2 arterial: 30.2 mmHg — ABNORMAL LOW (ref 32.0–48.0)
pH, Arterial: 7.411 (ref 7.350–7.450)
pH, Arterial: 7.397 (ref 7.350–7.450)
pH, Arterial: 7.348 — ABNORMAL LOW (ref 7.350–7.450)
pH, Arterial: 7.355 (ref 7.350–7.450)
pO2, Arterial: 64 mmHg — ABNORMAL LOW (ref 83.0–108.0)
pO2, Arterial: 74 mmHg — ABNORMAL LOW (ref 83.0–108.0)
pO2, Arterial: 89 mmHg (ref 83.0–108.0)
pO2, Arterial: 95 mmHg (ref 83.0–108.0)

## 2020-11-24 LAB — COMPREHENSIVE METABOLIC PANEL
ALT: 27 U/L (ref 0–44)
ALT: 31 U/L (ref 0–44)
ALT: 30 U/L (ref 0–44)
AST: 66 U/L — ABNORMAL HIGH (ref 15–41)
AST: 80 U/L — ABNORMAL HIGH (ref 15–41)
AST: 63 U/L — ABNORMAL HIGH (ref 15–41)
Albumin: 2.6 g/dL — ABNORMAL LOW (ref 3.5–5.0)
Albumin: 2.4 g/dL — ABNORMAL LOW (ref 3.5–5.0)
Albumin: 2.4 g/dL — ABNORMAL LOW (ref 3.5–5.0)
Alkaline Phosphatase: 67 U/L (ref 38–126)
Alkaline Phosphatase: 62 U/L (ref 38–126)
Alkaline Phosphatase: 57 U/L (ref 38–126)
Anion gap: 9 (ref 5–15)
Anion gap: 9 (ref 5–15)
Anion gap: 12 (ref 5–15)
BUN: 27 mg/dL — ABNORMAL HIGH (ref 8–23)
BUN: 23 mg/dL (ref 8–23)
BUN: 22 mg/dL (ref 8–23)
CO2: 15 mmol/L — ABNORMAL LOW (ref 22–32)
CO2: 16 mmol/L — ABNORMAL LOW (ref 22–32)
CO2: 15 mmol/L — ABNORMAL LOW (ref 22–32)
Calcium: 7.7 mg/dL — ABNORMAL LOW (ref 8.9–10.3)
Calcium: 7.6 mg/dL — ABNORMAL LOW (ref 8.9–10.3)
Calcium: 7.5 mg/dL — ABNORMAL LOW (ref 8.9–10.3)
Chloride: 113 mmol/L — ABNORMAL HIGH (ref 98–111)
Chloride: 113 mmol/L — ABNORMAL HIGH (ref 98–111)
Chloride: 111 mmol/L (ref 98–111)
Creatinine, Ser: 1.49 mg/dL — ABNORMAL HIGH (ref 0.44–1.00)
Creatinine, Ser: 1.5 mg/dL — ABNORMAL HIGH (ref 0.44–1.00)
Creatinine, Ser: 1.55 mg/dL — ABNORMAL HIGH (ref 0.44–1.00)
GFR, Estimated: 38 mL/min — ABNORMAL LOW (ref >60)
GFR, Estimated: 38 mL/min — ABNORMAL LOW (ref >60)
GFR, Estimated: 36 mL/min — ABNORMAL LOW (ref >60)
Glucose, Bld: 156 mg/dL — ABNORMAL HIGH (ref 70–99)
Glucose, Bld: 132 mg/dL — ABNORMAL HIGH (ref 70–99)
Glucose, Bld: 113 mg/dL — ABNORMAL HIGH (ref 70–99)
Potassium: 3.6 mmol/L (ref 3.5–5.1)
Potassium: 3.7 mmol/L (ref 3.5–5.1)
Potassium: 3.4 mmol/L — ABNORMAL LOW (ref 3.5–5.1)
Sodium: 137 mmol/L (ref 135–145)
Sodium: 138 mmol/L (ref 135–145)
Sodium: 138 mmol/L (ref 135–145)
Total Bilirubin: 0.6 mg/dL (ref 0.3–1.2)
Total Bilirubin: 0.2 mg/dL — ABNORMAL LOW (ref 0.3–1.2)
Total Bilirubin: 0.5 mg/dL (ref 0.3–1.2)
Total Protein: 6.4 g/dL — ABNORMAL LOW (ref 6.5–8.1)
Total Protein: 6.1 g/dL — ABNORMAL LOW (ref 6.5–8.1)
Total Protein: 6.1 g/dL — ABNORMAL LOW (ref 6.5–8.1)

## 2020-11-24 LAB — ECHOCARDIOGRAM COMPLETE
Area-P 1/2: 2.66 cm2
Height: 66 in
S' Lateral: 2.3 cm
Single Plane A2C EF: 51.8 %
Weight: 3224.01 oz

## 2020-11-24 LAB — CBC
HCT: 34.1 % — ABNORMAL LOW (ref 36.0–46.0)
Hemoglobin: 11.5 g/dL — ABNORMAL LOW (ref 12.0–15.0)
MCH: 32.5 pg (ref 26.0–34.0)
MCHC: 33.7 g/dL (ref 30.0–36.0)
MCV: 96.3 fL (ref 80.0–100.0)
Platelets: 184 10*3/uL (ref 150–400)
RBC: 3.54 MIL/uL — ABNORMAL LOW (ref 3.87–5.11)
RDW: 13.2 % (ref 11.5–15.5)
WBC: 13.1 10*3/uL — ABNORMAL HIGH (ref 4.0–10.5)
nRBC: 0 % (ref 0.0–0.2)

## 2020-11-24 LAB — MAGNESIUM
Magnesium: 1.7 mg/dL (ref 1.7–2.4)
Magnesium: 3 mg/dL — ABNORMAL HIGH (ref 1.7–2.4)
Magnesium: 2.2 mg/dL (ref 1.7–2.4)
Magnesium: 2 mg/dL (ref 1.7–2.4)

## 2020-11-24 LAB — GLUCOSE, CAPILLARY
Glucose-Capillary: 110 mg/dL — ABNORMAL HIGH (ref 70–99)
Glucose-Capillary: 132 mg/dL — ABNORMAL HIGH (ref 70–99)
Glucose-Capillary: 154 mg/dL — ABNORMAL HIGH (ref 70–99)
Glucose-Capillary: 165 mg/dL — ABNORMAL HIGH (ref 70–99)
Glucose-Capillary: 75 mg/dL (ref 70–99)
Glucose-Capillary: 92 mg/dL (ref 70–99)
Glucose-Capillary: 94 mg/dL (ref 70–99)

## 2020-11-24 LAB — PHOSPHORUS
Phosphorus: 1.7 mg/dL — ABNORMAL LOW (ref 2.5–4.6)
Phosphorus: 4.3 mg/dL (ref 2.5–4.6)
Phosphorus: 4.2 mg/dL (ref 2.5–4.6)

## 2020-11-24 LAB — HEPARIN LEVEL (UNFRACTIONATED)
Heparin Unfractionated: 0.17 IU/mL — ABNORMAL LOW (ref 0.30–0.70)
Heparin Unfractionated: 0.37 IU/mL (ref 0.30–0.70)

## 2020-11-24 LAB — LIPID PANEL
Cholesterol: 167 mg/dL (ref 0–200)
HDL: 36 mg/dL — ABNORMAL LOW (ref >40)
LDL Cholesterol: 89 mg/dL (ref 0–99)
Total CHOL/HDL Ratio: 4.6 RATIO
Triglycerides: 208 mg/dL — ABNORMAL HIGH (ref <150)
VLDL: 42 mg/dL — ABNORMAL HIGH (ref 0–40)

## 2020-11-24 LAB — ETHANOL: Alcohol, Ethyl (B): <10 mg/dL (ref <10)

## 2020-11-24 LAB — LACTIC ACID, PLASMA: Lactic Acid, Venous: 1.6 mmol/L (ref 0.5–1.9)

## 2020-11-24 LAB — TROPONIN I (HIGH SENSITIVITY)
Troponin I (High Sensitivity): 1799 ng/L (ref <18)
Troponin I (High Sensitivity): 743 ng/L (ref <18)
Troponin I (High Sensitivity): >24000 ng/L (ref <18)
Troponin I (High Sensitivity): >24000 ng/L (ref <18)

## 2020-11-24 LAB — MRSA NEXT GEN BY PCR, NASAL: MRSA by PCR Next Gen: NOT DETECTED

## 2020-11-24 MED ORDER — THIAMINE HCL 100 MG/ML IJ SOLN
100.0000 mg | Freq: Every day | INTRAMUSCULAR | Status: DC
  Administered 2020-11-24 – 2020-11-25 (×2): 100 mg via INTRAVENOUS
  Filled 2020-11-24 (×2): qty 2

## 2020-11-24 MED ORDER — ORAL CARE MOUTH RINSE
15.0000 mL | OROMUCOSAL | Status: DC
  Administered 2020-11-24 – 2020-11-28 (×41): 15 mL via OROMUCOSAL

## 2020-11-24 MED ORDER — SODIUM CHLORIDE 0.9% FLUSH
10.0000 mL | Freq: Two times a day (BID) | INTRAVENOUS | Status: DC
Start: 2020-11-24 — End: 2020-11-28
  Administered 2020-11-24 – 2020-11-26 (×6): 10 mL
  Administered 2020-11-26 – 2020-11-27 (×2): 30 mL
  Administered 2020-11-27: 10 mL

## 2020-11-24 MED ORDER — CHLORHEXIDINE GLUCONATE 0.12% ORAL RINSE (MEDLINE KIT)
15.0000 mL | Freq: Two times a day (BID) | OROMUCOSAL | Status: DC
  Administered 2020-11-24 – 2020-11-28 (×11): 15 mL via OROMUCOSAL

## 2020-11-24 MED ORDER — ACETAMINOPHEN 160 MG/5ML PO SOLN
650.0000 mg | ORAL | Status: DC
  Administered 2020-11-24 – 2020-11-25 (×6): 650 mg
  Filled 2020-11-24 (×6): qty 20.3

## 2020-11-24 MED ORDER — AMLODIPINE BESYLATE 5 MG PO TABS
5.0000 mg | ORAL_TABLET | Freq: Every day | ORAL | Status: DC
  Administered 2020-11-24 – 2020-11-26 (×3): 5 mg
  Filled 2020-11-24 (×3): qty 1

## 2020-11-24 MED ORDER — ASPIRIN EC 81 MG PO TBEC: 81.0000 mg | DELAYED_RELEASE_TABLET | Freq: Every day | ORAL | Status: DC

## 2020-11-24 MED ORDER — SODIUM CHLORIDE 0.9% FLUSH: 10.0000 mL | INTRAVENOUS | Status: DC | PRN

## 2020-11-24 MED ORDER — HYDROCHLOROTHIAZIDE 10 MG/ML ORAL SUSPENSION: 25.0000 mg | Freq: Every day | ORAL | Status: DC

## 2020-11-24 MED ORDER — ACETAMINOPHEN 325 MG PO TABS
650.0000 mg | ORAL_TABLET | ORAL | Status: DC
  Administered 2020-11-25 (×2): 650 mg via ORAL
  Filled 2020-11-24 (×2): qty 2

## 2020-11-24 MED ORDER — VALPROIC ACID 250 MG/5ML PO SOLN
250.0000 mg | Freq: Two times a day (BID) | ORAL | Status: DC
  Administered 2020-11-24: 250 mg
  Filled 2020-11-24: qty 5

## 2020-11-24 MED ORDER — ACETAMINOPHEN 325 MG PO TABS: 650.0000 mg | ORAL_TABLET | ORAL | Status: DC

## 2020-11-24 MED ORDER — ACETAMINOPHEN 160 MG/5ML PO SOLN
650.0000 mg | ORAL | Status: DC
  Administered 2020-11-24: 650 mg
  Filled 2020-11-24: qty 20.3

## 2020-11-24 MED ORDER — ATORVASTATIN CALCIUM 40 MG PO TABS
40.0000 mg | ORAL_TABLET | Freq: Every day | ORAL | Status: DC
  Administered 2020-11-24 – 2020-11-27 (×4): 40 mg
  Filled 2020-11-24 (×4): qty 1

## 2020-11-24 MED ORDER — MAGNESIUM SULFATE 2 GM/50ML IV SOLN
2.0000 g | Freq: Once | INTRAVENOUS | Status: AC
  Administered 2020-11-24: 2 g via INTRAVENOUS
  Filled 2020-11-24: qty 50

## 2020-11-24 MED ORDER — ASPIRIN 81 MG PO CHEW
81.0000 mg | CHEWABLE_TABLET | Freq: Every day | ORAL | Status: DC
  Administered 2020-11-24 – 2020-11-27 (×4): 81 mg
  Filled 2020-11-24 (×4): qty 1

## 2020-11-24 MED ORDER — POLYETHYLENE GLYCOL 3350 17 G PO PACK
17.0000 g | PACK | Freq: Every day | ORAL | Status: DC
  Administered 2020-11-24 – 2020-11-27 (×4): 17 g
  Filled 2020-11-24 (×4): qty 1

## 2020-11-24 MED ORDER — ACETAMINOPHEN 650 MG RE SUPP: 650.0000 mg | RECTAL | Status: DC

## 2020-11-24 MED ORDER — HYDROCHLOROTHIAZIDE 25 MG PO TABS
25.0000 mg | ORAL_TABLET | Freq: Every day | ORAL | Status: DC
  Administered 2020-11-24: 25 mg
  Filled 2020-11-24: qty 1

## 2020-11-24 MED ORDER — POTASSIUM PHOSPHATES 15 MMOLE/5ML IV SOLN
30.0000 mmol | Freq: Once | INTRAVENOUS | Status: AC
  Administered 2020-11-24: 30 mmol via INTRAVENOUS
  Filled 2020-11-24: qty 10

## 2020-11-24 MED ORDER — ASPIRIN 81 MG PO CHEW
324.0000 mg | CHEWABLE_TABLET | Freq: Every day | ORAL | Status: DC
  Administered 2020-11-24: 324 mg
  Filled 2020-11-24: qty 4

## 2020-11-24 MED ORDER — DOCUSATE SODIUM 50 MG/5ML PO LIQD
100.0000 mg | Freq: Two times a day (BID) | ORAL | Status: DC
  Administered 2020-11-24 – 2020-11-27 (×8): 100 mg
  Filled 2020-11-24 (×8): qty 10

## 2020-11-24 MED ORDER — HEPARIN (PORCINE) 25000 UT/250ML-% IV SOLN
1350.0000 [IU]/h | INTRAVENOUS | Status: DC
  Administered 2020-11-24: 950 [IU]/h via INTRAVENOUS
  Administered 2020-11-26: 1100 [IU]/h via INTRAVENOUS
  Administered 2020-11-27: 1350 [IU]/h via INTRAVENOUS
  Filled 2020-11-24 (×4): qty 250

## 2020-11-24 MED ORDER — MAGNESIUM SULFATE 2 GM/50ML IV SOLN: 2.0000 g | Freq: Once | INTRAVENOUS | Status: DC

## 2020-11-24 MED ORDER — CHLORHEXIDINE GLUCONATE CLOTH 2 % EX PADS
6.0000 | MEDICATED_PAD | Freq: Every day | CUTANEOUS | Status: DC
  Administered 2020-11-24 – 2020-11-27 (×6): 6 via TOPICAL

## 2020-11-24 MED ORDER — POTASSIUM CHLORIDE 20 MEQ PO PACK
40.0000 meq | PACK | Freq: Once | ORAL | Status: AC
  Administered 2020-11-24: 40 meq
  Filled 2020-11-24: qty 2

## 2020-11-24 MED ORDER — AMLODIPINE 1 MG/ML ORAL SUSPENSION: 5.0000 mg | Freq: Every day | ORAL | Status: DC

## 2020-11-24 MED ORDER — CHLORHEXIDINE GLUCONATE CLOTH 2 % EX PADS
6.0000 | MEDICATED_PAD | Freq: Every day | CUTANEOUS | Status: DC
  Administered 2020-11-25 – 2020-11-27 (×2): 6 via TOPICAL

## 2020-11-24 NOTE — Progress Notes (Signed)
ANTICOAGULATION CONSULT NOTE  Pharmacy Consult for Heparin  Indication: chest pain/ACS, s/p cardiac arrest with ROSC  Allergies  Allergen Reactions   Benadryl [Diphenhydramine Hcl] Hypertension   Ribavirin Rash   Rozann Lesches [Ombitasvir-Paritaprevir-Ritonavir-Dasabuvir] Hives    Patient Measurements: Height: '5\' 6"'$  (167.6 cm) Weight: 91.4 kg (201 lb 8 oz) IBW/kg (Calculated) : 59.3 Vital Signs: Temp: 99 F (37.2 C) (10/09 2100) Temp Source: Bladder (10/09 1900) BP: 107/57 (10/09 1900) Pulse Rate: 70 (10/09 1900)  Labs: Recent Labs     0000 12/06/2020 2115 12/05/2020 2118 11/18/2020 2131 11/24/20 0025 11/24/20 0333 11/24/20 0343 11/24/20 0855 11/24/20 0953 11/24/20 1226 11/24/20 1237 11/24/20 2018 11/24/20 2033 11/24/20 2055  HGB  --   --  12.5   < >  --    < > 11.5*  --   --   --  10.5*  --  11.6* 9.5*  HCT  --   --  39.4   < >  --    < > 34.1*  --   --   --  31.0*  --  34.0* 28.0*  PLT  --   --  236  --   --   --  184  --   --   --   --   --   --   --   APTT  --  28  --   --   --   --   --   --   --   --   --   --   --   --   HEPARINUNFRC  --   --   --   --   --   --   --   --  0.17*  --   --  0.37  --   --   CREATININE   < >  --  1.71*  --   --   --  1.49*  --   --  1.50*  --  1.55*  --   --   TROPONINIHS   < >  --  743*  --  1,799*  --   --  >24,000*  --  >24,000*  --   --   --   --    < > = values in this interval not displayed.     Estimated Creatinine Clearance: 40.1 mL/min (A) (by C-G formula based on SCr of 1.55 mg/dL (H)).   Medical History: Past Medical History:  Diagnosis Date   Anxiety    Bronchitis    Depression    Excessive cerumen in both ear canals 07/02/2017   H/O drug abuse ([place])    IV drugs, drug free 12 years   Hepatitis    Hep. C   History of suicidal ideation    thought about overdose in the past   HIV infection ([place])    Hypertension    Neuropathy    Sleep difficulties    Trichomonal infection    2009    Assessment: 65 y/o F  s/p out of hospital cardiac arrest with ROSC. She is on  heparin per pharmacy for r/o ACS.  -heparin level at goal on 1100 units/hr  Goal of Therapy:  Heparin level 0.3-0.7 units/ml Monitor platelets by anticoagulation protocol: Yes   Plan:  -Continue heparin at 1100 units/hr -Daily heparin level and CBC  Hildred Laser, PharmD Clinical Pharmacist **Pharmacist phone directory can now be found on [email or web address] ([email or web address]).  Listed under [place].

## 2020-11-24 NOTE — Progress Notes (Signed)
  Echocardiogram 2D Echocardiogram has been performed.  Sharon Norris 11/24/2020, 1:56 PM

## 2020-11-24 NOTE — Progress Notes (Signed)
NAME:  Sharon Norris, MRN:  [id number], DOB:  1956-02-10, LOS: 1 ADMISSION DATE:  12/16/2020, CONSULTATION DATE:  12/14/2020 REFERRING MD:  Little Ishikawa, MD, CHIEF COMPLAINT:  PEA cardiac arrest   History of Present Illness:  [name] is a 65 year old lady with PMH significant for IVDA, hepatitis C, hypertension among other conditions.  It was reported that the patient had a witnessed cardiac arrest at home.  There were no bystander CPR. EMS arrived and found patient in PEA. POSC was archived after a total of 20 minutes of ACLS protocol.  Patient had transient ROSC at 10 minutes but lost pulses but regained it after a second 10 minutes of ACLS. Protocol.  She was intubated in the ED as she arrived with a [facility] airway.  As per the patient's grand daughter, the patient had complained of chest pain intermittently the day prior to presentation.    Pertinent  Medical History  IV cocaine abuse HIV disease hypertension  Significant Hospital Events: Including procedures, antibiotic start and stop dates in addition to other pertinent events    11/24/2020 Intubation; right femoral TLC; Left radial arterial line   Objective   Blood pressure 125/64, pulse 71, temperature 98.2 F (36.8 C), temperature source Bladder, resp. rate (!) 24, height '5\' 6"'$  (1.676 m), weight 91.4 kg, SpO2 97 %.    Vent Mode: PRVC FiO2 (%):  [40 %-100 %] 40 % Set Rate:  [16 bmp-24 bmp] 24 bmp Vt Set:  [470 mL] 470 mL PEEP:  [5 cmH20] 5 cmH20 Plateau Pressure:  [14 cmH20-28 cmH20] 21 cmH20   Intake/Output Summary (Last 24 hours) at 11/24/2020 0836 Last data filed at 11/24/2020 0800 Gross per 24 hour  Intake 899.8 ml  Output 715 ml  Net 184.8 ml   Filed Weights   12/07/2020 2029 11/24/20 0329  Weight: 100 kg 91.4 kg    Examination: General: obese fm, intubated on life support  HENT: NCAT, pupils pinpoint  Lungs: CTAB, BL vented breaths  Cardiovascular: RRR, s1 s2 , tachy  Abdomen: obese, soft nt nd   Extremities: no edema  Neuro: no response to pain of stimuli, ?hiccups GU: foley      Assessment & Plan:   Cardiac Arrest PEA Suspect cocaine related  Plan: Normothermia protocol  EEG ordered  Family discussions this morning   Acute NSTEMI vs supply demand mismatch  Possibly cocaine related/induced  Plan:  Heparin infusion  ASA '81mg'$  daily  Started atrovastatin  Holding BB(-)  Check lipids Trend troponin  ECHO pending   Malignant hypertension - CT head negative Plan:  Continue nicardipine infusion  Start oral amlodipine + HCTZ Wean from nicardipine   Acute hypoxic and hypercarbic respiratory failure secondary  P: Full vent support  Wean as tolerated  Spo2 goal >90%  SAT SBT when able   Acute kidney injury superimposed on CKD 3b Plan:  Follow UOP Avoid nephrotoxic agents  Avoid ace and arb in setting of arrest for bp control   Acute metabolic encephalopathy  Hx of substance abuse with positive toxicology screen for cocaine. P; Observe for withdrawal symptoms  Unclear if alcohol plays an role in here  PAD guidelines for sedation needs   Lactic Acidosis  - s/p arrest P: Recheck and follow   Leukocytosis P: Post arrest  Suspect reactive  At risk for aspiration   Hx HIV disease - Hx of hepatitis C P: OP follow up    Right femoral CVC - still needs  Foley  -  still needs    Best Practice (right click and "Reselect all SmartList Selections" daily)   Diet/type: NPO DVT prophylaxis: systemic heparin GI prophylaxis: PPI Lines: Central line, Arterial line Foley:  Yes, and it is still needed Code Status:  full code Last date of multidisciplinary goals of care discussion   Labs   CBC: Recent Labs  Lab 11/22/2020 2118 11/27/2020 2131 11/24/20 0333 11/24/20 0343  WBC 18.6*  --   --  13.1*  NEUTROABS 16.0*  --   --   --   HGB 12.5 12.6 11.2* 11.5*  HCT 39.4 37.0 33.0* 34.1*  MCV 101.8*  --   --  96.3  PLT 236  --   --  184    Basic  Metabolic Panel: Recent Labs  Lab 11/29/2020 2118 11/30/2020 2131 11/24/20 0025 11/24/20 0333 11/24/20 0343  NA 138 141  --  143 137  K 5.5* 4.9  --  3.7 3.6  CL 112*  --   --   --  113*  CO2 18*  --   --   --  15*  GLUCOSE 255*  --   --   --  156*  BUN 25*  --   --   --  27*  CREATININE 1.71*  --   --   --  1.49*  CALCIUM 7.5*  --   --   --  7.7*  MG  --   --  1.7  --  3.0*  PHOS  --   --   --   --  1.7*   GFR: Estimated Creatinine Clearance: 41.7 mL/min (A) (by C-G formula based on SCr of 1.49 mg/dL (H)). Recent Labs  Lab 12/01/2020 2118 11/24/20 0025 11/24/20 0343  WBC 18.6*  --  13.1*  LATICACIDVEN 3.4* 1.6  --     Liver Function Tests: Recent Labs  Lab 12/15/2020 2118 11/24/20 0343  AST 43* 66*  ALT 19 27  ALKPHOS 77 67  BILITOT 0.7 0.6  PROT 6.6 6.4*  ALBUMIN 2.6* 2.6*   No results for input(s): LIPASE, AMYLASE in the last 168 hours. No results for input(s): AMMONIA in the last 168 hours.  ABG    Component Value Date/Time   PHART 7.411 11/24/2020 0333   PCO2ART 23.9 (L) 11/24/2020 0333   PO2ART 64 (L) 11/24/2020 0333   HCO3 15.4 (L) 11/24/2020 0333   TCO2 16 (L) 11/24/2020 0333   ACIDBASEDEF 8.0 (H) 11/24/2020 0333   O2SAT 94.0 11/24/2020 0333     Coagulation Profile: No results for input(s): INR, PROTIME in the last 168 hours.  Cardiac Enzymes: No results for input(s): CKTOTAL, CKMB, CKMBINDEX, TROPONINI in the last 168 hours.  HbA1C: Hgb A1c MFr Bld  Date/Time Value Ref Range Status  11/18/2020 09:18 PM 6.4 (H) 4.8 - 5.6 % Final    Comment:    (NOTE) Pre diabetes:          5.7%-6.4%  Diabetes:              >6.4%  Glycemic control for   <7.0% adults with diabetes   11/21/2019 06:04 AM 6.2 (H) 4.8 - 5.6 % Final    Comment:    (NOTE) Pre diabetes:          5.7%-6.4%  Diabetes:              >6.4%  Glycemic control for   <7.0% adults with diabetes     CBG: Recent Labs  Lab 11/24/20 0012 11/24/20  [id number] 11/24/20 0735  GLUCAP 165*  154* 110*    Review of Systems:   Unable to obtain as patient on vent.  Past Medical History:  She,  has a past medical history of Anxiety, Bronchitis, Depression, Excessive cerumen in both ear canals (07/02/2017), H/O drug abuse ([place]), Hepatitis, History of suicidal ideation, HIV infection ([place]), Hypertension, Neuropathy, Sleep difficulties, and Trichomonal infection.   Surgical History:   Past Surgical History:  Procedure Laterality Date   COLPOSCOPY  2010     Social History:   reports that she quit smoking about 20 years ago. She has never used smokeless tobacco. She reports current alcohol use. She reports that she does not use drugs.   Family History:  Her family history includes Alcohol abuse in her father; Cancer in her maternal aunt, sister, and sister.   Allergies Allergies  Allergen Reactions   Benadryl [Diphenhydramine Hcl] Hypertension   Ribavirin Rash   Viekira Pak [Ombitasvir-Paritaprevir-Ritonavir-Dasabuvir] Rash    Hives like reaction     Home Medications  Prior to Admission medications   Medication Sig Start Date End Date Taking? Authorizing Provider  amLODipine (NORVASC) 10 MG tablet TAKE ONE TABLET BY MOUTH DAILY 11/08/20   Mitzi Hansen, MD  ASPIR-LOW 81 MG EC tablet TAKE ONE TABLET BY MOUTH DAILY . SWALLOW WHOLE 11/08/20   Mitzi Hansen, MD  atorvastatin (LIPITOR) 80 MG tablet TAKE ONE TABLET BY MOUTH DAILY 11/08/20   Mitzi Hansen, MD  carvedilol (COREG) 25 MG tablet TAKE ONE TABLET BY MOUTH TWICE A DAY WITH MEALS 11/08/20   Darrick Meigs, Rylee, MD  divalproex (DEPAKOTE ER) 500 MG 24 hr tablet TAKE ONE TABLET BY MOUTH DAILY 11/08/20   Mitzi Hansen, MD  gabapentin (NEURONTIN) 300 MG capsule TAKE TWO CAPSULES BY MOUTH TWICE A DAY 10/04/20   Madalyn Rob, MD  isosorbide mononitrate (IMDUR) 60 MG 24 hr tablet TAKE ONE TABLET BY MOUTH DAILY 11/08/20   Mitzi Hansen, MD  Multiple Vitamin (MULTIVITAMIN WITH MINERALS) TABS tablet Take 1 tablet by mouth  daily.    [provider]  nicotine (NICODERM CQ - DOSED IN MG/24 HOURS) 14 mg/24hr patch Place 1 patch (14 mg total) onto the skin daily. 11/30/19   Mercy Riding, MD  ramelteon (ROZEREM) 8 MG tablet TAKE ONE TABLET BY MOUTH AT BEDTIME AS NEEDED FOR SLEEP 07/02/20   Gaylan Gerold, DO  TRIUMEQ 600-50-300 MG tablet TAKE ONE TABLET BY MOUTH DAILY 10/04/20   Madalyn Rob, MD     This patient is critically ill with multiple organ system failure; which, requires frequent high complexity decision making, assessment, support, evaluation, and titration of therapies. This was completed through the application of advanced monitoring technologies and extensive interpretation of multiple databases. During this encounter critical care time was devoted to patient care services described in this note for 85 minutes.  Garner Nash, DO [facility] 11/24/2020 8:36 AM

## 2020-11-24 NOTE — Consult Note (Signed)
Cardiology Consultation:   Patient ID: Sharon Norris MRN: [id number]; DOB: [birth year]  Admit date: 12/09/2020 Date of Consult: 11/24/2020  Primary Care Provider: Scarlett Presto, MD [facility] Cardiologist: None  [facility] Electrophysiologist:  None   Patient Profile:   Sharon Norris is a 65 y.o. female with prior IVDU, MDD/suicidal ideation, prior CVA, HTN, HIV, and neuropathy who presents s/p PEA arrest and for which cardiology is consulted for abnormal imaging.   History of Present Illness:   Sharon Norris was at home when her friend reportedly witnessed her collapse and EMS arrived where she was PEA and she was given epi x3 with CPR for 20 minutes before ROSC.  On arrival to the ED she was sinus rhythm in the 70s and HTN on epi gtt. started by EMS.  She was nonresponsive to any painful stimuli and her pupils were 3 mm and reactive bilaterally with a GCS of 3.  Airway was placed on arrival.  Lab work significant for mild leukocytosis (WBC 18.6), Hb (12.5), sCr (1.7), lactic acidosis (3.4), mild hyperkalemia (K 5.5).   VS in ED: HR 78, BP 165/89, RR 16, SpO2 100%, T 96.42F  U tox positive for benzo and cocaine, etoh negative.   hsT 269 (2118)->1799 (0025). Lactic acidosis resolved (1.6 at 0025).   She was evaluated by cardiology in 11/2019 for CP and markedly elevated trops (hsT peak 4942) with normal EF and no WMA on TTE. It was recommended she have noninvasive evaluation 2/2 c/f etoh withdrawal and inability to cooperate 2/2 agitation. MRI c/f subacute cerebellar stroke during that hospitalization. Her mental status eventually resolved and she was tx medically for NSTEMI and ultimately d/c home.   She was evaluated in the ED in 05/2020 for L arm pain, SOB and CP of 4 days duration along with difficulty speaking for 2 hours. She left AMA during that evaluation. Her hsT was elevated but not as high as prior (396 that time). Cocaine positive again during her evaluation.   Presented  06/21/20 with recurrent CP with L arm radiation. Slight relief with SLNG. CTA C/A/P stable and negative for dissection. Her sx improved and she left again AMA.   Spoke to her granddaughter at bedside he was able to get in touch with friend who saw her before she collapsed.  Her friend said that she walked from her house to the friend's house which is around 5 minutes and on arrival said she was extremely short of breath.  She sat down in a chair and within minutes she became unresponsive, clenched her jaw, and her eyes rolled back.  She was still having agonal breathing at which point her friend called 911 and was instructed to move her to the ground and start bystander CPR.  Her friend did so for several minutes until EMS arrival where they took over CPR for PEA arrest.  Her friend thinks that she had "shallow breathing" for 5-10 minutes before she moved her to the floor and started CPR but was not certain about the timing.  Her friend said that 2 days ago she did have some episodes of chest pain however the main symptom she was complaining of today was SOB.  Past Medical History:  Diagnosis Date   Anxiety    Bronchitis    Depression    Excessive cerumen in both ear canals 07/02/2017   H/O drug abuse ([place])    IV drugs, drug free 12 years   Hepatitis    Hep. C  History of suicidal ideation    thought about overdose in the past   HIV infection ([place])    Hypertension    Neuropathy    Sleep difficulties    Trichomonal infection    2009    Past Surgical History:  Procedure Laterality Date   COLPOSCOPY  2010     Home Medications:  Prior to Admission medications   Medication Sig Start Date End Date Taking? Authorizing Provider  amLODipine (NORVASC) 10 MG tablet TAKE ONE TABLET BY MOUTH DAILY 11/08/20   Mitzi Hansen, MD  ASPIR-LOW 81 MG EC tablet TAKE ONE TABLET BY MOUTH DAILY . SWALLOW WHOLE 11/08/20   Mitzi Hansen, MD  atorvastatin (LIPITOR) 80 MG tablet TAKE ONE TABLET BY MOUTH  DAILY 11/08/20   Mitzi Hansen, MD  carvedilol (COREG) 25 MG tablet TAKE ONE TABLET BY MOUTH TWICE A DAY WITH MEALS 11/08/20   Darrick Meigs, Rylee, MD  divalproex (DEPAKOTE ER) 500 MG 24 hr tablet TAKE ONE TABLET BY MOUTH DAILY 11/08/20   Mitzi Hansen, MD  gabapentin (NEURONTIN) 300 MG capsule TAKE TWO CAPSULES BY MOUTH TWICE A DAY 10/04/20   Madalyn Rob, MD  isosorbide mononitrate (IMDUR) 60 MG 24 hr tablet TAKE ONE TABLET BY MOUTH DAILY 11/08/20   Mitzi Hansen, MD  Multiple Vitamin (MULTIVITAMIN WITH MINERALS) TABS tablet Take 1 tablet by mouth daily.    [provider]  nicotine (NICODERM CQ - DOSED IN MG/24 HOURS) 14 mg/24hr patch Place 1 patch (14 mg total) onto the skin daily. 11/30/19   Mercy Riding, MD  ramelteon (ROZEREM) 8 MG tablet TAKE ONE TABLET BY MOUTH AT BEDTIME AS NEEDED FOR SLEEP 07/02/20   Gaylan Gerold, DO  TRIUMEQ 600-50-300 MG tablet TAKE ONE TABLET BY MOUTH DAILY 10/04/20   Madalyn Rob, MD    Inpatient Medications: Scheduled Meds:  acetaminophen  650 mg Oral Q4H   Or   acetaminophen (TYLENOL) oral liquid 160 mg/5 mL  650 mg Per Tube Q4H   Or   acetaminophen  650 mg Rectal Q4H   chlorhexidine gluconate (MEDLINE KIT)  15 mL Mouth Rinse BID   Chlorhexidine Gluconate Cloth  6 each Topical Q0600   Chlorhexidine Gluconate Cloth  6 each Topical Daily   fentaNYL (SUBLIMAZE) injection  100 mcg Intravenous Once   hydrALAZINE       insulin aspart  0-15 Units Subcutaneous Q4H   mouth rinse  15 mL Mouth Rinse 10 times per day   pantoprazole (PROTONIX) IV  40 mg Intravenous QHS   sodium chloride flush  10-40 mL Intracatheter Q12H   valproic acid  250 mg Per Tube BID   Continuous Infusions:  sodium chloride     fentaNYL infusion INTRAVENOUS 100 mcg/hr (11/24/20 0049)   heparin     niCARDipine 7.5 mg/hr (11/24/20 0048)   propofol (DIPRIVAN) infusion 25 mcg/kg/min (11/24/20 0048)   PRN Meds: Place/Maintain arterial line **AND** sodium chloride, docusate,  fentaNYL, polyethylene glycol, sodium chloride flush  Allergies:    Allergies  Allergen Reactions   Benadryl [Diphenhydramine Hcl] Hypertension   Ribavirin Rash   Viekira Pak [Ombitasvir-Paritaprevir-Ritonavir-Dasabuvir] Rash    Hives like reaction   Social History:   Social History   Socioeconomic History   Marital status: Single    Spouse name: Not on file   Number of children: Not on file   Years of education: Not on file   Highest education level: Not on file  Occupational History   Not on file  Tobacco Use  Smoking status: Former    Types: Cigarettes    Quit date: 02/17/2000    Years since quitting: 20.7   Smokeless tobacco: Never  Vaping Use   Vaping Use: Never used  Substance and Sexual Activity   Alcohol use: Yes    Alcohol/week: 0.0 standard drinks    Comment: beer, occasional   Drug use: No   Sexual activity: Not on file  Other Topics Concern   Not on file  Social History Narrative   Not on file   Social Determinants of Health   Financial Resource Strain: Not on file  Food Insecurity: Not on file  Transportation Needs: Not on file  Physical Activity: Not on file  Stress: Not on file  Social Connections: Not on file  Intimate Partner Violence: Not on file    Family History:    Family History  Problem Relation Age of Onset   Alcohol abuse Father    Cancer Maternal Aunt        endometrial   Cancer Sister        endometrial   Cancer Sister        breast    ROS:  Review of Systems: [y] = yes, [ ] = no      General: Weight gain [ ]; Weight loss [ ]; Anorexia [ ]; Fatigue [ ]; Fever [ ]; Chills [ ]; Weakness [ ]   Cardiac: Chest pain/pressure [ ]; Resting SOB [y per report]; Exertional SOB [ ]; Orthopnea [ ]; Pedal Edema [ ]; Palpitations [ ]; Syncope [ ]; Presyncope [ ]; Paroxysmal nocturnal dyspnea [ ]   Pulmonary: Cough [ ]; Wheezing [ ]; Hemoptysis [ ]; Sputum [ ]; Snoring [ ]   GI: Vomiting [ ]; Dysphagia [ ]; Melena [ ]; Hematochezia [ ];  Heartburn [ ]; Abdominal pain [ ]; Constipation [ ]; Diarrhea [ ]; BRBPR [ ]   GU: Hematuria [ ]; Dysuria [ ]; Nocturia [ ] Vascular: Pain in legs with walking [ ]; Pain in feet with lying flat [ ]; Non-healing sores [ ]; Stroke [ ]; TIA [ ]; Slurred speech [ ];   Neuro: Headaches [ ]; Vertigo [ ]; Seizures [ ]; Paresthesias [ ];Blurred vision [ ]; Diplopia [ ]; Vision changes [ ]   Ortho/Skin: Arthritis [ ]; Joint pain [ ]; Muscle pain [ ]; Joint swelling [ ]; Back Pain [ ]; Rash [ ]   Psych: Depression [ ]; Anxiety [ ]   Heme: Bleeding problems [ ]; Clotting disorders [ ]; Anemia [ ]   Endocrine: Diabetes [ ]; Thyroid dysfunction [ ]   Physical Exam/Data:   Vitals:   12/10/2020 2325 12/07/2020 2330 12/11/2020 2335 11/24/20 0100  BP:  (!) 267/130 (!) 161/73 112/70  Pulse:  87 93   Resp:  (!) 25 (!) 27 (!) 24  Temp:      TempSrc:      SpO2: 97% 99% 98%   Weight:      Height:        Intake/Output Summary (Last 24 hours) at 11/24/2020 0130 Last data filed at 11/24/2020 0129 Gross per 24 hour  Intake 10 ml  Output --  Net 10 ml   Last 3 Weights 12/04/2020 06/13/2020 05/22/2020  Weight (lbs) 220 lb 7.4 oz 192 lb 190 lb 3.2 oz  Weight (kg) 100 kg 87.091 kg 86.274 kg     Body mass index is 35.58 kg/m.  General:  intubated/sedated HEENT:  pupils 1 mm and minimally reactive  Lymph: no adenopathy Neck: no JVD Endocrine:  No thryomegaly Vascular: No carotid bruits; FA pulses 2+ bilaterally without bruits  Cardiac:  normal S1, S2; RRR; no murmur  Lungs:  clear to auscultation bilaterally, no wheezing, rhonchi or rales  Abd: soft Ext: no edema Musculoskeletal: n/a, intubated/sedated Skin: warm and dry  Neuro:  intubated/sedated  EKG:  The EKG was personally reviewed and demonstrates: NSR, lateral TWI similar to prior  Telemetry:  Telemetry was personally reviewed and demonstrates: no arrhythmias, sinus rhythm    Relevant CV Studies:  TTE Result date: 11/21/19  1. Mild intracavitary  gradient. Peak velocity 1.78 m/s. Peak gradient  12.6 mmHg. Left ventricular ejection fraction, by estimation, is 60 to  65%. The left ventricle has normal function. The left ventricle has no  regional wall motion abnormalities. There   is mild concentric left ventricular hypertrophy. Left ventricular  diastolic parameters are consistent with Grade I diastolic dysfunction  (impaired relaxation). Elevated left ventricular end-diastolic pressure.   2. Right ventricular systolic function is normal. The right ventricular  size is normal. There is normal pulmonary artery systolic pressure.   3. The mitral valve is normal in structure. No evidence of mitral valve  regurgitation. No evidence of mitral stenosis.   4. The aortic valve is normal in structure. Aortic valve regurgitation is  not visualized. No aortic stenosis is present.   5. The inferior vena cava is normal in size with <50% respiratory  variability, suggesting right atrial pressure of 8 mmHg.   Laboratory Data:  High Sensitivity Troponin:   Recent Labs  Lab 12/06/2020 2118 11/24/20 0025  TROPONINIHS 743* 1,799*     Chemistry Recent Labs  Lab 11/17/2020 2118 12/01/2020 2131  NA 138 141  K 5.5* 4.9  CL 112*  --   CO2 18*  --   GLUCOSE 255*  --   BUN 25*  --   CREATININE 1.71*  --   CALCIUM 7.5*  --   GFRNONAA 32*  --   ANIONGAP 8  --     Recent Labs  Lab 12/07/2020 2118  PROT 6.6  ALBUMIN 2.6*  AST 43*  ALT 19  ALKPHOS 77  BILITOT 0.7   Hematology Recent Labs  Lab 12/15/2020 2118 12/16/2020 2131  WBC 18.6*  --   RBC 3.87  --   HGB 12.5 12.6  HCT 39.4 37.0  MCV 101.8*  --   MCH 32.3  --   MCHC 31.7  --   RDW 13.2  --   PLT 236  --    BNPNo results for input(s): BNP, PROBNP in the last 168 hours.  DDimer No results for input(s): DDIMER in the last 168 hours.  Radiology/Studies:  CT HEAD WO CONTRAST (5MM)  Result Date: 11/24/2020 CLINICAL DATA:  Altered mental status EXAM: CT HEAD WITHOUT CONTRAST  TECHNIQUE: Contiguous axial images were obtained from the base of the skull through the vertex without intravenous contrast. COMPARISON:  11/21/2019 FINDINGS: Brain: Normal anatomic configuration. Parenchymal volume loss is commensurate with the patient's age. Remote lacunar infarct noted within the left basal ganglia, unchanged. No abnormal intra or extra-axial mass lesion or fluid collection. No abnormal mass effect or midline shift. No evidence of acute intracranial hemorrhage or infarct. Ventricular size is normal. Cerebellum unremarkable. Vascular: No asymmetric hyperdense vasculature at the skull base. Skull: Intact Sinuses/Orbits: There is dense opacification of the right maxillary sinus, new since prior examination. Remaining paranasal sinuses are clear.  Orbits are unremarkable. Other: Mastoid air cells and middle ear cavities are clear. IMPRESSION: No acute intracranial abnormality. Remote lacunar infarct as described above. Right maxillary sinus disease, new since prior examination. Electronically Signed   By: Fidela Salisbury M.D.   On: 11/20/2020 23:28   CT Angio Chest PE W and/or Wo Contrast  Result Date: 12/13/2020 CLINICAL DATA:  PE suspected, high prob. Dyspnea, altered mental status EXAM: CT ANGIOGRAPHY CHEST WITH CONTRAST TECHNIQUE: Multidetector CT imaging of the chest was performed using the standard protocol during bolus administration of intravenous contrast. Multiplanar CT image reconstructions and MIPs were obtained to evaluate the vascular anatomy. CONTRAST:  17m OMNIPAQUE IOHEXOL 350 MG/ML SOLN COMPARISON:  06/21/2020 FINDINGS: Cardiovascular: There is suboptimal opacification of the pulmonary arterial tree with preferential opacification of the aorta. The examination is adequate only for exclusion of a central pulmonary embolism involving the main, right and left, and lobar pulmonary arteries of which there is none. The segmental and subsegmental pulmonary arteries are not adequately  opacified to definitively exclude the presence of small pulmonary emboli. The central pulmonary arteries are of normal caliber. There is hypoenhancement of the left ventricular apex and mid cavity and apical septum which may reflect changes of an acute myocardial infarction. This appears new since prior examination. Cardiac size within normal limits. No pericardial effusion. Mild atherosclerotic calcification within the thoracic aorta. No aortic aneurysm. Mediastinum/Nodes: No pathologic thoracic adenopathy. Nasogastric tube extends into the mid body of the stomach beyond the margin of the examination. Endotracheal tube is seen 2.5 cm above the carina. Lungs/Pleura: There is peribronchovascular pulmonary infiltrate and mild smooth septal thickening as well as scattered ground-glass pulmonary infiltrate all most in keeping with mild pulmonary edema, possibly cardiogenic in nature. Mild bibasilar atelectasis. No pneumothorax. Trace bilateral pleural effusions are present. Upper Abdomen: Cholelithiasis without pericholecystic inflammatory change. No acute abnormality. Musculoskeletal: No acute bone abnormality. No lytic or blastic bone lesion. Review of the MIP images confirms the above findings. IMPRESSION: Technically limited examination without evidence of pulmonary embolism through the lobar pulmonary arterial tree. The segmental and subsegmental pulmonary arteries are not adequately opacified to exclude the presence of small pulmonary emboli. Hypoenhancement of the left ventricular apex and mid cavity and apical septal walls suspicious for myocardial ischemia and/or infarction. Correlation with cardiac enzymes and EKG examination is recommended for further evaluation. Mild peribronchovascular and interstitial pulmonary infiltrate most in keeping with mild pulmonary edema, likely cardiogenic in nature. Trace bilateral pleural effusions. Support tubes in appropriate position. Aortic Atherosclerosis (ICD10-I70.0).  These results were called by telephone at the time of interpretation on 12/13/2020 at 11:21 pm to provider [facility], who verbally acknowledged these results. Electronically Signed   By: AFidela SalisburyM.D.   On: 12/04/2020 23:23   DG Chest Portable 1 View  Result Date: 11/17/2020 CLINICAL DATA:  Intubated EXAM: PORTABLE CHEST 1 VIEW COMPARISON:  06/21/2020 FINDINGS: Single frontal view of the chest demonstrates endotracheal tube overlying tracheal air column tip midway between thoracic inlet and carina. Enteric catheter passes below diaphragm, coiled over the gastric fundus. Tip of catheter is excluded by collimation. Cardiac silhouette is stable. There is increased central vascular congestion with bilateral perihilar airspace disease greatest in the upper lobes. No effusion or pneumothorax. No acute bony abnormalities. IMPRESSION: 1. No complication after intubation. 2. Increased vascular congestion with bilateral perihilar airspace disease most consistent with edema. Electronically Signed   By: MRanda NgoM.D.   On: 11/27/2020 21:36    Assessment  and Plan:   PEA arrest  Patient with multiple risk factors for CAD and recent onset worsening of possible angina.  She has had frequent evaluations in the ED for which she usually leaves AMA before work-up is complete.  Her polysubstance abuse unfortunately has complicated her medical care.  She was evaluated in the hospital last year for similar symptoms but her altered mental status prevented additional work-up and she was treated likely without coronary evaluation.  Cardiology was consulted for hypoenhancement of the LV apex and mid cavity and apical septum on CT PE scan.  ECG without dynamic changes and with lateral T wave inversions similar to prior.  In the absence of STEMI there is no data to support urgent coronary evaluation.  Given that her GCS was 3 on arrival and she is undergoing TTM I think we can wait for additional work-up given her  extremely guarded prognosis.  TTE is already ordered.  She has been ordered for aspirin load and heparin drip.  Would recommend daily aspirin for continued NSTEMI treatment.  It is unclear whether her hypoperfusion on CT PE and troponin elevation is just in the setting of post PEA arrest or whether she had an NSTEMI leading to her presentation.  Given that she had chest pain several days leading up to these symptoms I think it is reasonable to at least treat her for NSTEMI for the 48 hours while we wait for neuro assessment post TTM.    For questions or updates, please contact [place] Please consult [email or web address] for contact info under   Signed, Dion Body, MD  11/24/2020 1:30 AM

## 2020-11-24 NOTE — Progress Notes (Signed)
[facility] ADULT ICU REPLACEMENT PROTOCOL   The patient does apply for the [facility] Adult ICU Electrolyte Replacment Protocol based on the criteria listed below:   1.Exclusion criteria: TCTS patients, ECMO patients and Hypothermia Protocol, and   Dialysis patients 2. Is GFR >/= 30 ml/min? Yes.    Patient's GFR today is 36 3. Is SCr </= 2? Yes.   Patient's SCr is 1.55 mg/dL 4. Did SCr increase >/= 0.5 in 24 hours? No. 5.Pt's weight >40kg  Yes.   6. Abnormal electrolyte(s): K 3.4  7. Electrolytes replaced per protocol 8.  Call MD STAT for K+ </= 2.5, Phos </= 1, or Mag </= 1 Physician:    Ronda Fairly A 11/24/2020 9:22 PM

## 2020-11-24 NOTE — Progress Notes (Signed)
ANTICOAGULATION CONSULT NOTE - Initial Consult  Pharmacy Consult for Heparin  Indication: chest pain/ACS, s/p cardiac arrest with ROSC  Allergies  Allergen Reactions   Benadryl [Diphenhydramine Hcl] Hypertension   Ribavirin Rash   Viekira Pak [Ombitasvir-Paritaprevir-Ritonavir-Dasabuvir] Rash    Hives like reaction    Patient Measurements: Height: '5\' 6"'$  (167.6 cm) Weight: 100 kg (220 lb 7.4 oz) IBW/kg (Calculated) : 59.3 Vital Signs: Temp: 96.3 F (35.7 C) (10/08 2149) Temp Source: Temporal (10/08 2149) BP: 112/70 (10/09 0100) Pulse Rate: 93 (10/08 2335)  Labs: Recent Labs    11/20/2020 2115 11/20/2020 2118 12/16/2020 2131  HGB  --  12.5 12.6  HCT  --  39.4 37.0  PLT  --  236  --   APTT 28  --   --   CREATININE  --  1.71*  --   TROPONINIHS  --  743*  --     Estimated Creatinine Clearance: 38.1 mL/min (A) (by C-G formula based on SCr of 1.71 mg/dL (H)).   Medical History: Past Medical History:  Diagnosis Date   Anxiety    Bronchitis    Depression    Excessive cerumen in both ear canals 07/02/2017   H/O drug abuse ([place])    IV drugs, drug free 12 years   Hepatitis    Hep. C   History of suicidal ideation    thought about overdose in the past   HIV infection ([place])    Hypertension    Neuropathy    Sleep difficulties    Trichomonal infection    2009    Assessment: 65 y/o F s/p out of hospital cardiac arrest with ROSC. Starting heparin per pharmacy. CBC ok, noted renal dysfunction. PTA meds reviewed.   Goal of Therapy:  Heparin level 0.3-0.7 units/ml Monitor platelets by anticoagulation protocol: Yes   Plan:  Will defer bolus with at least 20 minutes of CPR received Start heparin drip at 950 units/hr 1000 heparin level Monitor for bleeding F/U cardiology consult  Narda Bonds, PharmD, [place] Pharmacist Phone: [phone]

## 2020-11-24 NOTE — Progress Notes (Signed)
[facility] ADULT ICU REPLACEMENT PROTOCOL   The patient does apply for the [facility] Adult ICU Electrolyte Replacment Protocol based on the criteria listed below:   1.Exclusion criteria: TCTS patients, ECMO patients and Hypothermia Protocol, and   Dialysis patients 2. Is GFR >/= 30 ml/min? Yes.    Patient's GFR today is 32 3. Is SCr </= 2? Yes.   Patient's SCr is 1.7 mg/dL 4. Did SCr increase >/= 0.5 in 24 hours? No. 5.Pt's weight >40kg  Yes.   6. Abnormal electrolyte(s): mag 1.7  7. Electrolytes replaced per protocol 8.  Call MD STAT for K+ </= 2.5, Phos </= 1, or Mag </= 1 Physician:    Ronda Fairly A 11/24/2020 2:57 AM

## 2020-11-24 NOTE — Progress Notes (Signed)
RT note: RT and RN  transported vent patient ED to 2H01. Vitals signs stable through out.

## 2020-11-24 NOTE — Plan of Care (Signed)
PCCM Goals of Care Discussion and Advanced Care Planning:   Date: 11/24/2020   Present Parties: daughters, grand daughters  What was discussed:  Current medical condition. What to expect post-arrest. Current plan for EEG and supportive care.   Outcome: remains full code. Continue current management   18 mins of time was spent discussing the goals of care, advanced care planning options such as code status as well as do not resuscitate forms when necessary.

## 2020-11-24 NOTE — Progress Notes (Signed)
ANTICOAGULATION CONSULT NOTE  Pharmacy Consult for Heparin  Indication: chest pain/ACS, s/p cardiac arrest with ROSC  Allergies  Allergen Reactions   Benadryl [Diphenhydramine Hcl] Hypertension   Ribavirin Rash   Viekira Pak [Ombitasvir-Paritaprevir-Ritonavir-Dasabuvir] Rash    Hives like reaction    Patient Measurements: Height: '5\' 6"'$  (167.6 cm) Weight: 91.4 kg (201 lb 8 oz) IBW/kg (Calculated) : 59.3 Vital Signs: Temp: 99.5 F (37.5 C) (10/09 1330) Temp Source: Bladder (10/09 1330) BP: 105/55 (10/09 1330) Pulse Rate: 70 (10/09 1330)  Labs: Recent Labs    12/05/2020 2115 12/09/2020 2118 12/07/2020 2131 11/24/20 0025 11/24/20 0333 11/24/20 0343 11/24/20 0855 11/24/20 0953 11/24/20 1226 11/24/20 1237  HGB  --  12.5   < >  --  11.2* 11.5*  --   --   --  10.5*  HCT  --  39.4   < >  --  33.0* 34.1*  --   --   --  31.0*  PLT  --  236  --   --   --  184  --   --   --   --   APTT 28  --   --   --   --   --   --   --   --   --   HEPARINUNFRC  --   --   --   --   --   --   --  0.17*  --   --   CREATININE  --  1.71*  --   --   --  1.49*  --   --  1.50*  --   TROPONINIHS  --  743*  --  1,799*  --   --  >24,000*  --   --   --    < > = values in this interval not displayed.     Estimated Creatinine Clearance: 41.4 mL/min (A) (by C-G formula based on SCr of 1.5 mg/dL (H)).   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 C   History of suicidal ideation    thought about overdose in the past   HIV infection ([place])    Hypertension    Neuropathy    Sleep difficulties    Trichomonal infection    2009    Assessment: 65 y/o F s/p out of hospital cardiac arrest with ROSC. Starting heparin per pharmacy for r/o ACS. CBC ok, noted renal dysfunction. PTA meds reviewed.   Goal of Therapy:  Heparin level  0.3-0.7 units/ml Monitor platelets by anticoagulation protocol: Yes   Plan:  Increase IV heparin to 1100 units/hr. Repeat heparin level in 8 hrs. Daily heparin level and CBC.  [place] Crane, Roylene Reason, BCCP Clinical Pharmacist  11/24/2020 1:43 PM   [facility] pharmacy phone numbers are listed on [email or web address]

## 2020-11-24 NOTE — Progress Notes (Signed)
[facility] ADULT ICU REPLACEMENT PROTOCOL   The patient does apply for the [facility] Adult ICU Electrolyte Replacment Protocol based on the criteria listed below:   1.Exclusion criteria: TCTS patients, ECMO patients and Hypothermia Protocol, and   Dialysis patients 2. Is GFR >/= 30 ml/min? Yes.    Patient's GFR today is 38 3. Is SCr </= 2? Yes.   Patient's SCr is 1.49 mg/dL 4. Did SCr increase >/= 0.5 in 24 hours? No. 5.Pt's weight >40kg  Yes.   6. Abnormal electrolyte(s): Phos 1.7 .  7. Electrolytes replaced per protocol 8.  Call MD STAT for K+ </= 2.5, Phos </= 1, or Mag </= 1 Physician:    Ronda Fairly A 11/24/2020 5:29 AM

## 2020-11-24 NOTE — Procedures (Signed)
Patient Name: Sharon Norris  MRN: [id number]  Epilepsy Attending: Lora Havens  Referring Physician/Provider: Dr June Leap Date: 11/24/2020 Duration: 36.05 mins  Patient history: 65yo F s/p cardiac arrest. EEG to evaluate for seizure  Level of alertness:  comatose  AEDs during EEG study: Propofol  Technical aspects: This EEG study was done with scalp electrodes positioned according to the 10-20 International system of electrode placement. Electrical activity was acquired at a sampling rate of '500Hz'$  and reviewed with a high frequency filter of '70Hz'$  and a low frequency filter of '1Hz'$ . EEG data were recorded continuously and digitally stored.   Description: EEG showed continuous generalized low amplitude 2-3 Hz delta slowing. Generalized Spike and polyspikes were noted which were quasi-periodic every 1-3 seconds. Eeg was reactive noxious stimulation. Hyperventilation and photic stimulation were not performed.     ABNORMALITY - Polyspikes, generalized - Continuous slow, generalized  IMPRESSION: This study showed evidence of epileptogenicity with generalized onset as well as severe diffuse encephalopathy, nonspecific etiology but likely related to sedation,  anoxic/hypoxic brain injury. No  definite seizures were seen throughout the recording.  If suspicion for ictal- interictal activity remains a concern, a prolonged study can be considered.   Hanaan Gancarz Barbra Sarks

## 2020-11-24 NOTE — Progress Notes (Signed)
Intubated, sedated. Occasional PVCs, no serious arrhythmia. Echo pending. Will follow.

## 2020-11-24 NOTE — Progress Notes (Signed)
[facility] Progress Note Patient Name: Sharon Norris DOB: January 08, 1956 MRN: [id number]   Date of Service  11/24/2020  HPI/Events of Note  ABG on 50%/PRVC 24/TV 470/P 5 = 7.41/23.9/64/15.4.  eICU Interventions  Continue present ventilator management.     Intervention Category Major Interventions: Respiratory failure - evaluation and management  [name] 11/24/2020, 5:36 AM

## 2020-11-24 NOTE — Progress Notes (Signed)
EEG done at bedside with propofol off during hookup and throughout test. Results pending.

## 2020-11-25 ENCOUNTER — Inpatient Hospital Stay (HOSPITAL_COMMUNITY): Payer: Medicare Other

## 2020-11-25 DIAGNOSIS — F1410 Cocaine abuse, uncomplicated: Secondary | ICD-10-CM | POA: Diagnosis not present

## 2020-11-25 DIAGNOSIS — I214 Non-ST elevation (NSTEMI) myocardial infarction: Secondary | ICD-10-CM | POA: Diagnosis not present

## 2020-11-25 DIAGNOSIS — I469 Cardiac arrest, cause unspecified: Secondary | ICD-10-CM | POA: Diagnosis not present

## 2020-11-25 DIAGNOSIS — E872 Acidosis, unspecified: Secondary | ICD-10-CM

## 2020-11-25 DIAGNOSIS — G9340 Encephalopathy, unspecified: Secondary | ICD-10-CM

## 2020-11-25 DIAGNOSIS — I422 Other hypertrophic cardiomyopathy: Secondary | ICD-10-CM

## 2020-11-25 DIAGNOSIS — N179 Acute kidney failure, unspecified: Secondary | ICD-10-CM

## 2020-11-25 LAB — COMPREHENSIVE METABOLIC PANEL
ALT: 30 U/L (ref 0–44)
ALT: 27 U/L (ref 0–44)
ALT: 27 U/L (ref 0–44)
AST: 57 U/L — ABNORMAL HIGH (ref 15–41)
AST: 50 U/L — ABNORMAL HIGH (ref 15–41)
AST: 50 U/L — ABNORMAL HIGH (ref 15–41)
Albumin: 2.5 g/dL — ABNORMAL LOW (ref 3.5–5.0)
Albumin: 2.4 g/dL — ABNORMAL LOW (ref 3.5–5.0)
Albumin: 2.3 g/dL — ABNORMAL LOW (ref 3.5–5.0)
Alkaline Phosphatase: 62 U/L (ref 38–126)
Alkaline Phosphatase: 61 U/L (ref 38–126)
Alkaline Phosphatase: 59 U/L (ref 38–126)
Anion gap: 9 (ref 5–15)
Anion gap: 8 (ref 5–15)
Anion gap: 8 (ref 5–15)
BUN: 19 mg/dL (ref 8–23)
BUN: 17 mg/dL (ref 8–23)
BUN: 19 mg/dL (ref 8–23)
CO2: 15 mmol/L — ABNORMAL LOW (ref 22–32)
CO2: 16 mmol/L — ABNORMAL LOW (ref 22–32)
CO2: 17 mmol/L — ABNORMAL LOW (ref 22–32)
Calcium: 7.9 mg/dL — ABNORMAL LOW (ref 8.9–10.3)
Calcium: 8 mg/dL — ABNORMAL LOW (ref 8.9–10.3)
Calcium: 7.8 mg/dL — ABNORMAL LOW (ref 8.9–10.3)
Chloride: 114 mmol/L — ABNORMAL HIGH (ref 98–111)
Chloride: 114 mmol/L — ABNORMAL HIGH (ref 98–111)
Chloride: 111 mmol/L (ref 98–111)
Creatinine, Ser: 1.53 mg/dL — ABNORMAL HIGH (ref 0.44–1.00)
Creatinine, Ser: 1.41 mg/dL — ABNORMAL HIGH (ref 0.44–1.00)
Creatinine, Ser: 1.39 mg/dL — ABNORMAL HIGH (ref 0.44–1.00)
GFR, Estimated: 37 mL/min — ABNORMAL LOW (ref >60)
GFR, Estimated: 41 mL/min — ABNORMAL LOW (ref >60)
GFR, Estimated: 42 mL/min — ABNORMAL LOW (ref >60)
Glucose, Bld: 110 mg/dL — ABNORMAL HIGH (ref 70–99)
Glucose, Bld: 97 mg/dL (ref 70–99)
Glucose, Bld: 156 mg/dL — ABNORMAL HIGH (ref 70–99)
Potassium: 4.5 mmol/L (ref 3.5–5.1)
Potassium: 4.6 mmol/L (ref 3.5–5.1)
Potassium: 4.2 mmol/L (ref 3.5–5.1)
Sodium: 138 mmol/L (ref 135–145)
Sodium: 138 mmol/L (ref 135–145)
Sodium: 136 mmol/L (ref 135–145)
Total Bilirubin: 0.6 mg/dL (ref 0.3–1.2)
Total Bilirubin: 0.5 mg/dL (ref 0.3–1.2)
Total Bilirubin: 0.3 mg/dL (ref 0.3–1.2)
Total Protein: 6.6 g/dL (ref 6.5–8.1)
Total Protein: 6.2 g/dL — ABNORMAL LOW (ref 6.5–8.1)
Total Protein: 6 g/dL — ABNORMAL LOW (ref 6.5–8.1)

## 2020-11-25 LAB — POCT I-STAT 7, (LYTES, BLD GAS, ICA,H+H)
Acid-base deficit: 10 mmol/L — ABNORMAL HIGH (ref 0.0–2.0)
Acid-base deficit: 8 mmol/L — ABNORMAL HIGH (ref 0.0–2.0)
Acid-base deficit: 8 mmol/L — ABNORMAL HIGH (ref 0.0–2.0)
Bicarbonate: 17.1 mmol/L — ABNORMAL LOW (ref 20.0–28.0)
Bicarbonate: 17.8 mmol/L — ABNORMAL LOW (ref 20.0–28.0)
Bicarbonate: 17.9 mmol/L — ABNORMAL LOW (ref 20.0–28.0)
Calcium, Ion: 1.17 mmol/L (ref 1.15–1.40)
Calcium, Ion: 1.17 mmol/L (ref 1.15–1.40)
Calcium, Ion: 1.2 mmol/L (ref 1.15–1.40)
HCT: 49 % — ABNORMAL HIGH (ref 36.0–46.0)
HCT: 31 % — ABNORMAL LOW (ref 36.0–46.0)
HCT: 31 % — ABNORMAL LOW (ref 36.0–46.0)
Hemoglobin: 16.7 g/dL — ABNORMAL HIGH (ref 12.0–15.0)
Hemoglobin: 10.5 g/dL — ABNORMAL LOW (ref 12.0–15.0)
Hemoglobin: 10.5 g/dL — ABNORMAL LOW (ref 12.0–15.0)
O2 Saturation: 96 %
O2 Saturation: 93 %
O2 Saturation: 94 %
Patient temperature: 37.5
Patient temperature: 37.1
Patient temperature: 37.2
Potassium: 4.4 mmol/L (ref 3.5–5.1)
Potassium: 4.5 mmol/L (ref 3.5–5.1)
Potassium: 4 mmol/L (ref 3.5–5.1)
Sodium: 142 mmol/L (ref 135–145)
Sodium: 140 mmol/L (ref 135–145)
Sodium: 137 mmol/L (ref 135–145)
TCO2: 18 mmol/L — ABNORMAL LOW (ref 22–32)
TCO2: 19 mmol/L — ABNORMAL LOW (ref 22–32)
TCO2: 19 mmol/L — ABNORMAL LOW (ref 22–32)
pCO2 arterial: 41.4 mmHg (ref 32.0–48.0)
pCO2 arterial: 34.8 mmHg (ref 32.0–48.0)
pCO2 arterial: 35.7 mmHg (ref 32.0–48.0)
pH, Arterial: 7.226 — ABNORMAL LOW (ref 7.350–7.450)
pH, Arterial: 7.317 — ABNORMAL LOW (ref 7.350–7.450)
pH, Arterial: 7.309 — ABNORMAL LOW (ref 7.350–7.450)
pO2, Arterial: 101 mmHg (ref 83.0–108.0)
pO2, Arterial: 71 mmHg — ABNORMAL LOW (ref 83.0–108.0)
pO2, Arterial: 76 mmHg — ABNORMAL LOW (ref 83.0–108.0)

## 2020-11-25 LAB — MAGNESIUM
Magnesium: 2.1 mg/dL (ref 1.7–2.4)
Magnesium: 2.1 mg/dL (ref 1.7–2.4)
Magnesium: 2 mg/dL (ref 1.7–2.4)

## 2020-11-25 LAB — CBC
HCT: 33.7 % — ABNORMAL LOW (ref 36.0–46.0)
Hemoglobin: 10.9 g/dL — ABNORMAL LOW (ref 12.0–15.0)
MCH: 32.4 pg (ref 26.0–34.0)
MCHC: 32.3 g/dL (ref 30.0–36.0)
MCV: 100.3 fL — ABNORMAL HIGH (ref 80.0–100.0)
Platelets: 166 10*3/uL (ref 150–400)
RBC: 3.36 MIL/uL — ABNORMAL LOW (ref 3.87–5.11)
RDW: 13.8 % (ref 11.5–15.5)
WBC: 13.8 10*3/uL — ABNORMAL HIGH (ref 4.0–10.5)
nRBC: 0 % (ref 0.0–0.2)

## 2020-11-25 LAB — GLUCOSE, CAPILLARY
Glucose-Capillary: 114 mg/dL — ABNORMAL HIGH (ref 70–99)
Glucose-Capillary: 102 mg/dL — ABNORMAL HIGH (ref 70–99)
Glucose-Capillary: 100 mg/dL — ABNORMAL HIGH (ref 70–99)
Glucose-Capillary: 95 mg/dL (ref 70–99)
Glucose-Capillary: 112 mg/dL — ABNORMAL HIGH (ref 70–99)
Glucose-Capillary: 142 mg/dL — ABNORMAL HIGH (ref 70–99)

## 2020-11-25 LAB — TSH: TSH: 3.105 u[IU]/mL (ref 0.350–4.500)

## 2020-11-25 LAB — PHOSPHORUS
Phosphorus: 4.6 mg/dL (ref 2.5–4.6)
Phosphorus: 4.3 mg/dL (ref 2.5–4.6)
Phosphorus: 4.3 mg/dL (ref 2.5–4.6)

## 2020-11-25 LAB — VALPROIC ACID LEVEL: Valproic Acid Lvl: <10 ug/mL — ABNORMAL LOW (ref 50.0–100.0)

## 2020-11-25 LAB — HEPARIN LEVEL (UNFRACTIONATED): Heparin Unfractionated: 0.39 IU/mL (ref 0.30–0.70)

## 2020-11-25 MED ORDER — ABACAVIR-DOLUTEGRAVIR-LAMIVUD 600-50-300 MG PO TABS
1.0000 | ORAL_TABLET | Freq: Every day | ORAL | Status: DC
  Administered 2020-11-26 – 2020-11-27 (×2): 1 via ORAL
  Filled 2020-11-25 (×3): qty 1

## 2020-11-25 MED ORDER — DOLUTEGRAVIR SODIUM 50 MG PO TABS
50.0000 mg | ORAL_TABLET | Freq: Every day | ORAL | Status: DC
  Administered 2020-11-25: 50 mg
  Filled 2020-11-25: qty 1

## 2020-11-25 MED ORDER — SODIUM BICARBONATE 8.4 % IV SOLN
INTRAVENOUS | Status: DC
  Filled 2020-11-25 (×4): qty 1000

## 2020-11-25 MED ORDER — ABACAVIR SULFATE 300 MG PO TABS
600.0000 mg | ORAL_TABLET | Freq: Every day | ORAL | Status: DC
  Administered 2020-11-25: 600 mg
  Filled 2020-11-25: qty 2

## 2020-11-25 MED ORDER — LAMIVUDINE 150 MG PO TABS
300.0000 mg | ORAL_TABLET | Freq: Every day | ORAL | Status: DC
  Administered 2020-11-25: 300 mg
  Filled 2020-11-25: qty 2

## 2020-11-25 MED ORDER — ABACAVIR SULFATE-LAMIVUDINE 600-300 MG PO TABS
1.0000 | ORAL_TABLET | Freq: Every day | ORAL | Status: DC
  Filled 2020-11-25: qty 1

## 2020-11-25 MED ORDER — ACETAMINOPHEN 325 MG PO TABS: 650.0000 mg | ORAL_TABLET | ORAL | Status: DC

## 2020-11-25 MED ORDER — VITAL 1.5 CAL PO LIQD
1000.0000 mL | ORAL | Status: DC
  Administered 2020-11-25 – 2020-11-28 (×3): 1000 mL

## 2020-11-25 MED ORDER — THIAMINE HCL 100 MG PO TABS
100.0000 mg | ORAL_TABLET | Freq: Every day | ORAL | Status: DC
  Administered 2020-11-26 – 2020-11-27 (×2): 100 mg
  Filled 2020-11-25 (×2): qty 1

## 2020-11-25 MED ORDER — CARVEDILOL 6.25 MG PO TABS
6.2500 mg | ORAL_TABLET | Freq: Two times a day (BID) | ORAL | Status: DC
  Administered 2020-11-25 – 2020-11-28 (×6): 6.25 mg
  Filled 2020-11-25 (×6): qty 1

## 2020-11-25 MED ORDER — ACETAMINOPHEN 160 MG/5ML PO SOLN
650.0000 mg | ORAL | Status: DC
  Administered 2020-11-25 – 2020-11-28 (×16): 650 mg
  Filled 2020-11-25 (×17): qty 20.3

## 2020-11-25 MED ORDER — PROSOURCE TF PO LIQD
45.0000 mL | Freq: Three times a day (TID) | ORAL | Status: DC
Start: 2020-11-25 — End: 2020-11-28
  Administered 2020-11-25 – 2020-11-27 (×8): 45 mL
  Filled 2020-11-25 (×8): qty 45

## 2020-11-25 MED ORDER — PANTOPRAZOLE 2 MG/ML SUSPENSION
40.0000 mg | Freq: Every day | ORAL | Status: DC
  Administered 2020-11-25 – 2020-11-27 (×3): 40 mg
  Filled 2020-11-25 (×3): qty 20

## 2020-11-25 NOTE — Progress Notes (Signed)
ANTICOAGULATION CONSULT NOTE  Pharmacy Consult for Heparin  Indication: chest pain/ACS, s/p cardiac arrest with ROSC  Allergies  Allergen Reactions   Benadryl [Diphenhydramine Hcl] Hypertension   Ribavirin Rash   Viekira Pak [Ombitasvir-Paritaprevir-Ritonavir-Dasabuvir] Hives    Patient Measurements: Height: '5\' 6"'$  (167.6 cm) Weight: 92.3 kg (203 lb 7.8 oz) IBW/kg (Calculated) : 59.3 Vital Signs: Temp: 98.2 F (36.8 C) (10/10 0723) Temp Source: Core (Comment) (10/10 0400) BP: 126/60 (10/10 0700) Pulse Rate: 90 (10/10 0700)  Labs: Recent Labs     0000 11/27/2020 2115 11/24/2020 2118 12/12/2020 2131 11/24/20 0025 11/24/20 0333 11/24/20 0343 11/24/20 0855 11/24/20 0953 11/24/20 1226 11/24/20 1237 11/24/20 2018 11/24/20 2033 11/24/20 2055 11/25/20 0417 11/25/20 0433 11/25/20 0651  HGB  --   --  12.5   < >  --    < > 11.5*  --   --   --    < >  --    < > 9.5* 10.9* 16.7*  --   HCT  --   --  39.4   < >  --    < > 34.1*  --   --   --    < >  --    < > 28.0* 33.7* 49.0*  --   PLT  --   --  236  --   --   --  184  --   --   --   --   --   --   --  166  --   --   APTT  --  28  --   --   --   --   --   --   --   --   --   --   --   --   --   --   --   HEPARINUNFRC  --   --   --   --   --   --   --   --  0.17*  --   --  0.37  --   --   --   --  0.39  CREATININE   < >  --  1.71*  --   --   --  1.49*  --   --  1.50*  --  1.55*  --   --  1.53*  --   --   TROPONINIHS   < >  --  743*  --  1,799*  --   --  >24,000*  --  >24,000*  --   --   --   --   --   --   --    < > = values in this interval not displayed.     Estimated Creatinine Clearance: 40.8 mL/min (A) (by C-G formula based on SCr of 1.53 mg/dL (H)).   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 C   History of suicidal ideation    thought about  overdose in the past   HIV infection ([place])    Hypertension    Neuropathy    Sleep difficulties    Trichomonal infection    2009    Assessment: 65 y/o F s/p out of hospital cardiac arrest with ROSC. She is on  heparin per pharmacy for r/o ACS. Currently on TTM protocol.  -heparin level therapeutic at 0.39 on 1100 units/hr. CBC is stable. No s/x of bleeding per RN.   Goal of Therapy:  Heparin level 0.3-0.7 units/ml Monitor platelets by anticoagulation protocol: Yes   Plan:  -Continue heparin at 1100 units/hr -Daily heparin level and CBC  Cathrine Muster, PharmD PGY2 Cardiology Pharmacy Resident Phone: [phone] 11/25/2020  9:15 AM  Please check [email or web address] for unit-specific pharmacy phone numbers.

## 2020-11-25 NOTE — Progress Notes (Signed)
NAME:  Sharon Norris, MRN:  [id number], DOB:  1955/10/24, LOS: 2 ADMISSION DATE:  12/09/2020, CONSULTATION DATE:  11/19/2020 REFERRING MD:  Sharon Ishikawa, MD, CHIEF COMPLAINT:  PEA cardiac arrest   History of Present Illness:  Sharon Norris is a 65 year old lady with PMH significant for IVDA, hepatitis C, hypertension among other conditions.  It was reported that the patient had a witnessed cardiac arrest at home.  There were no bystander CPR. EMS arrived and found patient in PEA. POSC was archived after a total of 20 minutes of ACLS protocol.  Patient had transient ROSC at 10 minutes but lost pulses but regained it after a second 10 minutes of ACLS. Protocol.  She was intubated in the ED as she arrived with a [facility] airway.  As per the patient's grand daughter, the patient had complained of chest pain intermittently the day prior to presentation.    Pertinent  Medical History  IV cocaine abuse HIV disease hypertension  Significant Hospital Events: Including procedures, antibiotic start and stop dates in addition to other pertinent events   12/04/2020 Intubation; right femoral TLC; Left radial arterial line 10/9 Echo > EF 60-65%, severe LVH, G2DD.  Objective   Blood pressure 120/67, pulse 92, temperature 98.2 F (36.8 C), resp. rate (!) 25, height '5\' 6"'$  (1.676 m), weight 92.3 kg, SpO2 98 %.    Vent Mode: PRVC FiO2 (%):  [40 %] 40 % Set Rate:  [24 bmp] 24 bmp Vt Set:  [470 mL] 470 mL PEEP:  [5 cmH20] 5 cmH20 Plateau Pressure:  [21 cmH20-30 cmH20] 30 cmH20   Intake/Output Summary (Last 24 hours) at 11/25/2020 0748 Last data filed at 11/25/2020 0500 Gross per 24 hour  Intake 2348.11 ml  Output 1050 ml  Net 1298.11 ml    Filed Weights   12/13/2020 2029 11/24/20 0329 11/25/20 0400  Weight: 100 kg 91.4 kg 92.3 kg    Examination: General: obese fm, intubated, in NAD HENT: NCAT, MMM Lungs: CTAB, BL vented breaths  Cardiovascular: RRR, no M/R/G Abdomen: obese, soft nt nd   Extremities: no edema  Neuro: Sedated, not responsive GU: foley    Assessment & Plan:   PEA Cardiac Arrest - Suspect cocaine related  Plan: Normothermia protocol  Continue supportive care Neuroprognostication once over TTM and sedation needs lowered  Acute NSTEMI vs supply demand mismatch -Possibly cocaine related/induced  Plan:  Continue Heparin infusion  Continue ASA '81mg'$  daily  Continue Atorvastatin  Holding BB(-)   Malignant hypertension - CT head negative Plan:  Continue nicardipine infusion and wean, goal SBP ~140 Continue oral amlodipine + HCTZ  Acute hypoxic and hypercarbic respiratory failure secondary  P: Full vent support  Wean as tolerated when sedation lowered and mental status improves Spo2 goal >90%  SAT SBT when able   Acute kidney injury superimposed on CKD 3b NAGMA Plan:  Start HCO3 gtt Follow UOP Avoid nephrotoxic agents  Avoid ace and arb in setting of arrest for bp control   Acute metabolic encephalopathy  Hx of substance abuse with positive toxicology screen for cocaine, unclear if alcohol plays an role in here  P; Observe for withdrawal symptoms   Leukocytosis - post arrest, suspect reactive though at risk for aspiration. P: Supportive care  Hx HIV disease, hepatitis C P: OP follow up     Best Practice (right click and "Reselect all SmartList Selections" daily)   Diet/type: NPO DVT prophylaxis: systemic heparin GI prophylaxis: PPI Lines: Central line, Arterial line Foley:  Yes, and it is still needed Code Status:  full code Last date of multidisciplinary goals of care discussion   CC time: 35 min.   Montey Hora, [facility] For pager details, please see AMION or use Epic chat  After 1900, please call [facility] for cross coverage needs 11/25/2020, 7:58 AM

## 2020-11-25 NOTE — Progress Notes (Signed)
Progress Note  Patient Name: Sharon Norris Date of Encounter: 11/25/2020  [facility] Cardiologist: None   Subjective   Unresponsive to any stimuli (roughly 12 hours after returning to normothermia, still on sedation).  Inpatient Medications    Scheduled Meds:  acetaminophen  650 mg Oral Q4H   Or   acetaminophen (TYLENOL) oral liquid 160 mg/5 mL  650 mg Per Tube Q4H   Or   acetaminophen  650 mg Rectal Q4H   amLODipine  5 mg Per Tube Daily   aspirin  81 mg Per Tube Daily   atorvastatin  40 mg Per Tube Daily   chlorhexidine gluconate (MEDLINE KIT)  15 mL Mouth Rinse BID   Chlorhexidine Gluconate Cloth  6 each Topical Q0600   Chlorhexidine Gluconate Cloth  6 each Topical Daily   docusate  100 mg Per Tube BID   fentaNYL (SUBLIMAZE) injection  100 mcg Intravenous Once   hydrochlorothiazide  25 mg Per Tube Daily   insulin aspart  0-15 Units Subcutaneous Q4H   mouth rinse  15 mL Mouth Rinse 10 times per day   pantoprazole (PROTONIX) IV  40 mg Intravenous QHS   polyethylene glycol  17 g Per Tube Daily   sodium chloride flush  10-40 mL Intracatheter Q12H   thiamine injection  100 mg Intravenous Daily   Continuous Infusions:  sodium chloride     fentaNYL infusion INTRAVENOUS 150 mcg/hr (11/25/20 0821)   heparin 1,100 Units/hr (11/25/20 0700)   niCARDipine 2 mg/hr (11/25/20 0700)   propofol (DIPRIVAN) infusion 20 mcg/kg/min (11/25/20 0700)   sodium bicarbonate 150 mEq in D5W infusion 50 mL/hr at 11/25/20 0801   PRN Meds: Place/Maintain arterial line **AND** sodium chloride, docusate, fentaNYL, polyethylene glycol, sodium chloride flush   Vital Signs    Vitals:   11/25/20 0500 11/25/20 0600 11/25/20 0700 11/25/20 0723  BP: 120/67 (!) 88/65 126/60   Pulse: 92 86 90   Resp: (!) 25 (!) 24 (!) 24   Temp: 99.3 F (37.4 C) 98.4 F (36.9 C) 98.4 F (36.9 C) 98.2 F (36.8 C)  TempSrc:      SpO2: 98% 99% 97%   Weight:      Height:        Intake/Output Summary (Last  24 hours) at 11/25/2020 0823 Last data filed at 11/25/2020 0700 Gross per 24 hour  Intake 2238.43 ml  Output 1125 ml  Net 1113.43 ml   Last 3 Weights 11/25/2020 11/24/2020 12/04/2020  Weight (lbs) 203 lb 7.8 oz 201 lb 8 oz 220 lb 7.4 oz  Weight (kg) 92.3 kg 91.4 kg 100 kg      Telemetry    NSR - Personally Reviewed  ECG    NSR, biatrial enlargement, LVH - Personally Reviewed  Physical Exam  Unresponsive to pain GEN: No acute distress. Pupils slightly miotic, but asymmetrical (L larger than R)  Neck: No JVD Cardiac: RRR, no murmurs, rubs, or gallops.  Respiratory: Clear to auscultation bilaterally. GI: Soft, nontender, non-distended  MS: No edema; No deformity. Neuro:  Nonfocal  Psych: Normal affect   Labs    High Sensitivity Troponin:   Recent Labs  Lab 11/20/2020 2118 11/24/20 0025 11/24/20 0855 11/24/20 1226  TROPONINIHS 743* 1,799* >24,000* >24,000*     Chemistry Recent Labs  Lab 11/24/20 1226 11/24/20 1237 11/24/20 2018 11/24/20 2033 11/24/20 2055 11/25/20 0417 11/25/20 0433  NA 138   < > 138   < > 142 138 142  K 3.7   < >  3.4*   < > 3.4* 4.5 4.4  CL 113*  --  111  --   --  114*  --   CO2 16*  --  15*  --   --  15*  --   GLUCOSE 132*  --  113*  --   --  110*  --   BUN 23  --  22  --   --  19  --   CREATININE 1.50*  --  1.55*  --   --  1.53*  --   CALCIUM 7.6*  --  7.5*  --   --  7.9*  --   MG 2.2  --  2.0  --   --  2.1  --   PROT 6.1*  --  6.1*  --   --  6.6  --   ALBUMIN 2.4*  --  2.4*  --   --  2.5*  --   AST 80*  --  63*  --   --  57*  --   ALT 31  --  30  --   --  30  --   ALKPHOS 62  --  57  --   --  62  --   BILITOT 0.2*  --  0.5  --   --  0.6  --   GFRNONAA 38*  --  36*  --   --  37*  --   ANIONGAP 9  --  12  --   --  9  --    < > = values in this interval not displayed.    Lipids  Recent Labs  Lab 11/24/20 0343  CHOL 167  TRIG 208*  HDL 36*  LDLCALC 89  CHOLHDL 4.6    Hematology Recent Labs  Lab 11/19/2020 2118 12/01/2020 2131  11/24/20 0343 11/24/20 1237 11/24/20 2055 11/25/20 0417 11/25/20 0433  WBC 18.6*  --  13.1*  --   --  13.8*  --   RBC 3.87  --  3.54*  --   --  3.36*  --   HGB 12.5   < > 11.5*   < > 9.5* 10.9* 16.7*  HCT 39.4   < > 34.1*   < > 28.0* 33.7* 49.0*  MCV 101.8*  --  96.3  --   --  100.3*  --   MCH 32.3  --  32.5  --   --  32.4  --   MCHC 31.7  --  33.7  --   --  32.3  --   RDW 13.2  --  13.2  --   --  13.8  --   PLT 236  --  184  --   --  166  --    < > = values in this interval not displayed.   Thyroid No results for input(s): TSH, FREET4 in the last 168 hours.  BNPNo results for input(s): BNP, PROBNP in the last 168 hours.  DDimer No results for input(s): DDIMER in the last 168 hours.   Radiology    CT HEAD WO CONTRAST (5MM)  Result Date: 12/15/2020 CLINICAL DATA:  Altered mental status EXAM: CT HEAD WITHOUT CONTRAST TECHNIQUE: Contiguous axial images were obtained from the base of the skull through the vertex without intravenous contrast. COMPARISON:  11/21/2019 FINDINGS: Brain: Normal anatomic configuration. Parenchymal volume loss is commensurate with the patient's age. Remote lacunar infarct noted within the left basal ganglia, unchanged. No abnormal intra or extra-axial mass lesion or  fluid collection. No abnormal mass effect or midline shift. No evidence of acute intracranial hemorrhage or infarct. Ventricular size is normal. Cerebellum unremarkable. Vascular: No asymmetric hyperdense vasculature at the skull base. Skull: Intact Sinuses/Orbits: There is dense opacification of the right maxillary sinus, new since prior examination. Remaining paranasal sinuses are clear. Orbits are unremarkable. Other: Mastoid air cells and middle ear cavities are clear. IMPRESSION: No acute intracranial abnormality. Remote lacunar infarct as described above. Right maxillary sinus disease, new since prior examination. Electronically Signed   By: Fidela Salisbury M.D.   On: 11/21/2020 23:28   CT Angio Chest  PE W and/or Wo Contrast  Result Date: 12/02/2020 CLINICAL DATA:  PE suspected, high prob. Dyspnea, altered mental status EXAM: CT ANGIOGRAPHY CHEST WITH CONTRAST TECHNIQUE: Multidetector CT imaging of the chest was performed using the standard protocol during bolus administration of intravenous contrast. Multiplanar CT image reconstructions and MIPs were obtained to evaluate the vascular anatomy. CONTRAST:  15m OMNIPAQUE IOHEXOL 350 MG/ML SOLN COMPARISON:  06/21/2020 FINDINGS: Cardiovascular: There is suboptimal opacification of the pulmonary arterial tree with preferential opacification of the aorta. The examination is adequate only for exclusion of a central pulmonary embolism involving the main, right and left, and lobar pulmonary arteries of which there is none. The segmental and subsegmental pulmonary arteries are not adequately opacified to definitively exclude the presence of small pulmonary emboli. The central pulmonary arteries are of normal caliber. There is hypoenhancement of the left ventricular apex and mid cavity and apical septum which may reflect changes of an acute myocardial infarction. This appears new since prior examination. Cardiac size within normal limits. No pericardial effusion. Mild atherosclerotic calcification within the thoracic aorta. No aortic aneurysm. Mediastinum/Nodes: No pathologic thoracic adenopathy. Nasogastric tube extends into the mid body of the stomach beyond the margin of the examination. Endotracheal tube is seen 2.5 cm above the carina. Lungs/Pleura: There is peribronchovascular pulmonary infiltrate and mild smooth septal thickening as well as scattered ground-glass pulmonary infiltrate all most in keeping with mild pulmonary edema, possibly cardiogenic in nature. Mild bibasilar atelectasis. No pneumothorax. Trace bilateral pleural effusions are present. Upper Abdomen: Cholelithiasis without pericholecystic inflammatory change. No acute abnormality. Musculoskeletal:  No acute bone abnormality. No lytic or blastic bone lesion. Review of the MIP images confirms the above findings. IMPRESSION: Technically limited examination without evidence of pulmonary embolism through the lobar pulmonary arterial tree. The segmental and subsegmental pulmonary arteries are not adequately opacified to exclude the presence of small pulmonary emboli. Hypoenhancement of the left ventricular apex and mid cavity and apical septal walls suspicious for myocardial ischemia and/or infarction. Correlation with cardiac enzymes and EKG examination is recommended for further evaluation. Mild peribronchovascular and interstitial pulmonary infiltrate most in keeping with mild pulmonary edema, likely cardiogenic in nature. Trace bilateral pleural effusions. Support tubes in appropriate position. Aortic Atherosclerosis (ICD10-I70.0). These results were called by telephone at the time of interpretation on 11/27/2020 at 11:21 pm [facility], who verbally acknowledged these results. Electronically Signed   By: AFidela SalisburyM.D.   On: 11/30/2020 23:23   DG Chest Portable 1 View  Result Date: 12/13/2020 CLINICAL DATA:  Intubated EXAM: PORTABLE CHEST 1 VIEW COMPARISON:  06/21/2020 FINDINGS: Single frontal view of the chest demonstrates endotracheal tube overlying tracheal air column tip midway between thoracic inlet and carina. Enteric catheter passes below diaphragm, coiled over the gastric fundus. Tip of catheter is excluded by collimation. Cardiac silhouette is stable. There is increased central vascular congestion with bilateral  perihilar airspace disease greatest in the upper lobes. No effusion or pneumothorax. No acute bony abnormalities. IMPRESSION: 1. No complication after intubation. 2. Increased vascular congestion with bilateral perihilar airspace disease most consistent with edema. Electronically Signed   By: Randa Ngo M.D.   On: 11/18/2020 21:36   EEG adult  Result Date:  11/24/2020 Lora Havens, MD     11/24/2020  1:45 PM Patient Name: Rondia Higginbotham MRN: [id number] Epilepsy Attending: Lora Havens Referring Physician/Provider: Dr June Leap Date: 11/24/2020 Duration: 36.05 mins Patient history: 65yo F s/p cardiac arrest. EEG to evaluate for seizure Level of alertness:  comatose AEDs during EEG study: Propofol Technical aspects: This EEG study was done with scalp electrodes positioned according to the 10-20 International system of electrode placement. Electrical activity was acquired at a sampling rate of '500Hz'  and reviewed with a high frequency filter of '70Hz'  and a low frequency filter of '1Hz' . EEG data were recorded continuously and digitally stored. Description: EEG showed continuous generalized low amplitude 2-3 Hz delta slowing. Generalized Spike and polyspikes were noted which were quasi-periodic every 1-3 seconds. Eeg was reactive noxious stimulation. Hyperventilation and photic stimulation were not performed.   ABNORMALITY - Polyspikes, generalized - Continuous slow, generalized IMPRESSION: This study showed evidence of epileptogenicity with generalized onset as well as severe diffuse encephalopathy, nonspecific etiology but likely related to sedation,  anoxic/hypoxic brain injury. No  definite seizures were seen throughout the recording. If suspicion for ictal- interictal activity remains a concern, a prolonged study can be considered. Lora Havens   ECHOCARDIOGRAM COMPLETE  Result Date: 11/24/2020    ECHOCARDIOGRAM REPORT   Patient Name:   SATIA WINGER Date of Exam: 11/24/2020 Medical Rec #:  [id number]      Height:       66.0 in Accession #:    [id number]     Weight:       201.5 lb Date of Birth:  1955/05/05       BSA:          2.006 m Patient Age:    14 years       BP:           118/60 mmHg Patient Gender: F              HR:           71 bpm. Exam Location:  Inpatient Procedure: 2D Echo Indications:     acute myocardial infarction  History:         Patient  has prior history of Echocardiogram examinations, most                  recent 11/21/2019. CAD, chronic kidney disease. HIV.; Risk                  Factors:Hypertension.  Sonographer:     Johny Chess Referring Phys:  [id number] QVZDG A RICHARDS Diagnosing Phys: Oswaldo Milian MD  Sonographer Comments: Echo performed with patient supine and on artificial respirator. IMPRESSIONS  1. Left ventricular ejection fraction, by estimation, is 60 to 65%. The left ventricle has normal function. The left ventricle has no regional wall motion abnormalities. There is severe left ventricular hypertrophy. Left ventricular diastolic parameters  are consistent with Grade II diastolic dysfunction (pseudonormalization). Elevated left atrial pressure. LVOT flow acceleration.  2. Right ventricular systolic function is normal. The right ventricular size is normal. There is moderately elevated pulmonary artery systolic pressure. The estimated right ventricular systolic pressure is 48.4  mmHg.  3. The mitral valve is normal in structure. Trivial mitral valve regurgitation.  4. The aortic valve is tricuspid. Aortic valve regurgitation is not visualized. Mild aortic valve sclerosis is present, with no evidence of aortic valve stenosis.  5. The inferior vena cava is normal in size with greater than 50% respiratory variability, suggesting right atrial pressure of 3 mmHg. FINDINGS  Left Ventricle: Left ventricular ejection fraction, by estimation, is 60 to 65%. The left ventricle has normal function. The left ventricle has no regional wall motion abnormalities. The left ventricular internal cavity size was normal in size. There is  severe left ventricular hypertrophy. Left ventricular diastolic parameters are consistent with Grade II diastolic dysfunction (pseudonormalization). Elevated left atrial pressure. Right Ventricle: The right ventricular size is normal. No increase in right ventricular wall thickness. Right ventricular systolic  function is normal. There is moderately elevated pulmonary artery systolic pressure. The tricuspid regurgitant velocity is 3.37 m/s, and with an assumed right atrial pressure of 3 mmHg, the estimated right ventricular systolic pressure is 57.8 mmHg. Left Atrium: Left atrial size was normal in size. Right Atrium: Right atrial size was normal in size. Pericardium: There is no evidence of pericardial effusion. Mitral Valve: The mitral valve is normal in structure. Trivial mitral valve regurgitation. Tricuspid Valve: The tricuspid valve is normal in structure. Tricuspid valve regurgitation is mild. Aortic Valve: The aortic valve is tricuspid. Aortic valve regurgitation is not visualized. Mild aortic valve sclerosis is present, with no evidence of aortic valve stenosis. Pulmonic Valve: The pulmonic valve was not well visualized. Pulmonic valve regurgitation is trivial. Aorta: The aortic root and ascending aorta are structurally normal, with no evidence of dilitation. Venous: The inferior vena cava is normal in size with greater than 50% respiratory variability, suggesting right atrial pressure of 3 mmHg. IAS/Shunts: The interatrial septum was not well visualized.  LEFT VENTRICLE PLAX 2D LVIDd:         4.30 cm     Diastology LVIDs:         2.30 cm     LV e' medial:    5.11 cm/s LV PW:         1.40 cm     LV E/e' medial:  21.5 LV IVS:        1.00 cm     LV e' lateral:   5.55 cm/s LVOT diam:     1.60 cm     LV E/e' lateral: 19.8 LVOT Area:     2.01 cm  LV Volumes (MOD) LV vol d, MOD A2C: 58.1 ml LV vol s, MOD A2C: 28.0 ml LV SV MOD A2C:     30.1 ml RIGHT VENTRICLE             IVC RV S prime:     13.30 cm/s  IVC diam: 1.10 cm TAPSE (M-mode): 1.8 cm LEFT ATRIUM             Index        RIGHT ATRIUM          Index LA diam:        4.20 cm 2.09 cm/m   RA Area:     9.76 cm LA Vol (A2C):   44.3 ml 22.08 ml/m  RA Volume:   18.70 ml 9.32 ml/m LA Vol (A4C):   59.6 ml 29.71 ml/m LA Biplane Vol: 54.6 ml 27.22 ml/m   AORTA Ao Asc  diam: 2.70 cm MITRAL VALVE  TRICUSPID VALVE MV Area (PHT): 2.66 cm     TR Peak grad:   45.4 mmHg MV Decel Time: 285 msec     TR Vmax:        337.00 cm/s MV E velocity: 110.00 cm/s MV A velocity: 137.00 cm/s  SHUNTS MV E/A ratio:  0.80         Systemic Diam: 1.60 cm Oswaldo Milian MD Electronically signed by Oswaldo Milian MD Signature Date/Time: 11/24/2020/2:47:14 PM    Final (Updated)     Cardiac Studies   ECHO 11/24/2020 1. Left ventricular ejection fraction, by estimation, is 60 to 65%. The left ventricle has normal function. The left ventricle has no regional wall motion abnormalities. There is severe left ventricular hypertrophy. Left ventricular diastolic parameters  are consistent with Grade II diastolic dysfunction (pseudonormalization). Elevated left atrial pressure. LVOT flow acceleration.   2. Right ventricular systolic function is normal. The right ventricular size is normal. There is moderately elevated pulmonary artery systolic pressure. The estimated right ventricular systolic pressure is 16.0 mmHg.   3. The mitral valve is normal in structure. Trivial mitral valve regurgitation.   4. The aortic valve is tricuspid. Aortic valve regurgitation is not visualized. Mild aortic valve sclerosis is present, with no evidence of aortic valve stenosis.   5. The inferior vena cava is normal in size with greater than 50% respiratory variability, suggesting right atrial pressure of 3 mmHg.   Patient Profile     65 y.o. female with PEA arrest 11/24/2020, preceded by severe dyspnea and multiple previous presentations with symptoms of unstable angina and abnormal cardiac enzymes, never receiving full workup due to leaving AMA. Background problems include IV drug use, major depression, previous stroke, HTN, hypercholesterolemia, former smoker, HIV, neuropathy. UDS + cocaine on multiple evaluations, including on this admission.  Assessment & Plan    S/P cardiac arrest: initial  rhythm PEA. Prolonged resuscitation, conflicting reports on whether or not she received effective bystander CPR. No significant arrhythmia since admission. Currently neuro status is poor, but early after return to normothermia. Some epileptogenic activity on EEG. Prognosis is very guarded. NSTEMI: despite marked elevation in hsTrop I , echo shows normal LVEF and wall motion, as well as severe LVH (latter may explain severe enzyme elevation in absence of major epicardial vessel occlusion).  Noted: "hypoenhancement of the LV apex and mid cavity and apical septum" on CT PE scan. On IV heparin. Further w/u for CAD will depend on degree of neuro recovery. Restart oral beta blockers when able. On aspirin, atorvastatin. HTN: severe on admission, now controlled on cardene drip. Cocaine abuse: could very well be incriminated as cause of her arrest, on background of hypertensive hypertrophic cardiomyopathy +/- CAD. Benefit of carvedilol (with alpha blocker properties) outweighs risks at this point, 48 h or so after presumed cocaine use. HIV: on Triumeq, viral load detectable, but low. CRITICAL CARE Performed by: Dani Gobble Cordarryl Monrreal   Total critical care time: 45 minutes  Critical care time was exclusive of separately billable procedures and treating other patients.  Critical care was necessary to treat or prevent imminent or life-threatening deterioration.  Critical care was time spent personally by me on the following activities: development of treatment plan with patient and/or surrogate as well as nursing, discussions with consultants, evaluation of patient's response to treatment, examination of patient, obtaining history from patient or surrogate, ordering and performing treatments and interventions, ordering and review of laboratory studies, ordering and review of radiographic studies, pulse oximetry and re-evaluation of patient's  condition.      For questions or updates, please contact [place] Please consult [email or web address] for contact info under        Signed, Sanda Klein, MD  11/25/2020, 8:23 AM

## 2020-11-25 NOTE — Progress Notes (Signed)
[facility] notified for repeat ABG and request for meds to help with copious oral secretions. Request for sputum culture given foul odor of oral/nasal secretions. No new orders at this time.

## 2020-11-25 NOTE — Progress Notes (Signed)
[place] Progress Note Patient Name: Sharon Norris DOB: October 21, 1955 MRN: [id number]   Date of Service  11/25/2020  HPI/Events of Note  Non-AGMA with PH 7.2  eICU Interventions  Low rate bicarb drip for 12 hrs     Intervention Category Major Interventions: Acid-Base disturbance - evaluation and management  Mauri Brooklyn, P 11/25/2020, 6:15 AM

## 2020-11-25 NOTE — Progress Notes (Signed)
Initial Nutrition Assessment  DOCUMENTATION CODES:   Not applicable  INTERVENTION:   TF via OG tube: Vital 1.5 @ 60 mL/hr (1260 mL/day) x 21 hrs (hold 2 hours before and 1 hour after Tivicay administration per pharmacy) 45 mL ProSource TF TID  Provides 2010 kcal, 118 gm of PRO, 963 mL of free water.   NUTRITION DIAGNOSIS:   Inadequate oral intake related to inability to eat as evidenced by NPO status.  GOAL:   Patient will meet greater than or equal to 90% of their needs  MONITOR:   Vent status, Labs, TF tolerance, I & O's  REASON FOR ASSESSMENT:   Consult Enteral/tube feeding initiation and management  ASSESSMENT:   65 y.o. female presented to the ED after cardiac arrest at home, pt also noted to have NSTEMI and AKI. PMH of Hepatitis C, IVDA, HTN, HIV, and CKD IV.   10/8: Intubated   Consult received for TF initiation and management. OG tube in stomach per abdominal x-ray.   Pt daughter at bedside. Per daughter, pt has been eating normal and has not noticed any weight loss. Stated that her mom would like to lose weight.    Per EMR, pt has not had any significant weight changes in the past year.   Patient is currently intubated on ventilator support MV: 13.8 L/min Temp (24hrs), Avg:98.6 F (37 C), Min:97 F (36.1 C), Max:99.5 F (37.5 C) BP (a-line): 115/48 MAP (a-line): 71  Continuous medications: Propofol: 12 ml/hr (provides 317 kcal daily from lipid) Sodium Bicarbonate 50 mL/hr Fentanyl Heparin Cardene  Medications reviewed and include: Colace, Epzicom, Tivicay, SSI 0-15 units q4h, Protonix, Miralax, Thiamine Labs reviewed: Creatinine 1.53, 24 hr BG trends 75-132 mg/dL  Admission Weight: 100 kg Current Weight: 92.3 kg  UOP: 1140 mL x 24 hours  I/O's: -1394 mL since admit   NUTRITION - FOCUSED PHYSICAL EXAM:  Flowsheet Row Most Recent Value  Orbital Region No depletion  Upper Arm Region No depletion  Thoracic and Lumbar Region Unable to  assess  [Artic Sun]  Buccal Region Unable to assess  Temple Region No depletion  Clavicle Bone Region No depletion  Clavicle and Acromion Bone Region No depletion  Scapular Bone Region No depletion  Dorsal Hand No depletion  Patellar Region No depletion  Anterior Thigh Region Unable to assess  [Artic Sun]  Posterior Calf Region No depletion  Edema (RD Assessment) None  Hair Unable to assess  [Hair Cap]  Eyes Unable to assess  Mouth Unable to assess  Skin Reviewed  Nails Reviewed       Diet Order:   Diet Order             Diet NPO time specified  Diet effective now                   EDUCATION NEEDS:   Not appropriate for education at this time  Skin:  Skin Assessment: Reviewed RN Assessment (Abrasion - L Groin, Breast)  Last BM:  Prior to Admission  Height:   Ht Readings from Last 1 Encounters:  12/11/2020 '5\' 6"'$  (1.676 m)    Weight:   Wt Readings from Last 1 Encounters:  11/25/20 92.3 kg    Ideal Body Weight:  59.1 kg  BMI:  Body mass index is 32.84 kg/m.  Estimated Nutritional Needs:   Kcal:  1800-2000  Protein:  115-130 grams  Fluid:  >/= 1.8 L    Harm Jou BS, PLDN Clinical Dietitian See AMiON for  contact information.

## 2020-11-26 ENCOUNTER — Inpatient Hospital Stay (HOSPITAL_COMMUNITY): Payer: Medicare Other

## 2020-11-26 DIAGNOSIS — M7989 Other specified soft tissue disorders: Secondary | ICD-10-CM

## 2020-11-26 DIAGNOSIS — F1410 Cocaine abuse, uncomplicated: Secondary | ICD-10-CM | POA: Diagnosis not present

## 2020-11-26 DIAGNOSIS — I469 Cardiac arrest, cause unspecified: Secondary | ICD-10-CM | POA: Diagnosis not present

## 2020-11-26 DIAGNOSIS — G9340 Encephalopathy, unspecified: Secondary | ICD-10-CM | POA: Diagnosis not present

## 2020-11-26 LAB — MAGNESIUM: Magnesium: 2.2 mg/dL (ref 1.7–2.4)

## 2020-11-26 LAB — BLOOD CULTURE ID PANEL (REFLEXED) - BCID2

## 2020-11-26 LAB — POCT I-STAT 7, (LYTES, BLD GAS, ICA,H+H)
Acid-base deficit: 6 mmol/L — ABNORMAL HIGH (ref 0.0–2.0)
Acid-base deficit: 4 mmol/L — ABNORMAL HIGH (ref 0.0–2.0)
Bicarbonate: 19.9 mmol/L — ABNORMAL LOW (ref 20.0–28.0)
Bicarbonate: 22.4 mmol/L (ref 20.0–28.0)
Calcium, Ion: 1.16 mmol/L (ref 1.15–1.40)
Calcium, Ion: 1.18 mmol/L (ref 1.15–1.40)
HCT: 28 % — ABNORMAL LOW (ref 36.0–46.0)
HCT: 31 % — ABNORMAL LOW (ref 36.0–46.0)
Hemoglobin: 9.5 g/dL — ABNORMAL LOW (ref 12.0–15.0)
Hemoglobin: 10.5 g/dL — ABNORMAL LOW (ref 12.0–15.0)
O2 Saturation: 83 %
O2 Saturation: 90 %
Patient temperature: 37.2
Patient temperature: 36.5
Potassium: 4.2 mmol/L (ref 3.5–5.1)
Potassium: 4.2 mmol/L (ref 3.5–5.1)
Sodium: 138 mmol/L (ref 135–145)
Sodium: 137 mmol/L (ref 135–145)
TCO2: 21 mmol/L — ABNORMAL LOW (ref 22–32)
TCO2: 24 mmol/L (ref 22–32)
pCO2 arterial: 40.7 mmHg (ref 32.0–48.0)
pCO2 arterial: 46.6 mmHg (ref 32.0–48.0)
pH, Arterial: 7.298 — ABNORMAL LOW (ref 7.350–7.450)
pH, Arterial: 7.286 — ABNORMAL LOW (ref 7.350–7.450)
pO2, Arterial: 53 mmHg — ABNORMAL LOW (ref 83.0–108.0)
pO2, Arterial: 65 mmHg — ABNORMAL LOW (ref 83.0–108.0)

## 2020-11-26 LAB — CBC
HCT: 30.9 % — ABNORMAL LOW (ref 36.0–46.0)
Hemoglobin: 9.9 g/dL — ABNORMAL LOW (ref 12.0–15.0)
MCH: 31.9 pg (ref 26.0–34.0)
MCHC: 32 g/dL (ref 30.0–36.0)
MCV: 99.7 fL (ref 80.0–100.0)
Platelets: 156 10*3/uL (ref 150–400)
RBC: 3.1 MIL/uL — ABNORMAL LOW (ref 3.87–5.11)
RDW: 13.9 % (ref 11.5–15.5)
WBC: 11.2 10*3/uL — ABNORMAL HIGH (ref 4.0–10.5)
nRBC: 0 % (ref 0.0–0.2)

## 2020-11-26 LAB — BASIC METABOLIC PANEL
Anion gap: 9 (ref 5–15)
BUN: 20 mg/dL (ref 8–23)
CO2: 20 mmol/L — ABNORMAL LOW (ref 22–32)
Calcium: 7.9 mg/dL — ABNORMAL LOW (ref 8.9–10.3)
Chloride: 108 mmol/L (ref 98–111)
Creatinine, Ser: 1.36 mg/dL — ABNORMAL HIGH (ref 0.44–1.00)
GFR, Estimated: 43 mL/min — ABNORMAL LOW (ref >60)
Glucose, Bld: 154 mg/dL — ABNORMAL HIGH (ref 70–99)
Potassium: 4.2 mmol/L (ref 3.5–5.1)
Sodium: 137 mmol/L (ref 135–145)

## 2020-11-26 LAB — PHOSPHORUS: Phosphorus: 4.2 mg/dL (ref 2.5–4.6)

## 2020-11-26 LAB — GLUCOSE, CAPILLARY
Glucose-Capillary: 120 mg/dL — ABNORMAL HIGH (ref 70–99)
Glucose-Capillary: 122 mg/dL — ABNORMAL HIGH (ref 70–99)
Glucose-Capillary: 207 mg/dL — ABNORMAL HIGH (ref 70–99)
Glucose-Capillary: 145 mg/dL — ABNORMAL HIGH (ref 70–99)
Glucose-Capillary: 199 mg/dL — ABNORMAL HIGH (ref 70–99)

## 2020-11-26 LAB — HEPARIN LEVEL (UNFRACTIONATED)
Heparin Unfractionated: 0.25 IU/mL — ABNORMAL LOW (ref 0.30–0.70)
Heparin Unfractionated: 0.31 IU/mL (ref 0.30–0.70)

## 2020-11-26 MED ORDER — AMLODIPINE BESYLATE 5 MG PO TABS: 5.0000 mg | ORAL_TABLET | Freq: Every day | ORAL | Status: DC

## 2020-11-26 MED ORDER — SODIUM CHLORIDE 0.9 % IV SOLN
2.0000 g | INTRAVENOUS | Status: DC
  Administered 2020-11-26 – 2020-11-27 (×2): 2 g via INTRAVENOUS
  Filled 2020-11-26 (×2): qty 20

## 2020-11-26 MED ORDER — AMLODIPINE BESYLATE 5 MG PO TABS
5.0000 mg | ORAL_TABLET | Freq: Once | ORAL | Status: AC
  Administered 2020-11-26: 5 mg

## 2020-11-26 MED ORDER — AMLODIPINE BESYLATE 5 MG PO TABS
5.0000 mg | ORAL_TABLET | Freq: Once | ORAL | Status: DC
  Filled 2020-11-26: qty 1

## 2020-11-26 MED ORDER — GLYCOPYRROLATE 0.2 MG/ML IJ SOLN
0.2000 mg | INTRAMUSCULAR | Status: DC | PRN
  Administered 2020-11-26 – 2020-11-28 (×6): 0.2 mg via INTRAVENOUS
  Filled 2020-11-26 (×6): qty 1

## 2020-11-26 MED ORDER — AMLODIPINE BESYLATE 10 MG PO TABS
10.0000 mg | ORAL_TABLET | Freq: Every day | ORAL | Status: DC
  Administered 2020-11-27: 10 mg
  Filled 2020-11-26: qty 1

## 2020-11-26 MED ORDER — VALPROIC ACID 250 MG/5ML PO SOLN
250.0000 mg | Freq: Two times a day (BID) | ORAL | Status: DC
  Administered 2020-11-26 – 2020-11-27 (×4): 250 mg
  Filled 2020-11-26 (×4): qty 5

## 2020-11-26 MED ORDER — NICARDIPINE HCL IN NACL 40-0.83 MG/200ML-% IV SOLN
3.0000 mg/h | INTRAVENOUS | Status: DC
  Administered 2020-11-26 (×4): 15 mg/h via INTRAVENOUS
  Administered 2020-11-27: 7.5 mg/h via INTRAVENOUS
  Administered 2020-11-27: 7 mg/h via INTRAVENOUS
  Administered 2020-11-27: 10 mg/h via INTRAVENOUS
  Administered 2020-11-28: 5 mg/h via INTRAVENOUS
  Filled 2020-11-26 (×12): qty 200

## 2020-11-26 MED ORDER — HEPARIN BOLUS VIA INFUSION
4000.0000 [IU] | Freq: Once | INTRAVENOUS | Status: AC
  Administered 2020-11-26: 4000 [IU] via INTRAVENOUS
  Filled 2020-11-26: qty 4000

## 2020-11-26 NOTE — Procedures (Signed)
Patient Name: Sharon Norris  MRN: [id number]  Epilepsy Attending: Lora Havens  Referring Physician/Provider: Montey Hora, PA Date: 11/26/2020 Duration: 26.17 mins   Patient history: 65yo F s/p cardiac arrest. EEG to evaluate for seizure   Level of alertness:  comatose   AEDs during EEG study: Propofol, LEV   Technical aspects: This EEG study was done with scalp electrodes positioned according to the 10-20 International system of electrode placement. Electrical activity was acquired at a sampling rate of '500Hz'$  and reviewed with a high frequency filter of '70Hz'$  and a low frequency filter of '1Hz'$ . EEG data were recorded continuously and digitally stored.    Description: EEG showed continuous generalized low amplitude 2-3 Hz delta slowing. EEG was reactive noxious stimulation. Hyperventilation and photic stimulation were not performed.     Of note, study was technically difficult due to significant myogenic artifact.   ABNORMALITY - Continuous slow, generalized   IMPRESSION: This technically difficult study is suggestive of severe diffuse encephalopathy, nonspecific etiology but likely related to sedation,  anoxic/hypoxic brain injury. No  definite seizures or epileptiform discharges were seen throughout the recording.    Cordon Gassett Barbra Sarks

## 2020-11-26 NOTE — Progress Notes (Signed)
Progress Note  Patient Name: Sharon Norris Date of Encounter: 11/26/2020  Reader Cardiologist: None   Subjective   No sign of neurological recovery so far, despite reducing sedation. No arrhythmia  Inpatient Medications    Scheduled Meds:  abacavir-dolutegravir-lamiVUDine  1 tablet Oral Daily   acetaminophen (TYLENOL) oral liquid 160 mg/5 mL  650 mg Per Tube Q4H   amLODipine  5 mg Per Tube Daily   aspirin  81 mg Per Tube Daily   atorvastatin  40 mg Per Tube Daily   carvedilol  6.25 mg Per Tube BID WC   chlorhexidine gluconate (MEDLINE KIT)  15 mL Mouth Rinse BID   Chlorhexidine Gluconate Cloth  6 each Topical Q0600   Chlorhexidine Gluconate Cloth  6 each Topical Daily   docusate  100 mg Per Tube BID   feeding supplement (PROSource TF)  45 mL Per Tube TID   fentaNYL (SUBLIMAZE) injection  100 mcg Intravenous Once   heparin  4,000 Units Intravenous Once   insulin aspart  0-15 Units Subcutaneous Q4H   mouth rinse  15 mL Mouth Rinse 10 times per day   pantoprazole sodium  40 mg Per Tube QHS   polyethylene glycol  17 g Per Tube Daily   sodium chloride flush  10-40 mL Intracatheter Q12H   thiamine  100 mg Per Tube Daily   Continuous Infusions:  sodium chloride     feeding supplement (VITAL 1.5 CAL) 60 mL/hr at 11/25/20 1800   fentaNYL infusion INTRAVENOUS 100 mcg/hr (11/26/20 0700)   heparin 1,100 Units/hr (11/26/20 0700)   niCARDipine 15 mg/hr (11/26/20 0826)   propofol (DIPRIVAN) infusion 20 mcg/kg/min (11/26/20 0827)   sodium bicarbonate 150 mEq in D5W infusion 50 mL/hr at 11/26/20 0823   PRN Meds: Place/Maintain arterial line **AND** sodium chloride, docusate, fentaNYL, glycopyrrolate, polyethylene glycol, sodium chloride flush   Vital Signs    Vitals:   11/26/20 0645 11/26/20 0700 11/26/20 0715 11/26/20 0730  BP:  (!) 148/67  (!) 147/75  Pulse: 93 (!) 102 99 93  Resp: '17 17 18 17  ' Temp:  98.2 F (36.8 C)    TempSrc:  Core    SpO2: 96% 95% 93% 94%   Weight:      Height:        Intake/Output Summary (Last 24 hours) at 11/26/2020 0831 Last data filed at 11/26/2020 0700 Gross per 24 hour  Intake 4775.87 ml  Output 1475 ml  Net 3300.87 ml   Last 3 Weights 11/26/2020 11/25/2020 11/24/2020  Weight (lbs) 205 lb 11 oz 203 lb 7.8 oz 201 lb 8 oz  Weight (kg) 93.3 kg 92.3 kg 91.4 kg      Telemetry    NSR - Personally Reviewed  ECG    No new tracing - Personally Reviewed  Physical Exam  Intubated, unresponsive GEN: No acute distress.   Neck: No JVD Cardiac: RRR, no murmurs, rubs, or gallops.  Respiratory: Clear to auscultation bilaterally. GI: Soft, nontender, non-distended  MS: No edema; No deformity. Neuro:  Nonfocal, limited evaluation only is possible. Comatose  Psych: Normal affect   Labs    High Sensitivity Troponin:   Recent Labs  Lab 11/17/2020 2118 11/24/20 0025 11/24/20 0855 11/24/20 1226  TROPONINIHS 743* 1,799* >24,000* >24,000*     Chemistry Recent Labs  Lab 11/25/20 0417 11/25/20 0433 11/25/20 1422 11/25/20 1446 11/25/20 2051 11/25/20 2056 11/26/20 0413 11/26/20 0455 11/26/20 0614  NA 138   < > 138   < > 136   < >  137 138 137  K 4.5   < > 4.6   < > 4.2   < > 4.2 4.2 4.2  CL 114*  --  114*  --  111  --  108  --   --   CO2 15*  --  16*  --  17*  --  20*  --   --   GLUCOSE 110*  --  97  --  156*  --  154*  --   --   BUN 19  --  17  --  19  --  20  --   --   CREATININE 1.53*  --  1.41*  --  1.39*  --  1.36*  --   --   CALCIUM 7.9*  --  8.0*  --  7.8*  --  7.9*  --   --   MG 2.1  --  2.1  --  2.0  --  2.2  --   --   PROT 6.6  --  6.2*  --  6.0*  --   --   --   --   ALBUMIN 2.5*  --  2.4*  --  2.3*  --   --   --   --   AST 57*  --  50*  --  50*  --   --   --   --   ALT 30  --  27  --  27  --   --   --   --   ALKPHOS 62  --  61  --  59  --   --   --   --   BILITOT 0.6  --  0.5  --  0.3  --   --   --   --   GFRNONAA 37*  --  41*  --  42*  --  43*  --   --   ANIONGAP 9  --  8  --  8  --  9  --    --    < > = values in this interval not displayed.    Lipids  Recent Labs  Lab 11/24/20 0343  CHOL 167  TRIG 208*  HDL 36*  LDLCALC 89  CHOLHDL 4.6    Hematology Recent Labs  Lab 11/24/20 0343 11/24/20 1237 11/25/20 0417 11/25/20 0433 11/26/20 0413 11/26/20 0455 11/26/20 0614  WBC 13.1*  --  13.8*  --  11.2*  --   --   RBC 3.54*  --  3.36*  --  3.10*  --   --   HGB 11.5*   < > 10.9*   < > 9.9* 9.5* 10.5*  HCT 34.1*   < > 33.7*   < > 30.9* 28.0* 31.0*  MCV 96.3  --  100.3*  --  99.7  --   --   MCH 32.5  --  32.4  --  31.9  --   --   MCHC 33.7  --  32.3  --  32.0  --   --   RDW 13.2  --  13.8  --  13.9  --   --   PLT 184  --  166  --  156  --   --    < > = values in this interval not displayed.   Thyroid  Recent Labs  Lab 11/25/20 1051  TSH 3.105    BNPNo results for input(s): BNP, PROBNP in the last 168 hours.  DDimer No  results for input(s): DDIMER in the last 168 hours.   Radiology    DG Abd Portable 1V  Result Date: 11/25/2020 CLINICAL DATA:  Feeding tube placement. EXAM: PORTABLE ABDOMEN - 1 VIEW COMPARISON:  None. FINDINGS: An enteric tube terminates in the midline of the central abdomen at the level of the mid to distal gastric body. No dilated loops of bowel are seen in the included portion of the abdomen. Mild bibasilar lung opacities likely reflect atelectasis. No acute osseous abnormality is seen. IMPRESSION: Enteric tube in the stomach. Electronically Signed   By: Logan Bores M.D.   On: 11/25/2020 10:16   EEG adult  Result Date: 11/24/2020 Lora Havens, MD     11/24/2020  1:45 PM Patient Name: Sharon Norris MRN: [id number] Epilepsy Attending: Lora Havens Referring Physician/Provider: Dr June Leap Date: 11/24/2020 Duration: 36.05 mins Patient history: 65yo F s/p cardiac arrest. EEG to evaluate for seizure Level of alertness:  comatose AEDs during EEG study: Propofol Technical aspects: This EEG study was done with scalp electrodes positioned  according to the 10-20 International system of electrode placement. Electrical activity was acquired at a sampling rate of '500Hz'  and reviewed with a high frequency filter of '70Hz'  and a low frequency filter of '1Hz' . EEG data were recorded continuously and digitally stored. Description: EEG showed continuous generalized low amplitude 2-3 Hz delta slowing. Generalized Spike and polyspikes were noted which were quasi-periodic every 1-3 seconds. Eeg was reactive noxious stimulation. Hyperventilation and photic stimulation were not performed.   ABNORMALITY - Polyspikes, generalized - Continuous slow, generalized IMPRESSION: This study showed evidence of epileptogenicity with generalized onset as well as severe diffuse encephalopathy, nonspecific etiology but likely related to sedation,  anoxic/hypoxic brain injury. No  definite seizures were seen throughout the recording. If suspicion for ictal- interictal activity remains a concern, a prolonged study can be considered. Lora Havens   ECHOCARDIOGRAM COMPLETE  Result Date: 11/24/2020    ECHOCARDIOGRAM REPORT   Patient Name:   Sharon Norris Date of Exam: 11/24/2020 Medical Rec #:  [id number]      Height:       66.0 in Accession #:    [id number]     Weight:       201.5 lb Date of Birth:  [birth year]       BSA:          2.006 m Patient Age:    65 years       BP:           118/60 mmHg Patient Gender: F              HR:           71 bpm. Exam Location:  Inpatient Procedure: 2D Echo Indications:     acute myocardial infarction  History:         Patient has prior history of Echocardiogram examinations, most                  recent 11/21/2019. CAD, chronic kidney disease. HIV.; Risk                  Factors:Hypertension.  Sonographer:     Johny Chess Referring Phys:  [id number] NGEXB A RICHARDS Diagnosing Phys: Oswaldo Milian MD  Sonographer Comments: Echo performed with patient supine and on artificial respirator. IMPRESSIONS  1. Left ventricular ejection fraction, by  estimation, is 60 to 65%. The left ventricle has normal function. The left ventricle has no regional wall  motion abnormalities. There is severe left ventricular hypertrophy. Left ventricular diastolic parameters  are consistent with Grade II diastolic dysfunction (pseudonormalization). Elevated left atrial pressure. LVOT flow acceleration.  2. Right ventricular systolic function is normal. The right ventricular size is normal. There is moderately elevated pulmonary artery systolic pressure. The estimated right ventricular systolic pressure is 75.1 mmHg.  3. The mitral valve is normal in structure. Trivial mitral valve regurgitation.  4. The aortic valve is tricuspid. Aortic valve regurgitation is not visualized. Mild aortic valve sclerosis is present, with no evidence of aortic valve stenosis.  5. The inferior vena cava is normal in size with greater than 50% respiratory variability, suggesting right atrial pressure of 3 mmHg. FINDINGS  Left Ventricle: Left ventricular ejection fraction, by estimation, is 60 to 65%. The left ventricle has normal function. The left ventricle has no regional wall motion abnormalities. The left ventricular internal cavity size was normal in size. There is  severe left ventricular hypertrophy. Left ventricular diastolic parameters are consistent with Grade II diastolic dysfunction (pseudonormalization). Elevated left atrial pressure. Right Ventricle: The right ventricular size is normal. No increase in right ventricular wall thickness. Right ventricular systolic function is normal. There is moderately elevated pulmonary artery systolic pressure. The tricuspid regurgitant velocity is 3.37 m/s, and with an assumed right atrial pressure of 3 mmHg, the estimated right ventricular systolic pressure is 70.0 mmHg. Left Atrium: Left atrial size was normal in size. Right Atrium: Right atrial size was normal in size. Pericardium: There is no evidence of pericardial effusion. Mitral Valve: The  mitral valve is normal in structure. Trivial mitral valve regurgitation. Tricuspid Valve: The tricuspid valve is normal in structure. Tricuspid valve regurgitation is mild. Aortic Valve: The aortic valve is tricuspid. Aortic valve regurgitation is not visualized. Mild aortic valve sclerosis is present, with no evidence of aortic valve stenosis. Pulmonic Valve: The pulmonic valve was not well visualized. Pulmonic valve regurgitation is trivial. Aorta: The aortic root and ascending aorta are structurally normal, with no evidence of dilitation. Venous: The inferior vena cava is normal in size with greater than 50% respiratory variability, suggesting right atrial pressure of 3 mmHg. IAS/Shunts: The interatrial septum was not well visualized.  LEFT VENTRICLE PLAX 2D LVIDd:         4.30 cm     Diastology LVIDs:         2.30 cm     LV e' medial:    5.11 cm/s LV PW:         1.40 cm     LV E/e' medial:  21.5 LV IVS:        1.00 cm     LV e' lateral:   5.55 cm/s LVOT diam:     1.60 cm     LV E/e' lateral: 19.8 LVOT Area:     2.01 cm  LV Volumes (MOD) LV vol d, MOD A2C: 58.1 ml LV vol s, MOD A2C: 28.0 ml LV SV MOD A2C:     30.1 ml RIGHT VENTRICLE             IVC RV S prime:     13.30 cm/s  IVC diam: 1.10 cm TAPSE (M-mode): 1.8 cm LEFT ATRIUM             Index        RIGHT ATRIUM          Index LA diam:        4.20 cm 2.09 cm/m   RA Area:  9.76 cm LA Vol (A2C):   44.3 ml 22.08 ml/m  RA Volume:   18.70 ml 9.32 ml/m LA Vol (A4C):   59.6 ml 29.71 ml/m LA Biplane Vol: 54.6 ml 27.22 ml/m   AORTA Ao Asc diam: 2.70 cm MITRAL VALVE                TRICUSPID VALVE MV Area (PHT): 2.66 cm     TR Peak grad:   45.4 mmHg MV Decel Time: 285 msec     TR Vmax:        337.00 cm/s MV E velocity: 110.00 cm/s MV A velocity: 137.00 cm/s  SHUNTS MV E/A ratio:  0.80         Systemic Diam: 1.60 cm Oswaldo Milian MD Electronically signed by Oswaldo Milian MD Signature Date/Time: 11/24/2020/2:47:14 PM    Final (Updated)     Cardiac  Studies   ECHO 11/24/2020 1. Left ventricular ejection fraction, by estimation, is 60 to 65%. The left ventricle has normal function. The left ventricle has no regional wall motion abnormalities. There is severe left ventricular hypertrophy. Left ventricular diastolic parameters  are consistent with Grade II diastolic dysfunction (pseudonormalization). Elevated left atrial pressure. LVOT flow acceleration.   2. Right ventricular systolic function is normal. The right ventricular size is normal. There is moderately elevated pulmonary artery systolic pressure. The estimated right ventricular systolic pressure is 52.1 mmHg.   3. The mitral valve is normal in structure. Trivial mitral valve regurgitation.   4. The aortic valve is tricuspid. Aortic valve regurgitation is not visualized. Mild aortic valve sclerosis is present, with no evidence of aortic valve stenosis.   5. The inferior vena cava is normal in size with greater than 50% respiratory variability, suggesting right atrial pressure of 3 mmHg.     Patient Profile     65 y.o. female with PEA arrest 11/24/2020, preceded by severe dyspnea and multiple previous presentations with symptoms of unstable angina and abnormal cardiac enzymes, never receiving full workup due to leaving AMA. Background problems include IV drug use, major depression, previous stroke, HTN, hypercholesterolemia, former smoker, HIV, neuropathy. UDS + cocaine on multiple evaluations, including on this admission.  Assessment & Plan    Hemodynamically stable and no arrhythmia, but without any sign of neuro recovery. Prognosis is grim. Barring a significant neuro recovery, no plan for additional cardiology workup. Will follow as long as primary team thinks we can be of service.    For questions or updates, please contact [facility] Please consult [email or web address] for contact info under        Signed, Sanda Klein, MD  11/26/2020, 8:31 AM   prog

## 2020-11-26 NOTE — Progress Notes (Signed)
[facility] Progress Note Patient Name: Sharon Norris DOB: 03/14/1955 MRN: [id number]   Date of Service  11/26/2020  HPI/Events of Note  Sputum notes gram (+) cocci and sputum is foul smelling.  Pt also had a slight leukocytosis when last checked.    eICU Interventions  Adding rocephin 2g q24hr iv.  Await sputum cult reports.  If fever develops, leukocytosis worsens, or pt deteriorates clinically, we would then add empiric MRSA coverage as well.  For now will await culture data     Intervention Category Intermediate Interventions: Infection - evaluation and management  Tilden Dome 11/26/2020, 7:39 PM

## 2020-11-26 NOTE — Progress Notes (Signed)
CSW received consult for POA assist. CSW spoke with patients son and patients daughter Sharon Norris at bedside. Tiffany confirmed no request made for assist with POA/HCPOA. No further questions at this time.

## 2020-11-26 NOTE — Progress Notes (Signed)
Bilateral lower extremity venous duplex completed. Refer to "CV Proc" under chart review to view preliminary results.  11/26/2020 4:53 PM Kelby Aline., MHA, RVT, RDCS, RDMS

## 2020-11-26 NOTE — Progress Notes (Addendum)
NAME:  Sharon Norris, MRN:  [id number], DOB:  November 08, 1955, LOS: 3 ADMISSION DATE:  11/30/2020, CONSULTATION DATE:  11/27/2020 REFERRING MD:  Little Ishikawa, MD, CHIEF COMPLAINT:  PEA cardiac arrest   History of Present Illness:  [name] is a 65 year old lady with PMH significant for IVDA, hepatitis C, hypertension among other conditions.  It was reported that the patient had a witnessed cardiac arrest at home.  There were no bystander CPR. EMS arrived and found patient in PEA. POSC was archived after a total of 20 minutes of ACLS protocol.  Patient had transient ROSC at 10 minutes but lost pulses but regained it after a second 10 minutes of ACLS. Protocol.  She was intubated in the ED as she arrived with a [facility] airway.  As per the patient's grand daughter, the patient had complained of chest pain intermittently the day prior to presentation.    Pertinent  Medical History  IV cocaine abuse HIV disease hypertension  Significant Hospital Events: Including procedures, antibiotic start and stop dates in addition to other pertinent events   12/13/2020 Intubation; right femoral TLC; Left radial arterial line 10/9 Echo > EF 60-65%, severe LVH, G2DD. 10/11 > intermittent eye twitching / opening, EEG ordered   Subjective   intermittent eye twitching / opening, EEG ordered. Copious nasal and oral secretions, foul odor. Sedation weaned, currently only on 100 fentanyl.  Remains unresponsive. MRI ordered  Objective   Blood pressure (!) 147/75, pulse 93, temperature 98.2 F (36.8 C), temperature source Core, resp. rate 17, height '5\' 6"'$  (1.676 m), weight 93.3 kg, SpO2 94 %.    Vent Mode: PRVC FiO2 (%):  [40 %-50 %] 50 % Set Rate:  [24 bmp-30 bmp] 30 bmp Vt Set:  [470 mL] 470 mL PEEP:  [5 cmH20] 5 cmH20 Plateau Pressure:  [24 cmH20-30 cmH20] 28 cmH20   Intake/Output Summary (Last 24 hours) at 11/26/2020 0757 Last data filed at 11/26/2020 0700 Gross per 24 hour  Intake 4775.87 ml  Output  1575 ml  Net 3200.87 ml    Filed Weights   11/24/20 0329 11/25/20 0400 11/26/20 0400  Weight: 91.4 kg 92.3 kg 93.3 kg    Examination: General: obese fm, intubated, critically ill HENT: NCAT, MMM Lungs: CTAB, BL vented breaths  Cardiovascular: RRR, no M/R/G Abdomen: obese, soft nt nd  Extremities: no edema  Neuro: Sedated, not responsive GU: foley    Assessment & Plan:   PEA Cardiac Arrest - Suspect cocaine related  Plan: Normothermia  Continue supportive care Get EEG and MRI Neuroprognostication in next day or two, have informed grand daughter to prepare family for poor prognosis and encouraged them to start discussions regarding their wishes in the event that pt does not improve over next few days  Acute NSTEMI vs supply demand mismatch - Possibly cocaine related/induced  Plan:  Continue Heparin infusion , ASA, Atorvastatin, Carvedilol Appreciate cards assistance  Malignant hypertension - CT head negative Plan:  Continue nicardipine infusion and wean, goal SBP ~140 Continue oral amlodipine + HCTZ  Acute hypoxic and hypercarbic respiratory failure secondary  P: Full vent support  Increase RR to 32 Wean as tolerated when sedation lowered and mental status improves Spo2 goal >90%  Add Robinul for secretions  Acute kidney injury superimposed on CKD 3b NAGMA Plan:  Continue HCO3 gtt Follow UOP, BMP  Acute metabolic encephalopathy  Hx of substance abuse with positive toxicology screen for cocaine, unclear if alcohol plays an role in here  P;  Get EEG and MRI today Discussed poor prognosis with grand daughter and encouraged her to start discussions with family regarding their wishes if she does not show any significant improvement in the next few dyas Observe for withdrawal symptoms   Leukocytosis - post arrest, suspect reactive though at risk for aspiration.  Does have fairly foul smelling nasal and oral secretions, thick and white P: Send sputum / secretions  for culture Hold abx for now  Hx HIV disease, hepatitis C P: OP follow up     Best Practice (right click and "Reselect all SmartList Selections" daily)   Diet/type: tubefeeds DVT prophylaxis: systemic heparin GI prophylaxis: PPI Lines: Central line, Arterial line Foley:  Yes, and it is still needed Code Status:  full code Last date of multidisciplinary goals of care discussion   CC time: 35 min.   Montey Hora, [facility] For pager details, please see AMION or use Epic chat  After 1900, please call [facility] for cross coverage needs 11/26/2020, 7:57 AM

## 2020-11-26 NOTE — Progress Notes (Signed)
[place] for Heparin  Indication: chest pain/ACS, s/p cardiac arrest with ROSC  Allergies  Allergen Reactions   Benadryl [Diphenhydramine Hcl] Hypertension   Ribavirin Rash   Viekira Pak [Ombitasvir-Paritaprevir-Ritonavir-Dasabuvir] Hives    Patient Measurements: Height: '5\' 6"'$  (167.6 cm) Weight: 93.3 kg (205 lb 11 oz) (reweighed multiple times. weight of TTM pads subtracted) IBW/kg (Calculated) : 59.3 Heparin dosing weight: 80kg  Vital Signs: Temp: 97.7 F (36.5 C) (10/11 0600) Temp Source: Core (10/11 0400) BP: 148/71 (10/11 0530) Pulse Rate: 93 (10/11 0545)  Labs: Recent Labs    12/13/2020 2115 12/15/2020 2118 11/24/20 0025 11/24/20 0333 11/24/20 0343 11/24/20 0855 11/24/20 0953 11/24/20 1226 11/24/20 1237 11/24/20 2018 11/24/20 2033 11/25/20 0417 11/25/20 0433 11/25/20 0651 11/25/20 1422 11/25/20 1446 11/25/20 2051 11/25/20 2056 11/26/20 0413 11/26/20 0455 11/26/20 0614  HGB  --    < >  --    < > 11.5*  --   --   --    < >  --    < > 10.9*   < >  --   --    < >  --    < > 9.9* 9.5* 10.5*  HCT  --    < >  --    < > 34.1*  --   --   --    < >  --    < > 33.7*   < >  --   --    < >  --    < > 30.9* 28.0* 31.0*  PLT  --    < >  --   --  184  --   --   --   --   --   --  166  --   --   --   --   --   --  156  --   --   APTT 28  --   --   --   --   --   --   --   --   --   --   --   --   --   --   --   --   --   --   --   --   HEPARINUNFRC  --   --   --   --   --   --    < >  --   --  0.37  --   --   --  0.39  --   --   --   --  0.25*  --   --   CREATININE  --    < >  --   --  1.49*  --   --  1.50*  --  1.55*  --  1.53*  --   --  1.41*  --  1.39*  --  1.36*  --   --   TROPONINIHS  --    < > 1,799*  --   --  >24,000*  --  >24,000*  --   --   --   --   --   --   --   --   --   --   --   --   --    < > = values in this interval not displayed.     Estimated Creatinine Clearance: 46.2 mL/min (A) (by C-G formula based on SCr of 1.36  mg/dL (H)).   Medical History: Past  Medical History:  Diagnosis Date   Anxiety    Bronchitis    Depression    Excessive cerumen in both ear canals 07/02/2017   H/O drug abuse ([place])    IV drugs, drug free 12 years   Hepatitis    Hep. C   History of suicidal ideation    thought about overdose in the past   HIV infection ([place])    Hypertension    Neuropathy    Sleep difficulties    Trichomonal infection    2009    Assessment: 65 y/o F s/p out of hospital cardiac arrest with ROSC. She is on  heparin per pharmacy for r/o ACS. Currently on TTM protocol.   Heparin level subtherapeutic at 0.25 on 1100 units/hr. CBC is stable. No s/x of bleeding per RN.   Goal of Therapy:  Heparin level 0.3-0.7 units/ml Monitor platelets by anticoagulation protocol: Yes   Plan:  -Bolus 4000 units of heparin -Increase heparin to 1250 units/hr -HL in 8 hours -Daily heparin level and CBC  Cathrine Muster, PharmD PGY2 Cardiology Pharmacy Resident Phone: [phone] 11/26/2020  7:38 AM  Please check [email or web address] for unit-specific pharmacy phone numbers.

## 2020-11-26 NOTE — Progress Notes (Signed)
EEG complete - results pending 

## 2020-11-26 NOTE — Progress Notes (Signed)
[facility] for Heparin  Indication: chest pain/ACS, s/p cardiac arrest with ROSC  Allergies  Allergen Reactions   Benadryl [Diphenhydramine Hcl] Hypertension   Ribavirin Rash   Viekira Pak [Ombitasvir-Paritaprevir-Ritonavir-Dasabuvir] Hives    Patient Measurements: Height: '5\' 6"'$  (167.6 cm) Weight: 93.3 kg (205 lb 11 oz) (reweighed multiple times. weight of TTM pads subtracted) IBW/kg (Calculated) : 59.3 Heparin dosing weight: 80kg  Vital Signs: Temp: 98.2 F (36.8 C) (10/11 1700) Temp Source: Core (10/11 1700) BP: 119/88 (10/11 1700) Pulse Rate: 88 (10/11 1700)  Labs: Recent Labs    12/04/2020 2115 11/30/2020 2118 11/24/20 0025 11/24/20 0333 11/24/20 0343 11/24/20 0855 11/24/20 0953 11/24/20 1226 11/24/20 1237 11/25/20 0417 11/25/20 0433 11/25/20 0651 11/25/20 1422 11/25/20 1446 11/25/20 2051 11/25/20 2056 11/26/20 0413 11/26/20 0455 11/26/20 0614 11/26/20 1607  HGB  --    < >  --    < > 11.5*  --   --   --    < > 10.9*   < >  --   --    < >  --    < > 9.9* 9.5* 10.5*  --   HCT  --    < >  --    < > 34.1*  --   --   --    < > 33.7*   < >  --   --    < >  --    < > 30.9* 28.0* 31.0*  --   PLT  --    < >  --   --  184  --   --   --   --  166  --   --   --   --   --   --  156  --   --   --   APTT 28  --   --   --   --   --   --   --   --   --   --   --   --   --   --   --   --   --   --   --   HEPARINUNFRC  --   --   --   --   --   --    < >  --    < >  --   --  0.39  --   --   --   --  0.25*  --   --  0.31  CREATININE  --    < >  --   --  1.49*  --   --  1.50*   < > 1.53*  --   --  1.41*  --  1.39*  --  1.36*  --   --   --   TROPONINIHS  --    < > 1,799*  --   --  >24,000*  --  >24,000*  --   --   --   --   --   --   --   --   --   --   --   --    < > = values in this interval not displayed.     Estimated Creatinine Clearance: 46.2 mL/min (A) (by C-G formula based on SCr of 1.36 mg/dL (H)).   Medical History: Past Medical  History:  Diagnosis Date   Anxiety    Bronchitis    Depression    Excessive cerumen  in both ear canals 07/02/2017   H/O drug abuse (HCC)    IV drugs, drug free 12 years   Hepatitis    Hep. C   History of suicidal ideation    thought about overdose in the past   HIV infection ([place])    Hypertension    Neuropathy    Sleep difficulties    Trichomonal infection    2009    Assessment: 65 y/o F s/p out of hospital cardiac arrest with ROSC. She is on  heparin per pharmacy for r/o ACS. Currently on TTM protocol.   Heparin therapeutic but on the low end after a bolus and rate increase earlier today (HL 0.31, goal of 0.3-0.7). No bleeding or infusion issues noted per discussion with RN. Will increase to keep within range.   Goal of Therapy:  Heparin level 0.3-0.7 units/ml Monitor platelets by anticoagulation protocol: Yes   Plan:  - Increase Heparin to 1350 units/hr (13.5 ml/hr) to keep within range - Will continue to monitor for any signs/symptoms of bleeding and will follow up with heparin level in 6 hours to confirm therapeutic  Thank you for allowing pharmacy to be a part of this patient's care.  Alycia Rossetti, PharmD, BCPS Clinical Pharmacist Clinical phone for 11/26/2020: [phone] 11/26/2020 5:55 PM   **Pharmacist phone directory can now be found on [email or web address] ([email or web address]).  Listed under [place].

## 2020-11-27 ENCOUNTER — Inpatient Hospital Stay (HOSPITAL_COMMUNITY): Payer: Medicare Other

## 2020-11-27 DIAGNOSIS — F1410 Cocaine abuse, uncomplicated: Secondary | ICD-10-CM

## 2020-11-27 DIAGNOSIS — J9690 Respiratory failure, unspecified, unspecified whether with hypoxia or hypercapnia: Secondary | ICD-10-CM

## 2020-11-27 DIAGNOSIS — I469 Cardiac arrest, cause unspecified: Secondary | ICD-10-CM | POA: Diagnosis not present

## 2020-11-27 LAB — CULTURE, BLOOD (ROUTINE X 2)

## 2020-11-27 LAB — BASIC METABOLIC PANEL
Anion gap: 12 (ref 5–15)
BUN: 20 mg/dL (ref 8–23)
CO2: 19 mmol/L — ABNORMAL LOW (ref 22–32)
Calcium: 7.8 mg/dL — ABNORMAL LOW (ref 8.9–10.3)
Chloride: 105 mmol/L (ref 98–111)
Creatinine, Ser: 1.34 mg/dL — ABNORMAL HIGH (ref 0.44–1.00)
GFR, Estimated: 43 mL/min — ABNORMAL LOW (ref >60)
Glucose, Bld: 231 mg/dL — ABNORMAL HIGH (ref 70–99)
Potassium: 3.8 mmol/L (ref 3.5–5.1)
Sodium: 136 mmol/L (ref 135–145)

## 2020-11-27 LAB — CBC
HCT: 27.6 % — ABNORMAL LOW (ref 36.0–46.0)
Hemoglobin: 9.2 g/dL — ABNORMAL LOW (ref 12.0–15.0)
MCH: 32.7 pg (ref 26.0–34.0)
MCHC: 33.3 g/dL (ref 30.0–36.0)
MCV: 98.2 fL (ref 80.0–100.0)
Platelets: 165 10*3/uL (ref 150–400)
RBC: 2.81 MIL/uL — ABNORMAL LOW (ref 3.87–5.11)
RDW: 13.7 % (ref 11.5–15.5)
WBC: 10.1 10*3/uL (ref 4.0–10.5)
nRBC: 0.2 % (ref 0.0–0.2)

## 2020-11-27 LAB — POCT I-STAT 7, (LYTES, BLD GAS, ICA,H+H)
Acid-base deficit: 3 mmol/L — ABNORMAL HIGH (ref 0.0–2.0)
Bicarbonate: 20.4 mmol/L (ref 20.0–28.0)
Calcium, Ion: 1.1 mmol/L — ABNORMAL LOW (ref 1.15–1.40)
HCT: 27 % — ABNORMAL LOW (ref 36.0–46.0)
Hemoglobin: 9.2 g/dL — ABNORMAL LOW (ref 12.0–15.0)
O2 Saturation: 80 %
Patient temperature: 36.6
Potassium: 3.7 mmol/L (ref 3.5–5.1)
Sodium: 138 mmol/L (ref 135–145)
TCO2: 21 mmol/L — ABNORMAL LOW (ref 22–32)
pCO2 arterial: 30 mmHg — ABNORMAL LOW (ref 32.0–48.0)
pH, Arterial: 7.439 (ref 7.350–7.450)
pO2, Arterial: 41 mmHg — ABNORMAL LOW (ref 83.0–108.0)

## 2020-11-27 LAB — GLUCOSE, CAPILLARY
Glucose-Capillary: 109 mg/dL — ABNORMAL HIGH (ref 70–99)
Glucose-Capillary: 130 mg/dL — ABNORMAL HIGH (ref 70–99)
Glucose-Capillary: 141 mg/dL — ABNORMAL HIGH (ref 70–99)
Glucose-Capillary: 160 mg/dL — ABNORMAL HIGH (ref 70–99)
Glucose-Capillary: 162 mg/dL — ABNORMAL HIGH (ref 70–99)
Glucose-Capillary: 178 mg/dL — ABNORMAL HIGH (ref 70–99)
Glucose-Capillary: 186 mg/dL — ABNORMAL HIGH (ref 70–99)

## 2020-11-27 LAB — TRIGLYCERIDES: Triglycerides: 385 mg/dL — ABNORMAL HIGH (ref <150)

## 2020-11-27 LAB — MAGNESIUM: Magnesium: 2.1 mg/dL (ref 1.7–2.4)

## 2020-11-27 LAB — PHOSPHORUS: Phosphorus: 3 mg/dL (ref 2.5–4.6)

## 2020-11-27 MED ORDER — POTASSIUM CHLORIDE 20 MEQ PO PACK
40.0000 meq | PACK | Freq: Once | ORAL | Status: AC
  Administered 2020-11-27: 40 meq
  Filled 2020-11-27: qty 2

## 2020-11-27 MED ORDER — ALBUTEROL SULFATE (2.5 MG/3ML) 0.083% IN NEBU: 2.5000 mg | INHALATION_SOLUTION | Freq: Four times a day (QID) | RESPIRATORY_TRACT | Status: DC | PRN

## 2020-11-27 MED ORDER — ENOXAPARIN SODIUM 40 MG/0.4ML IJ SOSY
40.0000 mg | PREFILLED_SYRINGE | INTRAMUSCULAR | Status: DC
  Administered 2020-11-27: 40 mg via SUBCUTANEOUS
  Filled 2020-11-27: qty 0.4

## 2020-11-27 MED ORDER — GADOBUTROL 1 MMOL/ML IV SOLN
10.0000 mL | Freq: Once | INTRAVENOUS | Status: AC | PRN
  Administered 2020-11-27: 10 mL via INTRAVENOUS

## 2020-11-27 MED ORDER — INSULIN ASPART 100 UNIT/ML IJ SOLN
0.0000 [IU] | INTRAMUSCULAR | Status: DC
  Administered 2020-11-27: 4 [IU] via SUBCUTANEOUS
  Administered 2020-11-27 (×2): 3 [IU] via SUBCUTANEOUS
  Administered 2020-11-27: 4 [IU] via SUBCUTANEOUS
  Administered 2020-11-28: 3 [IU] via SUBCUTANEOUS
  Administered 2020-11-28: 4 [IU] via SUBCUTANEOUS

## 2020-11-27 NOTE — Progress Notes (Signed)
NAME:  Sharon Norris, MRN:  [id number], DOB:  1955/03/23, LOS: 4 ADMISSION DATE:  11/30/2020, CONSULTATION DATE:  11/16/2020 REFERRING MD:  Little Ishikawa, MD, CHIEF COMPLAINT:  PEA cardiac arrest   History of Present Illness:  [name] is a 65 year old lady with PMH significant for IVDA, hepatitis C, hypertension among other conditions.  It was reported that the patient had a witnessed cardiac arrest at home.  There were no bystander CPR. EMS arrived and found patient in PEA. POSC was archived after a total of 20 minutes of ACLS protocol.  Patient had transient ROSC at 10 minutes but lost pulses but regained it after a second 10 minutes of ACLS. Protocol.  She was intubated in the ED as she arrived with a [facility] airway.  As per the patient's grand daughter, the patient had complained of chest pain intermittently the day prior to presentation.    Pertinent  Medical History  IV cocaine abuse HIV disease hypertension  Significant Hospital Events: Including procedures, antibiotic start and stop dates in addition to other pertinent events   11/22/2020 Intubation; right femoral TLC; Left radial arterial line 10/9 Echo > EF 60-65%, severe LVH, G2DD. 10/11 > intermittent eye twitching / opening, EEG ordered   Subjective   Respiratory culture growing gm + cocci, Rocephin started MRI with multi-focal bihemispheric cortical diffusion restriction EEG consistent with severe diffuse slowing   Objective   Blood pressure (!) 128/59, pulse 92, temperature 99.1 F (37.3 C), resp. rate 10, height '5\' 6"'$  (1.676 m), weight 97.2 kg, SpO2 97 %.    Vent Mode: PRVC FiO2 (%):  [50 %] 50 % Set Rate:  [32 bmp] 32 bmp Vt Set:  [470 mL] 470 mL PEEP:  [5 cmH20] 5 cmH20 Plateau Pressure:  [19 cmH20-26 cmH20] 26 cmH20   Intake/Output Summary (Last 24 hours) at 11/27/2020 [id number] Last data filed at 11/27/2020 0400 Gross per 24 hour  Intake 4826.47 ml  Output 1175 ml  Net 3651.47 ml    Filed Weights    11/25/20 0400 11/26/20 0400 11/27/20 0500  Weight: 92.3 kg 93.3 kg 97.2 kg    General:  critically ill-appearing F, intubated and sedated HEENT: MM pink/moist, ETT in place Neuro: examined on propofol and fentanyl, triggering breaths on vent, eyelids flickering to voice, withdrawing to pain bilateral UE CV: s1s2 rrr, no m/r/g PULM:  on full vent support, synchronous with vent, decreased air movement bilateral bases GI: soft, bsx4 active  Extremities: warm/dry, 1+ edema  Skin: no rashes or lesions    Assessment & Plan:   PEA Cardiac Arrest - Suspect cocaine related  Severe encephalopathy Echo with LVEF 60 to 65% and grade 2 diastolic dysfunction MRI showing multifocal bihemispheric cortical diffusion restriction consistent with anoxic injury EEG without epileptiform discharges, consistent with severe diffuse encephalopathy Hx of substance abuse with positive toxicology screen for cocaine Plan: -Continue supportive care, neuro prognostication thus far suggestive of poor prognosis -Discussed next steps with granddaughter at the bedside, will attempt SAT again today, continued family discussions.  Patient was apparently very independent and family unsure if she would want trach/PEG/SNF    Acute NSTEMI vs supply demand mismatch - Possibly cocaine related/induced  Plan:  -Continue Heparin infusion , ASA, Atorvastatin, Carvedilol -Appreciate cards assistance  Malignant hypertension  CT head negative Plan:  Continue nicardipine infusion and wean, goal SBP ~140 Continue oral amlodipine + HCTZ  Acute hypoxic and hypercarbic respiratory failure secondary to cardiac arrest Likely pneumonia Respiratory culture showing  gram-positive cocci,  P: -Continue Rocephin, await final respiratory culture results -Maintain full vent support with SAT/SBT as tolerated -titrate Vent setting to maintain SpO2 greater than or equal to 90%. -HOB elevated 30 degrees. -Plateau pressures less than 30 cm  H20.  -Follow chest x-ray, ABG prn.   -Bronchial hygiene and RT/bronchodilator protocol.  Acute kidney injury superimposed on CKD 3b NAGMA Plan:  -Acidosis improved, hold bicarb gtt. -Continue to follow UOP, BMP    Hx HIV disease, hepatitis C P: -OP follow up     Best Practice (right click and "Reselect all SmartList Selections" daily)   Diet/type: tubefeeds DVT prophylaxis: systemic heparin GI prophylaxis: PPI Lines: Central line, Arterial line Foley:  Yes, and it is still needed Code Status:  full code Last date of multidisciplinary goals of care: Discussed with granddaughter at the bedside, further discussion with family today   CRITICAL CARE Performed by: Otilio Carpen Kemaria Dedic   Total critical care time: 40 minutes  Critical care time was exclusive of separately billable procedures and treating other patients.  Critical care was necessary to treat or prevent imminent or life-threatening deterioration.  Critical care was time spent personally by me on the following activities: development of treatment plan with patient and/or surrogate as well as nursing, discussions with consultants, evaluation of patient's response to treatment, examination of patient, obtaining history from patient or surrogate, ordering and performing treatments and interventions, ordering and review of laboratory studies, ordering and review of radiographic studies, pulse oximetry and re-evaluation of patient's condition.  Otilio Carpen Dajuana Palen, PA-C [facility] See Amion for pager If no response to pager , please call [phone] until 7pm After 7:00 pm call Elink  [phone]?[place]

## 2020-11-27 NOTE — Progress Notes (Signed)
Transported pt from [place] Rm 1 to MRI and from MRI back to [facility] Rm 1. No complications noted.

## 2020-11-27 NOTE — Progress Notes (Signed)
No neurological improvement. Poor prognostic findings on cervical MRI. Family is considering withdrawal of care versus continued aggressive and invasive care with tracheostomy and PEG tube and long-term acute care facility. Available for further questions as needed, but will follow from a distance.

## 2020-11-28 DIAGNOSIS — I469 Cardiac arrest, cause unspecified: Secondary | ICD-10-CM | POA: Diagnosis not present

## 2020-11-28 LAB — CULTURE, BLOOD (ROUTINE X 2)
Culture: NO GROWTH
Special Requests: ADEQUATE

## 2020-11-28 LAB — CULTURE, RESPIRATORY W GRAM STAIN

## 2020-11-28 LAB — GLUCOSE, CAPILLARY
Glucose-Capillary: 152 mg/dL — ABNORMAL HIGH (ref 70–99)
Glucose-Capillary: 141 mg/dL — ABNORMAL HIGH (ref 70–99)

## 2020-11-28 LAB — CBC
HCT: 25.3 % — ABNORMAL LOW (ref 36.0–46.0)
Hemoglobin: 8.4 g/dL — ABNORMAL LOW (ref 12.0–15.0)
MCH: 32.6 pg (ref 26.0–34.0)
MCHC: 33.2 g/dL (ref 30.0–36.0)
MCV: 98.1 fL (ref 80.0–100.0)
Platelets: 173 10*3/uL (ref 150–400)
RBC: 2.58 MIL/uL — ABNORMAL LOW (ref 3.87–5.11)
RDW: 13.8 % (ref 11.5–15.5)
WBC: 8.2 10*3/uL (ref 4.0–10.5)
nRBC: 0.2 % (ref 0.0–0.2)

## 2020-11-28 LAB — BASIC METABOLIC PANEL
Anion gap: 10 (ref 5–15)
BUN: 27 mg/dL — ABNORMAL HIGH (ref 8–23)
CO2: 20 mmol/L — ABNORMAL LOW (ref 22–32)
Calcium: 7.8 mg/dL — ABNORMAL LOW (ref 8.9–10.3)
Chloride: 106 mmol/L (ref 98–111)
Creatinine, Ser: 1.21 mg/dL — ABNORMAL HIGH (ref 0.44–1.00)
GFR, Estimated: 49 mL/min — ABNORMAL LOW (ref >60)
Glucose, Bld: 166 mg/dL — ABNORMAL HIGH (ref 70–99)
Potassium: 4.1 mmol/L (ref 3.5–5.1)
Sodium: 136 mmol/L (ref 135–145)

## 2020-11-28 LAB — TRIGLYCERIDES: Triglycerides: 298 mg/dL — ABNORMAL HIGH (ref <150)

## 2020-11-28 MED ORDER — ACETAMINOPHEN 650 MG RE SUPP: 650.0000 mg | Freq: Four times a day (QID) | RECTAL | Status: DC | PRN

## 2020-11-28 MED ORDER — POLYVINYL ALCOHOL 1.4 % OP SOLN
1.0000 [drp] | Freq: Four times a day (QID) | OPHTHALMIC | Status: DC | PRN
  Filled 2020-11-28: qty 15

## 2020-11-28 MED ORDER — LORAZEPAM 2 MG/ML IJ SOLN: 2.0000 mg | INTRAMUSCULAR | Status: DC | PRN

## 2020-11-28 MED ORDER — FENTANYL BOLUS VIA INFUSION
100.0000 ug | INTRAVENOUS | Status: DC | PRN
  Filled 2020-11-28: qty 100

## 2020-11-28 MED ORDER — GLYCOPYRROLATE 1 MG PO TABS
1.0000 mg | ORAL_TABLET | ORAL | Status: DC | PRN
  Filled 2020-11-28: qty 1

## 2020-11-28 MED ORDER — GLYCOPYRROLATE 0.2 MG/ML IJ SOLN: 0.2000 mg | INTRAMUSCULAR | Status: DC | PRN

## 2020-11-28 MED ORDER — DEXTROSE 5 % IV SOLN: INTRAVENOUS | Status: DC

## 2020-11-28 MED ORDER — ORAL CARE MOUTH RINSE: 15.0000 mL | Freq: Two times a day (BID) | OROMUCOSAL | Status: DC

## 2020-11-28 MED ORDER — FENTANYL CITRATE PF 50 MCG/ML IJ SOSY: 50.0000 ug | PREFILLED_SYRINGE | INTRAMUSCULAR | Status: DC | PRN

## 2020-11-28 MED ORDER — ACETAMINOPHEN 325 MG PO TABS: 650.0000 mg | ORAL_TABLET | Freq: Four times a day (QID) | ORAL | Status: DC | PRN

## 2020-11-28 MED ORDER — FENTANYL 2500MCG IN NS 250ML (10MCG/ML) PREMIX INFUSION: 0.0000 ug/h | INTRAVENOUS | Status: DC

## 2020-12-06 ENCOUNTER — Ambulatory Visit: Payer: Medicare Other | Admitting: Infectious Diseases

## 2020-12-17 NOTE — Procedures (Signed)
Extubation Procedure Note  Patient Details:   Name: Sharon Norris DOB: August 19, 1955 MRN: [id number]   Airway Documentation:    Vent end date: 30-Nov-2020 Vent end time: 0950   Evaluation  O2 sats: currently acceptable Complications: No apparent complications Patient did tolerate procedure well. Bilateral Breath Sounds: Rhonchi   No Pt extubated to comfort care measures per physician order/family request. Pt suctioned via ETT and orally prior. Positive cuff leak. Upon extubation pt suctioned orally and bite block removed. Pt placed on 2L nasal cannula for comfort. Family remains at pts bedside at this time. RT will continue to monitor and be available as needed.   [name] 11-30-2020, 9:54 AM

## 2020-12-17 NOTE — Progress Notes (Signed)
Pt extubated per MD order and family request, family at bedside during extubation. Fentanyl maxed out at 440mg/hr, propofol stopped. No signs of pain noted. Family updated and needs met. No complaints noted.

## 2020-12-17 NOTE — Progress Notes (Signed)
NAME:  Sharon Norris, MRN:  [id number], DOB:  Oct 17, 1955, LOS: 5 ADMISSION DATE:  11/22/2020, CONSULTATION DATE:  12/10/2020 REFERRING MD:  Little Ishikawa, MD, CHIEF COMPLAINT:  PEA cardiac arrest   History of Present Illness:  [name] is a 65 year old lady with PMH significant for IVDA, hepatitis C, hypertension among other conditions.  It was reported that the patient had a witnessed cardiac arrest at home.  There were no bystander CPR. EMS arrived and found patient in PEA. POSC was archived after a total of 20 minutes of ACLS protocol.  Patient had transient ROSC at 10 minutes but lost pulses but regained it after a second 10 minutes of ACLS. Protocol.  She was intubated in the ED as she arrived with a [facility] airway.  As per the patient's grand daughter, the patient had complained of chest pain intermittently the day prior to presentation.    Pertinent  Medical History  IV cocaine abuse HIV disease hypertension  Significant Hospital Events: Including procedures, antibiotic start and stop dates in addition to other pertinent events   12/09/2020 Intubation; right femoral TLC; Left radial arterial line 10/9 Echo > EF 60-65%, severe LVH, G2DD. 10/11 > intermittent eye twitching / opening, EEG ordered 10/13 MRI and exam consistent with severe hypoxic brain injury, family made DNR and awaiting more visitors before likely transition to comfort care   Subjective   Continued TTM as spiking fevers, requiring sedation for vent dyssynchrony  Objective   Blood pressure (!) 117/56, pulse 85, temperature 99.3 F (37.4 C), resp. rate (!) 32, height '5\' 6"'$  (1.676 m), weight 99.6 kg, SpO2 93 %.    Vent Mode: PRVC FiO2 (%):  [70 %] 70 % Set Rate:  [32 bmp] 32 bmp Vt Set:  [470 mL] 470 mL PEEP:  [5 cmH20] 5 cmH20 Plateau Pressure:  [21 cmH20-24 cmH20] 24 cmH20   Intake/Output Summary (Last 24 hours) at 12/22/2020 [id number] Last data filed at 2020/12/22 0600 Gross per 24 hour  Intake 2482.01 ml   Output 1345 ml  Net 1137.01 ml    Filed Weights   11/26/20 0400 11/27/20 0500 12-22-20 0357  Weight: 93.3 kg 97.2 kg 99.6 kg    General:  critically ill-appearing F, intubated and unresponsive HEENT: MM pink/moist, pupils fixed and not responsive to light, 78m R, 149mL Neuro: examined on propofol and fentanyl, not responsive to voice or stimulation, no corneal reflexes, - gag though triggering breaths on Vent CV: s1s2 rrr, no m/r/g PULM:  on full vent support, rhonchi RLL, moderate thick, white secretions GI: soft, bsx4 active  Extremities: warm/dry, 2+ edema  Skin: no rashes or lesions     Assessment & Plan:   PEA Cardiac Arrest - Suspect cocaine related  Severe encephalopathy Echo with LVEF 60 to 65% and grade 2 diastolic dysfunction MRI showing multifocal bihemispheric cortical diffusion restriction consistent with anoxic injury EEG without epileptiform discharges, consistent with severe diffuse encephalopathy Hx of substance abuse with positive toxicology screen for cocaine Plan: -Multiple discussions with family yesterday, made DNR.  Waiting for more family to come to the bedside today then likely transition to full comfort care -Continue supportive care    Acute NSTEMI vs supply demand mismatch - Possibly cocaine related/induced  Plan:  -Continue Heparin infusion , ASA, Atorvastatin, Carvedilol in addition to comfort care -Appreciate cards assistance  Malignant hypertension  CT head negative Plan:  -Continue nicardipine infusion and wean, goal SBP ~140 -Continue oral amlodipine + HCTZ  Acute  hypoxic and hypercarbic respiratory failure secondary to cardiac arrest Likely pneumonia Respiratory culture showing gram-positive cocci,  P: -Continue Rocephin, culture growing peptostreptococcus -Maintain full vent support, no SAT/SBT secondary to mental status -titrate Vent setting to maintain SpO2 greater than or equal to 90%. -HOB elevated 30 degrees. -Plateau  pressures less than 30 cm H20.  -Follow chest x-ray, ABG prn.   -Bronchial hygiene and RT/bronchodilator protocol.  Acute kidney injury superimposed on CKD 3b NAGMA Plan:  -Continue to follow UOP, BMP    Hx HIV disease, hepatitis C P: -continue HAART for now   Best Practice (right click and "Reselect all SmartList Selections" daily)   Diet/type: tubefeeds DVT prophylaxis: systemic heparin GI prophylaxis: PPI Lines: Arterial Line, Foley:  Yes, and it is still needed Code Status:  DNR Last date of multidisciplinary goals of care: Granddaughter updated at the bedside, more family coming in today  CRITICAL CARE Performed by: Otilio Carpen Amias Hutchinson   Total critical care time: 35 minutes  Critical care time was exclusive of separately billable procedures and treating other patients.  Critical care was necessary to treat or prevent imminent or life-threatening deterioration.  Critical care was time spent personally by me on the following activities: development of treatment plan with patient and/or surrogate as well as nursing, discussions with consultants, evaluation of patient's response to treatment, examination of patient, obtaining history from patient or surrogate, ordering and performing treatments and interventions, ordering and review of laboratory studies, ordering and review of radiographic studies, pulse oximetry and re-evaluation of patient's condition.  Otilio Carpen Morelia Cassells, PA-C [facility] See Amion for pager If no response to pager , please call [phone] until 7pm After 7:00 pm call Elink  [phone]?[place]

## 2020-12-17 NOTE — Progress Notes (Signed)
Patient expired at 1100, pronounced by me and Production assistant, radio. Family at bedside, emotional support given, family declined chaplin. No BP, No palpable pulse, no spontaneous respirations, asystole on the monitor, pupils fixed and non reactive. No response to pain. MD notified, charge nurse notified.

## 2020-12-17 NOTE — Progress Notes (Signed)
[place] received PG from unit secretary requesting potential support for family in the wake of pt.'s passing this AM; [place] spoke briefly w/family member at doorway but did not enter room per info from RN and Financial controller that family declined chaplain support.  Chaplains remain available as needed.  Lindaann Pascal, Chaplain Pager: [phone]

## 2020-12-17 NOTE — Death Summary Note (Signed)
Death Summary  Yaniyah Gaitor [id number] DOB: 02-22-1955 DOA: 12/21/2020  PCP: Scarlett Presto, MD  Admit date: 2020/12/21 Date of Death: 12/26/20 Time of Death: 1098/06/01 Notification: Scarlett Presto, MD notified of death of 2020-12-26   History of present illness:  Prachi Assink is a 65 y.o. female with a history of  has a past medical history of Anxiety, Bronchitis, Depression, Excessive cerumen in both ear canals (07/02/2017), H/O drug abuse ([place]), Hepatitis, History of suicidal ideation, HIV infection ([place]), Hypertension, Neuropathy, Sleep difficulties, and Trichomonal infection. IVDA who presented with complaint of witnessed cardiac arrest at home.   It was reported that the patient had a witnessed cardiac arrest at home.  There were no bystander CPR. EMS arrived and found patient in PEA. POSC was archived after a total of 20 minutes of ACLS protocol.  Patient had transient ROSC at 10 minutes but lost pulses but regained it after a second 10 minutes of ACLS. Protocol.  She was intubated in the emergency department. Workup revealed likely NSTEMI, malignant HTN, cocaine +, acute on chronic kidney failure, acidosis and Pneumonia.   Jari Pigg did not improve after supportive care with ventilator, targeted temperature control, blood pressure management. A brain MRI showed multifocal cortical restricted diffusion consistent with anoxic injury.  Her family opted to transition to comfort care on 12/27/2022 and patient passed peacefully.   Code Status= DNR  Brief Summary/Hospital Stay:  Patient presented on 2020-12-21 after a cardiac arrest without bystander CPR and found to be in PEA arrest, she was intubated, lines placed, and admitted to the ICU.    Cardiology was consulted and EEG ordered, pt was treated with cardene gtt, asa and heparin gtt.   An initial head CT was negative.   Despite supportive care, she did not regain consciousness, no seizure activity on EEG and MRI consistent with anoxic  injury.   With sedation wean she had tremoring and vent dyssynchrony without purposeful movement.   On 12/26/2020 after family discussion the patient was transitioned to comfort care and passed peacefully.    On physical exam: Patient was unresponsive to verbal and physical stimuli.  No gag, corneal, pupillary reflex was present.  Heart sounds and breath sounds were absent.  Final/Principal Diagnoses:  1.   PEA Cardiac Arrest  2. Anoxic brain injury 3.Acute on Chronic Kidney Injury 4. Malignant Hypertension 5. Acute Hypoxic Respiratory Failure 6 Metabolic Acidosis 7. Pneumonia  8. NSTEMI 9. Cocaine abuse   Disposition/Follow up Care: Patient is deceased.   Discharge medications: None  The results of significant diagnostics from this hospitalization (including imaging, microbiology, ancillary and laboratory) are listed below for reference.    Significant Diagnostic Studies: CT HEAD WO CONTRAST (5MM)  Result Date: December 21, 2020 CLINICAL DATA:  Altered mental status EXAM: CT HEAD WITHOUT CONTRAST TECHNIQUE: Contiguous axial images were obtained from the base of the skull through the vertex without intravenous contrast. COMPARISON:  11/21/2019 FINDINGS: Brain: Normal anatomic configuration. Parenchymal volume loss is commensurate with the patient's age. Remote lacunar infarct noted within the left basal ganglia, unchanged. No abnormal intra or extra-axial mass lesion or fluid collection. No abnormal mass effect or midline shift. No evidence of acute intracranial hemorrhage or infarct. Ventricular size is normal. Cerebellum unremarkable. Vascular: No asymmetric hyperdense vasculature at the skull base. Skull: Intact Sinuses/Orbits: There is dense opacification of the right maxillary sinus, new since prior examination. Remaining paranasal sinuses are clear. Orbits are unremarkable. Other: Mastoid air cells and middle ear cavities are clear. IMPRESSION:  No acute intracranial abnormality. Remote  lacunar infarct as described above. Right maxillary sinus disease, new since prior examination. Electronically Signed   By: Fidela Salisbury M.D.   On: 11/21/2020 23:28   CT Angio Chest PE W and/or Wo Contrast  Result Date: 12/13/2020 CLINICAL DATA:  PE suspected, high prob. Dyspnea, altered mental status EXAM: CT ANGIOGRAPHY CHEST WITH CONTRAST TECHNIQUE: Multidetector CT imaging of the chest was performed using the standard protocol during bolus administration of intravenous contrast. Multiplanar CT image reconstructions and MIPs were obtained to evaluate the vascular anatomy. CONTRAST:  56m OMNIPAQUE IOHEXOL 350 MG/ML SOLN COMPARISON:  06/21/2020 FINDINGS: Cardiovascular: There is suboptimal opacification of the pulmonary arterial tree with preferential opacification of the aorta. The examination is adequate only for exclusion of a central pulmonary embolism involving the main, right and left, and lobar pulmonary arteries of which there is none. The segmental and subsegmental pulmonary arteries are not adequately opacified to definitively exclude the presence of small pulmonary emboli. The central pulmonary arteries are of normal caliber. There is hypoenhancement of the left ventricular apex and mid cavity and apical septum which may reflect changes of an acute myocardial infarction. This appears new since prior examination. Cardiac size within normal limits. No pericardial effusion. Mild atherosclerotic calcification within the thoracic aorta. No aortic aneurysm. Mediastinum/Nodes: No pathologic thoracic adenopathy. Nasogastric tube extends into the mid body of the stomach beyond the margin of the examination. Endotracheal tube is seen 2.5 cm above the carina. Lungs/Pleura: There is peribronchovascular pulmonary infiltrate and mild smooth septal thickening as well as scattered ground-glass pulmonary infiltrate all most in keeping with mild pulmonary edema, possibly cardiogenic in nature. Mild bibasilar  atelectasis. No pneumothorax. Trace bilateral pleural effusions are present. Upper Abdomen: Cholelithiasis without pericholecystic inflammatory change. No acute abnormality. Musculoskeletal: No acute bone abnormality. No lytic or blastic bone lesion. Review of the MIP images confirms the above findings. IMPRESSION: Technically limited examination without evidence of pulmonary embolism through the lobar pulmonary arterial tree. The segmental and subsegmental pulmonary arteries are not adequately opacified to exclude the presence of small pulmonary emboli. Hypoenhancement of the left ventricular apex and mid cavity and apical septal walls suspicious for myocardial ischemia and/or infarction. Correlation with cardiac enzymes and EKG examination is recommended for further evaluation. Mild peribronchovascular and interstitial pulmonary infiltrate most in keeping with mild pulmonary edema, likely cardiogenic in nature. Trace bilateral pleural effusions. Support tubes in appropriate position. Aortic Atherosclerosis (ICD10-I70.0). These results were called by telephone at the time of interpretation on 12/09/2020 at 11:21 pm [facility] who verbally acknowledged these results. Electronically Signed   By: AFidela SalisburyM.D.   On: 12/09/2020 23:23   MR BRAIN W WO CONTRAST  Result Date: 11/27/2020 CLINICAL DATA:  Seizure.  Status post cardiac arrest. EXAM: MRI HEAD WITHOUT AND WITH CONTRAST TECHNIQUE: Multiplanar, multiecho pulse sequences of the brain and surrounding structures were obtained without and with intravenous contrast. CONTRAST:  152mGADAVIST GADOBUTROL 1 MMOL/ML IV SOLN COMPARISON:  11/24/2019 FINDINGS: Brain: There is multifocal bihemispheric cortical diffusion restriction. There is an old left caudate small vessel infarct. No acute or chronic hemorrhage. There is multifocal hyperintense T2-weighted signal within the white matter. Parenchymal volume and CSF spaces are normal. The midline  structures are normal. The hippocampi are normal and symmetric in size and signal. The hypothalamus and mamillary bodies are normal. There is no cortical ectopia or dysplasia. No abnormal contrast enhancement. Vascular: Major flow voids are preserved. Skull and upper  cervical spine: Normal calvarium and skull base. Visualized upper cervical spine and soft tissues are normal. Sinuses/Orbits:Moderate paranasal sinus mucosal thickening with complete opacification of the right maxillary sinus. No mastoid or middle ear effusion. Normal orbits. IMPRESSION: 1. Multifocal bihemispheric cortical diffusion restriction consistent with hypoxic ischemic injury. No hemorrhage or mass effect. 2. No seizure etiology identified. Electronically Signed   By: Ulyses Jarred M.D.   On: 11/27/2020 01:02   DG Chest Port 1 View  Result Date: 11/27/2020 CLINICAL DATA:  Endotracheal tube placement.  Respiratory failure. EXAM: PORTABLE CHEST 1 VIEW COMPARISON:  November 26, 2020. FINDINGS: Stable cardiomediastinal silhouette. Endotracheal and nasogastric tubes are unchanged in position. Increased bibasilar opacities are noted, right greater than left, concerning for edema or atelectasis. Bony thorax is unremarkable. IMPRESSION: Stable support apparatus. Increased bibasilar opacities as noted above, concerning for edema or atelectasis. Electronically Signed   By: Marijo Conception M.D.   On: 11/27/2020 08:34   DG Chest Port 1 View  Result Date: 11/26/2020 CLINICAL DATA:  Respiratory failure ([place]) J96.90 (ICD-10-CM) EXAM: PORTABLE CHEST 1 VIEW COMPARISON:  November 23, 2020. FINDINGS: Endotracheal tube tip approximately 3.6 cm above the carina. Gastric tube courses below the diaphragm with the tip outside the field of view. Mild interstitial opacities and central pulmonary vascular congestion, mildly improved. Streaky right basilar opacities. No visible pleural effusions or pneumothorax on this semierect AP radiograph. IMPRESSION: 1. Mildly  improved interstitial opacities and central pulmonary vascular congestion, compatible with mild pulmonary edema. 2. Streaky right basilar opacities, most likely atelectasis. 3. Endotracheal tube tip approximately 3.6 cm above the carina. Electronically Signed   By: Margaretha Sheffield M.D.   On: 11/26/2020 08:55   DG Chest Portable 1 View  Result Date: 11/29/2020 CLINICAL DATA:  Intubated EXAM: PORTABLE CHEST 1 VIEW COMPARISON:  06/21/2020 FINDINGS: Single frontal view of the chest demonstrates endotracheal tube overlying tracheal air column tip midway between thoracic inlet and carina. Enteric catheter passes below diaphragm, coiled over the gastric fundus. Tip of catheter is excluded by collimation. Cardiac silhouette is stable. There is increased central vascular congestion with bilateral perihilar airspace disease greatest in the upper lobes. No effusion or pneumothorax. No acute bony abnormalities. IMPRESSION: 1. No complication after intubation. 2. Increased vascular congestion with bilateral perihilar airspace disease most consistent with edema. Electronically Signed   By: Randa Ngo M.D.   On: 11/17/2020 21:36   DG Abd Portable 1V  Result Date: 11/25/2020 CLINICAL DATA:  Feeding tube placement. EXAM: PORTABLE ABDOMEN - 1 VIEW COMPARISON:  None. FINDINGS: An enteric tube terminates in the midline of the central abdomen at the level of the mid to distal gastric body. No dilated loops of bowel are seen in the included portion of the abdomen. Mild bibasilar lung opacities likely reflect atelectasis. No acute osseous abnormality is seen. IMPRESSION: Enteric tube in the stomach. Electronically Signed   By: Logan Bores M.D.   On: 11/25/2020 10:16   EEG adult  Result Date: 11/26/2020 Lora Havens, MD     11/26/2020  1:33 PM Patient Name: Keyrin Watwood MRN: [id number] Epilepsy Attending: Lora Havens Referring Physician/Provider: Montey Hora, PA Date: 11/26/2020 Duration: 26.17 mins  Patient  history: 65yo F s/p cardiac arrest. EEG to evaluate for seizure  Level of alertness:  comatose  AEDs during EEG study: Propofol, LEV  Technical aspects: This EEG study was done with scalp electrodes positioned according to the 10-20 International system of electrode placement. Electrical activity was acquired at  a sampling rate of '500Hz'$  and reviewed with a high frequency filter of '70Hz'$  and a low frequency filter of '1Hz'$ . EEG data were recorded continuously and digitally stored.  Description: EEG showed continuous generalized low amplitude 2-3 Hz delta slowing. EEG was reactive noxious stimulation. Hyperventilation and photic stimulation were not performed.   Of note, study was technically difficult due to significant myogenic artifact.  ABNORMALITY - Continuous slow, generalized  IMPRESSION: This technically difficult study is suggestive of severe diffuse encephalopathy, nonspecific etiology but likely related to sedation,  anoxic/hypoxic brain injury. No  definite seizures or epileptiform discharges were seen throughout the recording.  Lora Havens   EEG adult  Result Date: 11/24/2020 Lora Havens, MD     11/24/2020  1:45 PM Patient Name: Mora Matousek MRN: [id number] Epilepsy Attending: Lora Havens Referring Physician/Provider: Dr June Leap Date: 11/24/2020 Duration: 36.05 mins Patient history: 65yo F s/p cardiac arrest. EEG to evaluate for seizure Level of alertness:  comatose AEDs during EEG study: Propofol Technical aspects: This EEG study was done with scalp electrodes positioned according to the 10-20 International system of electrode placement. Electrical activity was acquired at a sampling rate of '500Hz'$  and reviewed with a high frequency filter of '70Hz'$  and a low frequency filter of '1Hz'$ . EEG data were recorded continuously and digitally stored. Description: EEG showed continuous generalized low amplitude 2-3 Hz delta slowing. Generalized Spike and polyspikes were noted which were  quasi-periodic every 1-3 seconds. Eeg was reactive noxious stimulation. Hyperventilation and photic stimulation were not performed.   ABNORMALITY - Polyspikes, generalized - Continuous slow, generalized IMPRESSION: This study showed evidence of epileptogenicity with generalized onset as well as severe diffuse encephalopathy, nonspecific etiology but likely related to sedation,  anoxic/hypoxic brain injury. No  definite seizures were seen throughout the recording. If suspicion for ictal- interictal activity remains a concern, a prolonged study can be considered. Lora Havens   ECHOCARDIOGRAM COMPLETE  Result Date: 11/24/2020    ECHOCARDIOGRAM REPORT   Patient Name:   ZOYA ZOGG Date of Exam: 11/24/2020 Medical Rec #:  [id number]      Height:       66.0 in Accession #:    [id number]     Weight:       201.5 lb Date of Birth:  [birth year]       BSA:          2.006 m Patient Age:    75 years       BP:           118/60 mmHg Patient Gender: F              HR:           71 bpm. Exam Location:  Inpatient Procedure: 2D Echo Indications:     acute myocardial infarction  History:         Patient has prior history of Echocardiogram examinations, most                  recent 11/21/2019. CAD, chronic kidney disease. HIV.; Risk                  Factors:Hypertension.  Sonographer:     Johny Chess Referring Phys:  [id number] A RICHARDS Diagnosing Phys: Oswaldo Milian MD  Sonographer Comments: Echo performed with patient supine and on artificial respirator. IMPRESSIONS  1. Left ventricular ejection fraction, by estimation, is 60 to 65%. The left ventricle has normal function. The left ventricle has  no regional wall motion abnormalities. There is severe left ventricular hypertrophy. Left ventricular diastolic parameters  are consistent with Grade II diastolic dysfunction (pseudonormalization). Elevated left atrial pressure. LVOT flow acceleration.  2. Right ventricular systolic function is normal. The right  ventricular size is normal. There is moderately elevated pulmonary artery systolic pressure. The estimated right ventricular systolic pressure is [id number] mmHg.  3. The mitral valve is normal in structure. Trivial mitral valve regurgitation.  4. The aortic valve is tricuspid. Aortic valve regurgitation is not visualized. Mild aortic valve sclerosis is present, with no evidence of aortic valve stenosis.  5. The inferior vena cava is normal in size with greater than 50% respiratory variability, suggesting right atrial pressure of 3 mmHg. FINDINGS  Left Ventricle: Left ventricular ejection fraction, by estimation, is 60 to 65%. The left ventricle has normal function. The left ventricle has no regional wall motion abnormalities. The left ventricular internal cavity size was normal in size. There is  severe left ventricular hypertrophy. Left ventricular diastolic parameters are consistent with Grade II diastolic dysfunction (pseudonormalization). Elevated left atrial pressure. Right Ventricle: The right ventricular size is normal. No increase in right ventricular wall thickness. Right ventricular systolic function is normal. There is moderately elevated pulmonary artery systolic pressure. The tricuspid regurgitant velocity is 3.37 m/s, and with an assumed right atrial pressure of 3 mmHg, the estimated right ventricular systolic pressure is [id number] mmHg. Left Atrium: Left atrial size was normal in size. Right Atrium: Right atrial size was normal in size. Pericardium: There is no evidence of pericardial effusion. Mitral Valve: The mitral valve is normal in structure. Trivial mitral valve regurgitation. Tricuspid Valve: The tricuspid valve is normal in structure. Tricuspid valve regurgitation is mild. Aortic Valve: The aortic valve is tricuspid. Aortic valve regurgitation is not visualized. Mild aortic valve sclerosis is present, with no evidence of aortic valve stenosis. Pulmonic Valve: The pulmonic valve was not well visualized.  Pulmonic valve regurgitation is trivial. Aorta: The aortic root and ascending aorta are structurally normal, with no evidence of dilitation. Venous: The inferior vena cava is normal in size with greater than 50% respiratory variability, suggesting right atrial pressure of 3 mmHg. IAS/Shunts: The interatrial septum was not well visualized.  LEFT VENTRICLE PLAX 2D LVIDd:         4.30 cm     Diastology LVIDs:         2.30 cm     LV e' medial:    5.11 cm/s LV PW:         1.40 cm     LV E/e' medial:  21.5 LV IVS:        1.00 cm     LV e' lateral:   5.55 cm/s LVOT diam:     1.60 cm     LV E/e' lateral: 19.8 LVOT Area:     2.01 cm  LV Volumes (MOD) LV vol d, MOD A2C: 58.1 ml LV vol s, MOD A2C: 28.0 ml LV SV MOD A2C:     30.1 ml RIGHT VENTRICLE             IVC RV S prime:     13.30 cm/s  IVC diam: 1.10 cm TAPSE (M-mode): 1.8 cm LEFT ATRIUM             Index        RIGHT ATRIUM          Index LA diam:        4.20 cm 2.09 cm/m  RA Area:     9.76 cm LA Vol (A2C):   44.3 ml 22.08 ml/m  RA Volume:   18.70 ml 9.32 ml/m LA Vol (A4C):   59.6 ml 29.71 ml/m LA Biplane Vol: 54.6 ml 27.22 ml/m   AORTA Ao Asc diam: 2.70 cm MITRAL VALVE                TRICUSPID VALVE MV Area (PHT): 2.66 cm     TR Peak grad:   45.4 mmHg MV Decel Time: 285 msec     TR Vmax:        337.00 cm/s MV E velocity: 110.00 cm/s MV A velocity: 137.00 cm/s  SHUNTS MV E/A ratio:  0.80         Systemic Diam: 1.60 cm Oswaldo Milian MD Electronically signed by Oswaldo Milian MD Signature Date/Time: 11/24/2020/2:47:14 PM    Final (Updated)    VAS [place] LOWER EXTREMITY VENOUS (DVT)  Result Date: 11/27/2020  Lower Venous DVT Study Patient Name:  DASHAE PERINI  Date of Exam:   11/26/2020 Medical Rec #: [id number]       Accession #:    [id number] Date of Birth: 02/13/1956        Patient Gender: F Patient Age:   72 years Exam Location:  [facility] Procedure:      VAS [place] LOWER EXTREMITY VENOUS (DVT) Referring Phys: Ina Homes  --------------------------------------------------------------------------------  Indications: Swelling.  Limitations: Body habitus, poor ultrasound/tissue interface and ventilated, on arctic sun. Comparison Study: No prior study Performing Technologist: Maudry Mayhew MHA, RDMS, RVT, RDCS  Examination Guidelines: A complete evaluation includes B-mode imaging, spectral Doppler, color Doppler, and power Doppler as needed of all accessible portions of each vessel. Bilateral testing is considered an integral part of a complete examination. Limited examinations for reoccurring indications may be performed as noted. The reflux portion of the exam is performed with the patient in reverse Trendelenburg.  +---------+---------------+---------+-----------+----------+--------------+ RIGHT    CompressibilityPhasicitySpontaneityPropertiesThrombus Aging +---------+---------------+---------+-----------+----------+--------------+ FV Prox  Full                                                        +---------+---------------+---------+-----------+----------+--------------+ FV Mid   Full                                                        +---------+---------------+---------+-----------+----------+--------------+ FV DistalFull                                                        +---------+---------------+---------+-----------+----------+--------------+ POP      Full           Yes      Yes                                 +---------+---------------+---------+-----------+----------+--------------+ PTV      Full  Yes                                 +---------+---------------+---------+-----------+----------+--------------+ PERO     Full                    Yes                                 +---------+---------------+---------+-----------+----------+--------------+   Right Technical Findings: Not visualized segments include CFV, SFJ, PFV.   +---------+---------------+---------+-----------+----------+--------------+ LEFT     CompressibilityPhasicitySpontaneityPropertiesThrombus Aging +---------+---------------+---------+-----------+----------+--------------+ CFV      Full           Yes      Yes                                 +---------+---------------+---------+-----------+----------+--------------+ FV Prox  Full                                                        +---------+---------------+---------+-----------+----------+--------------+ FV Mid   Full                                                        +---------+---------------+---------+-----------+----------+--------------+ FV DistalFull                                                        +---------+---------------+---------+-----------+----------+--------------+ PTV      Full                    Yes                                 +---------+---------------+---------+-----------+----------+--------------+ PERO     Full                    Yes                                 +---------+---------------+---------+-----------+----------+--------------+   Left Technical Findings: Not visualized segments include SFJ, DFV, popliteal vein.   Summary: RIGHT: - There is no evidence of deep vein thrombosis in the lower extremity. However, portions of this examination were limited- see technologist comments above.  - No cystic structure found in the popliteal fossa.  LEFT: - There is no evidence of deep vein thrombosis in the lower extremity. However, portions of this examination were limited- see technologist comments above.  - No cystic structure found in the popliteal fossa.  *See table(s) above for measurements and observations. Electronically signed by Harold Barban MD on 11/27/2020 at 9:20:27 PM.    Final     Microbiology: Recent Results (from the past 240 hour(s))  Resp Panel by RT-PCR (Flu A&B,  Covid) Nasopharyngeal Swab     Status: None    Collection Time: 11/25/2020  8:53 PM   Specimen: Nasopharyngeal Swab; Nasopharyngeal(NP) swabs in vial transport medium  Result Value Ref Range Status   SARS Coronavirus 2 by RT PCR NEGATIVE NEGATIVE Final    Comment: (NOTE) SARS-CoV-2 target nucleic acids are NOT DETECTED.  The SARS-CoV-2 RNA is generally detectable in upper respiratory specimens during the acute phase of infection. The lowest concentration of SARS-CoV-2 viral copies this assay can detect is 138 copies/mL. A negative result does not preclude SARS-Cov-2 infection and should not be used as the sole basis for treatment or other patient management decisions. A negative result may occur with  improper specimen collection/handling, submission of specimen other than nasopharyngeal swab, presence of viral mutation(s) within the areas targeted by this assay, and inadequate number of viral copies(<138 copies/mL). A negative result must be combined with clinical observations, patient history, and epidemiological information. The expected result is Negative.  Fact Sheet for Patients:  EntrepreneurPulse.com.au  Fact Sheet for Healthcare Providers:  IncredibleEmployment.be  This test is no t yet approved or cleared by the [place] FDA and  has been authorized for detection and/or diagnosis of SARS-CoV-2 by FDA under an Emergency Use Authorization (EUA). This EUA will remain  in effect (meaning this test can be used) for the duration of the COVID-19 declaration under Section 564(b)(1) of the Act, 21 U.S.C.section 360bbb-3(b)(1), unless the authorization is terminated  or revoked sooner.       Influenza A by PCR NEGATIVE NEGATIVE Final   Influenza B by PCR NEGATIVE NEGATIVE Final    Comment: (NOTE) The Xpert Xpress SARS-CoV-2/FLU/RSV plus assay is intended as an aid in the diagnosis of influenza from Nasopharyngeal swab specimens and should not be used as a sole basis for treatment.  Nasal washings and aspirates are unacceptable for Xpert Xpress SARS-CoV-2/FLU/RSV testing.  Fact Sheet for Patients: EntrepreneurPulse.com.au  Fact Sheet for Healthcare Providers: IncredibleEmployment.be  This test is not yet approved or cleared by the [place] FDA and has been authorized for detection and/or diagnosis of SARS-CoV-2 by FDA under an Emergency Use Authorization (EUA). This EUA will remain in effect (meaning this test can be used) for the duration of the COVID-19 declaration under Section 564(b)(1) of the Act, 21 U.S.C. section 360bbb-3(b)(1), unless the authorization is terminated or revoked.  Performed at [facility], [place] [address] [place], [place] [address]   Blood culture (routine x 2)     Status: Abnormal   Collection Time: 11/29/2020  9:18 PM   Specimen: BLOOD  Result Value Ref Range Status   Specimen Description BLOOD GROIN  Final   Special Requests   Final    BOTTLES DRAWN AEROBIC AND ANAEROBIC Blood Culture results may not be optimal due to an inadequate volume of blood received in culture bottles   Culture  Setup Time GRAM POSITIVE COCCI ANAEROBIC BOTTLE ONLY   Final   Culture (A)  Final    PEPTOSTREPTOCOCCUS ASACCHAROLYTICUS Standardized susceptibility testing for this organism is not available. Performed at [facility], [place] [address] [place], [place] [address]    Report Status 11/27/2020 FINAL  Final  Blood Culture ID Panel (Reflexed)     Status: None   Collection Time: 12/15/2020  9:18 PM  Result Value Ref Range Status   Enterococcus faecalis NOT DETECTED NOT DETECTED Final   Enterococcus Faecium NOT DETECTED NOT DETECTED Final   Listeria monocytogenes NOT DETECTED NOT DETECTED  Final   Staphylococcus species NOT DETECTED NOT DETECTED Final   Staphylococcus aureus (BCID) NOT DETECTED NOT DETECTED Final   Staphylococcus epidermidis NOT DETECTED NOT DETECTED Final   Staphylococcus lugdunensis  NOT DETECTED NOT DETECTED Final   Streptococcus species NOT DETECTED NOT DETECTED Final   Streptococcus agalactiae NOT DETECTED NOT DETECTED Final   Streptococcus pneumoniae NOT DETECTED NOT DETECTED Final   Streptococcus pyogenes NOT DETECTED NOT DETECTED Final   A.calcoaceticus-baumannii NOT DETECTED NOT DETECTED Final   Bacteroides fragilis NOT DETECTED NOT DETECTED Final   Enterobacterales NOT DETECTED NOT DETECTED Final   Enterobacter cloacae complex NOT DETECTED NOT DETECTED Final   Escherichia coli NOT DETECTED NOT DETECTED Final   Klebsiella aerogenes NOT DETECTED NOT DETECTED Final   Klebsiella oxytoca NOT DETECTED NOT DETECTED Final   Klebsiella pneumoniae NOT DETECTED NOT DETECTED Final   Proteus species NOT DETECTED NOT DETECTED Final   Salmonella species NOT DETECTED NOT DETECTED Final   Serratia marcescens NOT DETECTED NOT DETECTED Final   Haemophilus influenzae NOT DETECTED NOT DETECTED Final   Neisseria meningitidis NOT DETECTED NOT DETECTED Final   Pseudomonas aeruginosa NOT DETECTED NOT DETECTED Final   Stenotrophomonas maltophilia NOT DETECTED NOT DETECTED Final   Candida albicans NOT DETECTED NOT DETECTED Final   Candida auris NOT DETECTED NOT DETECTED Final   Candida glabrata NOT DETECTED NOT DETECTED Final   Candida krusei NOT DETECTED NOT DETECTED Final   Candida parapsilosis NOT DETECTED NOT DETECTED Final   Candida tropicalis NOT DETECTED NOT DETECTED Final   Cryptococcus neoformans/gattii NOT DETECTED NOT DETECTED Final    Comment: Performed at [facility], [address], [place], [place] [address]  Blood culture (routine x 2)     Status: None   Collection Time: 11/20/2020 10:00 PM   Specimen: BLOOD  Result Value Ref Range Status   Specimen Description BLOOD GROIN  Final   Special Requests   Final    BOTTLES DRAWN AEROBIC AND ANAEROBIC Blood Culture adequate volume   Culture   Final    NO GROWTH 5 DAYS Performed at [facility], [place]  [address], [place], [place] [address]    Report Status 12-04-2020 FINAL  Final  MRSA Next Gen by PCR, Nasal     Status: None   Collection Time: 11/24/20 12:25 AM   Specimen: Nasal Mucosa; Nasal Swab  Result Value Ref Range Status   MRSA by PCR Next Gen NOT DETECTED NOT DETECTED Final    Comment: (NOTE) The GeneXpert MRSA Assay (FDA approved for NASAL specimens only), is one component of a comprehensive MRSA colonization surveillance program. It is not intended to diagnose MRSA infection nor to guide or monitor treatment for MRSA infections. Test performance is not FDA approved in patients less than 29 years old. Performed at [facility], [place] [address], [place], [place] [address]   Culture, Respiratory w Gram Stain     Status: None   Collection Time: 11/26/20 12:17 PM   Specimen: Tracheal Aspirate  Result Value Ref Range Status   Specimen Description TRACHEAL ASPIRATE  Final   Special Requests NONE  Final   Gram Stain   Final    ABUNDANT WBC PRESENT,BOTH PMN AND MONONUCLEAR MODERATE GRAM POSITIVE COCCI Performed at [facility], [address] [place], [place] [address]    Culture ABUNDANT STAPHYLOCOCCUS AUREUS  Final   Report Status 12-04-2020 FINAL  Final   Organism ID, Bacteria STAPHYLOCOCCUS AUREUS  Final  Susceptibility   Staphylococcus aureus - MIC*    CIPROFLOXACIN <=0.5 SENSITIVE Sensitive     ERYTHROMYCIN 0.5 SENSITIVE Sensitive     GENTAMICIN <=0.5 SENSITIVE Sensitive     OXACILLIN 0.5 SENSITIVE Sensitive     TETRACYCLINE <=1 SENSITIVE Sensitive     VANCOMYCIN 1 SENSITIVE Sensitive     TRIMETH/SULFA <=10 SENSITIVE Sensitive     CLINDAMYCIN <=0.25 SENSITIVE Sensitive     RIFAMPIN <=0.5 SENSITIVE Sensitive     Inducible Clindamycin NEGATIVE Sensitive     * ABUNDANT STAPHYLOCOCCUS AUREUS     Labs: Basic Metabolic Panel: Recent Labs  Lab 11/25/20 0417 11/25/20 0433 11/25/20 1422 11/25/20 1446 11/25/20 2051 11/25/20 2056 11/26/20 0413  11/26/20 0455 11/26/20 0614 11/27/20 0526 11/27/20 0530 Dec 06, 2020 0350  NA 138   < > 138   < > 136   < > 137 138 137 138 136 136  K 4.5   < > 4.6   < > 4.2   < > 4.2 4.2 4.2 3.7 3.8 4.1  CL 114*  --  114*  --  111  --  108  --   --   --  105 106  CO2 15*  --  16*  --  17*  --  20*  --   --   --  19* 20*  GLUCOSE 110*  --  97  --  156*  --  154*  --   --   --  231* 166*  BUN 19  --  17  --  19  --  20  --   --   --  20 27*  CREATININE 1.53*  --  1.41*  --  1.39*  --  1.36*  --   --   --  1.34* 1.21*  CALCIUM 7.9*  --  8.0*  --  7.8*  --  7.9*  --   --   --  7.8* 7.8*  MG 2.1  --  2.1  --  2.0  --  2.2  --   --   --  2.1  --   PHOS 4.6  --  4.3  --  4.3  --  4.2  --   --   --  3.0  --    < > = values in this interval not displayed.   Liver Function Tests: Recent Labs  Lab 11/24/20 1226 11/24/20 2018 11/25/20 0417 11/25/20 1422 11/25/20 2051  AST 80* 63* 57* 50* 50*  ALT '31 30 30 27 27  '$ ALKPHOS 62 57 62 61 59  BILITOT 0.2* 0.5 0.6 0.5 0.3  PROT 6.1* 6.1* 6.6 6.2* 6.0*  ALBUMIN 2.4* 2.4* 2.5* 2.4* 2.3*   No results for input(s): LIPASE, AMYLASE in the last 168 hours. No results for input(s): AMMONIA in the last 168 hours. CBC: Recent Labs  Lab 12/08/2020 2118 12/04/2020 2131 11/24/20 0343 11/24/20 1237 11/25/20 0417 11/25/20 0433 11/26/20 0413 11/26/20 0455 11/26/20 0614 11/27/20 0335 11/27/20 0526 12-06-2020 0350  WBC 18.6*  --  13.1*  --  13.8*  --  11.2*  --   --  10.1  --  8.2  NEUTROABS 16.0*  --   --   --   --   --   --   --   --   --   --   --   HGB 12.5   < > 11.5*   < > 10.9*   < > 9.9* 9.5* 10.5* 9.2* 9.2* 8.4*  HCT  39.4   < > 34.1*   < > 33.7*   < > 30.9* 28.0* 31.0* 27.6* 27.0* 25.3*  MCV 101.8*  --  96.3  --  100.3*  --  99.7  --   --  98.2  --  98.1  PLT 236  --  184  --  166  --  156  --   --  165  --  173   < > = values in this interval not displayed.   Cardiac Enzymes: No results for input(s): CKTOTAL, CKMB, CKMBINDEX, TROPONINI in the last 168  hours. D-Dimer No results for input(s): DDIMER in the last 72 hours. BNP: Invalid input(s): POCBNP CBG: Recent Labs  Lab 11/27/20 1555 11/27/20 1944 11/27/20 2342 27-Dec-2020 0347 12-27-20 0732  GLUCAP 130* 160* 162* 152* 141*   Anemia work up No results for input(s): VITAMINB12, FOLATE, FERRITIN, TIBC, IRON, RETICCTPCT in the last 72 hours. Urinalysis    Component Value Date/Time   COLORURINE STRAW (A) 11/22/2020 2210   APPEARANCEUR CLEAR 11/25/2020 2210   LABSPEC 1.009 11/24/2020 2210   PHURINE 6.0 12/14/2020 2210   GLUCOSEU 50 (A) 11/17/2020 2210   GLUCOSEU NEG mg/dL 12/12/2007 2026   HGBUR SMALL (A) 11/30/2020 2210   BILIRUBINUR NEGATIVE 11/22/2020 2210   KETONESUR NEGATIVE 12/16/2020 2210   PROTEINUR 100 (A) 12/13/2020 2210   UROBILINOGEN 1.0 05/25/2013 1940   NITRITE NEGATIVE 12/11/2020 2210   LEUKOCYTESUR NEGATIVE 11/29/2020 2210   Sepsis Labs Invalid input(s): PROCALCITONIN,  WBC,  LACTICIDVEN    Otilio Carpen Heyward Douthit, PA-C [facility] See Amion for pager If no response to pager , please call [phone] until 7pm After 7:00 pm call Elink  [phone]?[place]

## 2020-12-17 DEATH — deceased
